# Patient Record
Sex: Male | Born: 1940
Health system: Southern US, Academic
[De-identification: ages and names within clinical notes are randomized; demographics above are authoritative.]

## PROBLEM LIST (undated history)

## (undated) ENCOUNTER — Ambulatory Visit

## (undated) ENCOUNTER — Encounter

## (undated) ENCOUNTER — Ambulatory Visit: Payer: MEDICARE

## (undated) ENCOUNTER — Encounter: Attending: Hematology & Oncology | Primary: Hematology & Oncology

## (undated) ENCOUNTER — Telehealth

## (undated) ENCOUNTER — Ambulatory Visit: Payer: MEDICARE | Attending: Hematology & Oncology | Primary: Hematology & Oncology

## (undated) ENCOUNTER — Telehealth: Attending: Hematology & Oncology | Primary: Hematology & Oncology

## (undated) ENCOUNTER — Telehealth: Attending: Internal Medicine | Primary: Internal Medicine

## (undated) ENCOUNTER — Encounter: Attending: Primary Care | Primary: Primary Care

## (undated) ENCOUNTER — Encounter: Attending: Nurse Practitioner | Primary: Nurse Practitioner

## (undated) ENCOUNTER — Ambulatory Visit: Payer: MEDICARE | Attending: Internal Medicine | Primary: Internal Medicine

## (undated) ENCOUNTER — Encounter: Attending: Acute Care | Primary: Acute Care

## (undated) ENCOUNTER — Encounter: Attending: Pulmonary Disease | Primary: Pulmonary Disease

## (undated) DIAGNOSIS — T4145XA Adverse effect of unspecified anesthetic, initial encounter: Secondary | ICD-10-CM

## (undated) DIAGNOSIS — D469 Myelodysplastic syndrome, unspecified: Secondary | ICD-10-CM

## (undated) DIAGNOSIS — C801 Malignant (primary) neoplasm, unspecified: Secondary | ICD-10-CM

## (undated) DIAGNOSIS — R911 Solitary pulmonary nodule: Secondary | ICD-10-CM

## (undated) DIAGNOSIS — K5790 Diverticulosis of intestine, part unspecified, without perforation or abscess without bleeding: Secondary | ICD-10-CM

## (undated) DIAGNOSIS — K4090 Unilateral inguinal hernia, without obstruction or gangrene, not specified as recurrent: Secondary | ICD-10-CM

## (undated) DIAGNOSIS — T07XXXA Unspecified multiple injuries, initial encounter: Secondary | ICD-10-CM

## (undated) DIAGNOSIS — K224 Dyskinesia of esophagus: Secondary | ICD-10-CM

## (undated) DIAGNOSIS — M4800 Spinal stenosis, site unspecified: Secondary | ICD-10-CM

## (undated) DIAGNOSIS — B002 Herpesviral gingivostomatitis and pharyngotonsillitis: Secondary | ICD-10-CM

## (undated) DIAGNOSIS — I5032 Chronic diastolic (congestive) heart failure: Secondary | ICD-10-CM

## (undated) DIAGNOSIS — G4733 Obstructive sleep apnea (adult) (pediatric): Secondary | ICD-10-CM

## (undated) DIAGNOSIS — T8859XA Other complications of anesthesia, initial encounter: Secondary | ICD-10-CM

## (undated) DIAGNOSIS — M1990 Unspecified osteoarthritis, unspecified site: Secondary | ICD-10-CM

## (undated) DIAGNOSIS — G629 Polyneuropathy, unspecified: Secondary | ICD-10-CM

## (undated) DIAGNOSIS — C9200 Acute myeloblastic leukemia, not having achieved remission: Secondary | ICD-10-CM

## (undated) DIAGNOSIS — I10 Essential (primary) hypertension: Secondary | ICD-10-CM

## (undated) DIAGNOSIS — J449 Chronic obstructive pulmonary disease, unspecified: Secondary | ICD-10-CM

## (undated) HISTORY — PX: OTHER SURGICAL HISTORY: SHX169

## (undated) HISTORY — PX: EYE SURGERY: SHX253

## (undated) HISTORY — DX: Solitary pulmonary nodule: R91.1

## (undated) HISTORY — PX: PILONIDAL CYST / SINUS EXCISION: SUR543

## (undated) HISTORY — DX: Obstructive sleep apnea (adult) (pediatric): G47.33

## (undated) HISTORY — DX: Unspecified multiple injuries, initial encounter: T07.XXXA

## (undated) HISTORY — DX: Chronic diastolic (congestive) heart failure: I50.32

---
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
## 1997-11-28 HISTORY — PX: OTHER SURGICAL HISTORY: SHX169

## 1998-05-14 ENCOUNTER — Ambulatory Visit (HOSPITAL_COMMUNITY): Admission: RE | Admit: 1998-05-14 | Discharge: 1998-05-14 | Payer: Self-pay | Admitting: Gastroenterology

## 1998-09-15 ENCOUNTER — Ambulatory Visit (HOSPITAL_COMMUNITY): Admission: RE | Admit: 1998-09-15 | Discharge: 1998-09-15 | Payer: Self-pay | Admitting: Gastroenterology

## 1999-05-21 ENCOUNTER — Ambulatory Visit: Admission: RE | Admit: 1999-05-21 | Discharge: 1999-05-21 | Payer: Self-pay | Admitting: Gastroenterology

## 1999-07-14 ENCOUNTER — Ambulatory Visit (HOSPITAL_COMMUNITY): Admission: RE | Admit: 1999-07-14 | Discharge: 1999-07-14 | Payer: Self-pay | Admitting: Gastroenterology

## 1999-07-14 ENCOUNTER — Encounter: Payer: Self-pay | Admitting: Gastroenterology

## 2000-09-26 ENCOUNTER — Other Ambulatory Visit: Admission: RE | Admit: 2000-09-26 | Discharge: 2000-09-26 | Payer: Self-pay | Admitting: Urology

## 2000-09-26 ENCOUNTER — Encounter (INDEPENDENT_AMBULATORY_CARE_PROVIDER_SITE_OTHER): Payer: Self-pay

## 2001-02-02 ENCOUNTER — Encounter: Admission: RE | Admit: 2001-02-02 | Discharge: 2001-02-02 | Payer: Self-pay | Admitting: Gastroenterology

## 2001-02-02 ENCOUNTER — Encounter: Payer: Self-pay | Admitting: Gastroenterology

## 2001-05-04 ENCOUNTER — Encounter: Payer: Self-pay | Admitting: Internal Medicine

## 2001-05-04 ENCOUNTER — Encounter: Admission: RE | Admit: 2001-05-04 | Discharge: 2001-05-04 | Payer: Self-pay | Admitting: Internal Medicine

## 2001-09-11 ENCOUNTER — Ambulatory Visit (HOSPITAL_COMMUNITY): Admission: RE | Admit: 2001-09-11 | Discharge: 2001-09-11 | Payer: Self-pay | Admitting: *Deleted

## 2001-10-29 ENCOUNTER — Encounter: Payer: Self-pay | Admitting: Internal Medicine

## 2001-10-29 ENCOUNTER — Encounter: Admission: RE | Admit: 2001-10-29 | Discharge: 2001-10-29 | Payer: Self-pay | Admitting: Internal Medicine

## 2002-01-01 ENCOUNTER — Encounter: Admission: RE | Admit: 2002-01-01 | Discharge: 2002-01-01 | Payer: Self-pay | Admitting: Gastroenterology

## 2002-01-01 ENCOUNTER — Encounter: Payer: Self-pay | Admitting: Gastroenterology

## 2003-08-06 ENCOUNTER — Ambulatory Visit (HOSPITAL_COMMUNITY): Admission: RE | Admit: 2003-08-06 | Discharge: 2003-08-06 | Payer: Self-pay | Admitting: Gastroenterology

## 2003-08-06 ENCOUNTER — Encounter: Payer: Self-pay | Admitting: Gastroenterology

## 2005-05-03 ENCOUNTER — Ambulatory Visit (HOSPITAL_BASED_OUTPATIENT_CLINIC_OR_DEPARTMENT_OTHER): Admission: RE | Admit: 2005-05-03 | Discharge: 2005-05-03 | Payer: Self-pay | Admitting: Gastroenterology

## 2005-05-08 ENCOUNTER — Ambulatory Visit: Payer: Self-pay | Admitting: Internal Medicine

## 2005-05-10 ENCOUNTER — Ambulatory Visit: Payer: Self-pay | Admitting: Internal Medicine

## 2005-06-14 ENCOUNTER — Ambulatory Visit: Payer: Self-pay | Admitting: Internal Medicine

## 2005-10-12 ENCOUNTER — Ambulatory Visit: Payer: Self-pay | Admitting: Internal Medicine

## 2005-10-17 ENCOUNTER — Ambulatory Visit: Payer: Self-pay | Admitting: Cardiology

## 2006-03-29 ENCOUNTER — Encounter: Payer: Self-pay | Admitting: Gastroenterology

## 2006-04-19 ENCOUNTER — Encounter: Admission: RE | Admit: 2006-04-19 | Discharge: 2006-04-19 | Payer: Self-pay | Admitting: Gastroenterology

## 2006-10-10 ENCOUNTER — Ambulatory Visit: Payer: Self-pay | Admitting: Internal Medicine

## 2007-10-10 ENCOUNTER — Ambulatory Visit (HOSPITAL_COMMUNITY): Admission: RE | Admit: 2007-10-10 | Discharge: 2007-10-10 | Payer: Self-pay | Admitting: Internal Medicine

## 2007-10-10 ENCOUNTER — Ambulatory Visit: Payer: Self-pay | Admitting: Internal Medicine

## 2008-10-07 DIAGNOSIS — G4733 Obstructive sleep apnea (adult) (pediatric): Secondary | ICD-10-CM | POA: Insufficient documentation

## 2008-10-07 DIAGNOSIS — J984 Other disorders of lung: Secondary | ICD-10-CM

## 2008-10-08 ENCOUNTER — Ambulatory Visit: Payer: Self-pay | Admitting: Internal Medicine

## 2009-10-08 ENCOUNTER — Ambulatory Visit: Payer: Self-pay | Admitting: Internal Medicine

## 2009-10-13 ENCOUNTER — Ambulatory Visit (HOSPITAL_COMMUNITY): Admission: RE | Admit: 2009-10-13 | Discharge: 2009-10-13 | Payer: Self-pay | Admitting: Internal Medicine

## 2009-10-15 ENCOUNTER — Telehealth: Payer: Self-pay | Admitting: Internal Medicine

## 2009-10-15 ENCOUNTER — Encounter: Payer: Self-pay | Admitting: Internal Medicine

## 2009-10-16 ENCOUNTER — Telehealth: Payer: Self-pay | Admitting: Internal Medicine

## 2009-10-20 ENCOUNTER — Telehealth: Payer: Self-pay | Admitting: Internal Medicine

## 2010-10-06 ENCOUNTER — Ambulatory Visit: Payer: Self-pay | Admitting: Internal Medicine

## 2010-10-06 DIAGNOSIS — J209 Acute bronchitis, unspecified: Secondary | ICD-10-CM | POA: Insufficient documentation

## 2010-10-28 ENCOUNTER — Ambulatory Visit: Payer: Self-pay | Admitting: Internal Medicine

## 2010-12-19 ENCOUNTER — Encounter: Payer: Self-pay | Admitting: Gastroenterology

## 2010-12-28 NOTE — Miscellaneous (Signed)
Summary: Orders Update pft charges  Clinical Lists Changes  Orders: Added new Service order of Carbon Monoxide diffusing w/capacity (94720) - Signed Added new Service order of Lung Volumes (94240) - Signed Added new Service order of Spirometry (Pre & Post) (94060) - Signed 

## 2010-12-28 NOTE — Assessment & Plan Note (Signed)
Summary: 12 MONTHS/APC   Primary Provider/Referring Provider:  K. Husain  CC:  1 yr f/u - Wears cpap only with travel.  History of Present Illness: 10/08/08-OSA, thoracic kyphosis, lung nodule hates cpap- not using. Gets vasomotor rhinitis from mask air flow.[name] Keeps weight lower and wife says less snoring.. Original AHI 19. Tried oral appliance. Having C-spine nerve root problem R hand. Had lung nodule on CT 2006. Followed since w/out change on cxr.  October 08, 2009- OSA, hx lung nodule followed since 2006. Thoracic kyphosis Walks 3 miles 5 days/ week in woods and credits this for weight loss.  Wife still complains of snore and he will go downstairs to sleep, using cpap only if they are travelling and sharing a motel room. Wakes sometimes with dry mouth and we discussed mouth breathing during sleep. CXR- no evident nodule but he had a heavy cigarette pack-year hx and father died of lung cancer. had flu vax  October 06, 2010- OSA, hx lung nodule followed since 2006. Thoracic kyphosis Nurse-CC: 1 yr f/u - Wears cpap only with travel Back not different, but he is more aware of sense that his chest feels encased or restricted some.  We discussed his degenerative disk disease and hx of restriction from kyphosis. Denies cough or wheeze. Does sometimes have morning cough with thick mucus plugs.  Only uses CPAP with trravel when he needs to share a bedroom.  CT- 10/13/09- stable small nodules, stable distal esophageal thickening.   Preventive Screening-Counseling & Management  Alcohol-Tobacco     Smoking Status: quit     Packs/Day: 1.0     Year Quit: 1997     Pack years: 40  Current Medications (verified): 1)  Cpap 8 Advanced 2)  Aspir-Low 81 Mg Tbec (Aspirin) .... Take One By Mouth Once Daily  Allergies (verified): 1)  ! Versed  Social History: Packs/Day:  1.0 Pack years:  40  Review of Systems      See HPI       The patient complains of shortness of breath with activity and  productive cough.  The patient denies shortness of breath at rest, non-productive cough, coughing up blood, chest pain, irregular heartbeats, acid heartburn, indigestion, loss of appetite, weight change, abdominal pain, difficulty swallowing, sore throat, tooth/dental problems, headaches, nasal congestion/difficulty breathing through nose, sneezing, itching, rash, change in color of mucus, and fever.    Vital Signs:  Patient profile:   70 year old male Height:      68 inches Weight:      166.25 pounds BMI:     25.37 O2 Sat:      98 % on Room air Pulse rate:   60 / minute BP sitting:   122 / 62  (left arm) Cuff size:   regular  Vitals Entered By: Abigail Miyamoto RN (October 06, 2010 9:56 AM)  O2 Flow:  Room air  Physical Exam  Additional Exam:  General: A/Ox3; pleasant and cooperative, NAD, trim SKIN: no rash, lesions NODES: no lymphadenopathy HEENT: [place]/AT, EOM- WNL, Conjuctivae- clear, PERRLA, TM-WNL, Nose- clear, Throat- clear and wnl, Mallampati II NECK: Supple w/ fair ROM, JVD- none, normal carotid impulses w/o bruits Thyroid-  CHEST: Clear to P&A, HEART: RRR, no m/g/r heard ABDOMEN: Soft and nl; GMW:NUUV, nl pulses, no edema  NEURO: Grossly intact to observation      Impression & Recommendations:  Problem # 1:  OBSTRUCTIVE SLEEP APNEA (ICD-327.23)  Likely minimal. We discussed potential role of PAP devices for  assist to work of breathing  if his restrictive problems ever required.   Problem # 2:  PULMONARY NODULE (ICD-518.89)  Stable by CT with no concern.  This didn't address new problem dypnea and I will get PFT but suspect he is getting some stiffening of rib excursion.l \\Had  flu vax. He does have an old incentive spirometer.   Medications Added to Medication List This Visit: 1)  Aspir-low 81 Mg Tbec (Aspirin) .... Take one by mouth once daily  Other Orders: Est. Patient Level III (16109) Misc. Referral (Misc. Ref)  Patient Instructions: 1)  Please schedule  a follow-up appointment in 1 year. 2)  cc Dr Donette Larry 3)  Schedule PFT   Influenza Immunization History:    Influenza # 1:  Historical (08/11/2010)  Pneumovax Immunization History:    Pneumovax # 1:  Historical (10/10/2007)

## 2011-04-12 NOTE — Assessment & Plan Note (Signed)
Warren [name]                             PULMONARY OFFICE NOTE   Warren [name], Warren [name]                        MRN:          [id number]  DATE:10/10/2007                            DOB:          [birth year]    PULMONARY FOLLOWUP   PROBLEM LIST:  1. Obstructive sleep apnea/hypopnea.  2. History of lung nodules.  3. History of multiple trauma.   HISTORY:  A one-year followup.  He uses CPAP in frequently because the  mask irritates and seems to increase rhinorrhea.  He had tried an oral  appliance at first, but could not make it work.  He exercises regularly  and feels well, noticing stable mild early morning cough, which seems  trivial by description.  Has had flu vaccine.  No fevers, sweats, or  adenopathy.   MEDICATIONS:  1. Aspirin.  2. CPAP at 8 CWP.  3. Occasional Flonase.   DRUG INTOLERANCES:  VERSED.   OBJECTIVE:  Weight 179 pounds, BP 112/64, pulse 68, room air saturation  99%.  He looks very well.  Nasal airway is unobstructed.  There is no neck vein distension, thyromegaly, or stridor.  HEART:  Sounds are regular without murmur.  CHEST:  Clear.   IMPRESSION:  Very minimal chronic bronchitis causing some morning cough,  not enough to treat.  Lung nodule has been stable.  Obstructive sleep  apnea is minor.  He is keeping his weight down and does not seem to be  experiencing significant snoring or daytime sleepiness.  We discussed  this.  He is going to ask his wife more pointedly about her observation  of his sleep apnea.   PLAN:  1. We will try reducing CPAP to 7 CWP for comfort.  2. Chest x-ray.  3. Schedule return in 1 year.   ADDENDUM:  Chest x-ray report of October 10, 2007 returns for  comparison to his CT scan of October 17, 2005 describing mild thoracic  kyphosis, which is stable, with clear lungs, normal heart size,  postoperative change to the left shoulder.  No acute disease.     Clinton D. Maple Hudson, MD, [name], FACP  Electronically Signed    CDY/MedQ  DD: 10/14/2007  DT: 10/15/2007  Job #: [id number]   cc:   Tasia Catchings, M.D.

## 2011-04-15 NOTE — Procedures (Signed)
[place]  Patient:    [name], Warren Visit Number: [id number] MRN: [id number]          Service Type: END Location: ENDO Attending Physician:  Mingo Amber Dictated by:   Roosvelt Harps, M.D. Proc. Date: 09/11/01 Admit Date:  09/11/2001 Discharge Date: 09/11/2001   CC:         Genene Churn. Sherin Quarry, M.D.                           Procedure Report  PROCEDURE:  Anorectal manometry.  INDICATION:  Fecal incontinence.  FINDINGS:  Preprocedure examination revealed a normal perineum and digital rectal exam.  Patient appropriately contracted the sphincter when instructed. The anorectal manometry probe was placed in the rectum and pressure measurements taken on station withdrawal.  The maximal average resting tone reflective of internal sphincter pressure occurred 3 cm from the anal verge and was 95.5 mmHg, which is normal.  The maximal voluntary contraction pressure, which is reflective of the external sphincter, occurred 3 cm from the anal verge and was 399 mmHg, which is normal.  The patient was able to maintain a contraction voluntarily for 28.1 seconds, which was normal.  The sphincter length was 4 cm, which is normal.  The sphincter vectorgrams demonstrated essentially symmetric resting and squeeze pressures.  The rectoanoinhibitory reflex was present.  The patients threshold volume of sensation was 15 cc in the rectal balloon, which is normal, the cut-off being 20 cc.  He experienced his first urge to defecate at 150 cc, which is borderline, but he tolerated 250 cc in the rectal balloon.  IMPRESSION:  Essentially normal anorectal manometry.  The patients symptoms could be due to incomplete defecation or spastic, irritable bowel. Dictated by:   Roosvelt Harps, M.D. Attending Physician:  Mingo Amber DD:  09/24/01 TD:  09/25/01 Job: [id number]

## 2011-04-15 NOTE — Assessment & Plan Note (Signed)
 [facility]                               PULMONARY OFFICE NOTE   Warren [name], Warren [name]                        MRN:          [id number]  DATE:10/10/2006                            DOB:          11/13/1941    PULMONARY/SLEEP FOLLOWUP   PROBLEMS:  1. Obstructive sleep apnea/hypopnea.  2. History of lung nodules.  3. History of multiple trauma.   HISTORY:  He returns for 1-year followup, having done quite well.  He goes  on wilderness camping trips and mentions that [place] at [place]  altitude of 8500 feet, he developed a cough with exhalation that was enough  to wake him at night.  That resolved as he returned home.  He thought the  altitude was the problem but it sounds as if cigars and campfire smoke had  more to do with it.  Since then, he has been fine.  He continues without  problems, using CPAP at 8 CWP.  Has had flu shot.   MEDICATION:  1. Flonase used occasionally when needed.  2. CPAP at 8 CWP.   Drug intolerant of VERSED.   OBJECTIVE:  Weight 172 pounds, BP 112/74, pulse regular at 55, room air  saturation 98%.  He looks quite comfortable.  Nasal airway and chest are  clear.  Voice quality is normal with no stridor or neck vein distention.  Heart sounds are regular without murmur.  I find no adenopathy at the neck  or supraclavicular areas, no cyanosis or clubbing.  Chest x-ray done today  was reviewed before sent for over-read.  I see no nodules, an issue in the  past.  No active disease.   IMPRESSION:  1. Mild bronchitis on trip, resolved.  2. Stable obstructive sleep apnea.  3. Past history of lung nodule seen on chest CT most recently November      2006 and described as a stable right apical nodule with no change over      the past 4 years compatible with a benign entity such as an uncalcified      granuloma.  Two low attenuation lesions were in the liver, likely to be      cysts.  Spirometry from November 2006 showed  mild obstructive disease      with an FEV1/FVC ratio of 0.67 and slowing primarily in small airways.      There was some response to bronchodilator suggesting an asthma or      asthmatic bronchitis pattern.   PLAN:  1. We can provide a rescue albuterol inhaler before he next goes camping.  2. Avoid obvious respiratory irritants.  3. Continue CPAP.  4. Schedule return 1 year, earlier p.r.n.     Clinton D. Maple Hudson, MD, [name], FACP  Electronically Signed    CDY/MedQ  DD: 10/10/2006  DT: 10/11/2006  Job #: [id number]   cc:   Tasia Catchings, M.D.

## 2011-04-15 NOTE — Procedures (Signed)
NAME:  Warren [name], Warren [name]                 ACCOUNT NO.:  [id number]   MEDICAL RECORD NO.:  [id number]          PATIENT TYPE:  OUT   LOCATION:  SLEEP CENTER                 FACILITY:  [facility]   PHYSICIAN:  Clinton D. Maple Hudson, M.D. DATE OF BIRTH:  March 03, 1941   DATE OF STUDY:  05/03/2005                              NOCTURNAL POLYSOMNOGRAM   REFERRING PHYSICIAN:  Dr. Tasia Catchings   DATE OF STUDY:  May 03, 2005   INDICATION FOR STUDY:  Hypersomnia with sleep apnea.  Epworth Sleepiness  Score 8/24, BMI 25, weight 167 pounds.  A diagnostic NPSG on May 21, 1999  gave an RDI of 19 per hour.  He had been treated with an oral appliance.   SLEEP ARCHITECTURE:  Total sleep time 373 minutes with sleep efficiency 83%.  Stage I was 16%, stage II 58%, stages III and IV absent, REM 25% of total  sleep time.  Sleep latency 28 minutes, REM latency 81 minutes, awake after  sleep onset 49 minutes, arousal index 21.  No bedtime medications taken.   RESPIRATORY DATA:  Split-study protocol.  Respiratory disturbance index  (RDI, AHI) 34.4 obstructive events per hour indicating moderately severe  obstructive sleep apnea/hypopnea syndrome.  There were 59 obstructive apneas  and 36 hypopneas.  Events were not positional.  REM RDI 18.  The  technician's notes do not indicate that he was wearing his oral appliance.  CPAP was titrated to 9 CWP, RDI 1.8 per hour, using a medium Respironics  ComfortGel Nasal Mask with heated humidifier.  Control of events was  obtained at lower pressures but titration continued to 9 CWP because of  snoring and associated arousals.   OXYGEN DATA:  Moderate snoring with oxygen desaturation to a nadir of 85%  before CPAP.  After CPAP control saturation held 94-98% on room air.   CARDIAC DATA:  Normal sinus rhythm with occasional PVC.   MOVEMENT/PARASOMNIA:  A total of 254 limb jerks were recorded of which 17  were associated with arousal or awakening for a periodic limb movement with  arousal index of 2.7 per hour which is mildly increased.  This can be  reconsidered clinically after therapeutic control of sleep apnea.   IMPRESSION/RECOMMENDATION:  1.  Moderately severe obstructive sleep apnea/hypopnea syndrome, respiratory      disturbance index 34.4 per hour with moderate snoring and oxygen      desaturation to 85%.  2.  Successful continuous positive airway pressure titration to 9 CWP,      respiratory disturbance index 1.8 per hour,      using a medium Respironics ComfortGel Nasal Mask with heated humidifier.  3.  Frequent leg jerks with mild periodic limb movement with arousal      syndrome, 2.7 per hour.      Clinton D. Maple Hudson, M.D.  Diplomat    CDY/MEDQ  D:  05/08/2005 11:16:06  T:  05/08/2005 13:56:56  Job:  [id number]

## 2011-07-13 ENCOUNTER — Other Ambulatory Visit: Payer: Self-pay | Admitting: Internal Medicine

## 2011-07-13 DIAGNOSIS — M5412 Radiculopathy, cervical region: Secondary | ICD-10-CM

## 2011-07-21 ENCOUNTER — Other Ambulatory Visit: Payer: Self-pay

## 2011-08-09 ENCOUNTER — Ambulatory Visit
Admission: RE | Admit: 2011-08-09 | Discharge: 2011-08-09 | Disposition: A | Discharge status: Home / Self Care | Payer: Medicare Other | Source: Ambulatory Visit | Attending: Internal Medicine | Admitting: Internal Medicine

## 2011-08-09 DIAGNOSIS — M5412 Radiculopathy, cervical region: Secondary | ICD-10-CM

## 2011-08-10 ENCOUNTER — Other Ambulatory Visit: Payer: Self-pay

## 2011-08-24 ENCOUNTER — Ambulatory Visit: Payer: Medicare Other | Attending: Internal Medicine

## 2011-08-24 DIAGNOSIS — M25619 Stiffness of unspecified shoulder, not elsewhere classified: Secondary | ICD-10-CM | POA: Insufficient documentation

## 2011-08-24 DIAGNOSIS — R5381 Other malaise: Secondary | ICD-10-CM | POA: Insufficient documentation

## 2011-08-24 DIAGNOSIS — IMO0001 Reserved for inherently not codable concepts without codable children: Secondary | ICD-10-CM | POA: Insufficient documentation

## 2011-08-24 DIAGNOSIS — M6281 Muscle weakness (generalized): Secondary | ICD-10-CM | POA: Insufficient documentation

## 2011-08-24 DIAGNOSIS — R293 Abnormal posture: Secondary | ICD-10-CM | POA: Insufficient documentation

## 2011-09-01 ENCOUNTER — Ambulatory Visit: Payer: Medicare Other | Attending: Internal Medicine

## 2011-09-01 DIAGNOSIS — IMO0001 Reserved for inherently not codable concepts without codable children: Secondary | ICD-10-CM | POA: Insufficient documentation

## 2011-09-01 DIAGNOSIS — R293 Abnormal posture: Secondary | ICD-10-CM | POA: Insufficient documentation

## 2011-09-01 DIAGNOSIS — M25619 Stiffness of unspecified shoulder, not elsewhere classified: Secondary | ICD-10-CM | POA: Insufficient documentation

## 2011-09-01 DIAGNOSIS — M6281 Muscle weakness (generalized): Secondary | ICD-10-CM | POA: Insufficient documentation

## 2011-09-01 DIAGNOSIS — R5381 Other malaise: Secondary | ICD-10-CM | POA: Insufficient documentation

## 2011-09-06 ENCOUNTER — Ambulatory Visit: Payer: Medicare Other

## 2011-09-15 ENCOUNTER — Ambulatory Visit: Payer: Medicare Other

## 2011-09-27 ENCOUNTER — Encounter: Payer: Medicare Other | Admitting: Physical Therapy

## 2011-10-04 ENCOUNTER — Encounter: Payer: Self-pay | Admitting: Internal Medicine

## 2011-10-05 ENCOUNTER — Ambulatory Visit: Payer: Medicare Other | Admitting: Internal Medicine

## 2011-12-21 DIAGNOSIS — M159 Polyosteoarthritis, unspecified: Secondary | ICD-10-CM | POA: Diagnosis not present

## 2011-12-21 DIAGNOSIS — M67919 Unspecified disorder of synovium and tendon, unspecified shoulder: Secondary | ICD-10-CM | POA: Diagnosis not present

## 2011-12-21 DIAGNOSIS — M25519 Pain in unspecified shoulder: Secondary | ICD-10-CM | POA: Diagnosis not present

## 2012-01-03 DIAGNOSIS — M67919 Unspecified disorder of synovium and tendon, unspecified shoulder: Secondary | ICD-10-CM | POA: Diagnosis not present

## 2012-01-03 DIAGNOSIS — M719 Bursopathy, unspecified: Secondary | ICD-10-CM | POA: Diagnosis not present

## 2012-01-05 DIAGNOSIS — M719 Bursopathy, unspecified: Secondary | ICD-10-CM | POA: Diagnosis not present

## 2012-01-06 DIAGNOSIS — M67919 Unspecified disorder of synovium and tendon, unspecified shoulder: Secondary | ICD-10-CM | POA: Diagnosis not present

## 2012-01-11 DIAGNOSIS — M67919 Unspecified disorder of synovium and tendon, unspecified shoulder: Secondary | ICD-10-CM | POA: Diagnosis not present

## 2012-01-13 DIAGNOSIS — M719 Bursopathy, unspecified: Secondary | ICD-10-CM | POA: Diagnosis not present

## 2012-01-13 DIAGNOSIS — M67919 Unspecified disorder of synovium and tendon, unspecified shoulder: Secondary | ICD-10-CM | POA: Diagnosis not present

## 2012-01-16 DIAGNOSIS — M719 Bursopathy, unspecified: Secondary | ICD-10-CM | POA: Diagnosis not present

## 2012-01-17 DIAGNOSIS — S93499A Sprain of other ligament of unspecified ankle, initial encounter: Secondary | ICD-10-CM | POA: Diagnosis not present

## 2012-01-20 DIAGNOSIS — M67919 Unspecified disorder of synovium and tendon, unspecified shoulder: Secondary | ICD-10-CM | POA: Diagnosis not present

## 2012-01-20 DIAGNOSIS — M719 Bursopathy, unspecified: Secondary | ICD-10-CM | POA: Diagnosis not present

## 2012-01-21 DIAGNOSIS — J069 Acute upper respiratory infection, unspecified: Secondary | ICD-10-CM | POA: Diagnosis not present

## 2012-01-21 DIAGNOSIS — K5289 Other specified noninfective gastroenteritis and colitis: Secondary | ICD-10-CM | POA: Diagnosis not present

## 2012-01-25 DIAGNOSIS — M719 Bursopathy, unspecified: Secondary | ICD-10-CM | POA: Diagnosis not present

## 2012-02-08 DIAGNOSIS — L821 Other seborrheic keratosis: Secondary | ICD-10-CM | POA: Diagnosis not present

## 2012-02-08 DIAGNOSIS — D239 Other benign neoplasm of skin, unspecified: Secondary | ICD-10-CM | POA: Diagnosis not present

## 2012-02-08 DIAGNOSIS — L819 Disorder of pigmentation, unspecified: Secondary | ICD-10-CM | POA: Diagnosis not present

## 2012-02-08 DIAGNOSIS — Z85820 Personal history of malignant melanoma of skin: Secondary | ICD-10-CM | POA: Diagnosis not present

## 2012-04-04 DIAGNOSIS — L821 Other seborrheic keratosis: Secondary | ICD-10-CM | POA: Diagnosis not present

## 2012-04-27 DIAGNOSIS — H6590 Unspecified nonsuppurative otitis media, unspecified ear: Secondary | ICD-10-CM | POA: Diagnosis not present

## 2012-04-27 DIAGNOSIS — J029 Acute pharyngitis, unspecified: Secondary | ICD-10-CM | POA: Diagnosis not present

## 2012-05-16 DIAGNOSIS — Z85820 Personal history of malignant melanoma of skin: Secondary | ICD-10-CM | POA: Diagnosis not present

## 2012-05-16 DIAGNOSIS — L578 Other skin changes due to chronic exposure to nonionizing radiation: Secondary | ICD-10-CM | POA: Diagnosis not present

## 2012-05-16 DIAGNOSIS — L821 Other seborrheic keratosis: Secondary | ICD-10-CM | POA: Diagnosis not present

## 2012-05-16 DIAGNOSIS — D239 Other benign neoplasm of skin, unspecified: Secondary | ICD-10-CM | POA: Diagnosis not present

## 2012-05-24 DIAGNOSIS — M19049 Primary osteoarthritis, unspecified hand: Secondary | ICD-10-CM | POA: Diagnosis not present

## 2012-05-25 DIAGNOSIS — H02409 Unspecified ptosis of unspecified eyelid: Secondary | ICD-10-CM | POA: Diagnosis not present

## 2012-05-25 DIAGNOSIS — H524 Presbyopia: Secondary | ICD-10-CM | POA: Diagnosis not present

## 2012-05-25 DIAGNOSIS — H52209 Unspecified astigmatism, unspecified eye: Secondary | ICD-10-CM | POA: Diagnosis not present

## 2012-05-25 DIAGNOSIS — H2510 Age-related nuclear cataract, unspecified eye: Secondary | ICD-10-CM | POA: Diagnosis not present

## 2012-07-12 DIAGNOSIS — M19049 Primary osteoarthritis, unspecified hand: Secondary | ICD-10-CM | POA: Diagnosis not present

## 2012-07-18 DIAGNOSIS — Z0000 Encounter for general adult medical examination without abnormal findings: Secondary | ICD-10-CM | POA: Diagnosis not present

## 2012-07-18 DIAGNOSIS — C61 Malignant neoplasm of prostate: Secondary | ICD-10-CM | POA: Diagnosis not present

## 2012-07-18 DIAGNOSIS — Z1331 Encounter for screening for depression: Secondary | ICD-10-CM | POA: Diagnosis not present

## 2012-07-18 DIAGNOSIS — K5730 Diverticulosis of large intestine without perforation or abscess without bleeding: Secondary | ICD-10-CM | POA: Diagnosis not present

## 2012-07-25 DIAGNOSIS — Z8546 Personal history of malignant neoplasm of prostate: Secondary | ICD-10-CM | POA: Diagnosis not present

## 2012-07-25 DIAGNOSIS — N529 Male erectile dysfunction, unspecified: Secondary | ICD-10-CM | POA: Diagnosis not present

## 2012-07-27 DIAGNOSIS — H01009 Unspecified blepharitis unspecified eye, unspecified eyelid: Secondary | ICD-10-CM | POA: Diagnosis not present

## 2012-08-06 DIAGNOSIS — Z1211 Encounter for screening for malignant neoplasm of colon: Secondary | ICD-10-CM | POA: Diagnosis not present

## 2012-09-05 DIAGNOSIS — Z23 Encounter for immunization: Secondary | ICD-10-CM | POA: Diagnosis not present

## 2012-09-06 DIAGNOSIS — M19049 Primary osteoarthritis, unspecified hand: Secondary | ICD-10-CM | POA: Diagnosis not present

## 2012-09-11 ENCOUNTER — Encounter: Payer: Self-pay | Admitting: Internal Medicine

## 2012-09-11 ENCOUNTER — Ambulatory Visit (INDEPENDENT_AMBULATORY_CARE_PROVIDER_SITE_OTHER): Payer: Medicare Other | Admitting: Internal Medicine

## 2012-09-11 VITALS — BP 122/76 | HR 70 | Ht 68.0 in | Wt 167.6 lb

## 2012-09-11 DIAGNOSIS — G4733 Obstructive sleep apnea (adult) (pediatric): Secondary | ICD-10-CM

## 2012-09-11 DIAGNOSIS — J984 Other disorders of lung: Secondary | ICD-10-CM | POA: Diagnosis not present

## 2012-09-11 DIAGNOSIS — R918 Other nonspecific abnormal finding of lung field: Secondary | ICD-10-CM | POA: Diagnosis not present

## 2012-09-11 DIAGNOSIS — R0602 Shortness of breath: Secondary | ICD-10-CM

## 2012-09-11 NOTE — Patient Instructions (Addendum)
Order noncontrast CT chest  Dx lung nodules

## 2012-09-11 NOTE — Progress Notes (Signed)
09/11/12- 71 yoM former smoker, followed for OSA, Hx lung nodule, hx dyspnea  Son is pulmonologist  still has slight SOB at times; wears CPAP8when traveling to prevent snoring. LOV-10/06/10 Has had flu vaccine. Stable dyspnea on exertion. Did  very well on a wilderness trip altitude and horse riding. He wanted to talk about screening CT scan for lung cancer. Father died of lung cancer. PFT 10-31-2010: Mild obstructive airways disease in small airways with insignificant response to bronchodilator. Increased diffusion capacity raised question of increased blood flow. FEV1 3.00/110%, FEV1/FVC 0.61, FEF 25-75% 1.23/49%. TLC 117%, DLCO 167%. CT chest 10/13/09 compared w/ 2006: IMPRESSION:  1. Stable small lung nodules.  2. No mediastinal or hilar adenopathy.  3. Stable hepatic cysts.  4. Mild distal esophageal wall thickening is a stable finding.  Provider: Niel Hummer, Amy Rogal   ROS-see HPI Constitutional:   No-   weight loss, night sweats, fevers, chills, fatigue, lassitude. HEENT:   No-  headaches, difficulty swallowing, tooth/dental problems, sore throat,       No-  sneezing, itching, ear ache, nasal congestion, post nasal drip,  CV:  No-   chest pain, orthopnea, PND, swelling in lower extremities, anasarca,  dizziness, palpitations Resp: + Little shortness of breath with exertion or at rest.              No-   productive cough,  No non-productive cough,  No- coughing up of blood.              No-   change in color of mucus.  No- wheezing.   Skin: No-   rash or lesions. GI:  No-   heartburn, indigestion, abdominal pain, nausea, vomiting,  GU:  MS:  No-   joint pain or swelling.   Neuro-     nothing unusual Psych:  No- change in mood or affect. No depression or anxiety.  No memory loss.  OBJ- Physical Exam General- Alert, Oriented, Affect-appropriate, Distress- none acute, trim Skin- rash-none, lesions- none, excoriation- none Lymphadenopathy- none Head- atraumatic            Eyes-  Gross vision intact, PERRLA, conjunctivae and secretions clear            Ears- Hearing, canals-normal            Nose- Clear, no-Septal dev, mucus, polyps, erosion, perforation             Throat- Mallampati II , mucosa clear , drainage- none, tonsils- atrophic Neck- flexible , trachea midline, no stridor , thyroid nl, carotid no bruit Chest - symmetrical excursion , unlabored           Heart/CV- RRR , no murmur , no gallop  , no rub, nl s1 s2                           - JVD- none , edema- none, stasis changes- none, varices- none           Lung- clear to P&A, wheeze- none, cough- none , dullness-none, rub- none           Chest wall- + mild scoliosis Abd- tender-no, distended-no, bowel sounds-present, HSM- no Br/ Gen/ Rectal- Not done, not indicated Extrem- cyanosis- none, clubbing, none, atrophy- none, strength- nl Neuro- grossly intact to observation

## 2012-09-13 DIAGNOSIS — M25559 Pain in unspecified hip: Secondary | ICD-10-CM | POA: Diagnosis not present

## 2012-09-13 DIAGNOSIS — M545 Low back pain: Secondary | ICD-10-CM | POA: Diagnosis not present

## 2012-09-18 ENCOUNTER — Ambulatory Visit (INDEPENDENT_AMBULATORY_CARE_PROVIDER_SITE_OTHER)
Admission: RE | Admit: 2012-09-18 | Discharge: 2012-09-18 | Disposition: A | Discharge status: Home / Self Care | Payer: Medicare Other | Source: Ambulatory Visit | Attending: Internal Medicine | Admitting: Internal Medicine

## 2012-09-18 DIAGNOSIS — R918 Other nonspecific abnormal finding of lung field: Secondary | ICD-10-CM | POA: Diagnosis not present

## 2012-09-20 ENCOUNTER — Telehealth: Payer: Self-pay | Admitting: Internal Medicine

## 2012-09-20 NOTE — Assessment & Plan Note (Signed)
He has used CPAP appropriately and has had some counseling from his son, a pulmonologist

## 2012-09-20 NOTE — Telephone Encounter (Signed)
I spoke with patient about results and he verbalized understanding and had no questions 

## 2012-09-20 NOTE — Assessment & Plan Note (Signed)
He had no discomfort riding horses on a high altitude wilderness trip and oxygenation is good now. I don't think this is much of a concern.

## 2012-09-20 NOTE — Assessment & Plan Note (Signed)
Former smoker with history of lung nodule. Plan-CT chest

## 2012-10-10 DIAGNOSIS — D692 Other nonthrombocytopenic purpura: Secondary | ICD-10-CM | POA: Diagnosis not present

## 2012-10-10 DIAGNOSIS — Z85820 Personal history of malignant melanoma of skin: Secondary | ICD-10-CM | POA: Diagnosis not present

## 2012-10-10 DIAGNOSIS — D2330 Other benign neoplasm of skin of unspecified part of face: Secondary | ICD-10-CM | POA: Diagnosis not present

## 2012-10-10 DIAGNOSIS — L821 Other seborrheic keratosis: Secondary | ICD-10-CM | POA: Diagnosis not present

## 2012-10-10 DIAGNOSIS — D239 Other benign neoplasm of skin, unspecified: Secondary | ICD-10-CM | POA: Diagnosis not present

## 2012-10-10 DIAGNOSIS — L723 Sebaceous cyst: Secondary | ICD-10-CM | POA: Diagnosis not present

## 2012-10-10 DIAGNOSIS — B351 Tinea unguium: Secondary | ICD-10-CM | POA: Diagnosis not present

## 2012-10-10 DIAGNOSIS — D1801 Hemangioma of skin and subcutaneous tissue: Secondary | ICD-10-CM | POA: Diagnosis not present

## 2012-10-18 DIAGNOSIS — M19049 Primary osteoarthritis, unspecified hand: Secondary | ICD-10-CM | POA: Diagnosis not present

## 2012-12-04 DIAGNOSIS — M159 Polyosteoarthritis, unspecified: Secondary | ICD-10-CM | POA: Diagnosis not present

## 2013-01-05 DIAGNOSIS — L0390 Cellulitis, unspecified: Secondary | ICD-10-CM | POA: Diagnosis not present

## 2013-01-05 DIAGNOSIS — L0291 Cutaneous abscess, unspecified: Secondary | ICD-10-CM | POA: Diagnosis not present

## 2013-01-05 DIAGNOSIS — B009 Herpesviral infection, unspecified: Secondary | ICD-10-CM | POA: Diagnosis not present

## 2013-01-14 DIAGNOSIS — L0291 Cutaneous abscess, unspecified: Secondary | ICD-10-CM | POA: Diagnosis not present

## 2013-01-30 DIAGNOSIS — R1011 Right upper quadrant pain: Secondary | ICD-10-CM | POA: Diagnosis not present

## 2013-02-27 ENCOUNTER — Other Ambulatory Visit: Payer: Self-pay | Admitting: Internal Medicine

## 2013-02-27 DIAGNOSIS — R229 Localized swelling, mass and lump, unspecified: Secondary | ICD-10-CM

## 2013-02-28 ENCOUNTER — Ambulatory Visit
Admission: RE | Admit: 2013-02-28 | Discharge: 2013-02-28 | Disposition: A | Discharge status: Home / Self Care | Payer: Medicare Other | Source: Ambulatory Visit | Attending: Internal Medicine | Admitting: Internal Medicine

## 2013-02-28 DIAGNOSIS — J438 Other emphysema: Secondary | ICD-10-CM | POA: Diagnosis not present

## 2013-02-28 DIAGNOSIS — R229 Localized swelling, mass and lump, unspecified: Secondary | ICD-10-CM

## 2013-02-28 MED ORDER — IOHEXOL 300 MG/ML  SOLN
75.0000 mL | Freq: Once | INTRAMUSCULAR | Status: AC | PRN
  Administered 2013-02-28: 75 mL via INTRAVENOUS

## 2013-03-22 DIAGNOSIS — B351 Tinea unguium: Secondary | ICD-10-CM | POA: Diagnosis not present

## 2013-03-22 DIAGNOSIS — D239 Other benign neoplasm of skin, unspecified: Secondary | ICD-10-CM | POA: Diagnosis not present

## 2013-03-22 DIAGNOSIS — L820 Inflamed seborrheic keratosis: Secondary | ICD-10-CM | POA: Diagnosis not present

## 2013-03-22 DIAGNOSIS — D485 Neoplasm of uncertain behavior of skin: Secondary | ICD-10-CM | POA: Diagnosis not present

## 2013-03-22 DIAGNOSIS — L821 Other seborrheic keratosis: Secondary | ICD-10-CM | POA: Diagnosis not present

## 2013-03-22 DIAGNOSIS — L608 Other nail disorders: Secondary | ICD-10-CM | POA: Diagnosis not present

## 2013-04-12 ENCOUNTER — Ambulatory Visit (INDEPENDENT_AMBULATORY_CARE_PROVIDER_SITE_OTHER): Payer: Self-pay | Admitting: Surgery

## 2013-04-26 DIAGNOSIS — G4733 Obstructive sleep apnea (adult) (pediatric): Secondary | ICD-10-CM | POA: Diagnosis not present

## 2013-04-26 DIAGNOSIS — K219 Gastro-esophageal reflux disease without esophagitis: Secondary | ICD-10-CM | POA: Diagnosis not present

## 2013-04-26 DIAGNOSIS — R030 Elevated blood-pressure reading, without diagnosis of hypertension: Secondary | ICD-10-CM | POA: Diagnosis not present

## 2013-07-23 DIAGNOSIS — Z0000 Encounter for general adult medical examination without abnormal findings: Secondary | ICD-10-CM | POA: Diagnosis not present

## 2013-07-23 DIAGNOSIS — Z136 Encounter for screening for cardiovascular disorders: Secondary | ICD-10-CM | POA: Diagnosis not present

## 2013-07-23 DIAGNOSIS — Z1331 Encounter for screening for depression: Secondary | ICD-10-CM | POA: Diagnosis not present

## 2013-07-23 DIAGNOSIS — K219 Gastro-esophageal reflux disease without esophagitis: Secondary | ICD-10-CM | POA: Diagnosis not present

## 2013-07-23 DIAGNOSIS — C61 Malignant neoplasm of prostate: Secondary | ICD-10-CM | POA: Diagnosis not present

## 2013-07-31 DIAGNOSIS — H2510 Age-related nuclear cataract, unspecified eye: Secondary | ICD-10-CM | POA: Diagnosis not present

## 2013-07-31 DIAGNOSIS — H02409 Unspecified ptosis of unspecified eyelid: Secondary | ICD-10-CM | POA: Diagnosis not present

## 2013-07-31 DIAGNOSIS — H52209 Unspecified astigmatism, unspecified eye: Secondary | ICD-10-CM | POA: Diagnosis not present

## 2013-09-02 ENCOUNTER — Other Ambulatory Visit: Payer: Self-pay | Admitting: Gastroenterology

## 2013-09-06 DIAGNOSIS — Z23 Encounter for immunization: Secondary | ICD-10-CM | POA: Diagnosis not present

## 2013-09-12 ENCOUNTER — Encounter: Payer: Self-pay | Admitting: Internal Medicine

## 2013-09-12 ENCOUNTER — Ambulatory Visit (INDEPENDENT_AMBULATORY_CARE_PROVIDER_SITE_OTHER): Payer: Medicare Other | Admitting: Internal Medicine

## 2013-09-12 VITALS — BP 128/64 | HR 74 | Ht 68.0 in | Wt 174.8 lb

## 2013-09-12 DIAGNOSIS — J984 Other disorders of lung: Secondary | ICD-10-CM | POA: Diagnosis not present

## 2013-09-12 DIAGNOSIS — J441 Chronic obstructive pulmonary disease with (acute) exacerbation: Secondary | ICD-10-CM

## 2013-09-12 DIAGNOSIS — G4733 Obstructive sleep apnea (adult) (pediatric): Secondary | ICD-10-CM | POA: Diagnosis not present

## 2013-09-12 DIAGNOSIS — I700 Atherosclerosis of aorta: Secondary | ICD-10-CM | POA: Diagnosis not present

## 2013-09-12 MED ORDER — VALACYCLOVIR HCL 500 MG PO TABS: ORAL_TABLET | ORAL | Status: DC

## 2013-09-12 NOTE — Patient Instructions (Signed)
Office spirometry- dx COPD 

## 2013-09-12 NOTE — Assessment & Plan Note (Signed)
Observation  

## 2013-09-12 NOTE — Progress Notes (Signed)
09/11/12- 71 yoM former smoker, followed for OSA, Hx lung nodule, hx dyspnea  Son is pulmonologist  still has slight SOB at times; wears CPAP8when traveling to prevent snoring. LOV-10/06/10 Has had flu vaccine. Stable dyspnea on exertion. Did  very well on a wilderness trip altitude and horse riding. He wanted to talk about screening CT scan for lung cancer. Father died of lung cancer.  PFT 11-08-2010: Mild obstructive airways disease in small airways with insignificant response to bronchodilator. Increased diffusion capacity raised question of increased blood flow. FEV1 3.00/110%, FEV1/FVC 0.61, FEF 25-75% 1.23/49%. TLC 117%, DLCO 167%. CT chest 10/13/09 compared w/ 2006: IMPRESSION:  1. Stable small lung nodules.  2. No mediastinal or hilar adenopathy.  3. Stable hepatic cysts.  4. Mild distal esophageal wall thickening is a stable finding.  Provider: Niel Hummer, Amy Rogal    09/12/13- 71 yoM former smoker, followed for OSA, Hx lung nodule, hx dyspnea, complicated by scoliosis,   Son is pulmonologist FOLLOWS FOR:sob same,no cough,had CT chest 03/18/13. Sister died w/ lymphoma CPAP 8/ Advanced- used more when travelling to control snoring- discussed.  On valacyclovir to suppress cold sores.  CT chest 02/27/13 IMPRESSION:  No subcutaneous lesion / mass at the site of the patient's palpable  abnormality (anterior right shoulder).  No evidence of metastatic disease in the chest.  Original Report Authenticated By: Charline Bills, M.D.   ROS-see HPI Constitutional:   No-   weight loss, night sweats, fevers, chills, fatigue, lassitude. HEENT:   No-  headaches, difficulty swallowing, tooth/dental problems, sore throat,       No-  sneezing, itching, ear ache, nasal congestion, post nasal drip,  CV:  No-   chest pain, orthopnea, PND, swelling in lower extremities, anasarca,  dizziness, palpitations Resp: + Little shortness of breath with exertion or at rest.              No-   productive  cough,  No non-productive cough,  No- coughing up of blood.              No-   change in color of mucus.  No- wheezing.   Skin: No-   rash or lesions. GI:  No-   heartburn, indigestion, abdominal pain, nausea, vomiting,  GU:  MS:  +  joint pain or swelling.   Neuro-     nothing unusual Psych:  No- change in mood or affect. No depression or anxiety.  No memory loss.  OBJ- Physical Exam General- Alert, Oriented, Affect-appropriate, Distress- none acute, trim Skin- rash-none, lesions- none, excoriation- none Lymphadenopathy- none Head- atraumatic            Eyes- Gross vision intact, PERRLA, conjunctivae and secretions clear            Ears- Hearing, canals-normal            Nose- Clear, no-Septal dev, mucus, polyps, erosion, perforation             Throat- Mallampati II , mucosa clear , drainage- none, tonsils- atrophic Neck- flexible , trachea midline, no stridor , thyroid nl, carotid no bruit Chest - symmetrical excursion , unlabored           Heart/CV- RRR , no murmur , no gallop  , no rub, nl s1 s2                           - JVD- none , edema- none, stasis changes- none, varices-  none           Lung- clear to P&A, wheeze- none, cough- none , dullness-none, rub- none           Chest wall- + mild scoliosis. +Lipoma R axilla Abd-  Br/ Gen/ Rectal- Not done, not indicated Extrem- cyanosis- none, clubbing, none, atrophy- none, strength- nl Neuro- grossly intact to observation

## 2013-09-13 ENCOUNTER — Encounter (HOSPITAL_COMMUNITY): Payer: Self-pay | Admitting: Pharmacy Technician

## 2013-09-17 ENCOUNTER — Encounter (HOSPITAL_COMMUNITY): Payer: Self-pay | Admitting: *Deleted

## 2013-09-25 DIAGNOSIS — L821 Other seborrheic keratosis: Secondary | ICD-10-CM | POA: Diagnosis not present

## 2013-09-25 DIAGNOSIS — D239 Other benign neoplasm of skin, unspecified: Secondary | ICD-10-CM | POA: Diagnosis not present

## 2013-09-25 DIAGNOSIS — Z85820 Personal history of malignant melanoma of skin: Secondary | ICD-10-CM | POA: Diagnosis not present

## 2013-09-25 DIAGNOSIS — D1801 Hemangioma of skin and subcutaneous tissue: Secondary | ICD-10-CM | POA: Diagnosis not present

## 2013-09-28 NOTE — Assessment & Plan Note (Addendum)
Controlled small airway obstruction Plan- no intervention

## 2013-09-28 NOTE — Assessment & Plan Note (Signed)
Ask Advanced for latest download

## 2013-09-28 NOTE — Assessment & Plan Note (Signed)
No intervention needed. 

## 2013-09-30 NOTE — Anesthesia Preprocedure Evaluation (Addendum)
Anesthesia Evaluation  Patient identified by MRN, date of birth, ID band Patient awake    Reviewed: Allergy & Precautions, H&P , NPO status , Patient's Chart, lab work & pertinent test results  Airway Mallampati: III TM Distance: >3 FB Neck ROM: full    Dental no notable dental hx. (+) Teeth Intact and Dental Advisory Given   Pulmonary sleep apnea , COPDformer smoker,  Mild OSA breath sounds clear to auscultation  Pulmonary exam normal       Cardiovascular Exercise Tolerance: Good negative cardio ROS  Rhythm:regular Rate:Normal     Neuro/Psych Acoustic neuroma left ear negative neurological ROS  negative psych ROS   GI/Hepatic negative GI ROS, Neg liver ROS,   Endo/Other  negative endocrine ROS  Renal/GU negative Renal ROS  negative genitourinary   Musculoskeletal   Abdominal   Peds  Hematology negative hematology ROS (+)   Anesthesia Other Findings   Reproductive/Obstetrics negative OB ROS                          Anesthesia Physical Anesthesia Plan  ASA: III  Anesthesia Plan: MAC   Post-op Pain Management:    Induction:   Airway Management Planned: Simple Face Mask  Additional Equipment:   Intra-op Plan:   Post-operative Plan:   Informed Consent: I have reviewed the patients History and Physical, chart, labs and discussed the procedure including the risks, benefits and alternatives for the proposed anesthesia with the patient or authorized representative who has indicated his/her understanding and acceptance.   Dental Advisory Given  Plan Discussed with: CRNA and Surgeon  Anesthesia Plan Comments:         Anesthesia Quick Evaluation

## 2013-10-01 ENCOUNTER — Encounter (HOSPITAL_COMMUNITY): Payer: Self-pay | Admitting: *Deleted

## 2013-10-01 ENCOUNTER — Ambulatory Visit (HOSPITAL_COMMUNITY)
Admission: RE | Admit: 2013-10-01 | Discharge: 2013-10-01 | Disposition: A | Discharge status: Home / Self Care | Payer: Medicare Other | Source: Ambulatory Visit | Attending: Gastroenterology | Admitting: Gastroenterology

## 2013-10-01 ENCOUNTER — Encounter (HOSPITAL_COMMUNITY)
Admission: RE | Disposition: A | Discharge status: Home / Self Care | Payer: Self-pay | Source: Ambulatory Visit | Attending: Gastroenterology

## 2013-10-01 ENCOUNTER — Ambulatory Visit (HOSPITAL_COMMUNITY): Payer: Medicare Other | Admitting: Anesthesiology

## 2013-10-01 ENCOUNTER — Encounter (HOSPITAL_COMMUNITY): Payer: Medicare Other | Admitting: Anesthesiology

## 2013-10-01 DIAGNOSIS — M1990 Unspecified osteoarthritis, unspecified site: Secondary | ICD-10-CM | POA: Diagnosis not present

## 2013-10-01 DIAGNOSIS — J449 Chronic obstructive pulmonary disease, unspecified: Secondary | ICD-10-CM | POA: Insufficient documentation

## 2013-10-01 DIAGNOSIS — N400 Enlarged prostate without lower urinary tract symptoms: Secondary | ICD-10-CM | POA: Insufficient documentation

## 2013-10-01 DIAGNOSIS — K5730 Diverticulosis of large intestine without perforation or abscess without bleeding: Secondary | ICD-10-CM | POA: Diagnosis not present

## 2013-10-01 DIAGNOSIS — Z1211 Encounter for screening for malignant neoplasm of colon: Secondary | ICD-10-CM | POA: Diagnosis not present

## 2013-10-01 DIAGNOSIS — Z8546 Personal history of malignant neoplasm of prostate: Secondary | ICD-10-CM | POA: Insufficient documentation

## 2013-10-01 DIAGNOSIS — Z9079 Acquired absence of other genital organ(s): Secondary | ICD-10-CM | POA: Insufficient documentation

## 2013-10-01 DIAGNOSIS — G4733 Obstructive sleep apnea (adult) (pediatric): Secondary | ICD-10-CM | POA: Insufficient documentation

## 2013-10-01 DIAGNOSIS — Z85820 Personal history of malignant melanoma of skin: Secondary | ICD-10-CM | POA: Diagnosis not present

## 2013-10-01 DIAGNOSIS — Z87891 Personal history of nicotine dependence: Secondary | ICD-10-CM | POA: Diagnosis not present

## 2013-10-01 DIAGNOSIS — I10 Essential (primary) hypertension: Secondary | ICD-10-CM | POA: Diagnosis not present

## 2013-10-01 DIAGNOSIS — J4489 Other specified chronic obstructive pulmonary disease: Secondary | ICD-10-CM | POA: Insufficient documentation

## 2013-10-01 HISTORY — DX: Chronic obstructive pulmonary disease, unspecified: J44.9

## 2013-10-01 HISTORY — DX: Spinal stenosis, site unspecified: M48.00

## 2013-10-01 HISTORY — DX: Herpesviral gingivostomatitis and pharyngotonsillitis: B00.2

## 2013-10-01 HISTORY — DX: Diverticulosis of intestine, part unspecified, without perforation or abscess without bleeding: K57.90

## 2013-10-01 HISTORY — DX: Essential (primary) hypertension: I10

## 2013-10-01 HISTORY — DX: Unspecified osteoarthritis, unspecified site: M19.90

## 2013-10-01 HISTORY — DX: Malignant (primary) neoplasm, unspecified: C80.1

## 2013-10-01 HISTORY — PX: COLONOSCOPY WITH PROPOFOL: SHX5780

## 2013-10-01 HISTORY — DX: Polyneuropathy, unspecified: G62.9

## 2013-10-01 HISTORY — DX: Dyskinesia of esophagus: K22.4

## 2013-10-01 SURGERY — COLONOSCOPY WITH PROPOFOL
Anesthesia: Monitor Anesthesia Care

## 2013-10-01 MED ORDER — SODIUM CHLORIDE 0.9 % IV SOLN: INTRAVENOUS | Status: DC

## 2013-10-01 MED ORDER — FENTANYL CITRATE 0.05 MG/ML IJ SOLN: 25.0000 ug | INTRAMUSCULAR | Status: DC | PRN

## 2013-10-01 MED ORDER — LACTATED RINGERS IV SOLN
INTRAVENOUS | Status: DC
  Administered 2013-10-01: 1000 mL via INTRAVENOUS
  Administered 2013-10-01: 08:00:00 via INTRAVENOUS

## 2013-10-01 MED ORDER — GLYCOPYRROLATE 0.2 MG/ML IJ SOLN
INTRAMUSCULAR | Status: DC | PRN
  Administered 2013-10-01: 0.2 mg via INTRAVENOUS

## 2013-10-01 MED ORDER — PROPOFOL INFUSION 10 MG/ML OPTIME
INTRAVENOUS | Status: DC | PRN
  Administered 2013-10-01: 140 ug/kg/min via INTRAVENOUS

## 2013-10-01 MED ORDER — PROPOFOL 10 MG/ML IV BOLUS
INTRAVENOUS | Status: DC | PRN
  Administered 2013-10-01: 50 mg via INTRAVENOUS
  Administered 2013-10-01: 30 mg via INTRAVENOUS

## 2013-10-01 MED ORDER — FENTANYL CITRATE 0.05 MG/ML IJ SOLN
INTRAMUSCULAR | Status: DC | PRN
  Administered 2013-10-01 (×2): 25 ug via INTRAVENOUS

## 2013-10-01 SURGICAL SUPPLY — 22 items

## 2013-10-01 NOTE — Preoperative (Signed)
Beta Blockers   Reason not to administer Beta Blockers:Not Applicable, not on home BB 

## 2013-10-01 NOTE — H&P (Signed)
  Procedure: Screening colonoscopy  History: The patient is a 72 year old male born 03-14-1941. On 08/19/2003, the patient underwent a normal screening colonoscopy.  The patient is scheduled to undergo a repeat screening colonoscopy today.  Past medical history: Osteoarthritis. Prostatic hypertrophy. Acoustic neuroma. Colonic diverticulosis. Obstructive sleep apnea syndrome. Prostate cancer. Systolic hypertension. Cervical spinal stenosis. Pilonidal cystectomy. Left cataract surgery. Nasal septum repair. Prostatectomy. Left elbow and shoulder surgery. Acoustic neuroma surgery. Superficial melanoma left shoulder surgery.  Allergies: Demerol causes confusion. Versed causes irritability.  Exam: The patient is alert and lying comfortably on the endoscopy stretcher. Abdomen is soft and nontender to palpation. Cardiac exam reveals a regular rhythm. Lungs are clear to auscultation.  Plan: Proceed with screening colonoscopy using propofol sedation.

## 2013-10-01 NOTE — Anesthesia Postprocedure Evaluation (Signed)
  Anesthesia Post-op Note  Patient: Warren [name]  Procedure(s) Performed: Procedure(s) (LRB): COLONOSCOPY WITH PROPOFOL (N/A)  Patient Location: PACU  Anesthesia Type: MAC  Level of Consciousness: awake and alert   Airway and Oxygen Therapy: Patient Spontanous Breathing  Post-op Pain: mild  Post-op Assessment: Post-op Vital signs reviewed, Patient's Cardiovascular Status Stable, Respiratory Function Stable, Patent Airway and No signs of Nausea or vomiting  Last Vitals:  Filed Vitals:   10/01/13 0842  BP: 134/76  Pulse: 60  Temp: 36.7 C  Resp: 18    Post-op Vital Signs: stable   Complications: No apparent anesthesia complications

## 2013-10-01 NOTE — Progress Notes (Signed)
Pt toolerating drinking coca cola

## 2013-10-01 NOTE — Transfer of Care (Signed)
Immediate Anesthesia Transfer of Care Note  Patient: Warren [name]  Procedure(s) Performed: Procedure(s): COLONOSCOPY WITH PROPOFOL (N/A)  Patient Location: PACU, ENDO  Anesthesia Type:MAC  Level of Consciousness: Patient easily awoken, sedated, comfortable, cooperative, following commands, responds to stimulation.   Airway & Oxygen Therapy: Patient spontaneously breathing, ventilating well, oxygen via simple oxygen mask.  Post-op Assessment: Report given to PACU RN, vital signs reviewed and stable, moving all extremities.   Post vital signs: Reviewed and stable.  Complications: No apparent anesthesia complications

## 2013-10-01 NOTE — Op Note (Signed)
Procedure: Screening colonoscopy. Normal screening colonoscopy in September 2004.  Endoscopist: Danise Edge  Premedication: Propofol administered by anesthesia  Procedure: The patient was placed in the left lateral decubitus position. Anal inspection and digital rectal exam were normal. Prostate was absent. The Pentax pediatric colonoscope was introduced into the rectum and advanced to the cecum. A normal-appearing ileocecal valve and appendiceal orifice were identified. Colonic preparation for the exam today was good.  Rectum. Normal. Retroflex view of the distal rectum normal  Sigmoid colon and descending colon. Left colonic diverticulosis  Splenic flexure. Normal  Transverse colon. Normal  Hepatic flexure. Normal  Ascending colon. Normal  Cecum and ileocecal valve. Normal  Assessment: Normal screening proctocolonoscopy to the cecum.

## 2013-10-02 ENCOUNTER — Encounter (HOSPITAL_COMMUNITY): Payer: Self-pay | Admitting: Gastroenterology

## 2013-10-07 ENCOUNTER — Ambulatory Visit: Payer: Medicare Other | Admitting: Interventional Cardiology

## 2013-10-09 DIAGNOSIS — L908 Other atrophic disorders of skin: Secondary | ICD-10-CM | POA: Diagnosis not present

## 2013-10-09 DIAGNOSIS — H02839 Dermatochalasis of unspecified eye, unspecified eyelid: Secondary | ICD-10-CM | POA: Diagnosis not present

## 2013-10-09 DIAGNOSIS — H02409 Unspecified ptosis of unspecified eyelid: Secondary | ICD-10-CM | POA: Diagnosis not present

## 2013-10-09 DIAGNOSIS — H11439 Conjunctival hyperemia, unspecified eye: Secondary | ICD-10-CM | POA: Diagnosis not present

## 2013-10-10 ENCOUNTER — Encounter: Payer: Self-pay | Admitting: Cardiology

## 2013-10-10 ENCOUNTER — Encounter: Payer: Self-pay | Admitting: *Deleted

## 2013-10-10 ENCOUNTER — Ambulatory Visit (INDEPENDENT_AMBULATORY_CARE_PROVIDER_SITE_OTHER): Payer: Medicare Other | Admitting: Cardiology

## 2013-10-10 VITALS — BP 138/80 | HR 61 | Ht 68.0 in | Wt 175.0 lb

## 2013-10-10 DIAGNOSIS — I2510 Atherosclerotic heart disease of native coronary artery without angina pectoris: Secondary | ICD-10-CM

## 2013-10-10 DIAGNOSIS — I10 Essential (primary) hypertension: Secondary | ICD-10-CM | POA: Insufficient documentation

## 2013-10-10 DIAGNOSIS — I700 Atherosclerosis of aorta: Secondary | ICD-10-CM | POA: Diagnosis not present

## 2013-10-10 DIAGNOSIS — I2584 Coronary atherosclerosis due to calcified coronary lesion: Secondary | ICD-10-CM | POA: Diagnosis not present

## 2013-10-10 NOTE — Patient Instructions (Signed)
Your physician recommends that you return for lab work in: Tomorrow morning for Fasting BMET, Lipid Panel, and Hepatic panel  Your physician has requested that you have cardiac CT. Cardiac computed tomography (CT) is a painless test that uses an x-ray machine to take clear, detailed pictures of your heart. For further information please visit [email or web address] Please follow instruction sheet as given.   Your physician wants you to follow-up in: 1 year with Dr. Sherlyn Lick will receive a reminder letter in the mail two months in advance. If you don't receive a letter, please call our office to schedule the follow-up appointment.
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
## 2013-10-11 ENCOUNTER — Other Ambulatory Visit (INDEPENDENT_AMBULATORY_CARE_PROVIDER_SITE_OTHER): Payer: Medicare Other

## 2013-10-11 DIAGNOSIS — I2584 Coronary atherosclerosis due to calcified coronary lesion: Secondary | ICD-10-CM

## 2013-10-11 DIAGNOSIS — I2510 Atherosclerotic heart disease of native coronary artery without angina pectoris: Secondary | ICD-10-CM

## 2013-10-11 LAB — HEPATIC FUNCTION PANEL
ALT: 13 U/L (ref 0–53)
Alkaline Phosphatase: 48 U/L (ref 39–117)
Bilirubin, Direct: 0.1 mg/dL (ref 0.0–0.3)
Total Protein: 6.1 g/dL (ref 6.0–8.3)

## 2013-10-11 LAB — LIPID PANEL
Cholesterol: 199 mg/dL (ref 0–200)
LDL Cholesterol: 114 mg/dL — ABNORMAL HIGH (ref 0–99)
Triglycerides: 102 mg/dL (ref 0.0–149.0)

## 2013-10-11 LAB — BASIC METABOLIC PANEL
BUN: 17 mg/dL (ref 6–23)
Calcium: 8.6 mg/dL (ref 8.4–10.5)
Creatinine, Ser: 1 mg/dL (ref 0.4–1.5)
GFR: 79.41 mL/min (ref >60.00)

## 2013-10-16 NOTE — Progress Notes (Signed)
lmtrc

## 2013-10-17 ENCOUNTER — Encounter: Payer: Self-pay | Admitting: General Surgery

## 2013-10-17 ENCOUNTER — Telehealth: Payer: Self-pay | Admitting: General Surgery

## 2013-10-17 ENCOUNTER — Telehealth: Payer: Self-pay | Admitting: Cardiology

## 2013-10-17 DIAGNOSIS — Z79899 Other long term (current) drug therapy: Secondary | ICD-10-CM

## 2013-10-17 DIAGNOSIS — E785 Hyperlipidemia, unspecified: Secondary | ICD-10-CM

## 2013-10-17 MED ORDER — SIMVASTATIN 10 MG PO TABS: 10.0000 mg | ORAL_TABLET | Freq: Every day | ORAL | Status: DC

## 2013-10-17 NOTE — Telephone Encounter (Signed)
Pt is aware. He will not be in town in 6 week until march. We scheduled him for 11/12/13.

## 2013-10-17 NOTE — Telephone Encounter (Signed)
New Problem:  Pt states he is calling for test results.

## 2013-10-17 NOTE — Telephone Encounter (Signed)
Spoke to pt and made aware.

## 2013-10-17 NOTE — Telephone Encounter (Signed)
Message copied by Nita Sells on Thu Oct 17, 2013 11:07 AM ------      Message from: Armanda Magic R      Created: Fri Oct 11, 2013  2:31 PM       Mildly elevated LDL with coronary artery calcifications on chest CT - please have patient start simvastatin 10mg  daily and recheck lipid panel and ALT in 6 weeks ------

## 2013-10-22 ENCOUNTER — Encounter (HOSPITAL_COMMUNITY): Payer: Self-pay

## 2013-10-22 ENCOUNTER — Ambulatory Visit (HOSPITAL_COMMUNITY)
Admission: RE | Admit: 2013-10-22 | Discharge: 2013-10-22 | Disposition: A | Discharge status: Home / Self Care | Payer: Medicare Other | Source: Ambulatory Visit | Attending: Cardiology | Admitting: Cardiology

## 2013-10-22 DIAGNOSIS — I2510 Atherosclerotic heart disease of native coronary artery without angina pectoris: Secondary | ICD-10-CM | POA: Insufficient documentation

## 2013-10-22 DIAGNOSIS — R0789 Other chest pain: Secondary | ICD-10-CM | POA: Diagnosis not present

## 2013-10-22 DIAGNOSIS — I701 Atherosclerosis of renal artery: Secondary | ICD-10-CM | POA: Insufficient documentation

## 2013-10-22 MED ORDER — NITROGLYCERIN 0.4 MG SL SUBL
SUBLINGUAL_TABLET | SUBLINGUAL | Status: AC
  Filled 2013-10-22: qty 25

## 2013-10-22 MED ORDER — METOPROLOL TARTRATE 1 MG/ML IV SOLN
5.0000 mg | Freq: Once | INTRAVENOUS | Status: AC
  Administered 2013-10-22: 5 mg via INTRAVENOUS
  Filled 2013-10-22: qty 5

## 2013-10-22 MED ORDER — NITROGLYCERIN 0.4 MG SL SUBL
0.4000 mg | SUBLINGUAL_TABLET | SUBLINGUAL | Status: DC | PRN
  Administered 2013-10-22: 0.4 mg via SUBLINGUAL
  Filled 2013-10-22: qty 25

## 2013-10-22 MED ORDER — IOHEXOL 350 MG/ML SOLN
80.0000 mL | Freq: Once | INTRAVENOUS | Status: AC | PRN
  Administered 2013-10-22: 80 mL via INTRAVENOUS

## 2013-10-22 MED ORDER — METOPROLOL TARTRATE 1 MG/ML IV SOLN
INTRAVENOUS | Status: AC
  Filled 2013-10-22: qty 5

## 2013-10-23 DIAGNOSIS — R079 Chest pain, unspecified: Secondary | ICD-10-CM

## 2013-10-31 ENCOUNTER — Telehealth: Payer: Self-pay | Admitting: Cardiology

## 2013-10-31 NOTE — Telephone Encounter (Signed)
Received request from Nurse fax box, documents faxed for surgical clearance. To: Oculofacial Surgery Fax number: [phone] Attention: 10/31/13/km

## 2013-11-12 ENCOUNTER — Other Ambulatory Visit: Payer: Medicare Other

## 2013-11-13 ENCOUNTER — Other Ambulatory Visit (INDEPENDENT_AMBULATORY_CARE_PROVIDER_SITE_OTHER): Payer: Medicare Other

## 2013-11-13 DIAGNOSIS — E785 Hyperlipidemia, unspecified: Secondary | ICD-10-CM

## 2013-11-13 DIAGNOSIS — Z79899 Other long term (current) drug therapy: Secondary | ICD-10-CM

## 2013-11-13 LAB — LIPID PANEL
Cholesterol: 138 mg/dL (ref 0–200)
HDL: 62.8 mg/dL (ref >39.00)
Triglycerides: 109 mg/dL (ref 0.0–149.0)
VLDL: 21.8 mg/dL (ref 0.0–40.0)

## 2013-11-13 LAB — ALT: ALT: 18 U/L (ref 0–53)

## 2014-02-26 DIAGNOSIS — H029 Unspecified disorder of eyelid: Secondary | ICD-10-CM | POA: Diagnosis not present

## 2014-02-26 DIAGNOSIS — E782 Mixed hyperlipidemia: Secondary | ICD-10-CM | POA: Diagnosis not present

## 2014-02-26 DIAGNOSIS — Z01818 Encounter for other preprocedural examination: Secondary | ICD-10-CM | POA: Diagnosis not present

## 2014-03-10 DIAGNOSIS — H02109 Unspecified ectropion of unspecified eye, unspecified eyelid: Secondary | ICD-10-CM | POA: Diagnosis not present

## 2014-03-10 DIAGNOSIS — H02139 Senile ectropion of unspecified eye, unspecified eyelid: Secondary | ICD-10-CM | POA: Diagnosis not present

## 2014-03-19 DIAGNOSIS — L980 Pyogenic granuloma: Secondary | ICD-10-CM | POA: Diagnosis not present

## 2014-03-19 DIAGNOSIS — S058X9A Other injuries of unspecified eye and orbit, initial encounter: Secondary | ICD-10-CM | POA: Diagnosis not present

## 2014-03-19 DIAGNOSIS — H02139 Senile ectropion of unspecified eye, unspecified eyelid: Secondary | ICD-10-CM | POA: Diagnosis not present

## 2014-03-19 DIAGNOSIS — H02819 Retained foreign body in unspecified eye, unspecified eyelid: Secondary | ICD-10-CM | POA: Diagnosis not present

## 2014-03-19 DIAGNOSIS — Z189 Retained foreign body fragments, unspecified material: Secondary | ICD-10-CM | POA: Diagnosis not present

## 2014-03-21 DIAGNOSIS — L919 Hypertrophic disorder of the skin, unspecified: Secondary | ICD-10-CM | POA: Diagnosis not present

## 2014-03-21 DIAGNOSIS — D239 Other benign neoplasm of skin, unspecified: Secondary | ICD-10-CM | POA: Diagnosis not present

## 2014-03-21 DIAGNOSIS — L820 Inflamed seborrheic keratosis: Secondary | ICD-10-CM | POA: Diagnosis not present

## 2014-03-21 DIAGNOSIS — Z85820 Personal history of malignant melanoma of skin: Secondary | ICD-10-CM | POA: Diagnosis not present

## 2014-03-21 DIAGNOSIS — L909 Atrophic disorder of skin, unspecified: Secondary | ICD-10-CM | POA: Diagnosis not present

## 2014-03-21 DIAGNOSIS — D485 Neoplasm of uncertain behavior of skin: Secondary | ICD-10-CM | POA: Diagnosis not present

## 2014-03-21 DIAGNOSIS — L819 Disorder of pigmentation, unspecified: Secondary | ICD-10-CM | POA: Diagnosis not present

## 2014-03-21 DIAGNOSIS — L570 Actinic keratosis: Secondary | ICD-10-CM | POA: Diagnosis not present

## 2014-03-21 DIAGNOSIS — L821 Other seborrheic keratosis: Secondary | ICD-10-CM | POA: Diagnosis not present

## 2014-03-24 DIAGNOSIS — L918 Other hypertrophic disorders of the skin: Secondary | ICD-10-CM | POA: Diagnosis not present

## 2014-03-24 DIAGNOSIS — H02419 Mechanical ptosis of unspecified eyelid: Secondary | ICD-10-CM | POA: Diagnosis not present

## 2014-03-24 DIAGNOSIS — L908 Other atrophic disorders of skin: Secondary | ICD-10-CM | POA: Diagnosis not present

## 2014-03-24 DIAGNOSIS — H02409 Unspecified ptosis of unspecified eyelid: Secondary | ICD-10-CM | POA: Diagnosis not present

## 2014-03-27 DIAGNOSIS — J029 Acute pharyngitis, unspecified: Secondary | ICD-10-CM | POA: Diagnosis not present

## 2014-03-27 DIAGNOSIS — J3489 Other specified disorders of nose and nasal sinuses: Secondary | ICD-10-CM | POA: Diagnosis not present

## 2014-03-28 NOTE — Telephone Encounter (Signed)
Please Close Encounter

## 2014-04-09 ENCOUNTER — Telehealth: Payer: Self-pay | Admitting: Cardiology

## 2014-04-09 MED ORDER — SIMVASTATIN 10 MG PO TABS: 10.0000 mg | ORAL_TABLET | Freq: Every day | ORAL | Status: DC

## 2014-04-09 NOTE — Telephone Encounter (Signed)
Called requesting refill on his Simvastatin 10 mg but would like a 90 day supply and is changing to new [facility] on [address] at [facility].  Called his current WM and they gave phone # as [phone].  Called his Rx in for 90 days of simvastatin 10 mg.  Pharmacist states not in systems yet but will be listed as [facility] [phone] [address].

## 2014-04-09 NOTE — Telephone Encounter (Signed)
New message  Pt called to have his statin medication changed from 30 to 90 day supply. Please assist

## 2014-07-30 DIAGNOSIS — Z1331 Encounter for screening for depression: Secondary | ICD-10-CM | POA: Diagnosis not present

## 2014-07-30 DIAGNOSIS — Z23 Encounter for immunization: Secondary | ICD-10-CM | POA: Diagnosis not present

## 2014-07-30 DIAGNOSIS — J309 Allergic rhinitis, unspecified: Secondary | ICD-10-CM | POA: Diagnosis not present

## 2014-07-30 DIAGNOSIS — G4730 Sleep apnea, unspecified: Secondary | ICD-10-CM | POA: Diagnosis not present

## 2014-07-30 DIAGNOSIS — E782 Mixed hyperlipidemia: Secondary | ICD-10-CM | POA: Diagnosis not present

## 2014-07-30 DIAGNOSIS — J449 Chronic obstructive pulmonary disease, unspecified: Secondary | ICD-10-CM | POA: Diagnosis not present

## 2014-07-30 DIAGNOSIS — Z0000 Encounter for general adult medical examination without abnormal findings: Secondary | ICD-10-CM | POA: Diagnosis not present

## 2014-07-30 DIAGNOSIS — Z8546 Personal history of malignant neoplasm of prostate: Secondary | ICD-10-CM | POA: Diagnosis not present

## 2014-07-30 DIAGNOSIS — K219 Gastro-esophageal reflux disease without esophagitis: Secondary | ICD-10-CM | POA: Diagnosis not present

## 2014-08-01 DIAGNOSIS — H52209 Unspecified astigmatism, unspecified eye: Secondary | ICD-10-CM | POA: Diagnosis not present

## 2014-08-01 DIAGNOSIS — H2510 Age-related nuclear cataract, unspecified eye: Secondary | ICD-10-CM | POA: Diagnosis not present

## 2014-08-19 ENCOUNTER — Telehealth: Payer: Self-pay | Admitting: Internal Medicine

## 2014-08-19 NOTE — Telephone Encounter (Signed)
Please advise if pt can be worked in sooner? thanks

## 2014-08-19 NOTE — Telephone Encounter (Signed)
Please see if patient can come in on Wednesday 09-17-14 at 11:15am. If not please see what days and times work best for patient. Thanks.

## 2014-08-19 NOTE — Telephone Encounter (Signed)
Called spoke with pt. appt scheduled 10/21. Nothing further needed

## 2014-09-12 ENCOUNTER — Ambulatory Visit: Payer: Medicare Other | Admitting: Internal Medicine

## 2014-09-17 ENCOUNTER — Telehealth: Payer: Self-pay | Admitting: Internal Medicine

## 2014-09-17 ENCOUNTER — Encounter (INDEPENDENT_AMBULATORY_CARE_PROVIDER_SITE_OTHER): Payer: Self-pay

## 2014-09-17 ENCOUNTER — Encounter: Payer: Self-pay | Admitting: Internal Medicine

## 2014-09-17 ENCOUNTER — Ambulatory Visit: Payer: Medicare Other | Admitting: Internal Medicine

## 2014-09-17 VITALS — BP 158/90 | HR 64 | Ht 68.0 in | Wt 175.0 lb

## 2014-09-17 DIAGNOSIS — R0600 Dyspnea, unspecified: Secondary | ICD-10-CM

## 2014-09-17 NOTE — Telephone Encounter (Signed)
lmtcb to schedule pft for now

## 2014-09-17 NOTE — Progress Notes (Signed)
10/15/13Apr 14, [age] yoM former smoker, followed for OSA, Hx lung nodule, hx dyspnea  Son is pulmonologist  still has slight SOB at times; wears CPAP8when traveling to prevent snoring. LOV-10/06/10 Has had flu vaccine. Stable dyspnea on exertion. Did  very well on a wilderness trip altitude and horse riding. He wanted to talk about screening CT scan for lung cancer. Father died of lung cancer.  PFT 2010/11/10: Mild obstructive airways disease in small airways with insignificant response to bronchodilator. Increased diffusion capacity raised question of increased blood flow. FEV1 3.00/110%, FEV1/FVC 0.61, FEF 25-75% 1.23/49%. TLC 117%, DLCO 167%. CT chest 10/13/09 compared w/ 2005-03-11: IMPRESSION:  1. Stable small lung nodules.  2. No mediastinal or hilar adenopathy.  3. Stable hepatic cysts.  4. Mild distal esophageal wall thickening is a stable finding.  Provider: Lennice Sites, Amy Rogal    [birth year] yoM former smoker, followed for OSA, Hx lung nodule, hx dyspnea, complicated by scoliosis,   Son is pulmonologist FOLLOWS FOR:sob same,no cough,had CT chest 03-11-2013. Sister died w/ lymphoma CPAP 8/ [employer]- used more when travelling to control snoring- discussed.  On valacyclovir to suppress cold sores.  CT chest 02/27/13 IMPRESSION:  No subcutaneous lesion / mass at the site of the patient's palpable  abnormality (anterior right shoulder).  No evidence of metastatic disease in the chest.  Original Report Authenticated By: Julian Hy, M.D.  09/17/14- 5 yoM former smoker, followed for OSA, Hx lung nodule, hx dyspnea, complicated by scoliosis,   Son is pulmonologist FOLLOWS FOR: sob has not improved, no cough. CPAP 8/ [employer]- used more when travelling to control snoring- discussed.     ROS-see HPI Constitutional:   No-   weight loss, night sweats, fevers, chills, fatigue, lassitude. HEENT:   No-  headaches, difficulty swallowing, tooth/dental problems, sore throat,       No-  sneezing,  itching, ear ache, nasal congestion, post nasal drip,  CV:  No-   chest pain, orthopnea, PND, swelling in lower extremities, anasarca,  dizziness, palpitations Resp: + Little shortness of breath with exertion or at rest.              No-   productive cough,  No non-productive cough,  No- coughing up of blood.              No-   change in color of mucus.  No- wheezing.   Skin: No-   rash or lesions. GI:  No-   heartburn, indigestion, abdominal pain, nausea, vomiting,  GU:  MS:  +  joint pain or swelling.   Neuro-     nothing unusual Psych:  No- change in mood or affect. No depression or anxiety.  No memory loss.  OBJ- Physical Exam General- Alert, Oriented, Affect-appropriate, Distress- none acute, trim Skin- rash-none, lesions- none, excoriation- none Lymphadenopathy- none Head- atraumatic            Eyes- Gross vision intact, PERRLA, conjunctivae and secretions clear            Ears- Hearing, canals-normal            Nose- Clear, no-Septal dev, mucus, polyps, erosion, perforation             Throat- Mallampati II , mucosa clear , drainage- none, tonsils- atrophic Neck- flexible , trachea midline, no stridor , thyroid nl, carotid no bruit Chest - symmetrical excursion , unlabored           Heart/CV- RRR , no murmur , no gallop  ,  no rub, nl s1 s2                           - JVD- none , edema- none, stasis changes- none, varices- none           Lung- clear to P&A, wheeze- none, cough- none , dullness-none, rub- none           Chest wall- + mild scoliosis. +Lipoma R axilla Abd-  Br/ Gen/ Rectal- Not done, not indicated Extrem- cyanosis- none, clubbing, none, atrophy- none, strength- nl Neuro- grossly intact to observation

## 2014-09-17 NOTE — Patient Instructions (Addendum)
Sample Anoro inhaler    1 puff once daily   Let us know if you think this makes a useful difference in your breathing  Order- Schedule PFT   Dx Dyspnea on exertion  Please call if we can help

## 2014-09-19 NOTE — Telephone Encounter (Signed)
ATC x 2 [phone]  Voicemail is stating a different number-- [phone] This alternate number is not listed on patient chart as a contact.  No message left at this time.

## 2014-09-22 NOTE — Telephone Encounter (Signed)
PFT already scheduled for 09/24/14. Pt aware. Nothing further needed.

## 2014-09-23 ENCOUNTER — Other Ambulatory Visit: Payer: Self-pay | Admitting: Internal Medicine

## 2014-09-23 DIAGNOSIS — K219 Gastro-esophageal reflux disease without esophagitis: Secondary | ICD-10-CM | POA: Diagnosis not present

## 2014-09-23 DIAGNOSIS — I10 Essential (primary) hypertension: Secondary | ICD-10-CM | POA: Diagnosis not present

## 2014-09-24 ENCOUNTER — Ambulatory Visit (INDEPENDENT_AMBULATORY_CARE_PROVIDER_SITE_OTHER): Payer: Medicare Other | Admitting: Internal Medicine

## 2014-09-24 DIAGNOSIS — D225 Melanocytic nevi of trunk: Secondary | ICD-10-CM | POA: Diagnosis not present

## 2014-09-24 DIAGNOSIS — R0600 Dyspnea, unspecified: Secondary | ICD-10-CM | POA: Diagnosis not present

## 2014-09-24 DIAGNOSIS — D1801 Hemangioma of skin and subcutaneous tissue: Secondary | ICD-10-CM | POA: Diagnosis not present

## 2014-09-24 DIAGNOSIS — L820 Inflamed seborrheic keratosis: Secondary | ICD-10-CM | POA: Diagnosis not present

## 2014-09-24 DIAGNOSIS — L821 Other seborrheic keratosis: Secondary | ICD-10-CM | POA: Diagnosis not present

## 2014-09-24 DIAGNOSIS — B351 Tinea unguium: Secondary | ICD-10-CM | POA: Diagnosis not present

## 2014-09-24 LAB — PULMONARY FUNCTION TEST
DL/VA % pred: 98 %
DL/VA: 4.33 ml/min/mmHg/L
DLCO unc % pred: 102 %
DLCO unc: 28.99 ml/min/mmHg
FEF 25-75 Post: 2.06 L/sec
FEF 25-75 Pre: 1.48 L/sec
FEF2575-%CHANGE-POST: 39 %
FEF2575-%Pred-Post: 105 %
FEF2575-%Pred-Pre: 76 %
FEV1-%CHANGE-POST: 9 %
FEV1-%Pred-Post: 112 %
FEV1-%Pred-Pre: 102 %
FEV1-POST: 3.01 L
FEV1-Pre: 2.74 L
FEV1FVC-%Change-Post: 3 %
FEV1FVC-%Pred-Pre: 89 %
FEV6-%Change-Post: 6 %
FEV6-%Pred-Post: 124 %
FEV6-%Pred-Pre: 117 %
FEV6-PRE: 4.08 L
FEV6-Post: 4.33 L
FEV6FVC-%Change-Post: 0 %
FEV6FVC-%PRED-PRE: 104 %
FEV6FVC-%Pred-Post: 104 %
FVC-%Change-Post: 5 %
FVC-%Pred-Post: 119 %
FVC-%Pred-Pre: 112 %
FVC-POST: 4.44 L
FVC-Pre: 4.2 L
POST FEV1/FVC RATIO: 68 %
POST FEV6/FVC RATIO: 98 %
Pre FEV1/FVC ratio: 65 %
Pre FEV6/FVC Ratio: 97 %
RV % PRED: 110 %
RV: 2.64 L
TLC % pred: 107 %
TLC: 6.95 L

## 2014-09-24 NOTE — Progress Notes (Signed)
PFT done today. 

## 2014-09-25 ENCOUNTER — Ambulatory Visit
Admission: RE | Admit: 2014-09-25 | Discharge: 2014-09-25 | Disposition: A | Discharge status: Home / Self Care | Payer: Medicare Other | Source: Ambulatory Visit | Attending: Internal Medicine | Admitting: Internal Medicine

## 2014-09-25 DIAGNOSIS — K449 Diaphragmatic hernia without obstruction or gangrene: Secondary | ICD-10-CM | POA: Diagnosis not present

## 2014-09-25 DIAGNOSIS — K219 Gastro-esophageal reflux disease without esophagitis: Secondary | ICD-10-CM

## 2014-11-04 DIAGNOSIS — I10 Essential (primary) hypertension: Secondary | ICD-10-CM | POA: Diagnosis not present

## 2014-11-04 DIAGNOSIS — K219 Gastro-esophageal reflux disease without esophagitis: Secondary | ICD-10-CM | POA: Diagnosis not present

## 2014-11-05 ENCOUNTER — Other Ambulatory Visit: Payer: Self-pay | Admitting: Gastroenterology

## 2014-11-05 DIAGNOSIS — K222 Esophageal obstruction: Secondary | ICD-10-CM | POA: Diagnosis not present

## 2014-11-06 ENCOUNTER — Encounter (HOSPITAL_COMMUNITY): Payer: Self-pay | Admitting: *Deleted

## 2014-11-10 ENCOUNTER — Other Ambulatory Visit: Payer: Self-pay | Admitting: Gastroenterology

## 2014-11-11 ENCOUNTER — Ambulatory Visit (INDEPENDENT_AMBULATORY_CARE_PROVIDER_SITE_OTHER): Payer: Medicare Other | Admitting: Cardiology

## 2014-11-11 ENCOUNTER — Encounter: Payer: Self-pay | Admitting: Cardiology

## 2014-11-11 VITALS — BP 124/70 | HR 63 | Ht 68.0 in | Wt 172.6 lb

## 2014-11-11 DIAGNOSIS — E785 Hyperlipidemia, unspecified: Secondary | ICD-10-CM

## 2014-11-11 DIAGNOSIS — I700 Atherosclerosis of aorta: Secondary | ICD-10-CM | POA: Diagnosis not present

## 2014-11-11 DIAGNOSIS — I2510 Atherosclerotic heart disease of native coronary artery without angina pectoris: Secondary | ICD-10-CM

## 2014-11-11 DIAGNOSIS — I10 Essential (primary) hypertension: Secondary | ICD-10-CM | POA: Diagnosis not present

## 2014-11-11 DIAGNOSIS — I2584 Coronary atherosclerosis due to calcified coronary lesion: Secondary | ICD-10-CM

## 2014-11-11 MED ORDER — NITROGLYCERIN 0.4 MG SL SUBL: 0.4000 mg | SUBLINGUAL_TABLET | SUBLINGUAL | Status: DC | PRN
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
## 2014-11-11 NOTE — Patient Instructions (Addendum)
Your physician recommends you have a STRESS MYOVIEW.  Your physician recommends that you continue on your current medications as directed. Please refer to the Current Medication list given to you today.  Your physician wants you to follow-up in: 1 year with Dr. Radford Pax. You will receive a reminder letter in the mail two months in advance. If you don't receive a letter, please call our office to schedule the follow-up appointment.

## 2014-11-13 ENCOUNTER — Ambulatory Visit (HOSPITAL_COMMUNITY): Payer: Medicare Other | Attending: Cardiology | Admitting: Radiology

## 2014-11-13 DIAGNOSIS — R079 Chest pain, unspecified: Secondary | ICD-10-CM | POA: Diagnosis not present

## 2014-11-13 DIAGNOSIS — I2510 Atherosclerotic heart disease of native coronary artery without angina pectoris: Secondary | ICD-10-CM | POA: Diagnosis not present

## 2014-11-13 DIAGNOSIS — Z6826 Body mass index (BMI) 26.0-26.9, adult: Secondary | ICD-10-CM | POA: Diagnosis not present

## 2014-11-13 DIAGNOSIS — Z136 Encounter for screening for cardiovascular disorders: Secondary | ICD-10-CM | POA: Insufficient documentation

## 2014-11-13 DIAGNOSIS — I10 Essential (primary) hypertension: Secondary | ICD-10-CM | POA: Diagnosis not present

## 2014-11-13 DIAGNOSIS — R0600 Dyspnea, unspecified: Secondary | ICD-10-CM | POA: Diagnosis not present

## 2014-11-13 MED ORDER — TECHNETIUM TC 99M SESTAMIBI GENERIC - CARDIOLITE
11.0000 | Freq: Once | INTRAVENOUS | Status: AC | PRN
  Administered 2014-11-13: 11 via INTRAVENOUS

## 2014-11-13 MED ORDER — TECHNETIUM TC 99M SESTAMIBI GENERIC - CARDIOLITE
33.0000 | Freq: Once | INTRAVENOUS | Status: AC | PRN
  Administered 2014-11-13: 33 via INTRAVENOUS

## 2014-11-13 NOTE — Progress Notes (Signed)
[facility] [address] [place], [place] [address] [phone]    Cardiology Nuclear Med Study  Warren [name] is a 73 y.o. male     MRN : [id number]     DOB: 15-Jan-1941  Procedure Date: 11/13/2014  Nuclear Med Background Indication for Stress Test:  Evaluation for Ischemia, and 10-22-2013 Cardiac CT: Plaque of Coronary Arteries History:  Previous Nuclear Study 2012([facility]):Cardiac CT NO CAD 10/22/13 Cardiac Risk Factors: Hypertension  Symptoms:  DOE;Vague chest pain   Nuclear Pre-Procedure Caffeine/Decaff Intake:  None> 12 hrs NPO After: 7:30pm   Lungs:  Clear O2 Sat: 98% on room air. IV 0.9% NS with Angio Cath:  22g  IV Site: R Antecubital x 1, tolerated well IV Started by:  Irven Baltimore, RN  Chest Size (in):  40 Cup Size: n/a  Height: 5\' 8"  (1.727 m)  Weight:  172 lb (78.019 kg)  BMI:  Body mass index is 26.16 kg/(m^2). Tech Comments:  No medications today. Patient took Diovan last night. Irven Baltimore, RN.    Nuclear Med Study 1 or 2 day study: 1 day  Stress Test Type:  Stress  Reading MD: N/A  Order Authorizing Provider:  Fransico Him, MD  Resting Radionuclide: Technetium 44m Sestamibi  Resting Radionuclide Dose: 11.0 mCi   Stress Radionuclide:  Technetium 18m Sestamibi  Stress Radionuclide Dose: 33.0 mCi           Stress Protocol Rest HR: 54 Stress HR: 129  Rest BP: 132/77 Stress BP: 191/66  Exercise Time (min): 10:30 METS: 12.50   Predicted Max HR: 145 bpm % Max HR: 88.97 bpm Rate Pressure Product: 00762   Dose of Adenosine (mg):  n/a Dose of Lexiscan: n/a mg  Dose of Atropine (mg): n/a Dose of Dobutamine: n/a mcg/kg/min (at max HR)  Stress Test Technologist: Ileene Hutchinson, EMT-P  Nuclear Technologist:  Earl Many, CNMT     Rest Procedure:  Myocardial perfusion imaging was performed at rest 45 minutes following the intravenous administration of Technetium 63m Sestamibi. Rest ECG: NSR - Normal EKG  Stress Procedure:   The patient exercised on the treadmill utilizing the Bruce Protocol for 10:30 minutes. The patient stopped due to SOB and denied any chest pain.  Technetium 37m Sestamibi was injected at peak exercise and myocardial perfusion imaging was performed after a brief delay. Stress ECG: No significant ST segment change suggestive of ischemia.  QPS Raw Data Images:  Normal; no motion artifact; normal heart/lung ratio. Stress Images:  There is decreased uptake in the septum. Rest Images:  There is decreased uptake in the septum. Subtraction (SDS):  Fixed septal artifact Transient Ischemic Dilatation (Normal <1.22):  0.95 Lung/Heart Ratio (Normal <0.45):  0.29  Quantitative Gated Spect Images QGS EDV:  107 ml QGS ESV:  36 ml  Impression Exercise Capacity:  Excellent exercise capacity. BP Response:  Normal blood pressure response. Clinical Symptoms:  No significant symptoms noted. ECG Impression:  There are scattered PVCs. Comparison with Prior Nuclear Study: No previous nuclear study performed  Overall Impression:  Low risk stress nuclear study with a small fixed distal septal defect which may be a small area of scar. No ischemia.  LV Ejection Fraction: 66%.  LV Wall Motion:  NL LV Function; NL Wall Motion   Pixie Casino, MD, [facility] Board Certified in Nuclear Cardiology Attending Cardiologist [place]

## 2014-11-17 ENCOUNTER — Telehealth: Payer: Self-pay | Admitting: Cardiology

## 2014-11-17 DIAGNOSIS — H02831 Dermatochalasis of right upper eyelid: Secondary | ICD-10-CM | POA: Diagnosis not present

## 2014-11-17 DIAGNOSIS — H11433 Conjunctival hyperemia, bilateral: Secondary | ICD-10-CM | POA: Diagnosis not present

## 2014-11-17 NOTE — Telephone Encounter (Signed)
New Msg   Pt returning call, please contact at [phone] after 12 pm.

## 2014-11-17 NOTE — Telephone Encounter (Signed)
Patient informed of nuclear stress test results and verbal understanding expressed.

## 2014-11-25 ENCOUNTER — Encounter (HOSPITAL_COMMUNITY): Payer: Self-pay | Admitting: *Deleted

## 2014-11-25 ENCOUNTER — Ambulatory Visit (HOSPITAL_COMMUNITY): Payer: Medicare Other | Admitting: Certified Registered Nurse Anesthetist

## 2014-11-25 ENCOUNTER — Encounter (HOSPITAL_COMMUNITY)
Admission: RE | Disposition: A | Discharge status: Home / Self Care | Payer: Self-pay | Source: Ambulatory Visit | Attending: Gastroenterology

## 2014-11-25 ENCOUNTER — Ambulatory Visit (HOSPITAL_COMMUNITY)
Admission: RE | Admit: 2014-11-25 | Discharge: 2014-11-25 | Disposition: A | Discharge status: Home / Self Care | Payer: Medicare Other | Source: Ambulatory Visit | Attending: Gastroenterology | Admitting: Gastroenterology

## 2014-11-25 DIAGNOSIS — Z87891 Personal history of nicotine dependence: Secondary | ICD-10-CM | POA: Insufficient documentation

## 2014-11-25 DIAGNOSIS — E785 Hyperlipidemia, unspecified: Secondary | ICD-10-CM | POA: Insufficient documentation

## 2014-11-25 DIAGNOSIS — Z825 Family history of asthma and other chronic lower respiratory diseases: Secondary | ICD-10-CM | POA: Insufficient documentation

## 2014-11-25 DIAGNOSIS — K449 Diaphragmatic hernia without obstruction or gangrene: Secondary | ICD-10-CM | POA: Insufficient documentation

## 2014-11-25 DIAGNOSIS — K222 Esophageal obstruction: Secondary | ICD-10-CM | POA: Diagnosis not present

## 2014-11-25 DIAGNOSIS — K219 Gastro-esophageal reflux disease without esophagitis: Secondary | ICD-10-CM | POA: Diagnosis not present

## 2014-11-25 DIAGNOSIS — Z8042 Family history of malignant neoplasm of prostate: Secondary | ICD-10-CM | POA: Diagnosis not present

## 2014-11-25 DIAGNOSIS — E780 Pure hypercholesterolemia: Secondary | ICD-10-CM | POA: Insufficient documentation

## 2014-11-25 DIAGNOSIS — J449 Chronic obstructive pulmonary disease, unspecified: Secondary | ICD-10-CM | POA: Diagnosis not present

## 2014-11-25 DIAGNOSIS — J984 Other disorders of lung: Secondary | ICD-10-CM | POA: Diagnosis not present

## 2014-11-25 DIAGNOSIS — G629 Polyneuropathy, unspecified: Secondary | ICD-10-CM | POA: Diagnosis not present

## 2014-11-25 DIAGNOSIS — Z803 Family history of malignant neoplasm of breast: Secondary | ICD-10-CM | POA: Diagnosis not present

## 2014-11-25 DIAGNOSIS — I700 Atherosclerosis of aorta: Secondary | ICD-10-CM | POA: Insufficient documentation

## 2014-11-25 DIAGNOSIS — G4733 Obstructive sleep apnea (adult) (pediatric): Secondary | ICD-10-CM | POA: Diagnosis not present

## 2014-11-25 DIAGNOSIS — Z801 Family history of malignant neoplasm of trachea, bronchus and lung: Secondary | ICD-10-CM | POA: Insufficient documentation

## 2014-11-25 DIAGNOSIS — Z888 Allergy status to other drugs, medicaments and biological substances status: Secondary | ICD-10-CM | POA: Insufficient documentation

## 2014-11-25 DIAGNOSIS — R1319 Other dysphagia: Secondary | ICD-10-CM | POA: Insufficient documentation

## 2014-11-25 DIAGNOSIS — Z85820 Personal history of malignant melanoma of skin: Secondary | ICD-10-CM | POA: Insufficient documentation

## 2014-11-25 DIAGNOSIS — Z808 Family history of malignant neoplasm of other organs or systems: Secondary | ICD-10-CM | POA: Diagnosis not present

## 2014-11-25 DIAGNOSIS — I10 Essential (primary) hypertension: Secondary | ICD-10-CM | POA: Insufficient documentation

## 2014-11-25 HISTORY — PX: BALLOON DILATION: SHX5330

## 2014-11-25 HISTORY — DX: Adverse effect of unspecified anesthetic, initial encounter: T41.45XA

## 2014-11-25 HISTORY — PX: ESOPHAGOGASTRODUODENOSCOPY (EGD) WITH PROPOFOL: SHX5813

## 2014-11-25 HISTORY — DX: Other complications of anesthesia, initial encounter: T88.59XA

## 2014-11-25 SURGERY — ESOPHAGOGASTRODUODENOSCOPY (EGD) WITH PROPOFOL
Anesthesia: Monitor Anesthesia Care

## 2014-11-25 MED ORDER — KETAMINE HCL 10 MG/ML IJ SOLN
INTRAMUSCULAR | Status: DC | PRN
  Administered 2014-11-25: 20 mg via INTRAVENOUS

## 2014-11-25 MED ORDER — ONDANSETRON HCL 4 MG/2ML IJ SOLN
INTRAMUSCULAR | Status: AC
  Filled 2014-11-25: qty 2

## 2014-11-25 MED ORDER — ONDANSETRON HCL 4 MG/2ML IJ SOLN
INTRAMUSCULAR | Status: DC | PRN
  Administered 2014-11-25: 4 mg via INTRAVENOUS

## 2014-11-25 MED ORDER — LIDOCAINE HCL (CARDIAC) 20 MG/ML IV SOLN
INTRAVENOUS | Status: AC
  Filled 2014-11-25: qty 5

## 2014-11-25 MED ORDER — PROPOFOL INFUSION 10 MG/ML OPTIME
INTRAVENOUS | Status: DC | PRN
  Administered 2014-11-25: 300 ug/kg/min via INTRAVENOUS

## 2014-11-25 MED ORDER — LACTATED RINGERS IV SOLN
INTRAVENOUS | Status: DC
  Administered 2014-11-25: 08:00:00 via INTRAVENOUS

## 2014-11-25 MED ORDER — LIDOCAINE HCL (CARDIAC) 20 MG/ML IV SOLN
INTRAVENOUS | Status: DC | PRN
  Administered 2014-11-25: 100 mg via INTRAVENOUS

## 2014-11-25 MED ORDER — PROPOFOL 10 MG/ML IV BOLUS
INTRAVENOUS | Status: AC
  Filled 2014-11-25: qty 20

## 2014-11-25 MED ORDER — KETAMINE HCL 10 MG/ML IJ SOLN
INTRAMUSCULAR | Status: AC
  Filled 2014-11-25: qty 1

## 2014-11-25 SURGICAL SUPPLY — 15 items
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
## 2014-11-25 NOTE — Transfer of Care (Signed)
Immediate Anesthesia Transfer of Care Note  Patient: Warren [name]  Procedure(s) Performed: Procedure(s) (LRB): ESOPHAGOGASTRODUODENOSCOPY (EGD) WITH PROPOFOL (N/A) BALLOON DILATION (N/A)  Patient Location: PACU  Anesthesia Type: MAC  Level of Consciousness: sedated, patient cooperative and responds to stimulation  Airway & Oxygen Therapy: Patient Spontanous Breathing and Patient connected to face mask oxgen  Post-op Assessment: Report given to PACU RN and Post -op Vital signs reviewed and stable  Post vital signs: Reviewed and stable  Complications: No apparent anesthesia complications

## 2014-11-25 NOTE — Op Note (Signed)
Problem: Chronic gastroesophageal reflux and chronic, infrequent esophageal dysphagia. Barium esophagram showed esophageal narrowing at the esophagogastric junction associated with a small hiatal hernia  Endoscopist: Earle Gell  Premedication: Propofol administered by anesthesia  Procedure: The patient was placed in the left lateral decubitus position. The Pentax gastroscope was passed through the posterior hypopharynx into the proximal esophagus without difficulty. The hypopharynx, larynx, and vocal cords appeared normal.  Esophagoscopy: The proximal, mid, and lower segments of the esophageal mucosa appeared normal. The squamocolumnar junction was regular in appearance and noted at 40 cm from the incisor teeth. There was no endoscopic evidence for the presence of esophageal stricture formation or esophageal obstruction. The lower esophageal sphincter felt somewhat tight as I traversed the distal esophagus and entered the stomach. Using the esophageal balloon dilator, the balloon was inflated from 15 mm to 18 mm at the esophagogastric junction without mucosal disruption indicating the absence of esophageal mucosal stricture formation.  Gastroscopy: There was a small hiatal hernia. Retroflexed view of the gastric cardia and fundus was normal. The gastric body, antrum, and pylorus appeared normal.  Duodenoscopy: The duodenal bulb and descending duodenum appeared normal.  Assessment: Normal esophagogastroduodenoscopy.  Recommendation: Continue proton pump inhibitor therapy to prevent heartburn. If dysphagia persists, schedule resolution esophageal manometry to look for achalasia.

## 2014-11-25 NOTE — Anesthesia Preprocedure Evaluation (Addendum)
Anesthesia Evaluation  Patient identified by MRN, date of birth, ID band Patient awake    Reviewed: Allergy & Precautions, H&P , NPO status , Patient's Chart, lab work & pertinent test results  Airway Mallampati: III  TM Distance: >3 FB Neck ROM: full    Dental no notable dental hx. (+) Teeth Intact, Dental Advisory Given   Pulmonary sleep apnea , COPDformer smoker,  Mild OSA breath sounds clear to auscultation  Pulmonary exam normal       Cardiovascular Exercise Tolerance: Good hypertension, + CAD negative cardio ROS  Rhythm:regular Rate:Normal     Neuro/Psych Acoustic neuroma left ear negative neurological ROS  negative psych ROS   GI/Hepatic negative GI ROS, Neg liver ROS,   Endo/Other  negative endocrine ROS  Renal/GU negative Renal ROS  negative genitourinary   Musculoskeletal   Abdominal   Peds  Hematology negative hematology ROS (+)   Anesthesia Other Findings   Reproductive/Obstetrics negative OB ROS                            Anesthesia Physical Anesthesia Plan  ASA: III  Anesthesia Plan: MAC   Post-op Pain Management:    Induction:   Airway Management Planned:   Additional Equipment:   Intra-op Plan:   Post-operative Plan:   Informed Consent:   Plan Discussed with: Surgeon  Anesthesia Plan Comments:         Anesthesia Quick Evaluation

## 2014-11-25 NOTE — Anesthesia Postprocedure Evaluation (Signed)
  Anesthesia Post-op Note  Patient: Warren [name]  Procedure(s) Performed: Procedure(s) (LRB): ESOPHAGOGASTRODUODENOSCOPY (EGD) WITH PROPOFOL (N/A) BALLOON DILATION (N/A)  Patient Location: PACU  Anesthesia Type: MAC  Level of Consciousness: awake and alert   Airway and Oxygen Therapy: Patient Spontanous Breathing  Post-op Pain: mild  Post-op Assessment: Post-op Vital signs reviewed, Patient's Cardiovascular Status Stable, Respiratory Function Stable, Patent Airway and No signs of Nausea or vomiting  Last Vitals:  Filed Vitals:   11/25/14 0850  BP: 164/89  Pulse: 55  Temp:   Resp: 17    Post-op Vital Signs: stable   Complications: No apparent anesthesia complications

## 2014-11-26 ENCOUNTER — Encounter (HOSPITAL_COMMUNITY): Payer: Self-pay | Admitting: Gastroenterology

## 2015-02-24 DIAGNOSIS — R05 Cough: Secondary | ICD-10-CM | POA: Diagnosis not present

## 2015-02-24 DIAGNOSIS — R509 Fever, unspecified: Secondary | ICD-10-CM | POA: Diagnosis not present

## 2015-02-24 DIAGNOSIS — J020 Streptococcal pharyngitis: Secondary | ICD-10-CM | POA: Diagnosis not present

## 2015-02-24 DIAGNOSIS — M791 Myalgia: Secondary | ICD-10-CM | POA: Diagnosis not present

## 2015-02-24 DIAGNOSIS — J101 Influenza due to other identified influenza virus with other respiratory manifestations: Secondary | ICD-10-CM | POA: Diagnosis not present

## 2015-04-01 DIAGNOSIS — L309 Dermatitis, unspecified: Secondary | ICD-10-CM | POA: Diagnosis not present

## 2015-04-01 DIAGNOSIS — L821 Other seborrheic keratosis: Secondary | ICD-10-CM | POA: Diagnosis not present

## 2015-04-01 DIAGNOSIS — Z85820 Personal history of malignant melanoma of skin: Secondary | ICD-10-CM | POA: Diagnosis not present

## 2015-04-01 DIAGNOSIS — D225 Melanocytic nevi of trunk: Secondary | ICD-10-CM | POA: Diagnosis not present

## 2015-04-01 DIAGNOSIS — D1801 Hemangioma of skin and subcutaneous tissue: Secondary | ICD-10-CM | POA: Diagnosis not present

## 2015-04-06 ENCOUNTER — Other Ambulatory Visit: Payer: Self-pay

## 2015-04-06 MED ORDER — SIMVASTATIN 10 MG PO TABS: 10.0000 mg | ORAL_TABLET | Freq: Every day | ORAL | Status: DC

## 2015-04-28 ENCOUNTER — Telehealth: Payer: Self-pay | Admitting: Internal Medicine

## 2015-04-28 MED ORDER — AZITHROMYCIN 250 MG PO TABS: ORAL_TABLET | ORAL | Status: DC

## 2015-04-28 NOTE — Telephone Encounter (Signed)
Patient notified. Rx sent to pharmacy. Nothing further needed.  

## 2015-04-28 NOTE — Telephone Encounter (Signed)
Offer Zpak and suggest Mucinex-DM 

## 2015-04-28 NOTE — Telephone Encounter (Signed)
Patient says he caught a cold about 15 days ago, it has settled into chest, causing chest/head congestion.  Has not had any fever, but feels like the chest congestion is worse, now has a deep, hacking cough.  Having wheezing, asthma-like symptoms.  Patient is going out of country in 12 days and wants to get this cleared up before he leaves.  Patient is coughing up dark yellow mucus.  When he first gets up in the morning, he coughs for a good 20 minutes to clear out his chest.   Allergies  Allergen Reactions  . Midazolam Other (See Comments)    Agitated caused by versed   Current Outpatient Prescriptions on File Prior to Visit  Medication Sig Dispense Refill  . antiseptic oral rinse (BIOTENE) LIQD 15 mLs by Mouth Rinse route at bedtime.    [name] aspirin 81 MG tablet Take 81 mg by mouth at bedtime.     . calcium carbonate (TUMS - DOSED IN MG ELEMENTAL CALCIUM) 500 MG chewable tablet Chew 1-2 tablets by mouth daily as needed for indigestion or heartburn.    . nitroGLYCERIN (NITROSTAT) 0.4 MG SL tablet Place 1 tablet (0.4 mg total) under the tongue every 5 (five) minutes as needed for chest pain. 25 tablet 1  . omeprazole (PRILOSEC) 40 MG capsule Take 40 mg by mouth daily before lunch. 1/2 hour before.    [name] OVER THE COUNTER MEDICATION Take 4 oz by mouth at bedtime. Promegranate Juice.    . Probiotic Product (PROBIOTIC PO) Take 1 tablet by mouth at bedtime.    . simvastatin (ZOCOR) 10 MG tablet Take 1 tablet (10 mg total) by mouth at bedtime. 90 tablet 3  . valACYclovir (VALTREX) 500 MG tablet Take 250 mg by mouth every other day. At night to suppress herpes gingivitis    . valsartan (DIOVAN) 80 MG tablet Take 80 mg by mouth at bedtime.     No current facility-administered medications on file prior to visit.

## 2015-05-14 ENCOUNTER — Telehealth: Payer: Self-pay | Admitting: Internal Medicine

## 2015-05-14 MED ORDER — AMOXICILLIN-POT CLAVULANATE 875-125 MG PO TABS: 1.0000 | ORAL_TABLET | Freq: Two times a day (BID) | ORAL | Status: DC

## 2015-05-14 MED ORDER — ALBUTEROL SULFATE HFA 108 (90 BASE) MCG/ACT IN AERS: 2.0000 | INHALATION_SPRAY | RESPIRATORY_TRACT | Status: DC | PRN

## 2015-05-14 NOTE — Telephone Encounter (Signed)
The discolored sputum does sound like an acute bronchitis.  Offer augmentin 875, # 14, 1 twice daily

## 2015-05-14 NOTE — Telephone Encounter (Signed)
Pt aware of Rx from CY; [facility] [address] is pharmacy Rx sent to.    Pt also wanted to see CY feels he should have rescue inhaler on hand-he had one in the past. CY Please advise. Thanks.

## 2015-05-14 NOTE — Telephone Encounter (Signed)
Ok to offer albuterol HFA inhaler    # 1, 2 puffs, every 4 hours as needed, refill 11

## 2015-05-14 NOTE — Telephone Encounter (Signed)
PT c/o cold symptoms off and on for a month.  Finished Zpak 2 weeks ago.  Started wheezing 2 days ago with prod cough (brown- yellow), chest tightness, tickle in throat.  Denies fever.  Please advise.  Allergies  Allergen Reactions  . Midazolam Other (See Comments)    Agitated caused by versed    Current Outpatient Prescriptions on File Prior to Visit  Medication Sig Dispense Refill  . antiseptic oral rinse (BIOTENE) LIQD 15 mLs by Mouth Rinse route at bedtime.    [name] aspirin 81 MG tablet Take 81 mg by mouth at bedtime.     . calcium carbonate (TUMS - DOSED IN MG ELEMENTAL CALCIUM) 500 MG chewable tablet Chew 1-2 tablets by mouth daily as needed for indigestion or heartburn.    . nitroGLYCERIN (NITROSTAT) 0.4 MG SL tablet Place 1 tablet (0.4 mg total) under the tongue every 5 (five) minutes as needed for chest pain. 25 tablet 1  . omeprazole (PRILOSEC) 40 MG capsule Take 40 mg by mouth daily before lunch. 1/2 hour before.    [name] OVER THE COUNTER MEDICATION Take 4 oz by mouth at bedtime. Promegranate Juice.    . Probiotic Product (PROBIOTIC PO) Take 1 tablet by mouth at bedtime.    . simvastatin (ZOCOR) 10 MG tablet Take 1 tablet (10 mg total) by mouth at bedtime. 90 tablet 3  . valACYclovir (VALTREX) 500 MG tablet Take 250 mg by mouth every other day. At night to suppress herpes gingivitis    . valsartan (DIOVAN) 80 MG tablet Take 80 mg by mouth at bedtime.     No current facility-administered medications on file prior to visit.

## 2015-05-14 NOTE — Telephone Encounter (Signed)
Spoke with pt, he is aware of albuterol inhaler being sent in to preferred pharmacy.  Nothing further needed.

## 2015-05-14 NOTE — Telephone Encounter (Signed)
Pt returning call.Warren [name] ° °

## 2015-05-14 NOTE — Telephone Encounter (Signed)
lmtcb X1 for pt  

## 2015-05-25 ENCOUNTER — Other Ambulatory Visit: Payer: Self-pay

## 2015-08-10 DIAGNOSIS — G4733 Obstructive sleep apnea (adult) (pediatric): Secondary | ICD-10-CM | POA: Diagnosis not present

## 2015-08-10 DIAGNOSIS — Z0000 Encounter for general adult medical examination without abnormal findings: Secondary | ICD-10-CM | POA: Diagnosis not present

## 2015-08-10 DIAGNOSIS — K219 Gastro-esophageal reflux disease without esophagitis: Secondary | ICD-10-CM | POA: Diagnosis not present

## 2015-08-10 DIAGNOSIS — N400 Enlarged prostate without lower urinary tract symptoms: Secondary | ICD-10-CM | POA: Diagnosis not present

## 2015-08-10 DIAGNOSIS — I10 Essential (primary) hypertension: Secondary | ICD-10-CM | POA: Diagnosis not present

## 2015-08-10 DIAGNOSIS — Z23 Encounter for immunization: Secondary | ICD-10-CM | POA: Diagnosis not present

## 2015-08-10 DIAGNOSIS — Z1389 Encounter for screening for other disorder: Secondary | ICD-10-CM | POA: Diagnosis not present

## 2015-08-10 DIAGNOSIS — C61 Malignant neoplasm of prostate: Secondary | ICD-10-CM | POA: Diagnosis not present

## 2015-08-10 DIAGNOSIS — E780 Pure hypercholesterolemia: Secondary | ICD-10-CM | POA: Diagnosis not present

## 2015-08-10 DIAGNOSIS — D333 Benign neoplasm of cranial nerves: Secondary | ICD-10-CM | POA: Diagnosis not present

## 2015-08-11 DIAGNOSIS — H2513 Age-related nuclear cataract, bilateral: Secondary | ICD-10-CM | POA: Diagnosis not present

## 2015-08-11 DIAGNOSIS — H52203 Unspecified astigmatism, bilateral: Secondary | ICD-10-CM | POA: Diagnosis not present

## 2015-08-12 ENCOUNTER — Encounter: Payer: Self-pay | Admitting: Cardiology

## 2015-08-21 DIAGNOSIS — H903 Sensorineural hearing loss, bilateral: Secondary | ICD-10-CM | POA: Diagnosis not present

## 2015-08-21 DIAGNOSIS — Z86018 Personal history of other benign neoplasm: Secondary | ICD-10-CM | POA: Diagnosis not present

## 2015-08-21 DIAGNOSIS — H6121 Impacted cerumen, right ear: Secondary | ICD-10-CM | POA: Diagnosis not present

## 2015-08-21 DIAGNOSIS — H9042 Sensorineural hearing loss, unilateral, left ear, with unrestricted hearing on the contralateral side: Secondary | ICD-10-CM | POA: Diagnosis not present

## 2015-08-21 DIAGNOSIS — H9041 Sensorineural hearing loss, unilateral, right ear, with unrestricted hearing on the contralateral side: Secondary | ICD-10-CM | POA: Diagnosis not present

## 2015-09-18 ENCOUNTER — Ambulatory Visit (INDEPENDENT_AMBULATORY_CARE_PROVIDER_SITE_OTHER): Payer: Medicare Other | Admitting: Internal Medicine

## 2015-09-18 ENCOUNTER — Encounter: Payer: Self-pay | Admitting: Internal Medicine

## 2015-09-18 VITALS — BP 114/62 | HR 64 | Ht 68.0 in | Wt 172.2 lb

## 2015-09-18 DIAGNOSIS — J209 Acute bronchitis, unspecified: Secondary | ICD-10-CM

## 2015-09-18 DIAGNOSIS — G4733 Obstructive sleep apnea (adult) (pediatric): Secondary | ICD-10-CM

## 2015-09-18 DIAGNOSIS — Z23 Encounter for immunization: Secondary | ICD-10-CM | POA: Diagnosis not present

## 2015-09-18 NOTE — Assessment & Plan Note (Signed)
PFTs are very good despite his remote smoking history. I think it is more appropriate to consider his pattern one of intermittent acute bronchitis Brother than chronic fixed obstructive airways disease.

## 2015-09-18 NOTE — Patient Instructions (Addendum)
Order- schedule unattended HOME Sleep Test     Dx OSA  Prevnar 13 pneumonia vaccine

## 2015-09-18 NOTE — Assessment & Plan Note (Signed)
He is trim and all he notices if he doesn't wear CPAP is occasional complaints about snoring from his wife. I don't know if he is really reacting to something about his CPAP or airflow. Plan-schedule unattended home sleep test. If that shows significant apnea then he agreed with proposal that we refer him for reconsideration of a new oral appliance to try.

## 2015-09-18 NOTE — Progress Notes (Signed)
[birth year]71 yoM former smoker, followed for OSA, Hx lung nodule, hx dyspnea  Son is pulmonologist  still has slight SOB at times; wears CPAP8when traveling to prevent snoring. LOV-10/06/10 Has had flu vaccine. Stable dyspnea on exertion. Did  very well on a wilderness trip altitude and horse riding. He wanted to talk about screening CT scan for lung cancer. Father died of lung cancer.  PFT Nov 11, 2010: Mild obstructive airways disease in small airways with insignificant response to bronchodilator. Increased diffusion capacity raised question of increased blood flow. FEV1 3.00/110%, FEV1/FVC 0.61, FEF 25-75% 1.23/49%. TLC 117%, DLCO 167%. CT chest 10/13/09 compared w/ 12-Mar-2005: IMPRESSION:  1. Stable small lung nodules.  2. No mediastinal or hilar adenopathy.  3. Stable hepatic cysts.  4. Mild distal esophageal wall thickening is a stable finding.  Provider: Lennice Sites, Amy Rogal    [birth year] yoM former smoker, followed for OSA, Hx lung nodule, hx dyspnea, complicated by scoliosis,   Son is pulmonologist FOLLOWS FOR:sob same,no cough,had CT chest 03-12-2013. Sister died w/ lymphoma CPAP 8/ [facility]- used more when travelling to control snoring- discussed.  On valacyclovir to suppress cold sores.  CT chest 02/27/13 IMPRESSION:  No subcutaneous lesion / mass at the site of the patient's palpable  abnormality (anterior right shoulder).  No evidence of metastatic disease in the chest.  Original Report Authenticated By: Julian Hy, M.D.  09/17/14- 49 yoM former smoker, followed for OSA, Hx lung nodule, hx dyspnea, complicated by scoliosis,   Son is pulmonologist FOLLOWS FOR: sob has not improved, no cough. CPAP 8/ [facility]- used more when travelling to control snoring- discussed.   09/18/15- 17 yoM former smoker, followed for OSA, Hx lung nodule, hx dyspnea, complicated by scoliosis,   Son is pulmonologist CPAP 8/ [facility] FOLLOWS FOR:Pt not wearing CPAP as often as he should-? reaction  to something the supplies are made with. DME is [facility]. He reports that whenever he tries wearing CPAP, after a couple of days he feels tight, irritated in his chest as if he were catching a cold. He wonders if he might be allergic to something about the plastic. When he does wear it he can't tell that it helps. We discussed retesting him and then considering doing without treatment or trying an oral appliance again. His dentist had made one years ago. Daytime breathing is usually good. He recognizes effect of aging. No routine cough or wheeze. Rides bike and hikes significant distances.  PFT 09/24/2014-within normal limits. Maybe minimal obstructive airways disease and minimal small airway response to bronchodilator. Normal lung volumes and diffusion. NPSG 05/21/99- moderate OSA, AHI 19 per hour, weight 165 pounds  ROS-see HPI Constitutional:   No-   weight loss, night sweats, fevers, chills, fatigue, lassitude. HEENT:   No-  headaches, difficulty swallowing, tooth/dental problems, sore throat,       No-  sneezing, itching, ear ache, nasal congestion, post nasal drip,  CV:  No-   chest pain, orthopnea, PND, swelling in lower extremities, anasarca,  dizziness, palpitations Resp: + Little shortness of breath with exertion or at rest.              No-   productive cough,  No non-productive cough,  No- coughing up of blood.              No-   change in color of mucus.  No- wheezing.   Skin: No-   rash or lesions. GI:  No-   heartburn, indigestion, abdominal pain, nausea, vomiting,  GU:  MS:  +  joint pain or swelling.   Neuro-     nothing unusual Psych:  No- change in mood or affect. No depression or anxiety.  No memory loss.  OBJ- Physical Exam General- Alert, Oriented, Affect-appropriate, Distress- none acute, trim Skin- rash-none, lesions- none, excoriation- none Lymphadenopathy- none Head- atraumatic            Eyes- Gross vision intact, PERRLA, conjunctivae and secretions clear             Ears- Hearing, canals-normal            Nose- Clear, no-Septal dev, mucus, polyps, erosion, perforation             Throat- Mallampati II , mucosa clear , drainage- none, tonsils- atrophic Neck- flexible , trachea midline, no stridor , thyroid nl, carotid no bruit Chest - symmetrical excursion , unlabored           Heart/CV- RRR , no murmur , no gallop  , no rub, nl s1 s2                           - JVD- none , edema- none, stasis changes- none, varices- none           Lung- clear to P&A, wheeze- none, cough- none , dullness-none, rub- none           Chest wall- + mild scoliosis. +Lipoma R axilla Abd-  Br/ Gen/ Rectal- Not done, not indicated Extrem- cyanosis- none, clubbing, none, atrophy- none, strength- nl Neuro- grossly intact to observation

## 2015-10-07 DIAGNOSIS — Z85820 Personal history of malignant melanoma of skin: Secondary | ICD-10-CM | POA: Diagnosis not present

## 2015-10-07 DIAGNOSIS — L570 Actinic keratosis: Secondary | ICD-10-CM | POA: Diagnosis not present

## 2015-10-07 DIAGNOSIS — D225 Melanocytic nevi of trunk: Secondary | ICD-10-CM | POA: Diagnosis not present

## 2015-10-07 DIAGNOSIS — L821 Other seborrheic keratosis: Secondary | ICD-10-CM | POA: Diagnosis not present

## 2015-10-23 DIAGNOSIS — S299XXA Unspecified injury of thorax, initial encounter: Secondary | ICD-10-CM | POA: Diagnosis not present

## 2015-10-23 DIAGNOSIS — M546 Pain in thoracic spine: Secondary | ICD-10-CM | POA: Diagnosis not present

## 2015-10-23 DIAGNOSIS — S22050A Wedge compression fracture of T5-T6 vertebra, initial encounter for closed fracture: Secondary | ICD-10-CM | POA: Diagnosis not present

## 2015-10-23 DIAGNOSIS — M47816 Spondylosis without myelopathy or radiculopathy, lumbar region: Secondary | ICD-10-CM | POA: Diagnosis not present

## 2015-10-23 DIAGNOSIS — S22060A Wedge compression fracture of T7-T8 vertebra, initial encounter for closed fracture: Secondary | ICD-10-CM | POA: Diagnosis not present

## 2015-10-23 DIAGNOSIS — S3992XA Unspecified injury of lower back, initial encounter: Secondary | ICD-10-CM | POA: Diagnosis not present

## 2015-10-23 DIAGNOSIS — M549 Dorsalgia, unspecified: Secondary | ICD-10-CM | POA: Diagnosis not present

## 2015-10-23 DIAGNOSIS — S32010A Wedge compression fracture of first lumbar vertebra, initial encounter for closed fracture: Secondary | ICD-10-CM | POA: Diagnosis not present

## 2015-11-02 ENCOUNTER — Telehealth: Payer: Self-pay | Admitting: Internal Medicine
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
## 2015-11-02 NOTE — Telephone Encounter (Signed)
Called spoke with pt and made aware of below. He will call to get these r/s once he comes back in town. Nothing further needed

## 2015-11-11 DIAGNOSIS — B009 Herpesviral infection, unspecified: Secondary | ICD-10-CM | POA: Diagnosis not present

## 2015-11-12 ENCOUNTER — Ambulatory Visit (INDEPENDENT_AMBULATORY_CARE_PROVIDER_SITE_OTHER): Payer: Medicare Other | Admitting: Cardiology

## 2015-11-12 ENCOUNTER — Encounter: Payer: Self-pay | Admitting: Cardiology

## 2015-11-12 ENCOUNTER — Ambulatory Visit: Payer: Medicare Other | Admitting: Internal Medicine

## 2015-11-12 VITALS — BP 110/80 | HR 82 | Ht 68.0 in | Wt 165.4 lb

## 2015-11-12 DIAGNOSIS — E785 Hyperlipidemia, unspecified: Secondary | ICD-10-CM | POA: Diagnosis not present

## 2015-11-12 DIAGNOSIS — I2510 Atherosclerotic heart disease of native coronary artery without angina pectoris: Secondary | ICD-10-CM

## 2015-11-12 DIAGNOSIS — I10 Essential (primary) hypertension: Secondary | ICD-10-CM

## 2015-11-12 NOTE — Patient Instructions (Signed)
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
## 2015-11-17 ENCOUNTER — Encounter: Payer: Self-pay | Admitting: Cardiology

## 2015-12-30 DIAGNOSIS — K120 Recurrent oral aphthae: Secondary | ICD-10-CM | POA: Diagnosis not present

## 2016-01-11 DIAGNOSIS — Z1211 Encounter for screening for malignant neoplasm of colon: Secondary | ICD-10-CM | POA: Diagnosis not present

## 2016-01-22 DIAGNOSIS — J209 Acute bronchitis, unspecified: Secondary | ICD-10-CM | POA: Diagnosis not present

## 2016-01-22 DIAGNOSIS — R05 Cough: Secondary | ICD-10-CM | POA: Diagnosis not present

## 2016-01-22 DIAGNOSIS — R0981 Nasal congestion: Secondary | ICD-10-CM | POA: Diagnosis not present

## 2016-01-22 DIAGNOSIS — J0100 Acute maxillary sinusitis, unspecified: Secondary | ICD-10-CM | POA: Diagnosis not present

## 2016-01-22 DIAGNOSIS — J069 Acute upper respiratory infection, unspecified: Secondary | ICD-10-CM | POA: Diagnosis not present

## 2016-02-01 DIAGNOSIS — J441 Chronic obstructive pulmonary disease with (acute) exacerbation: Secondary | ICD-10-CM | POA: Diagnosis not present

## 2016-02-01 DIAGNOSIS — J449 Chronic obstructive pulmonary disease, unspecified: Secondary | ICD-10-CM | POA: Diagnosis not present

## 2016-02-24 DIAGNOSIS — J441 Chronic obstructive pulmonary disease with (acute) exacerbation: Secondary | ICD-10-CM | POA: Diagnosis not present

## 2016-03-08 ENCOUNTER — Other Ambulatory Visit: Payer: Self-pay | Admitting: Internal Medicine

## 2016-03-08 DIAGNOSIS — M5030 Other cervical disc degeneration, unspecified cervical region: Secondary | ICD-10-CM

## 2016-03-08 DIAGNOSIS — J449 Chronic obstructive pulmonary disease, unspecified: Secondary | ICD-10-CM | POA: Diagnosis not present

## 2016-03-08 DIAGNOSIS — E7800 Pure hypercholesterolemia, unspecified: Secondary | ICD-10-CM | POA: Diagnosis not present

## 2016-03-08 DIAGNOSIS — M5090 Cervical disc disorder, unspecified, unspecified cervical region: Secondary | ICD-10-CM | POA: Diagnosis not present

## 2016-03-08 DIAGNOSIS — K219 Gastro-esophageal reflux disease without esophagitis: Secondary | ICD-10-CM | POA: Diagnosis not present

## 2016-03-08 DIAGNOSIS — I10 Essential (primary) hypertension: Secondary | ICD-10-CM | POA: Diagnosis not present

## 2016-03-09 ENCOUNTER — Encounter: Payer: Self-pay | Admitting: Internal Medicine

## 2016-03-09 ENCOUNTER — Telehealth: Payer: Self-pay | Admitting: Internal Medicine

## 2016-03-09 ENCOUNTER — Ambulatory Visit (INDEPENDENT_AMBULATORY_CARE_PROVIDER_SITE_OTHER): Payer: Medicare Other | Admitting: Internal Medicine

## 2016-03-09 ENCOUNTER — Other Ambulatory Visit (INDEPENDENT_AMBULATORY_CARE_PROVIDER_SITE_OTHER): Payer: Medicare Other

## 2016-03-09 ENCOUNTER — Ambulatory Visit (INDEPENDENT_AMBULATORY_CARE_PROVIDER_SITE_OTHER)
Admission: RE | Admit: 2016-03-09 | Discharge: 2016-03-09 | Disposition: A | Discharge status: Home / Self Care | Payer: Medicare Other | Source: Ambulatory Visit | Attending: Internal Medicine | Admitting: Internal Medicine

## 2016-03-09 VITALS — BP 116/70 | HR 87 | Ht 68.0 in | Wt 171.0 lb

## 2016-03-09 DIAGNOSIS — R0609 Other forms of dyspnea: Secondary | ICD-10-CM | POA: Diagnosis not present

## 2016-03-09 DIAGNOSIS — R0602 Shortness of breath: Secondary | ICD-10-CM | POA: Diagnosis not present

## 2016-03-09 LAB — CBC WITH DIFFERENTIAL/PLATELET
Basophils Absolute: 0 10*3/uL (ref 0.0–0.1)
Basophils Relative: 0 % (ref 0.0–3.0)
EOS ABS: 0 10*3/uL (ref 0.0–0.7)
Eosinophils Relative: 0.9 % (ref 0.0–5.0)
HCT: 31.1 % — ABNORMAL LOW (ref 39.0–52.0)
HEMOGLOBIN: 9.7 g/dL — AB (ref 13.0–17.0)
LYMPHS ABS: 1.3 10*3/uL (ref 0.7–4.0)
Lymphocytes Relative: 61.7 % — ABNORMAL HIGH (ref 12.0–46.0)
MCHC: 31.2 g/dL (ref 30.0–36.0)
MCV: 96.7 fl (ref 78.0–100.0)
MONO ABS: 0.1 10*3/uL (ref 0.1–1.0)
Monocytes Relative: 4.6 % (ref 3.0–12.0)
NEUTROS PCT: 32.8 % — AB (ref 43.0–77.0)
Neutro Abs: 0.7 10*3/uL — ABNORMAL LOW (ref 1.4–7.7)
Platelets: 271 10*3/uL (ref 150.0–400.0)
RBC: 3.21 Mil/uL — AB (ref 4.22–5.81)
RDW: 17.9 % — ABNORMAL HIGH (ref 11.5–15.5)

## 2016-03-09 LAB — BRAIN NATRIURETIC PEPTIDE: PRO B NATRI PEPTIDE: 46 pg/mL (ref 0.0–100.0)

## 2016-03-09 NOTE — Telephone Encounter (Signed)
Spoke with pt. States that for the past few months he has been having increased DOE and rapid heart rate. He contacted his PCP about the rapid heart rate. PCP wanted him to call CY about the DOE. DOE comes with any activity whatsoever. He would like to be seen and I offered an appointment with a NP but he declined. Pt only wants to see CY.  CY - can we work the pt in anywhere? Thanks.

## 2016-03-09 NOTE — Progress Notes (Signed)
09/12/[birth year]03/20/75 yoM former smoker, followed for OSA, Hx lung nodule, hx dyspnea  Son is pulmonologist  still has slight SOB at times; wears CPAP8when traveling to prevent snoring. LOV-10/06/10 Has had flu vaccine. Stable dyspnea on exertion. Did  very well on a wilderness trip altitude and horse riding. He wanted to talk about screening CT scan for lung cancer. Father died of lung cancer.  PFT 11-15-10: Mild obstructive airways disease in small airways with insignificant response to bronchodilator. Increased diffusion capacity raised question of increased blood flow. FEV1 3.00/110%, FEV1/FVC 0.61, FEF 25-75% 1.23/49%. TLC 117%, DLCO 167%. CT chest 10/13/09 compared w/ 03/17/2005: IMPRESSION:  1. Stable small lung nodules.  2. No mediastinal or hilar adenopathy.  3. Stable hepatic cysts.  4. Mild distal esophageal wall thickening is a stable finding.  Provider: Lennice Sites, Amy Rogal    10/16/14Apr 20, [age] yoM former smoker, followed for OSA, Hx lung nodule, hx dyspnea, complicated by scoliosis,   Son is pulmonologist FOLLOWS FOR:sob same,no cough,had CT chest Mar 17, 2013. Sister died w/ lymphoma CPAP 8/ [employer]- used more when travelling to control snoring- discussed.  On valacyclovir to suppress cold sores.  CT chest Mar 17, 2013 IMPRESSION:  No subcutaneous lesion / mass at the site of the patient's palpable  abnormality (anterior right shoulder).  No evidence of metastatic disease in the chest.  Original Report Authenticated By: Julian Hy, M.D.  09/17/14- 75 yoM former smoker, followed for OSA, Hx lung nodule, hx dyspnea, complicated by scoliosis,   Son is pulmonologist FOLLOWS FOR: sob has not improved, no cough. CPAP 8/ [employer]- used more when travelling to control snoring- discussed.   09/18/15- 75 yoM former smoker, followed for OSA, Hx lung nodule, hx dyspnea, complicated by scoliosis,   Son is pulmonologist CPAP 8/ [employer] FOLLOWS FOR:Pt not wearing CPAP as often as he should-? reaction  to something the supplies are made with. DME is [facility]. He reports that whenever he tries wearing CPAP, after a couple of days he feels tight, irritated in his chest as if he were catching a cold. He wonders if he might be allergic to something about the plastic. When he does wear it he can't tell that it helps. We discussed retesting him and then considering doing without treatment or trying an oral appliance again. His dentist had made one years ago. Daytime breathing is usually good. He recognizes effect of aging. No routine cough or wheeze. Rides bike and hikes significant distances.  PFT 09/24/2014-within normal limits. Maybe minimal obstructive airways disease and minimal small airway response to bronchodilator. Normal lung volumes and diffusion. NPSG 05/21/99- moderate OSA, AHI 19 per hour, weight 165 pounds  03/09/2016-75 year old male former smoker followed for OSA, history lung nodule, history dyspnea, complicated by scoliosis son is pulmonologist He had injured his back and chosen to cancel planned HST after last visit Cardiac status with noncritical CAD was evaluated by cardiology 11/12/2015 noting stable DOE at that time. He called today asking appointment for shortness of breath and rapid heartbeat and is worked in this afternoon. ACUTE VISIT: Pt states he has noticed increased SOB with exertion and rapid heart rate. Pt states this started in Feb 2017; takes longer than he feels comfortable with to return to "normal" state.  Weight up to 165 pounds December 15 to 171 pounds today. He denies any recognizable weight gain. He gives history that he was in [place] with family in February when he is in his wife both caught a "bad bug with much cough, sounding like  a viral bronchitis at that time. There were staying under condo which was not obviously dirtier moldy with no exposure to obvious environmental challenges. A primary physician in [place] gave an antibiotic without help. There was  substantial wheeze. He was seen by a pulmonologist in [place] in March who gave Depo-Medrol and a 5 day prednisone burst with another antibiotic and started Breo 100. Little residual cough now. Chest x-ray was negative in [place]. A hypersensitivity panel was done with elevations to several molds and pigeon droppings. He remains more dyspneic with exertion than usual for him, without wheeze or cough. His baseline was to hike 4 miles in less than an hour and bike 10 miles in an hour. He feels he can't do half that now. He is trying to begin exercising again, anticipating some physically exertional outdoor trips. They came back in early April. Continuing Breo. He notices his heart rate gets more rapid without irregular palpitation or syncope, and takes a while after exertion to slow down. There has been no leg pain or cramping. Office Spirometry 03/09/2016-mild obstruction small airways. FVC 4.17/102%, FEV1 2.69/87%, FEV1/FVC 0.64, FEF 25-75 percent 1.59. FEF 25-75 percent was normal on formal PFT in 2015.   ROS-see HPI Constitutional:   No-   weight loss, night sweats, fevers, chills, fatigue, lassitude. HEENT:   No-  headaches, difficulty swallowing, tooth/dental problems, sore throat,       No-  sneezing, itching, ear ache, nasal congestion, post nasal drip,  CV:  No-   chest pain, orthopnea, PND, swelling in lower extremities, anasarca,  dizziness, palpitations Resp: + Little shortness of breath with exertion or at rest.              No-   productive cough,  No non-productive cough,  No- coughing up of blood.              No-   change in color of mucus.  No- wheezing.   Skin: No-   rash or lesions. GI:  No-   heartburn, indigestion, abdominal pain, nausea, vomiting,  GU:  MS:  +  joint pain or swelling.   Neuro-     nothing unusual Psych:  No- change in mood or affect. No depression or anxiety.  No memory loss.  OBJ- Physical Exam   P 87 regular at rest General- Alert, Oriented,  Affect-appropriate, Distress- none acute, trim Skin- rash-none, lesions- none, excoriation- none Lymphadenopathy- none Head- atraumatic            Eyes- Gross vision intact, PERRLA, conjunctivae and secretions clear            Ears- Hearing, canals-normal            Nose- Clear, no-Septal dev, mucus, polyps, erosion, perforation             Throat- Mallampati II , mucosa clear , drainage- none, tonsils- atrophic Neck- flexible , trachea midline, no stridor , thyroid nl, carotid no bruit Chest - symmetrical excursion , unlabored           Heart/CV- RRR , no murmur , no gallop  , no rub, nl s1 s2                           - JVD- none , edema- none, stasis changes- none, varices- none           Lung- clear to P&A, wheeze- none, cough- none , dullness-none, rub- none  Chest wall- + mild scoliosis. +Lipoma R axilla Abd-  Br/ Gen/ Rectal- Not done, not indicated Extrem- cyanosis- none, clubbing, none, atrophy- none, strength- nl Neuro- grossly intact to observation

## 2016-03-09 NOTE — Telephone Encounter (Signed)
Attempted to contact patient again twice on Cell, 1 time on home phone, left messages on both phones. Awaiting call back from patient.

## 2016-03-09 NOTE — Assessment & Plan Note (Signed)
He probably had a viral bronchitis in February with residual bronchiolitis slow to clear and some deconditioning associated with lapse in his habitual exercise pattern. The sustained mild tachycardia probably reflects his Breo. I doubt, but we'll look for, PE, CHF. Don't know what to make of his abnormal hypersensitivity pneumonitis panel in [place]. We will repeat it. Plan-office spirometry, chest x-ray, labs for d-dimer, BNP, CBC with differential, total IgE, hypersensitivity pneumonia panel. Sample Arnuity Ellipta steroid inhaler to hold while he sees how he feels off Breo.  Okay to progress physical activity as tolerated

## 2016-03-09 NOTE — Telephone Encounter (Signed)
[phone], pt cb

## 2016-03-09 NOTE — Telephone Encounter (Signed)
Warren [name] spoke with the pt earlier this morning. He could not come in at the time CY wanted. CY has agreed to see him at 2:00 this afternoon. Nothing further was needed.

## 2016-03-09 NOTE — Telephone Encounter (Signed)
Per CY >> have pt come in now for appointment.  Sharyn Lull attempted to call pt. She had to leave a message.

## 2016-03-09 NOTE — Patient Instructions (Addendum)
Order- CXR- dyspnea on exertion              Lab- total IgE, Hypersensitivity pneumonia panel, D-dimer, BNP, CBC w diff   Dx dyspnea on exertion  Order- office spirometry  Stop Breo for observation.  Sample Arnuity steroid inhaler to hold. If you begin to notice increasing chest tightness, dry cough or wheeze off Breo, then start Arnuity:  Inhale 1 puff then rinse mouth, once daily maintenance. See what that does.

## 2016-03-09 NOTE — Telephone Encounter (Signed)
lmtcb x1 for pt. 

## 2016-03-10 LAB — IGE: IGE (IMMUNOGLOBULIN E), SERUM: 57 kU/L (ref <115)

## 2016-03-10 LAB — D-DIMER, QUANTITATIVE (NOT AT ARMC): D DIMER QUANT: 0.79 ug{FEU}/mL — AB (ref 0.00–0.48)

## 2016-03-14 LAB — HYPERSENSITIVITY PNUEMONITIS PROFILE

## 2016-03-16 ENCOUNTER — Ambulatory Visit
Admission: RE | Admit: 2016-03-16 | Discharge: 2016-03-16 | Disposition: A | Discharge status: Home / Self Care | Payer: Medicare Other | Source: Ambulatory Visit | Attending: Internal Medicine | Admitting: Internal Medicine

## 2016-03-16 DIAGNOSIS — M5030 Other cervical disc degeneration, unspecified cervical region: Secondary | ICD-10-CM

## 2016-03-16 DIAGNOSIS — M47812 Spondylosis without myelopathy or radiculopathy, cervical region: Secondary | ICD-10-CM | POA: Diagnosis not present

## 2016-03-17 DIAGNOSIS — D729 Disorder of white blood cells, unspecified: Secondary | ICD-10-CM | POA: Diagnosis not present

## 2016-03-18 ENCOUNTER — Encounter: Payer: Self-pay | Admitting: Hematology

## 2016-03-18 ENCOUNTER — Other Ambulatory Visit: Payer: Self-pay | Admitting: *Deleted

## 2016-03-18 ENCOUNTER — Ambulatory Visit (HOSPITAL_BASED_OUTPATIENT_CLINIC_OR_DEPARTMENT_OTHER): Payer: Medicare Other | Admitting: Hematology

## 2016-03-18 ENCOUNTER — Ambulatory Visit (HOSPITAL_BASED_OUTPATIENT_CLINIC_OR_DEPARTMENT_OTHER): Payer: Medicare Other

## 2016-03-18 ENCOUNTER — Other Ambulatory Visit: Payer: Self-pay | Admitting: Radiology

## 2016-03-18 ENCOUNTER — Telehealth: Payer: Self-pay | Admitting: Hematology

## 2016-03-18 ENCOUNTER — Other Ambulatory Visit: Payer: Medicare Other

## 2016-03-18 DIAGNOSIS — D696 Thrombocytopenia, unspecified: Secondary | ICD-10-CM | POA: Diagnosis not present

## 2016-03-18 DIAGNOSIS — D649 Anemia, unspecified: Secondary | ICD-10-CM

## 2016-03-18 DIAGNOSIS — D709 Neutropenia, unspecified: Secondary | ICD-10-CM | POA: Insufficient documentation

## 2016-03-18 DIAGNOSIS — D61818 Other pancytopenia: Secondary | ICD-10-CM

## 2016-03-18 DIAGNOSIS — Z806 Family history of leukemia: Secondary | ICD-10-CM

## 2016-03-18 DIAGNOSIS — Z803 Family history of malignant neoplasm of breast: Secondary | ICD-10-CM

## 2016-03-18 DIAGNOSIS — Z87891 Personal history of nicotine dependence: Secondary | ICD-10-CM

## 2016-03-18 DIAGNOSIS — Z832 Family history of diseases of the blood and blood-forming organs and certain disorders involving the immune mechanism: Secondary | ICD-10-CM | POA: Diagnosis not present

## 2016-03-18 DIAGNOSIS — D72819 Decreased white blood cell count, unspecified: Secondary | ICD-10-CM | POA: Diagnosis not present

## 2016-03-18 DIAGNOSIS — Z808 Family history of malignant neoplasm of other organs or systems: Secondary | ICD-10-CM

## 2016-03-18 DIAGNOSIS — Z831 Family history of other infectious and parasitic diseases: Secondary | ICD-10-CM

## 2016-03-18 DIAGNOSIS — Z801 Family history of malignant neoplasm of trachea, bronchus and lung: Secondary | ICD-10-CM

## 2016-03-18 DIAGNOSIS — Z8042 Family history of malignant neoplasm of prostate: Secondary | ICD-10-CM | POA: Diagnosis not present

## 2016-03-18 DIAGNOSIS — Z9889 Other specified postprocedural states: Secondary | ICD-10-CM | POA: Insufficient documentation

## 2016-03-18 DIAGNOSIS — Z9289 Personal history of other medical treatment: Secondary | ICD-10-CM

## 2016-03-18 LAB — CBC & DIFF AND RETIC
BASO%: 0.6 % (ref 0.0–2.0)
BASOS ABS: 0 10*3/uL (ref 0.0–0.1)
EOS ABS: 0 10*3/uL (ref 0.0–0.5)
EOS%: 0 % (ref 0.0–7.0)
HEMATOCRIT: 29 % — AB (ref 38.4–49.9)
HEMOGLOBIN: 8.6 g/dL — AB (ref 13.0–17.1)
Immature Retic Fract: 21.5 % — ABNORMAL HIGH (ref 3.00–10.60)
LYMPH%: 66.7 % — ABNORMAL HIGH (ref 14.0–49.0)
MCH: 29.4 pg (ref 27.2–33.4)
MCHC: 29.7 g/dL — ABNORMAL LOW (ref 32.0–36.0)
MCV: 99 fL — ABNORMAL HIGH (ref 79.3–98.0)
MONO#: 0 10*3/uL — ABNORMAL LOW (ref 0.1–0.9)
MONO%: 1.2 % (ref 0.0–14.0)
NEUT#: 0.5 10*3/uL — CL (ref 1.5–6.5)
NEUT%: 31.5 % — ABNORMAL LOW (ref 39.0–75.0)
Platelets: 215 10*3/uL (ref 140–400)
RBC: 2.93 10*6/uL — ABNORMAL LOW (ref 4.20–5.82)
RDW: 17.3 % — AB (ref 11.0–14.6)
RETIC %: 4.05 % — AB (ref 0.80–1.80)
RETIC CT ABS: 118.67 10*3/uL — AB (ref 34.80–93.90)
WBC: 1.6 10*3/uL — ABNORMAL LOW (ref 4.0–10.3)
lymph#: 1.1 10*3/uL (ref 0.9–3.3)

## 2016-03-18 LAB — TECHNOLOGIST REVIEW

## 2016-03-18 LAB — CHCC SMEAR

## 2016-03-18 LAB — COMPREHENSIVE METABOLIC PANEL
ALBUMIN: 3.7 g/dL (ref 3.5–5.0)
ALK PHOS: 46 U/L (ref 40–150)
ALT: 13 U/L (ref 0–55)
AST: 22 U/L (ref 5–34)
Anion Gap: 7 mEq/L (ref 3–11)
BILIRUBIN TOTAL: 1.6 mg/dL — AB (ref 0.20–1.20)
BUN: 18.1 mg/dL (ref 7.0–26.0)
CALCIUM: 8.9 mg/dL (ref 8.4–10.4)
CO2: 27 mEq/L (ref 22–29)
CREATININE: 1 mg/dL (ref 0.7–1.3)
Chloride: 108 mEq/L (ref 98–109)
EGFR: 69 mL/min/{1.73_m2} — ABNORMAL LOW (ref >90)
Glucose: 94 mg/dl (ref 70–140)
Potassium: 3.9 mEq/L (ref 3.5–5.1)
Sodium: 142 mEq/L (ref 136–145)
TOTAL PROTEIN: 6.3 g/dL — AB (ref 6.4–8.3)

## 2016-03-18 LAB — LACTATE DEHYDROGENASE: LDH: 208 U/L (ref 125–245)

## 2016-03-18 NOTE — Progress Notes (Signed)
[name]    HEMATOLOGY/ONCOLOGY CONSULTATION NOTE  Date of Service: 03/18/2016  Patient Care Team: Wenda Low, MD as PCP - General (Internal Medicine)  CHIEF COMPLAINTS/PURPOSE OF CONSULTATION:   Anemia and leukopenia  HISTORY OF PRESENTING ILLNESS:   Warren [name] is a wonderful 75 y.o. male who has been referred to [place] by Dr .Wenda Low, MD for evaluation and management of anemia and leukopenia/neutropenia.  Patient is a history of hypertension, COPD not on home oxygen, obstructive sleep apnea [mild does not use CPAP], BPH, spinal stenosis, peripheral neuropathy, prostate cancer status post radical prostatectomy, left acoustic neuroma status post surgery in 1999, left shoulder melanoma status post surgery in 2010.  Patient reports that he had a bad upper respiratory infection in March this year which was treated with intramuscular prednisone followed by a burst of oral prednisone and Azithromycin in early March this year. He notes that it took a while to clear. He notes that he had allergy testing which showed increased IgE when done in [place] but was normal when repeated here with pulmonology.  He was seen in follow-up by Dr. Baird Lyons for pulmonology on 03/09/2016 and had routine labs that showed a hemoglobin of 9.7 with an MCV of 96.7, WBC count of 2.1k with an Humphrey of 700. He subsequently followed up with Dr. Deforest Hoyles on 03/17/2016 and was noted to have a hemoglobin of 8.8 with an MCV of 98.3, WBC count of 1.8k with an Hermleigh of 400. Platelets were normal at 243k  Of note his previous labs done on 08/10/2015 with his primary care physician showed a normal hemoglobin of 13.9 with an MCV of 93.6, WBC count of 5.3k. And platelets of 176k.  In addition to his recent significant upper respiratory tract infection, he notes travel to [place] on 03/07/2016 for week. He notes that he gets recurrent cold sores on his lip and is on Valacyclovir for HSV prophylaxis. He notes that he recently had a  painful "canker sore" and was told by his primary care physician to changes toothpaste and increase his Valtrex dose. This was in February 2017.  He notes that he had received typhoid and hepatitis A vaccination in 2017. Notes history of blood transfusion in 2003 when he had shoulder surgery a horse related accident and received 3 units of PRBCs.  No fevers or chills. No night sweats. Unexplained weight loss. No enlarged lymph nodes. No abdominal pain. No chest pain. No previous history of anemia. Note some increased fatigue and decreased exercise tolerance over the last few months. No focal bone pains. No new skin rashes currently. No headaches.. No history of bleeding. Other than his recent upper respiratory infection notes no history of recurrent infections.  Has been having chronic neck pains and recently had an MRI of his cervical spine 03/16/2016 by his primary care physician which showed multilevel cervical spondylosis causing spinal and foraminal stenosis no significant change from 08/09/2011.  Denies any tick bites or other insect bites. Was started on Breo for COPD in [place] but was subsequently discontinued. No other significant medication changes recently. Takes Tums and Centrum Silver over-the-counter no other significant supplements.   MEDICAL HISTORY:  Past Medical History  Diagnosis Date  . Pulmonary nodule   . Multiple trauma      horse accident 2003 L SHOULDER  . DJD (degenerative joint disease)   . Diffuse esophageal spasm   . Diverticulosis   . Peripheral neuropathy ([place])   . Spinal stenosis   . Oral  herpes   . Systolic hypertension   . Cancer [facility])     Prostate, Melanoma- lt. Shoulder (no further problems since 2010  . COPD (chronic obstructive pulmonary disease) (HCC)     Dr. Annamaria Boots follows  . OSA (obstructive sleep apnea)     mild, rare cpap use  . Complication of anesthesia     versed gave the adverse reaction pt. ,became aggitated  Recurrent HSV related  cold sores. Acoustic neuroma left 1999 - s/p surgery. Pruritus ani Injury in 2003 related to a horse accident resulting in hydrothorax, pneumothorax, fractured ribs, dislocated left shoulder, rotator cuff tear on the left, left elbow damage and left ulnar nerve damage.   SURGICAL HISTORY: Past Surgical History  Procedure Laterality Date  . Pilonidal cyst / sinus excision    . Acoustic neuroma left ear  1999  . Radical prostatectomy    . Multiple procedures for left arm      ELBOW,RIB FX PNEUMOTHORAX AFTER FALLING OFF MOUNTAIN  . Colonoscopy with propofol N/A 10/01/2013    Procedure: COLONOSCOPY WITH PROPOFOL;  Surgeon: Garlan Fair, MD;  Location: [facility];  Service: Endoscopy;  Laterality: N/A;  . Eye surgery      cosmetic surgery"brow lift" 4'15  . ,laceration to head in accident with hourse    . Esophagogastroduodenoscopy (egd) with propofol N/A 11/25/2014    Procedure: ESOPHAGOGASTRODUODENOSCOPY (EGD) WITH PROPOFOL;  Surgeon: Garlan Fair, MD;  Location: [facility];  Service: Endoscopy;  Laterality: N/A;  . Balloon dilation N/A 11/25/2014    Procedure: BALLOON DILATION;  Surgeon: Garlan Fair, MD;  Location: [facility];  Service: Endoscopy;  Laterality: N/A;    SOCIAL HISTORY: Social History   Social History  . Marital Status: Married    Spouse Name: N/A  . Number of Children: N/A  . Years of Education: N/A   Occupational History  . Not on file.   Social History Main Topics  . Smoking status: Former Smoker -- 1.00 packs/day for 40 years    Quit date: 11/29/1995  . Smokeless tobacco: Never Used  . Alcohol Use: Yes     Comment: 1 drink per day  . Drug Use: No  . Sexual Activity: Not on file   Other Topics Concern  . Not on file   Social History Narrative   CPAP 8 advanced   Patient drinks 5 cups of caffeine daily   Exercises 3-4 times weekly   Son is a pulmonologist    FAMILY HISTORY: Family History  Problem Relation Age of Onset  .  Emphysema Father   . Asthma Father   . Lung cancer Father   . Breast cancer Sister   . Breast cancer Sister   . Skin cancer Brother   . Prostate cancer Brother    Brother with a recent diagnosis of hemachromatosis Brother with CLL/SLL Mother had anemia, thalassemia. Hepatitis C with liver cancer  ALLERGIES:  is allergic to midazolam.  MEDICATIONS:  Current Outpatient Prescriptions  Medication Sig Dispense Refill  . aspirin 81 MG tablet Take 81 mg by mouth at bedtime.     . calcium carbonate (TUMS - DOSED IN MG ELEMENTAL CALCIUM) 500 MG chewable tablet Chew 1-2 tablets by mouth daily as needed for indigestion or heartburn.    . nitroGLYCERIN (NITROSTAT) 0.4 MG SL tablet Place 1 tablet (0.4 mg total) under the tongue every 5 (five) minutes as needed for chest pain. 25 tablet 1  . omeprazole (PRILOSEC) 40 MG capsule Take 40  mg by mouth daily before lunch. 1/2 hour before.    . Probiotic Product (PROBIOTIC PO) Take 1 tablet by mouth at bedtime. Reported on 03/09/2016    . simvastatin (ZOCOR) 10 MG tablet Take 1 tablet (10 mg total) by mouth at bedtime. 90 tablet 3  . valACYclovir (VALTREX) 500 MG tablet Take 250 mg by mouth every other day. At night to suppress herpes gingivitis    . valsartan (DIOVAN) 80 MG tablet Take 80 mg by mouth at bedtime.     No current facility-administered medications for this visit.    REVIEW OF SYSTEMS:    10 Point review of Systems was done is negative except as noted above.  PHYSICAL EXAMINATION: ECOG PERFORMANCE STATUS: 1 - Symptomatic but completely ambulatory  . Filed Vitals:   03/18/16 1213  BP: 130/68  Pulse: 72  Temp: 98.2 F (36.8 C)  Resp: 18   Filed Weights   03/18/16 1213  Weight: 169 lb 6.4 oz (76.839 kg)   .Body mass index is 25.76 kg/(m^2).  GENERAL:alert, in no acute distress and comfortable SKIN: skin color, texture, turgor are normal, no rashes or significant lesions EYES: normal, conjunctiva are pink and non-injected,  sclera clear OROPHARYNX:no exudate, no erythema and lips, buccal mucosa, and tongue normal  NECK: supple, no JVD, thyroid normal size, non-tender, without nodularity LYMPH:  no palpable lymphadenopathy in the cervical, axillary or inguinal LUNGS: clear to auscultation With somewhat decreased entry bilaterally, no wheezing HEART: regular rate & rhythm,  no murmurs and no lower extremity edema ABDOMEN: abdomen soft, non-tender, normoactive bowel sounds , no palpable hepatosplenomegaly Musculoskeletal: no cyanosis of digits. PSYCH: alert & oriented x 3 with fluent speech NEURO: no focal motor deficits.  LABORATORY DATA:  I have reviewed the data as listed  . CBC Latest Ref Rng 03/18/2016 03/09/2016  WBC 4.0 - 10.3 10e3/uL 1.6(L) 2.1 Repeated and verified X2.(L)  Hemoglobin 13.0 - 17.1 g/dL 8.6(L) 9.7(L)  Hematocrit 38.4 - 49.9 % 29.0(L) 31.1(L)  Platelets 140 - 400 10e3/uL 215 271.0   .. Lab Results  Component Value Date   RETICCTPCT 4.05* 03/18/2016   RBC 2.93* 03/18/2016   RETICCTABS 118.67* 03/18/2016    CBC    Component Value Date/Time   WBC 1.6* 03/18/2016 1503   WBC 2.1 Repeated and verified X2.* 03/09/2016 1510   RBC 2.93* 03/18/2016 1503   RBC 3.21* 03/09/2016 1510   HGB 8.6* 03/18/2016 1503   HGB 9.7* 03/09/2016 1510   HCT 29.0* 03/18/2016 1503   HCT 31.1* 03/09/2016 1510   PLT 215 03/18/2016 1503   PLT 271.0 03/09/2016 1510   MCV 99.0* 03/18/2016 1503   MCV 96.7 03/09/2016 1510   MCH 29.4 03/18/2016 1503   MCHC 29.7* 03/18/2016 1503   MCHC 31.2 03/09/2016 1510   RDW 17.3* 03/18/2016 1503   RDW 17.9* 03/09/2016 1510   LYMPHSABS 1.1 03/18/2016 1503   LYMPHSABS 1.3 03/09/2016 1510   MONOABS 0.0* 03/18/2016 1503   MONOABS 0.1 03/09/2016 1510   EOSABS 0.0 03/18/2016 1503   EOSABS 0.0 03/09/2016 1510   BASOSABS 0.0 03/18/2016 1503   BASOSABS 0.0 03/09/2016 1510   . CMP Latest Ref Rng 03/18/2016 11/13/2013 10/11/2013  Glucose 70 - 140 mg/dl 94 - 93  BUN 7.0 -  26.0 mg/dL 18.1 - 17  Creatinine 0.7 - 1.3 mg/dL 1.0 - 1.0  Sodium 136 - 145 mEq/L 142 - 139  Potassium 3.5 - 5.1 mEq/L 3.9 - 4.1  Chloride 96 - 112 mEq/L - -  105  CO2 22 - 29 mEq/L 27 - 30  Calcium 8.4 - 10.4 mg/dL 8.9 - 8.6  Total Protein 6.4 - 8.3 g/dL 6.3(L) - 6.1  Total Bilirubin 0.20 - 1.20 mg/dL 1.60(H) - 1.4(H)  Alkaline Phos 40 - 150 U/L 46 - 48  AST 5 - 34 U/L 22 - 20  ALT 0 - 55 U/L '13 18 13   '$ . Lab Results  Component Value Date   LDH 208 03/18/2016   Component     Latest Ref Rng 03/18/2016  HCV Ab     0.0 - 0.9 s/co ratio <0.1  Comment      Comment  Vitamin B12     211 - 946 pg/mL 391  EBV VCA IgM     0.0 - 35.9 U/mL <36.0  EBV VCA IgG     0.0 - 17.9 U/mL >600.0 (H)  RA Latex Turbid.     0.0 - 13.9 IU/mL <10.0  HIV     Non Reactive Non Reactive     RADIOGRAPHIC STUDIES: I have personally reviewed the radiological images as listed and agreed with the findings in the report. Dg Chest 2 View  03/09/2016  CLINICAL DATA:  Two months of shortness of breath with exertion, history of COPD, pulmonary nodules, former smoker. EXAM: CHEST  2 VIEW COMPARISON:  PA and lateral chest x-ray dated October 08, 2008 and chest CT scan of October 22, 2013. FINDINGS: The lungs are adequately inflated. There is no focal infiltrate. There is no pleural effusion. No pulmonary parenchymal masses are observed. The heart and pulmonary vascularity are normal. The mediastinum is normal in width. There is a fracture of the lateral aspect of the left eighth and ninth ribs without definite healing. These were not visible on the most recent chest exam which was a CT scan of February 28, 2013. IMPRESSION: There is no active cardiopulmonary disease. There are fractures of the lateral aspects of the left eighth and ninth ribs which appear to be acute or subacute. Electronically Signed   By: David  Martinique M.D.   On: 03/09/2016 16:26   Mr Cervical Spine Wo Contrast  03/16/2016  CLINICAL DATA:  Cervical  disc degeneration. Neck pain with right-sided pain. EXAM: MRI CERVICAL SPINE WITHOUT CONTRAST TECHNIQUE: Multiplanar, multisequence MR imaging of the cervical spine was performed. No intravenous contrast was administered. COMPARISON:  Cervical MRI 08/09/2011 FINDINGS: Accentuated cervical lordosis. Normal alignment. No fracture or mass. Several hemangiomata are noted in the vertebral bodies which appear benign. Spinal cord signal normal. No cord compression or cord lesion. Cervical medullary junction normal. C2-3: Mild foraminal narrowing due to facet hypertrophy bilaterally. C3-4: Mild disc and facet degeneration. Moderate foraminal narrowing bilaterally. C4-5: Disc degeneration and spurring. Bilateral facet hypertrophy. Mild spinal stenosis and moderate foraminal narrowing bilaterally. No change from the prior study C5-6: Mild degenerative change without significant spinal stenosis. Moderate foraminal narrowing bilaterally C6-7: Moderate disc degeneration and disc space narrowing. Diffuse uncinate spurring causing moderate to severe foraminal encroachment bilaterally. C7-T1: Bilateral facet hypertrophy causing moderate to severe foraminal narrowing bilaterally. Mild spinal stenosis. IMPRESSION: Multilevel cervical spondylosis causing spinal and foraminal stenosis as described above. No significant change from 08/09/2011. Electronically Signed   By: Franchot Gallo M.D.   On: 03/16/2016 16:02    ASSESSMENT & PLAN:   75 year old Caucasian male with  1) Acute/Subacute Anemia (with high normal MCV) + leukopenia/Neutropenia - new since September 2016. Patient has normal platelet counts. Slightly increased reticulocyte count with normal LDH.  This makes aplastic anemia less likely. B12 within normal limits.   - Patient had a bad upper respiratory tract infection in early March 2017. Could be viral or ?mycoplasm and this can certainly be a cause of temporary bone marrow suppression. Especially with mildly  increased bilirubin and some atypical lymphocytes on smear. -HIV nonreactive, hepatitis C negative -No overt stigmata of chronic liver disease. -No overt evidence of autoimmune disease which would be read to present at this age. Rheumatoid factor is negative. -Cannot rule out MDS. -Rule out chronic lymphoproliferative process.  Plan -Peripheral blood smear -RBC folate -flow cytometry  -CT-guided bone marrow biopsy on 03/21/2016. -Other workup labs as ordered. -Counseled on neutropenic precautions and avoiding individuals of obvious infections. Consuming food only. -Patient recommended immediate attention if he has any fevers. -No indication for PRBC transfusion at this time. -no indication for G-CSF at this time pending completion of workup. -Recommended immediate emergency room evaluation and shortness of breath or chest pain or fevers with his neutropenia. -Patient's PCP note suggests he was on Bactrim starting on 03/08/2016 however patient did not report this. We'll have my nurse call the patient on Monday and confirmed this since Bactrim could also cause cytopenias. And can cause a hemolytic anemia in the setting of G6PD deficiency. -Return to care in one week treated 4 days after bone marrow biopsy with repeat labs.  . Orders Placed This Encounter  Procedures  . CT Biopsy    Standing Status: Future     Number of Occurrences:      Standing Expiration Date: 03/18/2017    Order Specific Question:  Lab orders requested (DO NOT place separate lab orders, these will be automatically ordered during procedure specimen collection):    Answer:  Surgical Pathology     Comments:  flow cytometry, molecular studies    Order Specific Question:  Reason for Exam (SYMPTOM  OR DIAGNOSIS REQUIRED)    Answer:  unilateral bone marrow biopsy (Aplastic anemia)    Order Specific Question:  Preferred imaging location?    Answer:  [facility]  . CBC & Diff and Retic    Standing Status: Future      Number of Occurrences: 1     Standing Expiration Date: 04/22/2017  . Comprehensive metabolic panel    Standing Status: Future     Number of Occurrences: 1     Standing Expiration Date: 03/18/2017  . Smear    Standing Status: Future     Number of Occurrences: 1     Standing Expiration Date: 03/18/2017  . Lactate dehydrogenase (LDH)    Standing Status: Future     Number of Occurrences: 1     Standing Expiration Date: 03/18/2017  . Vitamin B12    Standing Status: Future     Number of Occurrences: 1     Standing Expiration Date: 03/18/2017  . Folate RBC    Standing Status: Future     Number of Occurrences: 1     Standing Expiration Date: 03/18/2017  . TSH    Standing Status: Future     Number of Occurrences: 1     Standing Expiration Date: 03/18/2017  . Ferritin    Standing Status: Future     Number of Occurrences: 1     Standing Expiration Date: 03/18/2017  . Iron and TIBC    Standing Status: Future     Number of Occurrences: 1     Standing Expiration Date: 03/18/2017  . Human parvovirus DNA detection  by PCR    Standing Status: Future     Number of Occurrences: 1     Standing Expiration Date: 03/18/2017  . Flow Cytometry    Pancytopenia    Standing Status: Future     Number of Occurrences: 1     Standing Expiration Date: 03/18/2017  . Hepatitis C antibody (reflex if positive)    Standing Status: Future     Number of Occurrences: 1     Standing Expiration Date: 03/18/2017  . Epstein-Barr virus VCA, IgM    Standing Status: Future     Number of Occurrences: 1     Standing Expiration Date: 03/18/2017  . Epstein-Barr virus VCA, IgG    Standing Status: Future     Number of Occurrences: 1     Standing Expiration Date: 03/18/2017  . Mycoplasma Pneumoniae,IgG,IgM    Standing Status: Future     Number of Occurrences: 1     Standing Expiration Date: 03/18/2017  . ANA, IFA (with reflex)    Standing Status: Future     Number of Occurrences: 1     Standing Expiration Date: 03/18/2017    . Rheumatoid factor    Standing Status: Future     Number of Occurrences: 1     Standing Expiration Date: 03/18/2017  . Multiple Myeloma Panel (SPEP&IFE w/QIG)    Standing Status: Future     Number of Occurrences:      Standing Expiration Date: 03/20/2017  . Kappa/lambda light chains    Standing Status: Future     Number of Occurrences:      Standing Expiration Date: 04/24/2017  . Sedimentation rate    Standing Status: Future     Number of Occurrences:      Standing Expiration Date: 03/20/2017  . C-reactive protein    Standing Status: Future     Number of Occurrences:      Standing Expiration Date: 03/20/2017  . Type and screen    Standing Status: Future     Number of Occurrences:      Standing Expiration Date: 03/18/2017     All of the patients questions were answered with apparent satisfaction. The patient knows to call the clinic with any problems, questions or concerns.  I spent 60 minutes counseling the patient face to face. The total time spent in the appointment was 60 minutes and more than 50% was on counseling and direct patient cares.    Sullivan Lone MD [place] AAHIVMS [facility] Hematology/Oncology Physician [facility]  (Office):       [phone] (Work cell):  [phone] (Fax):           [phone]  03/18/2016 1:03 PM

## 2016-03-18 NOTE — Telephone Encounter (Signed)
Gave and printed appt sched and avs for pt for April °

## 2016-03-19 LAB — HEPATITIS C ANTIBODY (REFLEX): HCV Ab: <0.1 s/co ratio (ref 0.0–0.9)

## 2016-03-19 LAB — EPSTEIN-BARR VIRUS VCA, IGG

## 2016-03-19 LAB — VITAMIN B12: VITAMIN B 12: 391 pg/mL (ref 211–946)

## 2016-03-19 LAB — RHEUMATOID FACTOR

## 2016-03-19 LAB — HIV ANTIBODY (ROUTINE TESTING W REFLEX): HIV SCREEN 4TH GENERATION: NONREACTIVE

## 2016-03-19 LAB — EPSTEIN-BARR VIRUS VCA, IGM

## 2016-03-21 ENCOUNTER — Ambulatory Visit (HOSPITAL_COMMUNITY)
Admission: RE | Admit: 2016-03-21 | Discharge: 2016-03-21 | Disposition: A | Discharge status: Home / Self Care | Payer: Medicare Other | Source: Ambulatory Visit | Attending: Hematology | Admitting: Hematology

## 2016-03-21 ENCOUNTER — Other Ambulatory Visit: Payer: Self-pay | Admitting: Hematology

## 2016-03-21 ENCOUNTER — Other Ambulatory Visit: Payer: Self-pay | Admitting: *Deleted

## 2016-03-21 ENCOUNTER — Encounter (HOSPITAL_COMMUNITY): Payer: Self-pay

## 2016-03-21 DIAGNOSIS — G4733 Obstructive sleep apnea (adult) (pediatric): Secondary | ICD-10-CM | POA: Insufficient documentation

## 2016-03-21 DIAGNOSIS — D709 Neutropenia, unspecified: Secondary | ICD-10-CM

## 2016-03-21 DIAGNOSIS — Z87891 Personal history of nicotine dependence: Secondary | ICD-10-CM | POA: Diagnosis not present

## 2016-03-21 DIAGNOSIS — D61818 Other pancytopenia: Secondary | ICD-10-CM | POA: Diagnosis present

## 2016-03-21 DIAGNOSIS — J449 Chronic obstructive pulmonary disease, unspecified: Secondary | ICD-10-CM | POA: Insufficient documentation

## 2016-03-21 DIAGNOSIS — N400 Enlarged prostate without lower urinary tract symptoms: Secondary | ICD-10-CM | POA: Diagnosis not present

## 2016-03-21 DIAGNOSIS — D649 Anemia, unspecified: Secondary | ICD-10-CM | POA: Insufficient documentation

## 2016-03-21 DIAGNOSIS — D4622 Refractory anemia with excess of blasts 2: Secondary | ICD-10-CM | POA: Insufficient documentation

## 2016-03-21 DIAGNOSIS — Z9079 Acquired absence of other genital organ(s): Secondary | ICD-10-CM | POA: Insufficient documentation

## 2016-03-21 DIAGNOSIS — D72818 Other decreased white blood cell count: Secondary | ICD-10-CM | POA: Insufficient documentation

## 2016-03-21 DIAGNOSIS — Z8546 Personal history of malignant neoplasm of prostate: Secondary | ICD-10-CM | POA: Insufficient documentation

## 2016-03-21 DIAGNOSIS — Z7982 Long term (current) use of aspirin: Secondary | ICD-10-CM | POA: Diagnosis not present

## 2016-03-21 DIAGNOSIS — R911 Solitary pulmonary nodule: Secondary | ICD-10-CM | POA: Diagnosis not present

## 2016-03-21 DIAGNOSIS — G629 Polyneuropathy, unspecified: Secondary | ICD-10-CM | POA: Insufficient documentation

## 2016-03-21 DIAGNOSIS — Z85820 Personal history of malignant melanoma of skin: Secondary | ICD-10-CM | POA: Insufficient documentation

## 2016-03-21 DIAGNOSIS — F1010 Alcohol abuse, uncomplicated: Secondary | ICD-10-CM | POA: Insufficient documentation

## 2016-03-21 DIAGNOSIS — I110 Hypertensive heart disease with heart failure: Secondary | ICD-10-CM | POA: Insufficient documentation

## 2016-03-21 DIAGNOSIS — M4800 Spinal stenosis, site unspecified: Secondary | ICD-10-CM | POA: Diagnosis not present

## 2016-03-21 DIAGNOSIS — I10 Essential (primary) hypertension: Secondary | ICD-10-CM | POA: Diagnosis not present

## 2016-03-21 DIAGNOSIS — D72819 Decreased white blood cell count, unspecified: Secondary | ICD-10-CM | POA: Diagnosis not present

## 2016-03-21 LAB — BONE MARROW EXAM

## 2016-03-21 LAB — CBC WITH DIFFERENTIAL/PLATELET
BASOS PCT: 0 %
Basophils Absolute: 0 10*3/uL (ref 0.0–0.1)
Eosinophils Absolute: 0 10*3/uL (ref 0.0–0.7)
Eosinophils Relative: 1 %
HEMATOCRIT: 27.6 % — AB (ref 39.0–52.0)
HEMOGLOBIN: 8.4 g/dL — AB (ref 13.0–17.0)
Lymphocytes Relative: 75 %
Lymphs Abs: 1.2 10*3/uL (ref 0.7–4.0)
MCH: 30.1 pg (ref 26.0–34.0)
MCHC: 30.4 g/dL (ref 30.0–36.0)
MCV: 98.9 fL (ref 78.0–100.0)
MONO ABS: 0 10*3/uL — AB (ref 0.1–1.0)
Monocytes Relative: 1 %
NEUTROS PCT: 23 %
Neutro Abs: 0.4 10*3/uL — ABNORMAL LOW (ref 1.7–7.7)
Platelets: 231 10*3/uL (ref 150–400)
RBC: 2.79 MIL/uL — AB (ref 4.22–5.81)
RDW: 17 % — ABNORMAL HIGH (ref 11.5–15.5)
WBC: 1.6 10*3/uL — ABNORMAL LOW (ref 4.0–10.5)

## 2016-03-21 LAB — FERRITIN: FERRITIN: 16 ng/mL — AB (ref 22–316)

## 2016-03-21 LAB — PROTIME-INR
INR: 1.18 (ref 0.00–1.49)
Prothrombin Time: 14.8 seconds (ref 11.6–15.2)

## 2016-03-21 LAB — TSH: TSH: 0.643 m[IU]/L (ref 0.320–4.118)

## 2016-03-21 LAB — ANTINUCLEAR ANTIBODIES, IFA: ANTINUCLEAR ANTIBODIES, IFA: NEGATIVE

## 2016-03-21 LAB — IRON AND TIBC
%SAT: 24 % (ref 20–55)
IRON: 79 ug/dL (ref 42–163)
TIBC: 328 ug/dL (ref 202–409)
UIBC: 248 ug/dL (ref 117–376)

## 2016-03-21 LAB — FOLATE RBC
Folate, Hemolysate: 439.3 ng/mL
Folate, RBC: 1615 ng/mL (ref >498)
Hematocrit: 27.2 % — ABNORMAL LOW (ref 37.5–51.0)

## 2016-03-21 LAB — HUMAN PARVOVIRUS DNA DETECTION BY PCR: Parvovirus B19, PCR: NEGATIVE

## 2016-03-21 MED ORDER — FENTANYL CITRATE (PF) 100 MCG/2ML IJ SOLN
INTRAMUSCULAR | Status: AC
  Filled 2016-03-21: qty 4

## 2016-03-21 MED ORDER — SODIUM CHLORIDE 0.9 % IV SOLN
INTRAVENOUS | Status: DC
  Administered 2016-03-21: 07:00:00 via INTRAVENOUS

## 2016-03-21 MED ORDER — FENTANYL CITRATE (PF) 100 MCG/2ML IJ SOLN
INTRAMUSCULAR | Status: AC | PRN
  Administered 2016-03-21: 25 ug via INTRAVENOUS
  Administered 2016-03-21: 50 ug via INTRAVENOUS

## 2016-03-21 NOTE — H&P (Signed)
   Chief Complaint: Patient was seen in consultation today for CT-guided bone marrow biopsy (outpatient)  Referring Physician(s): Kale,Gautam Kishore  Supervising Physician: Shick, Trevor  History of Present Illness: Warren [name] is a 75 y.o. male with past medical history significant for hypertension, COPD, obstructive sleep apnea, BPH, spinal stenosis, peripheral neuropathy, prostate cancer status post radical prostatectomy, left acoustic neuroma status post surgery 1999, left shoulder melanoma status post surgery 2010. He presents now with persistent anemia, leukopenia/neutropenia since September 2016 and is scheduled for CT-guided bone marrow biopsy today for further evaluation.  Past Medical History  Diagnosis Date  . Pulmonary nodule   . Multiple trauma      horse accident 2003 L SHOULDER  . DJD (degenerative joint disease)   . Diffuse esophageal spasm   . Diverticulosis   . Peripheral neuropathy (HCC)   . Spinal stenosis   . Oral herpes   . Systolic hypertension   . Cancer (HCC)     Prostate, Melanoma- lt. Shoulder (no further problems since 2010  . COPD (chronic obstructive pulmonary disease) (HCC)     Dr. Young follows  . OSA (obstructive sleep apnea)     mild, rare cpap use  . Complication of anesthesia     versed gave the adverse reaction pt. ,became aggitated    Past Surgical History  Procedure Laterality Date  . Pilonidal cyst / sinus excision    . Acoustic neuroma left ear  1999  . Radical prostatectomy    . Multiple procedures for left arm      ELBOW,RIB FX PNEUMOTHORAX AFTER FALLING OFF MOUNTAIN  . Colonoscopy with propofol N/A 10/01/2013    Procedure: COLONOSCOPY WITH PROPOFOL;  Surgeon: Martin K Johnson, MD;  Location: [facility];  Service: Endoscopy;  Laterality: N/A;  . Eye surgery      cosmetic surgery"brow lift" 4'15  . ,laceration to head in accident with hourse    . Esophagogastroduodenoscopy (egd) with propofol N/A 11/25/2014    Procedure:  ESOPHAGOGASTRODUODENOSCOPY (EGD) WITH PROPOFOL;  Surgeon: Martin K Johnson, MD;  Location: [facility];  Service: Endoscopy;  Laterality: N/A;  . Balloon dilation N/A 11/25/2014    Procedure: BALLOON DILATION;  Surgeon: Martin K Johnson, MD;  Location: [facility];  Service: Endoscopy;  Laterality: N/A;    Allergies: Midazolam  Medications: Prior to Admission medications   Medication Sig Start Date End Date Taking? Authorizing Provider  aspirin 81 MG tablet Take 81 mg by mouth at bedtime.    Yes Historical Provider, MD  calcium carbonate (TUMS - DOSED IN MG ELEMENTAL CALCIUM) 500 MG chewable tablet Chew 1-2 tablets by mouth daily as needed for indigestion or heartburn.   Yes Historical Provider, MD  omeprazole (PRILOSEC) 40 MG capsule Take 40 mg by mouth daily before lunch. 1/2 hour before.   Yes Historical Provider, MD  simvastatin (ZOCOR) 10 MG tablet Take 1 tablet (10 mg total) by mouth at bedtime. 04/06/15  Yes Traci R Turner, MD  valACYclovir (VALTREX) 500 MG tablet Take 250 mg by mouth every other day. At night to suppress herpes gingivitis 09/12/13  Yes Clinton D Young, MD  valsartan (DIOVAN) 80 MG tablet Take 80 mg by mouth at bedtime.   Yes Historical Provider, MD  nitroGLYCERIN (NITROSTAT) 0.4 MG SL tablet Place 1 tablet (0.4 mg total) under the tongue every 5 (five) minutes as needed for chest pain. 11/11/14   Traci R Turner, MD  Probiotic Product (PROBIOTIC PO) Take 1 tablet by mouth at   bedtime. Reported on 03/09/2016    Historical Provider, MD     Family History  Problem Relation Age of Onset  . Emphysema Father   . Asthma Father   . Lung cancer Father   . Breast cancer Sister   . Breast cancer Sister   . Skin cancer Brother   . Prostate cancer Brother     Social History   Social History  . Marital Status: Married    Spouse Name: N/A  . Number of Children: N/A  . Years of Education: N/A   Social History Main Topics  . Smoking status: Former Smoker -- 1.00  packs/day for 40 years    Quit date: 11/29/1995  . Smokeless tobacco: Never Used  . Alcohol Use: Yes     Comment: 1 drink per day  . Drug Use: No  . Sexual Activity: Not Asked   Other Topics Concern  . None   Social History Narrative   CPAP 8 advanced   Patient drinks 5 cups of caffeine daily   Exercises 3-4 times weekly   Son is a pulmonologist      Review of Systems  Constitutional: Negative for fever and chills.  Respiratory: Positive for shortness of breath. Negative for cough.   Cardiovascular: Negative for chest pain.  Gastrointestinal: Negative for nausea, vomiting, abdominal pain and blood in stool.  Genitourinary: Negative for dysuria and hematuria.  Musculoskeletal: Positive for back pain.  Neurological: Negative for headaches.  Psychiatric/Behavioral: The patient is nervous/anxious.     Vital Signs: Blood pressure 112/69, temperature 98.2, heart rate 70, respirations 16, O2 sat 99% room air   Physical Exam  Constitutional: He is oriented to person, place, and time. He appears well-developed and well-nourished.  Cardiovascular: Normal rate and regular rhythm.   Pulmonary/Chest: Effort normal and breath sounds normal.  Abdominal: Soft. Bowel sounds are normal. There is no tenderness.  Musculoskeletal: Normal range of motion. He exhibits no edema.  Neurological: He is alert and oriented to person, place, and time.    Mallampati Score:     Imaging: Dg Chest 2 View  03/09/2016  CLINICAL DATA:  Two months of shortness of breath with exertion, history of COPD, pulmonary nodules, former smoker. EXAM: CHEST  2 VIEW COMPARISON:  PA and lateral chest x-ray dated October 08, 2008 and chest CT scan of October 22, 2013. FINDINGS: The lungs are adequately inflated. There is no focal infiltrate. There is no pleural effusion. No pulmonary parenchymal masses are observed. The heart and pulmonary vascularity are normal. The mediastinum is normal in width. There is a  fracture of the lateral aspect of the left eighth and ninth ribs without definite healing. These were not visible on the most recent chest exam which was a CT scan of February 28, 2013. IMPRESSION: There is no active cardiopulmonary disease. There are fractures of the lateral aspects of the left eighth and ninth ribs which appear to be acute or subacute. Electronically Signed   By: David  Jordan M.D.   On: 03/09/2016 16:26   Mr Cervical Spine Wo Contrast  03/16/2016  CLINICAL DATA:  Cervical disc degeneration. Neck pain with right-sided pain. EXAM: MRI CERVICAL SPINE WITHOUT CONTRAST TECHNIQUE: Multiplanar, multisequence MR imaging of the cervical spine was performed. No intravenous contrast was administered. COMPARISON:  Cervical MRI 08/09/2011 FINDINGS: Accentuated cervical lordosis. Normal alignment. No fracture or mass. Several hemangiomata are noted in the vertebral bodies which appear benign. Spinal cord signal normal. No cord compression or cord lesion. Cervical   medullary junction normal. C2-3: Mild foraminal narrowing due to facet hypertrophy bilaterally. C3-4: Mild disc and facet degeneration. Moderate foraminal narrowing bilaterally. C4-5: Disc degeneration and spurring. Bilateral facet hypertrophy. Mild spinal stenosis and moderate foraminal narrowing bilaterally. No change from the prior study C5-6: Mild degenerative change without significant spinal stenosis. Moderate foraminal narrowing bilaterally C6-7: Moderate disc degeneration and disc space narrowing. Diffuse uncinate spurring causing moderate to severe foraminal encroachment bilaterally. C7-T1: Bilateral facet hypertrophy causing moderate to severe foraminal narrowing bilaterally. Mild spinal stenosis. IMPRESSION: Multilevel cervical spondylosis causing spinal and foraminal stenosis as described above. No significant change from 08/09/2011. Electronically Signed   By: Franchot Gallo M.D.   On: 03/16/2016 16:02    Labs:  CBC:  Recent Labs   03/09/16 1510 03/18/16 1503 03/21/16 0720  WBC 2.1 Repeated and verified X2.* 1.6* 1.6*  HGB 9.7* 8.6* 8.4*  HCT 31.1* 29.0* 27.6*  PLT 271.0 215 231    COAGS:  Recent Labs  03/21/16 0720  INR 1.18    BMP:  Recent Labs  03/18/16 1503  NA 142  K 3.9  CO2 27  GLUCOSE 94  BUN 18.1  CALCIUM 8.9  CREATININE 1.0    LIVER FUNCTION TESTS:  Recent Labs  03/18/16 1503  BILITOT 1.60*  AST 22  ALT 13  ALKPHOS 46  PROT 6.3*  ALBUMIN 3.7    TUMOR MARKERS: No results for input(s): AFPTM, CEA, CA199, CHROMGRNA in the last 8760 hours.  Assessment and Plan: 75 y.o. male with past medical history significant for hypertension, COPD, obstructive sleep apnea, BPH, spinal stenosis, peripheral neuropathy, prostate cancer status post radical prostatectomy, left acoustic neuroma status post surgery 1999, left shoulder melanoma status post surgery 2010. He presents now with persistent anemia, leukopenia/neutropenia since September 2016 and is scheduled for CT-guided bone marrow biopsy today for further evaluation.Risks and benefits discussed with the patient/wife including, but not limited to bleeding, infection, damage to adjacent structures or low yield requiring additional tests.All of the patient's questions were answered, patient is agreeable to proceed.Consent signed and in chart.      Thank you for this interesting consult.  I greatly enjoyed meeting REUBIN BUSHNELL and look forward to participating in their care.  A copy of this report was sent to the requesting provider on this date.  Electronically Signed: D. Rowe Robert 03/21/2016, 9:07 AM   I spent a total of 20 minutes in face to face in clinical consultation, greater than 50% of which was counseling/coordinating care for CT guided bone marrow biopsy

## 2016-03-21 NOTE — Sedation Documentation (Signed)
Patient denies pain and is resting comfortably.  

## 2016-03-21 NOTE — Discharge Instructions (Signed)
Bone Marrow Aspiration and Bone Marrow Biopsy, Care After Refer to this sheet in the next few weeks. These instructions provide you with information about caring for yourself after your procedure. Your health care provider may also give you more specific instructions. Your treatment has been planned according to current medical practices, but problems sometimes occur. Call your health care provider if you have any problems or questions after your procedure. WHAT TO EXPECT AFTER THE PROCEDURE After your procedure, it is common to have:  Soreness or tenderness around the puncture site.  Bruising. HOME CARE INSTRUCTIONS  Take medicines only as directed by your health care provider.  Follow your health care provider's instructions about:  Puncture site care.  Bandage (dressing) changes and removal. Remove and shower in 24 hours.  Keep site clean and dry.  Bathe and shower as directed by your health care provider.  Check your puncture site every day for signs of infection. Watch for:  Redness, swelling, or pain.  Fluid, blood, or pus.  Return to your normal activities as directed by your health care provider.  Keep all follow-up visits as directed by your health care provider. This is important. SEEK MEDICAL CARE IF:  You have a fever.  You have uncontrollable bleeding.  You have redness, swelling, or pain at the site of your puncture.  You have fluid, blood, or pus coming from your puncture site.   This information is not intended to replace advice given to you by your health care provider. Make sure you discuss any questions you have with your health care provider.   Document Released: 06/03/2005 Document Revised: 03/31/2015 Document Reviewed: 11/05/2014 Elsevier Interactive Patient Education 2016 [employer]  Moderate Conscious Sedation, Adult, Care After Refer to this sheet in the next few weeks. These instructions provide you with information on caring for yourself  after your procedure. Your health care provider may also give you more specific instructions. Your treatment has been planned according to current medical practices, but problems sometimes occur. Call your health care provider if you have any problems or questions after your procedure. WHAT TO EXPECT AFTER THE PROCEDURE  After your procedure:  You may feel sleepy, clumsy, and have poor balance for several hours.  Vomiting may occur if you eat too soon after the procedure. HOME CARE INSTRUCTIONS  Do not participate in any activities where you could become injured for at least 24 hours. Do not:  Drive.  Swim.  Ride a bicycle.  Operate heavy machinery.  Cook.  Use power tools.  Climb ladders.  Work from a high place.  Do not make important decisions or sign legal documents until you are improved.  If you vomit, drink water, juice, or soup when you can drink without vomiting. Make sure you have little or no nausea before eating solid foods.  Only take over-the-counter or prescription medicines for pain, discomfort, or fever as directed by your health care provider.  Make sure you and your family fully understand everything about the medicines given to you, including what side effects may occur.  You should not drink alcohol, take sleeping pills, or take medicines that cause drowsiness for at least 24 hours.  If you smoke, do not smoke without supervision.  If you are feeling better, you may resume normal activities 24 hours after you were sedated.  Keep all appointments with your health care provider. SEEK MEDICAL CARE IF:  Your skin is pale or bluish in color.  You continue to feel nauseous or  vomit.  Your pain is getting worse and is not helped by medicine.  You have bleeding or swelling.  You are still sleepy or feeling clumsy after 24 hours. SEEK IMMEDIATE MEDICAL CARE IF:  You develop a rash.  You have difficulty breathing.  You develop any type of allergic  problem.  You have a fever. MAKE SURE YOU:  Understand these instructions.  Will watch your condition.  Will get help right away if you are not doing well or get worse.   This information is not intended to replace advice given to you by your health care provider. Make sure you discuss any questions you have with your health care provider.   Document Released: 09/04/2013 Document Revised: 12/05/2014 Document Reviewed: 09/04/2013 Elsevier Interactive Patient Education [employer].

## 2016-03-21 NOTE — Procedures (Signed)
Successful RT ILIAC BM ASP AND CORE BX NO COMP STABLE PATH PENDING 

## 2016-03-22 LAB — FLOW CYTOMETRY

## 2016-03-24 ENCOUNTER — Ambulatory Visit (HOSPITAL_BASED_OUTPATIENT_CLINIC_OR_DEPARTMENT_OTHER): Payer: Medicare Other | Admitting: Hematology

## 2016-03-24 ENCOUNTER — Other Ambulatory Visit (HOSPITAL_COMMUNITY)
Admission: RE | Admit: 2016-03-24 | Discharge: 2016-03-24 | Disposition: A | Discharge status: Home / Self Care | Payer: Medicare Other | Source: Ambulatory Visit | Attending: Hematology | Admitting: Hematology

## 2016-03-24 ENCOUNTER — Telehealth: Payer: Self-pay | Admitting: Hematology

## 2016-03-24 ENCOUNTER — Encounter: Payer: Self-pay | Admitting: Hematology

## 2016-03-24 ENCOUNTER — Encounter: Payer: Self-pay | Admitting: *Deleted

## 2016-03-24 ENCOUNTER — Other Ambulatory Visit (HOSPITAL_BASED_OUTPATIENT_CLINIC_OR_DEPARTMENT_OTHER): Payer: Medicare Other

## 2016-03-24 ENCOUNTER — Telehealth: Payer: Self-pay | Admitting: *Deleted

## 2016-03-24 VITALS — BP 139/60 | HR 75 | Temp 98.1°F | Resp 18 | Ht 67.0 in | Wt 170.4 lb

## 2016-03-24 DIAGNOSIS — D4622 Refractory anemia with excess of blasts 2: Secondary | ICD-10-CM | POA: Diagnosis not present

## 2016-03-24 DIAGNOSIS — D709 Neutropenia, unspecified: Secondary | ICD-10-CM

## 2016-03-24 DIAGNOSIS — D72819 Decreased white blood cell count, unspecified: Secondary | ICD-10-CM

## 2016-03-24 DIAGNOSIS — D649 Anemia, unspecified: Secondary | ICD-10-CM

## 2016-03-24 LAB — CBC & DIFF AND RETIC
BASO%: 0 % (ref 0.0–2.0)
Basophils Absolute: 0 10e3/uL (ref 0.0–0.1)
EOS%: 0.6 % (ref 0.0–7.0)
Eosinophils Absolute: 0 10e3/uL (ref 0.0–0.5)
HCT: 28.2 % — ABNORMAL LOW (ref 38.4–49.9)
HGB: 8.4 g/dL — ABNORMAL LOW (ref 13.0–17.1)
Immature Retic Fract: 23.6 % — ABNORMAL HIGH (ref 3.00–10.60)
LYMPH%: 61.7 % — ABNORMAL HIGH (ref 14.0–49.0)
MCH: 29.5 pg (ref 27.2–33.4)
MCHC: 29.8 g/dL — ABNORMAL LOW (ref 32.0–36.0)
MCV: 98.9 fL — ABNORMAL HIGH (ref 79.3–98.0)
MONO#: 0.1 10e3/uL (ref 0.1–0.9)
MONO%: 3.9 % (ref 0.0–14.0)
NEUT#: 0.5 10e3/uL — CL (ref 1.5–6.5)
NEUT%: 33.8 % — ABNORMAL LOW (ref 39.0–75.0)
Platelets: 220 10e3/uL (ref 140–400)
RBC: 2.85 10e6/uL — ABNORMAL LOW (ref 4.20–5.82)
RDW: 16.9 % — ABNORMAL HIGH (ref 11.0–14.6)
Retic %: 3.61 % — ABNORMAL HIGH (ref 0.80–1.80)
Retic Ct Abs: 102.89 10e3/uL — ABNORMAL HIGH (ref 34.80–93.90)
WBC: 1.5 10e3/uL — ABNORMAL LOW (ref 4.0–10.3)
lymph#: 1 10e3/uL (ref 0.9–3.3)

## 2016-03-24 NOTE — Telephone Encounter (Signed)
spoke w/ pt confirmed rx dates & times

## 2016-03-24 NOTE — Telephone Encounter (Signed)
Per staff message and POF I have scheduled appts. Advised scheduler of appts. JMW  

## 2016-03-24 NOTE — Progress Notes (Signed)
Faxed information to [facility]: [phone] (request 1st available appointment)

## 2016-03-24 NOTE — Telephone Encounter (Signed)
Gave pt appt & avs °

## 2016-03-24 NOTE — Telephone Encounter (Signed)
Gave pt appt for may & avs °

## 2016-03-25 ENCOUNTER — Ambulatory Visit: Payer: Medicare Other | Admitting: Hematology

## 2016-03-25 LAB — KAPPA/LAMBDA LIGHT CHAINS
Ig Kappa Free Light Chain: 20.71 mg/L — ABNORMAL HIGH (ref 3.30–19.40)
Ig Lambda Free Light Chain: 46.83 mg/L — ABNORMAL HIGH (ref 5.71–26.30)
Kappa/Lambda FluidC Ratio: 0.44 (ref 0.26–1.65)

## 2016-03-25 LAB — SEDIMENTATION RATE: Sedimentation Rate-Westergren: 2 mm/hr (ref 0–30)

## 2016-03-25 LAB — C-REACTIVE PROTEIN: CRP: 0.3 mg/L (ref 0.0–4.9)

## 2016-03-28 LAB — MULTIPLE MYELOMA PANEL, SERUM
ALPHA 1: 0.2 g/dL (ref 0.0–0.4)
ALPHA2 GLOB SERPL ELPH-MCNC: 0.5 g/dL (ref 0.4–1.0)
Albumin SerPl Elph-Mcnc: 3.4 g/dL (ref 2.9–4.4)
Albumin/Glob SerPl: 1.4 (ref 0.7–1.7)
B-GLOBULIN SERPL ELPH-MCNC: 0.9 g/dL (ref 0.7–1.3)
Gamma Glob SerPl Elph-Mcnc: 0.9 g/dL (ref 0.4–1.8)
Globulin, Total: 2.5 g/dL (ref 2.2–3.9)
IGG (IMMUNOGLOBIN G), SERUM: 833 mg/dL (ref 700–1600)
IGM (IMMUNOGLOBIN M), SRM: 76 mg/dL (ref 15–143)
IgA, Qn, Serum: 129 mg/dL (ref 61–437)
TOTAL PROTEIN: 5.9 g/dL — AB (ref 6.0–8.5)

## 2016-03-29 ENCOUNTER — Other Ambulatory Visit: Payer: Medicare Other

## 2016-03-29 LAB — FLOW CYTOMETRY

## 2016-03-29 NOTE — Progress Notes (Signed)
[name]    HEMATOLOGY/ONCOLOGY CONSULTATION NOTE  Date of Service: .03/24/2016   Patient Care Team: Wenda Low, MD as PCP - General (Internal Medicine)  CHIEF COMPLAINTS/PURPOSE OF CONSULTATION:   Anemia and leukopenia  HISTORY OF PRESENTING ILLNESS:   Warren [name] is a wonderful 75 y.o. male who has been referred to [place] by Dr .Wenda Low, MD for evaluation and management of anemia and leukopenia/neutropenia.  Patient is a history of hypertension, COPD not on home oxygen, obstructive sleep apnea [mild does not use CPAP], BPH, spinal stenosis, peripheral neuropathy, prostate cancer status post radical prostatectomy, left acoustic neuroma status post surgery in 1999, left shoulder melanoma status post surgery in 2010.  Patient reports that he had a bad upper respiratory infection in March this year which was treated with intramuscular prednisone followed by a burst of oral prednisone and Azithromycin in early March this year. He notes that it took a while to clear. He notes that he had allergy testing which showed increased IgE when done in [place] but was normal when repeated here with pulmonology.  He was seen in follow-up by Dr. Baird Lyons for pulmonology on 03/09/2016 and had routine labs that showed a hemoglobin of 9.7 with an MCV of 96.7, WBC count of 2.1k with an [place] of 700. He subsequently followed up with Dr. Deforest Hoyles on 03/17/2016 and was noted to have a hemoglobin of 8.8 with an MCV of 98.3, WBC count of 1.8k with an [place] of 400. Platelets were normal at 243k  Of note his previous labs done on 08/10/2015 with his primary care physician showed a normal hemoglobin of 13.9 with an MCV of 93.6, WBC count of 5.3k. And platelets of 176k.  In addition to his recent significant upper respiratory tract infection, he notes travel to [place] on 03/07/2016 for week. He notes that he gets recurrent cold sores on his lip and is on Valacyclovir for HSV prophylaxis. He notes that he recently  had a painful "canker sore" and was told by his primary care physician to changes toothpaste and increase his Valtrex dose. This was in February 2017.  He notes that he had received typhoid and hepatitis A vaccination in 2017. Notes history of blood transfusion in 2003 when he had shoulder surgery a horse related accident and received 3 units of PRBCs.  No fevers or chills. No night sweats. Unexplained weight loss. No enlarged lymph nodes. No abdominal pain. No chest pain. No previous history of anemia. Note some increased fatigue and decreased exercise tolerance over the last few months. No focal bone pains. No new skin rashes currently. No headaches.. No history of bleeding. Other than his recent upper respiratory infection notes no history of recurrent infections.  Has been having chronic neck pains and recently had an MRI of his cervical spine 03/16/2016 by his primary care physician which showed multilevel cervical spondylosis causing spinal and foraminal stenosis no significant change from 08/09/2011.  Denies any tick bites or other insect bites. Was started on Breo for COPD in [place] but was subsequently discontinued. No other significant medication changes recently. Takes Tums and Centrum Silver over-the-counter no other significant supplements.  INTERVAL HISTORY  Mr Morocco is here for his scheduled f/u to discuss the results of his lab testing and Bone Marrow examination. We discussed that this showed findings consistent with myelodysplastic syndrome specifically RAEB-2 which would be considered a high risk MDS ( ISS-R score of 4.5). He notes dyspnea on exertion and mild fatigue. Has been taking  neutropenic precautions. No fevers or chills. No new bone pains. No bleeding.   MEDICAL HISTORY:  Past Medical History  Diagnosis Date  . Pulmonary nodule   . Multiple trauma      horse accident 2003 L SHOULDER  . DJD (degenerative joint disease)   . Diffuse esophageal spasm   .  Diverticulosis   . Peripheral neuropathy ([place])   . Spinal stenosis   . Oral herpes   . Systolic hypertension   . Cancer [facility])     Prostate, Melanoma- lt. Shoulder (no further problems since 2010  . COPD (chronic obstructive pulmonary disease) (HCC)     Dr. Annamaria Boots follows  . OSA (obstructive sleep apnea)     mild, rare cpap use  . Complication of anesthesia     versed gave the adverse reaction pt. ,became aggitated  Recurrent HSV related cold sores. Acoustic neuroma left 1999 - s/p surgery. Pruritus ani Injury in 2003 related to a horse accident resulting in hydrothorax, pneumothorax, fractured ribs, dislocated left shoulder, rotator cuff tear on the left, left elbow damage and left ulnar nerve damage.   SURGICAL HISTORY: Past Surgical History  Procedure Laterality Date  . Pilonidal cyst / sinus excision    . Acoustic neuroma left ear  1999  . Radical prostatectomy    . Multiple procedures for left arm      ELBOW,RIB FX PNEUMOTHORAX AFTER FALLING OFF MOUNTAIN  . Colonoscopy with propofol N/A 10/01/2013    Procedure: COLONOSCOPY WITH PROPOFOL;  Surgeon: Garlan Fair, MD;  Location: [facility];  Service: Endoscopy;  Laterality: N/A;  . Eye surgery      cosmetic surgery"brow lift" 4'15  . ,laceration to head in accident with hourse    . Esophagogastroduodenoscopy (egd) with propofol N/A 11/25/2014    Procedure: ESOPHAGOGASTRODUODENOSCOPY (EGD) WITH PROPOFOL;  Surgeon: Garlan Fair, MD;  Location: [facility];  Service: Endoscopy;  Laterality: N/A;  . Balloon dilation N/A 11/25/2014    Procedure: BALLOON DILATION;  Surgeon: Garlan Fair, MD;  Location: [facility];  Service: Endoscopy;  Laterality: N/A;    SOCIAL HISTORY: Social History   Social History  . Marital Status: Married    Spouse Name: N/A  . Number of Children: N/A  . Years of Education: N/A   Occupational History  . Not on file.   Social History Main Topics  . Smoking status: Former Smoker --  1.00 packs/day for 40 years    Quit date: 11/29/1995  . Smokeless tobacco: Never Used  . Alcohol Use: Yes     Comment: 1 drink per day  . Drug Use: No  . Sexual Activity: Not on file   Other Topics Concern  . Not on file   Social History Narrative   CPAP 8 advanced   Patient drinks 5 cups of caffeine daily   Exercises 3-4 times weekly   Son is a pulmonologist    FAMILY HISTORY: Family History  Problem Relation Age of Onset  . Emphysema Father   . Asthma Father   . Lung cancer Father   . Breast cancer Sister   . Breast cancer Sister   . Skin cancer Brother   . Prostate cancer Brother    Brother with a recent diagnosis of hemachromatosis Brother with CLL/SLL Mother had anemia, thalassemia. Hepatitis C with liver cancer  ALLERGIES:  is allergic to midazolam.  MEDICATIONS:  Current Outpatient Prescriptions  Medication Sig Dispense Refill  . aspirin 81 MG tablet Take 81 mg by  mouth at bedtime.     . calcium carbonate (TUMS - DOSED IN MG ELEMENTAL CALCIUM) 500 MG chewable tablet Chew 1-2 tablets by mouth daily as needed for indigestion or heartburn.    . nitroGLYCERIN (NITROSTAT) 0.4 MG SL tablet Place 1 tablet (0.4 mg total) under the tongue every 5 (five) minutes as needed for chest pain. 25 tablet 1  . omeprazole (PRILOSEC) 40 MG capsule Take 40 mg by mouth daily before lunch. 1/2 hour before.    . Probiotic Product (PROBIOTIC PO) Take 1 tablet by mouth at bedtime. Reported on 03/09/2016    . simvastatin (ZOCOR) 10 MG tablet Take 1 tablet (10 mg total) by mouth at bedtime. 90 tablet 3  . valACYclovir (VALTREX) 500 MG tablet Take 250 mg by mouth every other day. At night to suppress herpes gingivitis    . valsartan (DIOVAN) 80 MG tablet Take 80 mg by mouth at bedtime.     No current facility-administered medications for this visit.    REVIEW OF SYSTEMS:    10 Point review of Systems was done is negative except as noted above.  PHYSICAL EXAMINATION: ECOG PERFORMANCE  STATUS: 1 - Symptomatic but completely ambulatory  . Filed Vitals:   03/24/16 1200  BP: 139/60  Pulse: 75  Temp: 98.1 F (36.7 C)  Resp: 18   Filed Weights   03/24/16 1200  Weight: 170 lb 6.4 oz (77.293 kg)   .Body mass index is 26.68 kg/(m^2).  GENERAL:alert, in no acute distress and comfortable SKIN: skin color, texture, turgor are normal, no rashes or significant lesions EYES: normal, conjunctiva are pink and non-injected, sclera clear OROPHARYNX:no exudate, no erythema and lips, buccal mucosa, and tongue normal  NECK: supple, no JVD, thyroid normal size, non-tender, without nodularity LYMPH:  no palpable lymphadenopathy in the cervical, axillary or inguinal LUNGS: clear to auscultation With somewhat decreased entry bilaterally, no wheezing HEART: regular rate & rhythm,  no murmurs and no lower extremity edema ABDOMEN: abdomen soft, non-tender, normoactive bowel sounds , no palpable hepatosplenomegaly Musculoskeletal: no cyanosis of digits. PSYCH: alert & oriented x 3 with fluent speech NEURO: no focal motor deficits.  LABORATORY DATA:  I have reviewed the data as listed  . CBC Latest Ref Rng 03/24/2016 03/21/2016 03/18/2016  WBC 4.0 - 10.3 10e3/uL 1.5(L) 1.6(L) 1.6(L)  Hemoglobin 13.0 - 17.1 g/dL 8.4(L) 8.4(L) 8.6(L)  Hematocrit 38.4 - 49.9 % 28.2(L) 27.6(L) 29.0(L)  Platelets 140 - 400 10e3/uL 220 231 215   .[name] Lab Results  Component Value Date   RETICCTPCT 3.61* 03/24/2016   RBC 2.85* 03/24/2016   RETICCTABS 102.89* 03/24/2016    CBC    Component Value Date/Time   WBC 1.5* 03/24/2016 1414   WBC 1.6* 03/21/2016 0720   RBC 2.85* 03/24/2016 1414   RBC 2.79* 03/21/2016 0720   HGB 8.4* 03/24/2016 1414   HGB 8.4* 03/21/2016 0720   HCT 28.2* 03/24/2016 1414   HCT 27.6* 03/21/2016 0720   HCT 27.2* 03/18/2016 1503   PLT 220 03/24/2016 1414   PLT 231 03/21/2016 0720   MCV 98.9* 03/24/2016 1414   MCV 98.9 03/21/2016 0720   MCH 29.5 03/24/2016 1414   MCH 30.1  03/21/2016 0720   MCHC 29.8* 03/24/2016 1414   MCHC 30.4 03/21/2016 0720   RDW 16.9* 03/24/2016 1414   RDW 17.0* 03/21/2016 0720   LYMPHSABS 1.0 03/24/2016 1414   LYMPHSABS 1.2 03/21/2016 0720   MONOABS 0.1 03/24/2016 1414   MONOABS 0.0* 03/21/2016 0720   EOSABS  0.0 03/24/2016 1414   EOSABS 0.0 03/21/2016 0720   BASOSABS 0.0 03/24/2016 1414   BASOSABS 0.0 03/21/2016 0720   . CMP Latest Ref Rng 03/24/2016 03/18/2016 11/13/2013  Glucose 70 - 140 mg/dl - 94 -  BUN 7.0 - 26.0 mg/dL - 18.1 -  Creatinine 0.7 - 1.3 mg/dL - 1.0 -  Sodium 136 - 145 mEq/L - 142 -  Potassium 3.5 - 5.1 mEq/L - 3.9 -  Chloride 96 - 112 mEq/L - - -  CO2 22 - 29 mEq/L - 27 -  Calcium 8.4 - 10.4 mg/dL - 8.9 -  Total Protein 6.0 - 8.5 g/dL 5.9(L) 6.3(L) -  Total Bilirubin 0.20 - 1.20 mg/dL - 1.60(H) -  Alkaline Phos 40 - 150 U/L - 46 -  AST 5 - 34 U/L - 22 -  ALT 0 - 55 U/L - 13 18   . Lab Results  Component Value Date   LDH 208 03/18/2016   Component     Latest Ref Rng 03/18/2016  HCV Ab     0.0 - 0.9 s/co ratio <0.1  Comment      Comment  Vitamin B12     211 - 946 pg/mL 391  EBV VCA IgM     0.0 - 35.9 U/mL <36.0  EBV VCA IgG     0.0 - 17.9 U/mL >600.0 (H)  RA Latex Turbid.     0.0 - 13.9 IU/mL <10.0  HIV     Non Reactive Non Reactive   Component     Latest Ref Rng 03/24/2016  IgG (Immunoglobin G), Serum     700 - 1600 mg/dL 833  IgA/Immunoglobulin A, Serum     61 - 437 mg/dL 129  IgM, Qn, Serum     15 - 143 mg/dL 76  Total Protein     6.0 - 8.5 g/dL 5.9 (L)  Albumin SerPl Elph-Mcnc     2.9 - 4.4 g/dL 3.4  Alpha 1     0.0 - 0.4 g/dL 0.2  Alpha2 Glob SerPl Elph-Mcnc     0.4 - 1.0 g/dL 0.5  B-Globulin SerPl Elph-Mcnc     0.7 - 1.3 g/dL 0.9  Gamma Glob SerPl Elph-Mcnc     0.4 - 1.8 g/dL 0.9  M Protein SerPl Elph-Mcnc     Not Observed g/dL Not Observed  Globulin, Total     2.2 - 3.9 g/dL 2.5  Albumin/Glob SerPl     0.7 - 1.7 1.4  IFE 1      Comment  Please Note (HCV):       Comment  Ig Kappa Free Light Chain     3.30 - 19.40 mg/L 20.71 (H)  Ig Lambda Free Light Chain     5.71 - 26.30 mg/L 46.83 (H)  Kappa/Lambda FluidC Ratio     0.26 - 1.65 0.44  Sed Rate     0 - 30 mm/hr 2  CRP     0.0 - 4.9 mg/L 0.3          Peripheral blood smear (personally reviewed by me)  Dimorphic red cell population with significant anisopoikilocytosis, several large and dysmorphic platelets, pelgeroid neutrophils. Rare blasts. Some atypical lymphocytes.     RADIOGRAPHIC STUDIES: I have personally reviewed the radiological images as listed and agreed with the findings in the report. Dg Chest 2 View  03/09/2016  CLINICAL DATA:  Two months of shortness of breath with exertion, history of COPD, pulmonary nodules, former smoker. EXAM: CHEST  2 VIEW COMPARISON:  PA and lateral chest x-ray dated October 08, 2008 and chest CT scan of October 22, 2013. FINDINGS: The lungs are adequately inflated. There is no focal infiltrate. There is no pleural effusion. No pulmonary parenchymal masses are observed. The heart and pulmonary vascularity are normal. The mediastinum is normal in width. There is a fracture of the lateral aspect of the left eighth and ninth ribs without definite healing. These were not visible on the most recent chest exam which was a CT scan of February 28, 2013. IMPRESSION: There is no active cardiopulmonary disease. There are fractures of the lateral aspects of the left eighth and ninth ribs which appear to be acute or subacute. Electronically Signed   By: David  Martinique M.D.   On: 03/09/2016 16:26   Mr Cervical Spine Wo Contrast  03/16/2016  CLINICAL DATA:  Cervical disc degeneration. Neck pain with right-sided pain. EXAM: MRI CERVICAL SPINE WITHOUT CONTRAST TECHNIQUE: Multiplanar, multisequence MR imaging of the cervical spine was performed. No intravenous contrast was administered. COMPARISON:  Cervical MRI 08/09/2011 FINDINGS: Accentuated cervical lordosis. Normal  alignment. No fracture or mass. Several hemangiomata are noted in the vertebral bodies which appear benign. Spinal cord signal normal. No cord compression or cord lesion. Cervical medullary junction normal. C2-3: Mild foraminal narrowing due to facet hypertrophy bilaterally. C3-4: Mild disc and facet degeneration. Moderate foraminal narrowing bilaterally. C4-5: Disc degeneration and spurring. Bilateral facet hypertrophy. Mild spinal stenosis and moderate foraminal narrowing bilaterally. No change from the prior study C5-6: Mild degenerative change without significant spinal stenosis. Moderate foraminal narrowing bilaterally C6-7: Moderate disc degeneration and disc space narrowing. Diffuse uncinate spurring causing moderate to severe foraminal encroachment bilaterally. C7-T1: Bilateral facet hypertrophy causing moderate to severe foraminal narrowing bilaterally. Mild spinal stenosis. IMPRESSION: Multilevel cervical spondylosis causing spinal and foraminal stenosis as described above. No significant change from 08/09/2011. Electronically Signed   By: Franchot Gallo M.D.   On: 03/16/2016 16:02   Ct Biopsy  03/21/2016  INDICATION: Pancytopenia, concern for myelodysplastic syndrome EXAM: CT GUIDED RIGHT ILIAC BONE MARROW ASPIRATION AND CORE BIOPSY Date:  4/24/20174/24/2017 9:56 am Radiologist:  M. Daryll Brod, MD Guidance:  CT FLUOROSCOPY TIME:  Fluoroscopy Time: None. MEDICATIONS: 1% lidocaine locally ANESTHESIA/SEDATION: 75 mcg IV Fentanyl Moderate Sedation Time:  10 minutes The patient was continuously monitored during the procedure by the interventional radiology nurse under my direct supervision. CONTRAST:  None. COMPLICATIONS: None PROCEDURE: Informed consent was obtained from the patient following explanation of the procedure, risks, benefits and alternatives. The patient understands, agrees and consents for the procedure. All questions were addressed. A time out was performed. The patient was positioned  prone and non-contrast localization CT was performed of the pelvis to demonstrate the iliac marrow spaces. Maximal barrier sterile technique utilized including caps, mask, sterile gowns, sterile gloves, large sterile drape, hand hygiene, and Betadine prep. Under sterile conditions and local anesthesia, an 11 gauge coaxial bone biopsy needle was advanced into the right iliac marrow space. Needle position was confirmed with CT imaging. Initially, bone marrow aspiration was performed. Next, the 11 gauge outer cannula was utilized to obtain a right iliac bone marrow core biopsy. Needle was removed. Hemostasis was obtained with compression. The patient tolerated the procedure well. Samples were prepared with the cytotechnologist. No immediate complications. IMPRESSION: CT guided right iliac bone marrow aspiration and core biopsy. Electronically Signed   By: Jerilynn Mages.  Shick M.D.   On: 03/21/2016 10:18    ASSESSMENT & PLAN:   75 year old  Caucasian male with  1) myelodysplastic syndrome (RAEB-2 with about 14-15% blasts) ISS-R score of 4.5. Presented with Subacute Anemia (with high normal MCV) + leukopenia/Neutropenia - new since September 2016. Patient has normal platelet counts at this time. Other workup as noted above Plan -Cytogenetics are still pending at this time. -We discussed all the test results, bone marrow findings in details. We discussed the diagnosis, natural history and prognosis of his MDS/RAEB-2 including concerns for progression to frank AML. -He is agreeable to getting a referral to [facility] for determining if he would be a candidate for Allogeneic HSCT and whether they would consider a induction followed by [facility] AlloHSCT approach. A referral has already been sent to [facility]. -Alternatively we will offer him treatment with Vidaza. -he will also need supportive cares with PRBC transfusions as needed for symptomatic anemia or hemoglobin less than 8. -Cannot use G-CSF for neutropenia due to concern for  increased myeloid stimulation and risk for frank progression to AML. -We discussed that without transplantation the median overall survival is off the order of about a year and a half with median time to 25% AML evaluation about 1.4 years. -We'll continue to monitor labs every 1-2 weeks . -We will have him undergo chemotherapy counseling for Vidaza in place orders with the plan to confirm them.  The patient and his wife confirmed understanding of this discussion and plan. They are agreeable for me share patient's medical information with their son in [place] who is a physician. . Orders Placed This Encounter  Procedures  . CBC & Diff and Retic    Standing Status: Future     Number of Occurrences: 1     Standing Expiration Date: 04/28/2017  . CBC & Diff and Retic    Standing Status: Standing     Number of Occurrences: 50     Standing Expiration Date: 03/24/2017  . Type and screen    Standing Status: Future     Number of Occurrences:      Standing Expiration Date: 03/24/2017     All of the patients questions were answered with apparent satisfaction. The patient knows to call the clinic with any problems, questions or concerns.  I spent 40 minutes counseling the patient face to face. The total time spent in the appointment was 40 minutes and more than 50% was on counseling and direct patient cares.    Sullivan Lone MD [place] AAHIVMS [facility] Hematology/Oncology Physician [facility]  (Office):       [phone] (Work cell):  [phone] (Fax):           [phone]

## 2016-03-30 ENCOUNTER — Other Ambulatory Visit: Payer: Self-pay

## 2016-03-30 MED ORDER — SIMVASTATIN 10 MG PO TABS: 10.0000 mg | ORAL_TABLET | Freq: Every day | ORAL | Status: DC

## 2016-03-31 ENCOUNTER — Other Ambulatory Visit: Payer: Medicare Other

## 2016-03-31 ENCOUNTER — Other Ambulatory Visit: Payer: Self-pay | Admitting: Hematology

## 2016-03-31 ENCOUNTER — Other Ambulatory Visit: Payer: Self-pay | Admitting: *Deleted

## 2016-03-31 DIAGNOSIS — D469 Myelodysplastic syndrome, unspecified: Secondary | ICD-10-CM | POA: Diagnosis not present

## 2016-03-31 DIAGNOSIS — D4622 Refractory anemia with excess of blasts 2: Secondary | ICD-10-CM

## 2016-04-01 ENCOUNTER — Telehealth: Payer: Self-pay | Admitting: *Deleted

## 2016-04-01 ENCOUNTER — Other Ambulatory Visit (HOSPITAL_BASED_OUTPATIENT_CLINIC_OR_DEPARTMENT_OTHER): Payer: Medicare Other

## 2016-04-01 ENCOUNTER — Ambulatory Visit (HOSPITAL_BASED_OUTPATIENT_CLINIC_OR_DEPARTMENT_OTHER): Payer: Medicare Other

## 2016-04-01 ENCOUNTER — Other Ambulatory Visit: Payer: Self-pay | Admitting: *Deleted

## 2016-04-01 ENCOUNTER — Telehealth: Payer: Self-pay | Admitting: Hematology

## 2016-04-01 ENCOUNTER — Ambulatory Visit (HOSPITAL_COMMUNITY)
Admission: RE | Admit: 2016-04-01 | Discharge: 2016-04-01 | Disposition: A | Discharge status: Home / Self Care | Payer: Medicare Other | Source: Ambulatory Visit | Attending: Hematology | Admitting: Hematology

## 2016-04-01 ENCOUNTER — Other Ambulatory Visit: Payer: Medicare Other

## 2016-04-01 ENCOUNTER — Encounter: Payer: Self-pay | Admitting: General Practice

## 2016-04-01 VITALS — BP 107/67 | HR 60 | Temp 98.2°F | Resp 16

## 2016-04-01 DIAGNOSIS — D469 Myelodysplastic syndrome, unspecified: Secondary | ICD-10-CM

## 2016-04-01 DIAGNOSIS — D4622 Refractory anemia with excess of blasts 2: Secondary | ICD-10-CM

## 2016-04-01 LAB — CBC & DIFF AND RETIC
BASO%: 0 % (ref 0.0–2.0)
Basophils Absolute: 0 10*3/uL (ref 0.0–0.1)
EOS ABS: 0 10*3/uL (ref 0.0–0.5)
EOS%: 0.7 % (ref 0.0–7.0)
HCT: 27.5 % — ABNORMAL LOW (ref 38.4–49.9)
HEMOGLOBIN: 7.9 g/dL — AB (ref 13.0–17.1)
Immature Retic Fract: 18.8 % — ABNORMAL HIGH (ref 3.00–10.60)
LYMPH%: 73.8 % — ABNORMAL HIGH (ref 14.0–49.0)
MCH: 28.5 pg (ref 27.2–33.4)
MCHC: 28.7 g/dL — ABNORMAL LOW (ref 32.0–36.0)
MCV: 99.3 fL — ABNORMAL HIGH (ref 79.3–98.0)
MONO#: 0 10*3/uL — ABNORMAL LOW (ref 0.1–0.9)
MONO%: 2.1 % (ref 0.0–14.0)
NEUT%: 23.4 % — ABNORMAL LOW (ref 39.0–75.0)
NEUTROS ABS: 0.3 10*3/uL — AB (ref 1.5–6.5)
Platelets: 208 10*3/uL (ref 140–400)
RBC: 2.77 10*6/uL — AB (ref 4.20–5.82)
RDW: 17.2 % — ABNORMAL HIGH (ref 11.0–14.6)
RETIC %: 4.33 % — AB (ref 0.80–1.80)
Retic Ct Abs: 119.94 10*3/uL — ABNORMAL HIGH (ref 34.80–93.90)
WBC: 1.5 10*3/uL — ABNORMAL LOW (ref 4.0–10.3)
lymph#: 1.1 10*3/uL (ref 0.9–3.3)

## 2016-04-01 LAB — COMPREHENSIVE METABOLIC PANEL
ALBUMIN: 3.4 g/dL — AB (ref 3.5–5.0)
ALK PHOS: 46 U/L (ref 40–150)
ALT: 17 U/L (ref 0–55)
AST: 20 U/L (ref 5–34)
Anion Gap: 5 mEq/L (ref 3–11)
BUN: 22.6 mg/dL (ref 7.0–26.0)
CHLORIDE: 111 meq/L — AB (ref 98–109)
CO2: 26 meq/L (ref 22–29)
Calcium: 8.3 mg/dL — ABNORMAL LOW (ref 8.4–10.4)
Creatinine: 1 mg/dL (ref 0.7–1.3)
EGFR: 70 mL/min/{1.73_m2} — ABNORMAL LOW (ref >90)
GLUCOSE: 100 mg/dL (ref 70–140)
POTASSIUM: 4.2 meq/L (ref 3.5–5.1)
SODIUM: 143 meq/L (ref 136–145)
Total Bilirubin: 1.26 mg/dL — ABNORMAL HIGH (ref 0.20–1.20)
Total Protein: 5.9 g/dL — ABNORMAL LOW (ref 6.4–8.3)

## 2016-04-01 LAB — ABO/RH: ABO/RH(D): O NEG

## 2016-04-01 LAB — PREPARE RBC (CROSSMATCH)

## 2016-04-01 MED ORDER — ACETAMINOPHEN 325 MG PO TABS
ORAL_TABLET | ORAL | Status: AC
  Filled 2016-04-01: qty 2

## 2016-04-01 MED ORDER — ACETAMINOPHEN 325 MG PO TABS
650.0000 mg | ORAL_TABLET | Freq: Once | ORAL | Status: AC
  Administered 2016-04-01: 650 mg via ORAL

## 2016-04-01 MED ORDER — DIPHENHYDRAMINE HCL 25 MG PO CAPS
25.0000 mg | ORAL_CAPSULE | Freq: Once | ORAL | Status: AC
  Administered 2016-04-01: 25 mg via ORAL

## 2016-04-01 MED ORDER — SODIUM CHLORIDE 0.9 % IV SOLN
250.0000 mL | Freq: Once | INTRAVENOUS | Status: AC
  Administered 2016-04-01: 250 mL via INTRAVENOUS

## 2016-04-01 MED ORDER — DIPHENHYDRAMINE HCL 25 MG PO CAPS
ORAL_CAPSULE | ORAL | Status: AC
  Filled 2016-04-01: qty 1

## 2016-04-01 NOTE — Telephone Encounter (Signed)
Per staff message and POF I have scheduled appts. Advised scheduler of appts. JMW  

## 2016-04-01 NOTE — Patient Instructions (Signed)

## 2016-04-01 NOTE — Telephone Encounter (Signed)
per of to sch pt appt-pt tog et updated copy b4 leaving** °

## 2016-04-01 NOTE — Progress Notes (Signed)
Spiritual Care Note  Met Warren [name] and wife Warren [name] in chemo class this morning. Members of [employer], they were very pleased to meet a Stage manager and to learn of spiritual support available; this helped them feel safe and welcome.  They shared and processed some of their dx story.  They report good support from congregation and friends, and plan to reach out as need/desire arises.  Please also page if circumstances change.  Thank you.  [place], [place], [facility] Pager [phone] Voicemail [phone]

## 2016-04-04 ENCOUNTER — Ambulatory Visit (HOSPITAL_BASED_OUTPATIENT_CLINIC_OR_DEPARTMENT_OTHER): Payer: Medicare Other

## 2016-04-04 ENCOUNTER — Other Ambulatory Visit (HOSPITAL_BASED_OUTPATIENT_CLINIC_OR_DEPARTMENT_OTHER): Payer: Medicare Other

## 2016-04-04 ENCOUNTER — Encounter: Payer: Self-pay | Admitting: Hematology

## 2016-04-04 ENCOUNTER — Telehealth: Payer: Self-pay | Admitting: Hematology

## 2016-04-04 ENCOUNTER — Ambulatory Visit (HOSPITAL_BASED_OUTPATIENT_CLINIC_OR_DEPARTMENT_OTHER): Payer: Medicare Other | Admitting: Hematology

## 2016-04-04 VITALS — BP 139/59 | HR 72 | Temp 98.2°F | Resp 18 | Ht 67.0 in | Wt 168.6 lb

## 2016-04-04 DIAGNOSIS — D649 Anemia, unspecified: Secondary | ICD-10-CM | POA: Diagnosis not present

## 2016-04-04 DIAGNOSIS — D4622 Refractory anemia with excess of blasts 2: Secondary | ICD-10-CM

## 2016-04-04 DIAGNOSIS — D709 Neutropenia, unspecified: Secondary | ICD-10-CM | POA: Diagnosis not present

## 2016-04-04 DIAGNOSIS — D469 Myelodysplastic syndrome, unspecified: Secondary | ICD-10-CM

## 2016-04-04 DIAGNOSIS — Z5111 Encounter for antineoplastic chemotherapy: Secondary | ICD-10-CM | POA: Diagnosis present

## 2016-04-04 LAB — COMPREHENSIVE METABOLIC PANEL WITH GFR
ALT: 16 U/L (ref 0–55)
AST: 19 U/L (ref 5–34)
Albumin: 3.5 g/dL (ref 3.5–5.0)
Alkaline Phosphatase: 44 U/L (ref 40–150)
Anion Gap: 6 meq/L (ref 3–11)
BUN: 18.3 mg/dL (ref 7.0–26.0)
CO2: 26 meq/L (ref 22–29)
Calcium: 8.6 mg/dL (ref 8.4–10.4)
Chloride: 109 meq/L (ref 98–109)
Creatinine: 1 mg/dL (ref 0.7–1.3)
EGFR: 71 mL/min/{1.73_m2} — ABNORMAL LOW
Glucose: 137 mg/dL (ref 70–140)
Potassium: 3.8 meq/L (ref 3.5–5.1)
Sodium: 141 meq/L (ref 136–145)
Total Bilirubin: 1.98 mg/dL — ABNORMAL HIGH (ref 0.20–1.20)
Total Protein: 5.9 g/dL — ABNORMAL LOW (ref 6.4–8.3)

## 2016-04-04 LAB — CBC & DIFF AND RETIC
BASO%: 0 % (ref 0.0–2.0)
BASOS ABS: 0 10*3/uL (ref 0.0–0.1)
EOS%: 0.7 % (ref 0.0–7.0)
Eosinophils Absolute: 0 10*3/uL (ref 0.0–0.5)
HEMATOCRIT: 29.9 % — AB (ref 38.4–49.9)
HEMOGLOBIN: 9 g/dL — AB (ref 13.0–17.1)
Immature Retic Fract: 21.3 % — ABNORMAL HIGH (ref 3.00–10.60)
LYMPH%: 74.7 % — AB (ref 14.0–49.0)
MCH: 29.3 pg (ref 27.2–33.4)
MCHC: 30.1 g/dL — AB (ref 32.0–36.0)
MCV: 97.4 fL (ref 79.3–98.0)
MONO#: 0 10*3/uL — ABNORMAL LOW (ref 0.1–0.9)
MONO%: 1.3 % (ref 0.0–14.0)
NEUT#: 0.4 10*3/uL — CL (ref 1.5–6.5)
NEUT%: 23.3 % — AB (ref 39.0–75.0)
PLATELETS: 214 10*3/uL (ref 140–400)
RBC: 3.07 10*6/uL — ABNORMAL LOW (ref 4.20–5.82)
RDW: 17.3 % — ABNORMAL HIGH (ref 11.0–14.6)
RETIC %: 3.67 % — AB (ref 0.80–1.80)
Retic Ct Abs: 112.67 10*3/uL — ABNORMAL HIGH (ref 34.80–93.90)
WBC: 1.5 10*3/uL — AB (ref 4.0–10.3)
lymph#: 1.1 10*3/uL (ref 0.9–3.3)
nRBC: 0 % (ref 0–0)

## 2016-04-04 LAB — TYPE AND SCREEN
ABO/RH(D): O NEG
ANTIBODY SCREEN: NEGATIVE
UNIT DIVISION: 0

## 2016-04-04 LAB — CHROMOSOME ANALYSIS, BONE MARROW

## 2016-04-04 LAB — TISSUE HYBRIDIZATION (BONE MARROW)-NCBH

## 2016-04-04 MED ORDER — ONDANSETRON HCL 8 MG PO TABS
8.0000 mg | ORAL_TABLET | Freq: Once | ORAL | Status: AC
  Administered 2016-04-04: 8 mg via ORAL

## 2016-04-04 MED ORDER — ONDANSETRON HCL 8 MG PO TABS
ORAL_TABLET | ORAL | Status: AC
  Filled 2016-04-04: qty 1

## 2016-04-04 MED ORDER — AZACITIDINE CHEMO SQ INJECTION
76.0000 mg/m2 | Freq: Once | INTRAMUSCULAR | Status: AC
  Administered 2016-04-04: 145 mg via SUBCUTANEOUS
  Filled 2016-04-04: qty 5.8

## 2016-04-04 NOTE — Progress Notes (Signed)
Ok to treat with decreased neutrophils per Dr. Irene Limbo.  Hassan Rowan, RN notified

## 2016-04-04 NOTE — Progress Notes (Signed)
[name]    HEMATOLOGY/ONCOLOGY CONSULTATION NOTE  Date of Service: 04/04/2016    Patient Care Team: Wenda Low, MD as PCP - General (Internal Medicine)  CHIEF COMPLAINTS: Follow-up for MDS  DIAGNOSIS RAEB-2 with about 15% blasts. Trisomy 8 (Intermediate risk) on FISH studies. R-ISS ( score 7- very high risk)  INTERVAL HISTORY  Warren [name] is here for his scheduled f/u prior to starting his Azacytidine for very high-risk MDS with RAEB 2. He has undergone treatment counseling by the chemotherapy counseling nurse and I.   He notes that he is feeling much better After his PRBC transfusion on Friday. No other acute new symptoms. No evidence of full would bleeding. No fevers or chills.[name]  MEDICAL HISTORY:  Past Medical History  Diagnosis Date  . Pulmonary nodule   . Multiple trauma      horse accident 2003 L SHOULDER  . DJD (degenerative joint disease)   . Diffuse esophageal spasm   . Diverticulosis   . Peripheral neuropathy ([place])   . Spinal stenosis   . Oral herpes   . Systolic hypertension   . Cancer [facility])     Prostate, Melanoma- lt. Shoulder (no further problems since 2010  . COPD (chronic obstructive pulmonary disease) (HCC)     Dr. Annamaria Boots follows  . OSA (obstructive sleep apnea)     mild, rare cpap use  . Complication of anesthesia     versed gave the adverse reaction pt. ,became aggitated  Recurrent HSV related cold sores. Acoustic neuroma left 1999 - s/p surgery. Pruritus ani Injury in 2003 related to a horse accident resulting in hydrothorax, pneumothorax, fractured ribs, dislocated left shoulder, rotator cuff tear on the left, left elbow damage and left ulnar nerve damage.   SURGICAL HISTORY: Past Surgical History  Procedure Laterality Date  . Pilonidal cyst / sinus excision    . Acoustic neuroma left ear  1999  . Radical prostatectomy    . Multiple procedures for left arm      ELBOW,RIB FX PNEUMOTHORAX AFTER FALLING OFF MOUNTAIN  . Colonoscopy with propofol N/A  10/01/2013    Procedure: COLONOSCOPY WITH PROPOFOL;  Surgeon: Garlan Fair, MD;  Location: [facility];  Service: Endoscopy;  Laterality: N/A;  . Eye surgery      cosmetic surgery"brow lift" 4'15  . ,laceration to head in accident with hourse    . Esophagogastroduodenoscopy (egd) with propofol N/A 11/25/2014    Procedure: ESOPHAGOGASTRODUODENOSCOPY (EGD) WITH PROPOFOL;  Surgeon: Garlan Fair, MD;  Location: [facility];  Service: Endoscopy;  Laterality: N/A;  . Balloon dilation N/A 11/25/2014    Procedure: BALLOON DILATION;  Surgeon: Garlan Fair, MD;  Location: [facility];  Service: Endoscopy;  Laterality: N/A;    SOCIAL HISTORY: Social History   Social History  . Marital Status: Married    Spouse Name: N/A  . Number of Children: N/A  . Years of Education: N/A   Occupational History  . Not on file.   Social History Main Topics  . Smoking status: Former Smoker -- 1.00 packs/day for 40 years    Quit date: 11/29/1995  . Smokeless tobacco: Never Used  . Alcohol Use: Yes     Comment: 1 drink per day  . Drug Use: No  . Sexual Activity: Not on file   Other Topics Concern  . Not on file   Social History Narrative   CPAP 8 advanced   Patient drinks 5 cups of caffeine daily   Exercises 3-4 times weekly  Son is a pulmonologist    FAMILY HISTORY: Family History  Problem Relation Age of Onset  . Emphysema Father   . Asthma Father   . Lung cancer Father   . Breast cancer Sister   . Breast cancer Sister   . Skin cancer Brother   . Prostate cancer Brother    Brother with a recent diagnosis of hemachromatosis Brother with CLL/SLL Mother had anemia, thalassemia. Hepatitis C with liver cancer  ALLERGIES:  is allergic to midazolam.  MEDICATIONS:  Current Outpatient Prescriptions  Medication Sig Dispense Refill  . aspirin 81 MG tablet Take 81 mg by mouth at bedtime.     . calcium carbonate (TUMS - DOSED IN MG ELEMENTAL CALCIUM) 500 MG chewable tablet Chew  1-2 tablets by mouth daily as needed for indigestion or heartburn.    . nitroGLYCERIN (NITROSTAT) 0.4 MG SL tablet Place 1 tablet (0.4 mg total) under the tongue every 5 (five) minutes as needed for chest pain. 25 tablet 1  . omeprazole (PRILOSEC) 40 MG capsule Take 40 mg by mouth daily before lunch. 1/2 hour before.    . Probiotic Product (PROBIOTIC PO) Take 1 tablet by mouth at bedtime. Reported on 03/09/2016    . simvastatin (ZOCOR) 10 MG tablet Take 1 tablet (10 mg total) by mouth at bedtime. 90 tablet 2  . valACYclovir (VALTREX) 500 MG tablet Take 250 mg by mouth every other day. At night to suppress herpes gingivitis    . valsartan (DIOVAN) 80 MG tablet Take 80 mg by mouth at bedtime.     No current facility-administered medications for this visit.    REVIEW OF SYSTEMS:    10 Point review of Systems was done is negative except as noted above.  PHYSICAL EXAMINATION: ECOG PERFORMANCE STATUS: 1 - Symptomatic but completely ambulatory  . Filed Vitals:   04/04/16 0900  BP: 139/59  Pulse: 72  Temp: 98.2 F (36.8 C)  Resp: 18   Filed Weights   04/04/16 0900  Weight: 168 lb 9.6 oz (76.476 kg)   .Body mass index is 26.4 kg/(m^2).  GENERAL:alert, in no acute distress and comfortable SKIN: skin color, texture, turgor are normal, no rashes or significant lesions EYES: normal, conjunctiva are pink and non-injected, sclera clear OROPHARYNX:no exudate, no erythema and lips, buccal mucosa, and tongue normal  NECK: supple, no JVD, thyroid normal size, non-tender, without nodularity LYMPH:  no palpable lymphadenopathy in the cervical, axillary or inguinal LUNGS: clear to auscultation With somewhat decreased entry bilaterally, no wheezing HEART: regular rate & rhythm,  no murmurs and no lower extremity edema ABDOMEN: abdomen soft, non-tender, normoactive bowel sounds , no palpable hepatosplenomegaly Musculoskeletal: no cyanosis of digits. PSYCH: alert & oriented x 3 with fluent  speech NEURO: no focal motor deficits.  LABORATORY DATA:  I have reviewed the data as listed  . CBC Latest Ref Rng 04/04/2016 04/01/2016 03/24/2016  WBC 4.0 - 10.3 10e3/uL 1.5(L) 1.5(L) 1.5(L)  Hemoglobin 13.0 - 17.1 g/dL 9.0(L) 7.9(L) 8.4(L)  Hematocrit 38.4 - 49.9 % 29.9(L) 27.5(L) 28.2(L)  Platelets 140 - 400 10e3/uL 214 208 220   .[name] Lab Results  Component Value Date   RETICCTPCT 3.67* 04/04/2016   RBC 3.07* 04/04/2016   RETICCTABS 112.67* 04/04/2016    CBC    Component Value Date/Time   WBC 1.5* 04/04/2016 0848   WBC 1.6* 03/21/2016 0720   RBC 3.07* 04/04/2016 0848   RBC 2.79* 03/21/2016 0720   HGB 9.0* 04/04/2016 0848   HGB 8.4* 03/21/2016  0720   HCT 29.9* 04/04/2016 0848   HCT 27.6* 03/21/2016 0720   HCT 27.2* 03/18/2016 1503   PLT 214 04/04/2016 0848   PLT 231 03/21/2016 0720   MCV 97.4 04/04/2016 0848   MCV 98.9 03/21/2016 0720   MCH 29.3 04/04/2016 0848   MCH 30.1 03/21/2016 0720   MCHC 30.1* 04/04/2016 0848   MCHC 30.4 03/21/2016 0720   RDW 17.3* 04/04/2016 0848   RDW 17.0* 03/21/2016 0720   LYMPHSABS 1.1 04/04/2016 0848   LYMPHSABS 1.2 03/21/2016 0720   MONOABS 0.0* 04/04/2016 0848   MONOABS 0.0* 03/21/2016 0720   EOSABS 0.0 04/04/2016 0848   EOSABS 0.0 03/21/2016 0720   BASOSABS 0.0 04/04/2016 0848   BASOSABS 0.0 03/21/2016 0720   . CMP Latest Ref Rng 04/04/2016 04/01/2016 03/24/2016  Glucose 70 - 140 mg/dl 137 100 -  BUN 7.0 - 26.0 mg/dL 18.3 22.6 -  Creatinine 0.7 - 1.3 mg/dL 1.0 1.0 -  Sodium 136 - 145 mEq/L 141 143 -  Potassium 3.5 - 5.1 mEq/L 3.8 4.2 -  Chloride 96 - 112 mEq/L - - -  CO2 22 - 29 mEq/L 26 26 -  Calcium 8.4 - 10.4 mg/dL 8.6 8.3(L) -  Total Protein 6.4 - 8.3 g/dL 5.9(L) 5.9(L) 5.9(L)  Total Bilirubin 0.20 - 1.20 mg/dL 1.98(H) 1.26(H) -  Alkaline Phos 40 - 150 U/L 44 46 -  AST 5 - 34 U/L 19 20 -  ALT 0 - 55 U/L 16 17 -   . Lab Results  Component Value Date   LDH 208 03/18/2016   Component     Latest Ref Rng 03/18/2016  HCV  Ab     0.0 - 0.9 s/co ratio <0.1  Comment      Comment  Vitamin B12     211 - 946 pg/mL 391  EBV VCA IgM     0.0 - 35.9 U/mL <36.0  EBV VCA IgG     0.0 - 17.9 U/mL >600.0 (H)  RA Latex Turbid.     0.0 - 13.9 IU/mL <10.0  HIV     Non Reactive Non Reactive   Component     Latest Ref Rng 03/24/2016  IgG (Immunoglobin G), Serum     700 - 1600 mg/dL 833  IgA/Immunoglobulin A, Serum     61 - 437 mg/dL 129  IgM, Qn, Serum     15 - 143 mg/dL 76  Total Protein     6.0 - 8.5 g/dL 5.9 (L)  Albumin SerPl Elph-Mcnc     2.9 - 4.4 g/dL 3.4  Alpha 1     0.0 - 0.4 g/dL 0.2  Alpha2 Glob SerPl Elph-Mcnc     0.4 - 1.0 g/dL 0.5  B-Globulin SerPl Elph-Mcnc     0.7 - 1.3 g/dL 0.9  Gamma Glob SerPl Elph-Mcnc     0.4 - 1.8 g/dL 0.9  M Protein SerPl Elph-Mcnc     Not Observed g/dL Not Observed  Globulin, Total     2.2 - 3.9 g/dL 2.5  Albumin/Glob SerPl     0.7 - 1.7 1.4  IFE 1      Comment  Please Note (HCV):      Comment  Ig Kappa Free Light Chain     3.30 - 19.40 mg/L 20.71 (H)  Ig Lambda Free Light Chain     5.71 - 26.30 mg/L 46.83 (H)  Kappa/Lambda FluidC Ratio     0.26 - 1.65 0.44  Sed Rate  0 - 30 mm/hr 2  CRP     0.0 - 4.9 mg/L 0.3          Peripheral blood smear (personally reviewed by me)  Dimorphic red cell population with significant anisopoikilocytosis, several large and dysmorphic platelets, pelgeroid neutrophils. Rare blasts. Some atypical lymphocytes.  RADIOGRAPHIC STUDIES: I have personally reviewed the radiological images as listed and agreed with the findings in the report. Dg Chest 2 View  03/09/2016  CLINICAL DATA:  Two months of shortness of breath with exertion, history of COPD, pulmonary nodules, former smoker. EXAM: CHEST  2 VIEW COMPARISON:  PA and lateral chest x-ray dated October 08, 2008 and chest CT scan of October 22, 2013. FINDINGS: The lungs are adequately inflated. There is no focal infiltrate. There is no pleural effusion. No  pulmonary parenchymal masses are observed. The heart and pulmonary vascularity are normal. The mediastinum is normal in width. There is a fracture of the lateral aspect of the left eighth and ninth ribs without definite healing. These were not visible on the most recent chest exam which was a CT scan of February 28, 2013. IMPRESSION: There is no active cardiopulmonary disease. There are fractures of the lateral aspects of the left eighth and ninth ribs which appear to be acute or subacute. Electronically Signed   By: David  [place] M.D.   On: 03/09/2016 16:26   Warren [name]  03/16/2016  CLINICAL DATA:  Cervical disc degeneration. Neck pain with right-sided pain. EXAM: MRI CERVICAL SPINE WITHOUT [name] TECHNIQUE: Multiplanar, multisequence Warren imaging of the cervical spine was performed. No intravenous [name] was administered. COMPARISON:  Cervical MRI 08/09/2011 FINDINGS: Accentuated cervical lordosis. Normal alignment. No fracture or mass. Several hemangiomata are noted in the vertebral bodies which appear benign. Spinal cord signal normal. No cord compression or cord lesion. Cervical medullary junction normal. C2-3: Mild foraminal narrowing due to facet hypertrophy bilaterally. C3-4: Mild disc and facet degeneration. Moderate foraminal narrowing bilaterally. C4-5: Disc degeneration and spurring. Bilateral facet hypertrophy. Mild spinal stenosis and moderate foraminal narrowing bilaterally. No change from the prior study C5-6: Mild degenerative change without significant spinal stenosis. Moderate foraminal narrowing bilaterally C6-7: Moderate disc degeneration and disc space narrowing. Diffuse uncinate spurring causing moderate to severe foraminal encroachment bilaterally. C7-T1: Bilateral facet hypertrophy causing moderate to severe foraminal narrowing bilaterally. Mild spinal stenosis. IMPRESSION: Multilevel cervical spondylosis causing spinal and foraminal stenosis as described above. No  significant change from 08/09/2011. Electronically Signed   By: Marlan Palau M.D.   On: 03/16/2016 16:02   Ct Biopsy  03/21/2016  INDICATION: Pancytopenia, concern for myelodysplastic syndrome EXAM: CT GUIDED RIGHT ILIAC BONE MARROW ASPIRATION AND CORE BIOPSY Date:  4/24/20174/24/2017 9:56 am Radiologist:  M. Ruel Favors, MD Guidance:  CT FLUOROSCOPY TIME:  Fluoroscopy Time: None. MEDICATIONS: 1% lidocaine locally ANESTHESIA/SEDATION: 75 mcg IV Fentanyl Moderate Sedation Time:  10 minutes The patient was continuously monitored during the procedure by the interventional radiology nurse under my direct supervision. [name]:  None. COMPLICATIONS: None PROCEDURE: Informed consent was obtained from the patient following explanation of the procedure, risks, benefits and alternatives. The patient understands, agrees and consents for the procedure. All questions were addressed. A time out was performed. The patient was positioned prone and non-[name] localization CT was performed of the pelvis to demonstrate the iliac marrow spaces. Maximal barrier sterile technique utilized including caps, mask, sterile gowns, sterile gloves, large sterile drape, hand hygiene, and Betadine prep. Under sterile conditions and local anesthesia, an 11  gauge coaxial bone biopsy needle was advanced into the right iliac marrow space. Needle position was confirmed with CT imaging. Initially, bone marrow aspiration was performed. Next, the 11 gauge outer cannula was utilized to obtain a right iliac bone marrow core biopsy. Needle was removed. Hemostasis was obtained with compression. The patient tolerated the procedure well. Samples were prepared with the cytotechnologist. No immediate complications. IMPRESSION: CT guided right iliac bone marrow aspiration and core biopsy. Electronically Signed   By: Jerilynn Mages.  Shick M.D.   On: 03/21/2016 10:18    ASSESSMENT & PLAN:   75 year old Caucasian male with  1) myelodysplastic syndrome (RAEB-2  with about 14-15% blasts) ISS-R score of 7 (Very High Risk) Cytogenetics show trisomy 8 (intermediate risk ) Presented with Subacute Anemia (with high normal MCV) + leukopenia/Neutropenia - new since September 2016. Patient has normal platelet counts at this time. Other workup as noted above 2) Anemia 3) Neutropenia no fevers. No evidence of infection. Plan - His treatment plan was discussed with Dr. Eugenio Hoes from [facility] - He is starting his as azacitidine today- We'll plan to treat him for 4 cycles and then repeat bone marrow biopsy. -Transfuse PRBCs when necessary for symptomatic anemia or if hemoglobin less than 7. We will use it irradiated and CMV negative products. -Notable for prophylactic antibiotics at this time. -Counseled the neutropenic precautions. -Will check erythropoietin level, CMV status and ferritin on next labs on 04/11/2016. -if patient has a good response to Azacytidine he could continue to be on the same medication for consider RIC AlloHSCT  At [facility]. -if he has frank progression to AML Dr Eugenio Hoes noted that they have a clinical trial with 7+3 induction alongwith possible DLI from matched donor. . Orders Placed This Encounter  Procedures  . CBC & Diff and Retic    Standing Status: Future     Number of Occurrences:      Standing Expiration Date: 05/09/2017  . Erythropoietin    Standing Status: Future     Number of Occurrences:      Standing Expiration Date: 04/04/2017  . CMV antibody, IgG (EIA)    Standing Status: Future     Number of Occurrences:      Standing Expiration Date: 04/04/2017  . CMV IgM    Standing Status: Future     Number of Occurrences:      Standing Expiration Date: 04/04/2017     All of the patients questions were answered with apparent satisfaction. The patient knows to call the clinic with any problems, questions or concerns.  I spent 25 minutes counseling the patient face to face. The total time spent in the appointment was 25 minutes and more than  50% was on counseling and direct patient cares.    Sullivan Lone MD [place] [facility] Hematology/Oncology Physician [facility]  (Office):       [phone] (Work cell):  [phone] (Fax):           [phone]

## 2016-04-04 NOTE — Telephone Encounter (Signed)
Pt will p/u sched in tx room °

## 2016-04-04 NOTE — Progress Notes (Signed)
Okay to treat with today's lab results per Dr. Irene Limbo.

## 2016-04-04 NOTE — Patient Instructions (Signed)
[facility] Discharge Instructions for Patients Receiving Chemotherapy  Today you received the following chemotherapy agents Vidaza. To help prevent nausea and vomiting after your treatment, we encourage you to take your nausea medication as directed.   If you develop nausea and vomiting that is not controlled by your nausea medication, call the clinic.   BELOW ARE SYMPTOMS THAT SHOULD BE REPORTED IMMEDIATELY:  *FEVER GREATER THAN 100.5 F  *CHILLS WITH OR WITHOUT FEVER  NAUSEA AND VOMITING THAT IS NOT CONTROLLED WITH YOUR NAUSEA MEDICATION  *UNUSUAL SHORTNESS OF BREATH  *UNUSUAL BRUISING OR BLEEDING  TENDERNESS IN MOUTH AND THROAT WITH OR WITHOUT PRESENCE OF ULCERS  *URINARY PROBLEMS  *BOWEL PROBLEMS  UNUSUAL RASH Items with * indicate a potential emergency and should be followed up as soon as possible.  Feel free to call the clinic you have any questions or concerns. The clinic phone number is [phone].  Please show the [facility] at check-in to the Emergency Department and triage nurse.  Azacitidine suspension for injection (subcutaneous use) What is this medicine? AZACITIDINE (ay Ronco) is a chemotherapy drug. This medicine reduces the growth of cancer cells and can suppress the immune system. It is used for treating myelodysplastic syndrome or some types of leukemia. This medicine may be used for other purposes; ask your health care provider or pharmacist if you have questions. What should I tell my health care provider before I take this medicine? They need to know if you have any of these conditions: -infection (especially a virus infection such as chickenpox, cold sores, or herpes) -kidney disease -liver disease -liver tumors -an unusual or allergic reaction to azacitidine, mannitol, other medicines, foods, dyes, or preservatives -pregnant or trying to get pregnant -breast-feeding How should I use this medicine? This medicine is  for injection under the skin. It is administered in a hospital or clinic by a specially trained health care professional. Talk to your pediatrician regarding the use of this medicine in children. While this drug may be prescribed for selected conditions, precautions do apply. Overdosage: If you think you have taken too much of this medicine contact a poison control center or emergency room at once. NOTE: This medicine is only for you. Do not share this medicine with others. What if I miss a dose? It is important not to miss your dose. Call your doctor or health care professional if you are unable to keep an appointment. What may interact with this medicine? Interactions have not been studied. Give your health care provider a list of all the medicines, herbs, non-prescription drugs, or dietary supplements you use. Also tell them if you smoke, drink alcohol, or use illegal drugs. Some items may interact with your medicine. This list may not describe all possible interactions. Give your health care provider a list of all the medicines, herbs, non-prescription drugs, or dietary supplements you use. Also tell them if you smoke, drink alcohol, or use illegal drugs. Some items may interact with your medicine. What should I watch for while using this medicine? Visit your doctor for checks on your progress. This drug may make you feel generally unwell. This is not uncommon, as chemotherapy can affect healthy cells as well as cancer cells. Report any side effects. Continue your course of treatment even though you feel ill unless your doctor tells you to stop. In some cases, you may be given additional medicines to help with side effects. Follow all directions for their use. Call your  doctor or health care professional for advice if you get a fever, chills or sore throat, or other symptoms of a cold or flu. Do not treat yourself. This drug decreases your body's ability to fight infections. Try to avoid being  around people who are sick. This medicine may increase your risk to bruise or bleed. Call your doctor or health care professional if you notice any unusual bleeding. Do not have any vaccinations without your doctor's approval and avoid anyone who has recently had oral polio vaccine. Do not become pregnant while taking this medicine. Women should inform their doctor if they wish to become pregnant or think they might be pregnant. There is a potential for serious side effects to an unborn child. Talk to your health care professional or pharmacist for more information. Do not breast-feed an infant while taking this medicine. If you are a man, you should not father a child while receiving treatment. What side effects may I notice from receiving this medicine? Side effects that you should report to your doctor or health care professional as soon as possible: -allergic reactions like skin rash, itching or hives, swelling of the face, lips, or tongue -low blood counts - this medicine may decrease the number of white blood cells, red blood cells and platelets. You may be at increased risk for infections and bleeding. -signs of infection - fever or chills, cough, sore throat, pain or difficulty passing urine -signs of decreased platelets or bleeding - bruising, pinpoint red spots on the skin, black, tarry stools, blood in the urine -signs of decreased red blood cells - unusually weak or tired, fainting spells, lightheadedness -reactions at the injection site including redness, pain, itching, or bruising -breathing problems -changes in vision -fever -mouth sores -stomach pain -vomiting Side effects that usually do not require medical attention (report to your doctor or health care professional if they continue or are bothersome): -constipation -diarrhea -loss of appetite -nausea -pain or redness at the injection site -weak or tired This list may not describe all possible side effects. Call your  doctor for medical advice about side effects. You may report side effects to FDA at 1-800-FDA-1088. Where should I keep my medicine? This drug is given in a hospital or clinic and will not be stored at home. NOTE: This sheet is a summary. It may not cover all possible information. If you have questions about this medicine, talk to your doctor, pharmacist, or health care provider.    2016, Elsevier/Gold Standard. (2014-08-08 18:13:53)

## 2016-04-05 ENCOUNTER — Ambulatory Visit: Payer: Medicare Other | Admitting: Hematology

## 2016-04-05 ENCOUNTER — Other Ambulatory Visit: Payer: Medicare Other

## 2016-04-05 ENCOUNTER — Other Ambulatory Visit: Payer: Self-pay | Admitting: *Deleted

## 2016-04-05 ENCOUNTER — Ambulatory Visit (HOSPITAL_BASED_OUTPATIENT_CLINIC_OR_DEPARTMENT_OTHER): Payer: Medicare Other

## 2016-04-05 VITALS — BP 132/68 | HR 65 | Temp 98.1°F | Resp 18

## 2016-04-05 DIAGNOSIS — Z5111 Encounter for antineoplastic chemotherapy: Secondary | ICD-10-CM

## 2016-04-05 DIAGNOSIS — D4622 Refractory anemia with excess of blasts 2: Secondary | ICD-10-CM | POA: Diagnosis not present

## 2016-04-05 MED ORDER — ONDANSETRON HCL 8 MG PO TABS
8.0000 mg | ORAL_TABLET | Freq: Once | ORAL | Status: AC
  Administered 2016-04-05: 8 mg via ORAL

## 2016-04-05 MED ORDER — AZACITIDINE CHEMO SQ INJECTION
76.0000 mg/m2 | Freq: Once | INTRAMUSCULAR | Status: AC
  Administered 2016-04-05: 145 mg via SUBCUTANEOUS
  Filled 2016-04-05: qty 5.8

## 2016-04-05 MED ORDER — ONDANSETRON HCL 8 MG PO TABS
ORAL_TABLET | ORAL | Status: AC
  Filled 2016-04-05: qty 1

## 2016-04-05 NOTE — Patient Instructions (Signed)
[facility] Discharge Instructions for Patients Receiving Chemotherapy  Today you received the following chemotherapy agents Vidaza. To help prevent nausea and vomiting after your treatment, we encourage you to take your nausea medication as directed.   If you develop nausea and vomiting that is not controlled by your nausea medication, call the clinic.   BELOW ARE SYMPTOMS THAT SHOULD BE REPORTED IMMEDIATELY:  *FEVER GREATER THAN 100.5 F  *CHILLS WITH OR WITHOUT FEVER  NAUSEA AND VOMITING THAT IS NOT CONTROLLED WITH YOUR NAUSEA MEDICATION  *UNUSUAL SHORTNESS OF BREATH  *UNUSUAL BRUISING OR BLEEDING  TENDERNESS IN MOUTH AND THROAT WITH OR WITHOUT PRESENCE OF ULCERS  *URINARY PROBLEMS  *BOWEL PROBLEMS  UNUSUAL RASH Items with * indicate a potential emergency and should be followed up as soon as possible.  Feel free to call the clinic you have any questions or concerns. The clinic phone number is [phone].  Please show the [facility] at check-in to the Emergency Department and triage nurse.  Azacitidine suspension for injection (subcutaneous use) What is this medicine? AZACITIDINE (ay Ronco) is a chemotherapy drug. This medicine reduces the growth of cancer cells and can suppress the immune system. It is used for treating myelodysplastic syndrome or some types of leukemia. This medicine may be used for other purposes; ask your health care provider or pharmacist if you have questions. What should I tell my health care provider before I take this medicine? They need to know if you have any of these conditions: -infection (especially a virus infection such as chickenpox, cold sores, or herpes) -kidney disease -liver disease -liver tumors -an unusual or allergic reaction to azacitidine, mannitol, other medicines, foods, dyes, or preservatives -pregnant or trying to get pregnant -breast-feeding How should I use this medicine? This medicine is  for injection under the skin. It is administered in a hospital or clinic by a specially trained health care professional. Talk to your pediatrician regarding the use of this medicine in children. While this drug may be prescribed for selected conditions, precautions do apply. Overdosage: If you think you have taken too much of this medicine contact a poison control center or emergency room at once. NOTE: This medicine is only for you. Do not share this medicine with others. What if I miss a dose? It is important not to miss your dose. Call your doctor or health care professional if you are unable to keep an appointment. What may interact with this medicine? Interactions have not been studied. Give your health care provider a list of all the medicines, herbs, non-prescription drugs, or dietary supplements you use. Also tell them if you smoke, drink alcohol, or use illegal drugs. Some items may interact with your medicine. This list may not describe all possible interactions. Give your health care provider a list of all the medicines, herbs, non-prescription drugs, or dietary supplements you use. Also tell them if you smoke, drink alcohol, or use illegal drugs. Some items may interact with your medicine. What should I watch for while using this medicine? Visit your doctor for checks on your progress. This drug may make you feel generally unwell. This is not uncommon, as chemotherapy can affect healthy cells as well as cancer cells. Report any side effects. Continue your course of treatment even though you feel ill unless your doctor tells you to stop. In some cases, you may be given additional medicines to help with side effects. Follow all directions for their use. Call your  doctor or health care professional for advice if you get a fever, chills or sore throat, or other symptoms of a cold or flu. Do not treat yourself. This drug decreases your body's ability to fight infections. Try to avoid being  around people who are sick. This medicine may increase your risk to bruise or bleed. Call your doctor or health care professional if you notice any unusual bleeding. Do not have any vaccinations without your doctor's approval and avoid anyone who has recently had oral polio vaccine. Do not become pregnant while taking this medicine. Women should inform their doctor if they wish to become pregnant or think they might be pregnant. There is a potential for serious side effects to an unborn child. Talk to your health care professional or pharmacist for more information. Do not breast-feed an infant while taking this medicine. If you are a man, you should not father a child while receiving treatment. What side effects may I notice from receiving this medicine? Side effects that you should report to your doctor or health care professional as soon as possible: -allergic reactions like skin rash, itching or hives, swelling of the face, lips, or tongue -low blood counts - this medicine may decrease the number of white blood cells, red blood cells and platelets. You may be at increased risk for infections and bleeding. -signs of infection - fever or chills, cough, sore throat, pain or difficulty passing urine -signs of decreased platelets or bleeding - bruising, pinpoint red spots on the skin, black, tarry stools, blood in the urine -signs of decreased red blood cells - unusually weak or tired, fainting spells, lightheadedness -reactions at the injection site including redness, pain, itching, or bruising -breathing problems -changes in vision -fever -mouth sores -stomach pain -vomiting Side effects that usually do not require medical attention (report to your doctor or health care professional if they continue or are bothersome): -constipation -diarrhea -loss of appetite -nausea -pain or redness at the injection site -weak or tired This list may not describe all possible side effects. Call your  doctor for medical advice about side effects. You may report side effects to FDA at 1-800-FDA-1088. Where should I keep my medicine? This drug is given in a hospital or clinic and will not be stored at home. NOTE: This sheet is a summary. It may not cover all possible information. If you have questions about this medicine, talk to your doctor, pharmacist, or health care provider.    2016, Elsevier/Gold Standard. (2014-08-08 18:13:53)

## 2016-04-06 ENCOUNTER — Ambulatory Visit (HOSPITAL_BASED_OUTPATIENT_CLINIC_OR_DEPARTMENT_OTHER): Payer: Medicare Other

## 2016-04-06 ENCOUNTER — Other Ambulatory Visit: Payer: Self-pay | Admitting: *Deleted

## 2016-04-06 DIAGNOSIS — Z5111 Encounter for antineoplastic chemotherapy: Secondary | ICD-10-CM | POA: Diagnosis present

## 2016-04-06 DIAGNOSIS — L570 Actinic keratosis: Secondary | ICD-10-CM | POA: Diagnosis not present

## 2016-04-06 DIAGNOSIS — L720 Epidermal cyst: Secondary | ICD-10-CM | POA: Diagnosis not present

## 2016-04-06 DIAGNOSIS — D4622 Refractory anemia with excess of blasts 2: Secondary | ICD-10-CM | POA: Diagnosis not present

## 2016-04-06 DIAGNOSIS — D225 Melanocytic nevi of trunk: Secondary | ICD-10-CM | POA: Diagnosis not present

## 2016-04-06 DIAGNOSIS — L821 Other seborrheic keratosis: Secondary | ICD-10-CM | POA: Diagnosis not present

## 2016-04-06 DIAGNOSIS — L814 Other melanin hyperpigmentation: Secondary | ICD-10-CM | POA: Diagnosis not present

## 2016-04-06 DIAGNOSIS — D1801 Hemangioma of skin and subcutaneous tissue: Secondary | ICD-10-CM | POA: Diagnosis not present

## 2016-04-06 DIAGNOSIS — D2272 Melanocytic nevi of left lower limb, including hip: Secondary | ICD-10-CM | POA: Diagnosis not present

## 2016-04-06 MED ORDER — AZACITIDINE CHEMO SQ INJECTION
76.0000 mg/m2 | Freq: Once | INTRAMUSCULAR | Status: AC
  Administered 2016-04-06: 145 mg via SUBCUTANEOUS
  Filled 2016-04-06: qty 5.8

## 2016-04-06 MED ORDER — SENNOSIDES-DOCUSATE SODIUM 8.6-50 MG PO TABS: 2.0000 | ORAL_TABLET | Freq: Two times a day (BID) | ORAL | Status: DC

## 2016-04-06 MED ORDER — ONDANSETRON HCL 8 MG PO TABS: 8.0000 mg | ORAL_TABLET | Freq: Once | ORAL | Status: DC

## 2016-04-06 NOTE — Patient Instructions (Signed)
[facility] Discharge Instructions for Patients Receiving Chemotherapy  Today you received the following chemotherapy agents: Vidaza   To help prevent nausea and vomiting after your treatment, we encourage you to take your nausea medication as directed.    If you develop nausea and vomiting that is not controlled by your nausea medication, call the clinic.   BELOW ARE SYMPTOMS THAT SHOULD BE REPORTED IMMEDIATELY:  *FEVER GREATER THAN 100.5 F  *CHILLS WITH OR WITHOUT FEVER  NAUSEA AND VOMITING THAT IS NOT CONTROLLED WITH YOUR NAUSEA MEDICATION  *UNUSUAL SHORTNESS OF BREATH  *UNUSUAL BRUISING OR BLEEDING  TENDERNESS IN MOUTH AND THROAT WITH OR WITHOUT PRESENCE OF ULCERS  *URINARY PROBLEMS  *BOWEL PROBLEMS  UNUSUAL RASH Items with * indicate a potential emergency and should be followed up as soon as possible.  Feel free to call the clinic you have any questions or concerns. The clinic phone number is [phone].  Please show the CHEMO ALERT CARD at check-in to the Emergency Department and triage nurse.   

## 2016-04-07 ENCOUNTER — Ambulatory Visit: Payer: Medicare Other | Admitting: Hematology

## 2016-04-07 ENCOUNTER — Other Ambulatory Visit: Payer: Medicare Other

## 2016-04-07 ENCOUNTER — Ambulatory Visit (HOSPITAL_BASED_OUTPATIENT_CLINIC_OR_DEPARTMENT_OTHER): Payer: Medicare Other

## 2016-04-07 VITALS — BP 112/71 | HR 68 | Temp 98.6°F | Resp 16

## 2016-04-07 DIAGNOSIS — D4622 Refractory anemia with excess of blasts 2: Secondary | ICD-10-CM

## 2016-04-07 DIAGNOSIS — Z5111 Encounter for antineoplastic chemotherapy: Secondary | ICD-10-CM

## 2016-04-07 MED ORDER — AZACITIDINE CHEMO SQ INJECTION
76.0000 mg/m2 | Freq: Once | INTRAMUSCULAR | Status: AC
  Administered 2016-04-07: 145 mg via SUBCUTANEOUS
  Filled 2016-04-07: qty 1.8

## 2016-04-07 MED ORDER — ONDANSETRON HCL 8 MG PO TABS
ORAL_TABLET | ORAL | Status: AC
  Filled 2016-04-07: qty 1

## 2016-04-07 MED ORDER — ONDANSETRON HCL 8 MG PO TABS
8.0000 mg | ORAL_TABLET | Freq: Once | ORAL | Status: AC
  Administered 2016-04-07: 8 mg via ORAL

## 2016-04-07 NOTE — Patient Instructions (Signed)
[facility] Discharge Instructions for Patients Receiving Chemotherapy  Today you received the following chemotherapy agents Vidaza  To help prevent nausea and vomiting after your treatment, we encourage you to take your nausea medication as prescribed.    If you develop nausea and vomiting that is not controlled by your nausea medication, call the clinic.   BELOW ARE SYMPTOMS THAT SHOULD BE REPORTED IMMEDIATELY:  *FEVER GREATER THAN 100.5 F  *CHILLS WITH OR WITHOUT FEVER  NAUSEA AND VOMITING THAT IS NOT CONTROLLED WITH YOUR NAUSEA MEDICATION  *UNUSUAL SHORTNESS OF BREATH  *UNUSUAL BRUISING OR BLEEDING  TENDERNESS IN MOUTH AND THROAT WITH OR WITHOUT PRESENCE OF ULCERS  *URINARY PROBLEMS  *BOWEL PROBLEMS  UNUSUAL RASH Items with * indicate a potential emergency and should be followed up as soon as possible.  Feel free to call the clinic you have any questions or concerns. The clinic phone number is [phone].  Please show the CHEMO ALERT CARD at check-in to the Emergency Department and triage nurse.   

## 2016-04-08 ENCOUNTER — Ambulatory Visit (HOSPITAL_BASED_OUTPATIENT_CLINIC_OR_DEPARTMENT_OTHER): Payer: Medicare Other

## 2016-04-08 VITALS — BP 121/71 | HR 71 | Temp 98.2°F | Resp 18

## 2016-04-08 DIAGNOSIS — Z5111 Encounter for antineoplastic chemotherapy: Secondary | ICD-10-CM | POA: Diagnosis present

## 2016-04-08 DIAGNOSIS — D4622 Refractory anemia with excess of blasts 2: Secondary | ICD-10-CM | POA: Diagnosis not present

## 2016-04-08 LAB — MYCOPLASMA PNEUMONIAE AB, IGM/IGG
M. PNEUMONIAE AB, IGG: 2.63 — AB (ref <=0.90)
MYCOPLASMA PNEUMO IGM: 479 U/mL (ref <770)

## 2016-04-08 MED ORDER — ONDANSETRON HCL 8 MG PO TABS
8.0000 mg | ORAL_TABLET | Freq: Once | ORAL | Status: AC
  Administered 2016-04-08: 8 mg via ORAL

## 2016-04-08 MED ORDER — AZACITIDINE CHEMO SQ INJECTION
76.0000 mg/m2 | Freq: Once | INTRAMUSCULAR | Status: AC
  Administered 2016-04-08: 145 mg via SUBCUTANEOUS
  Filled 2016-04-08: qty 5.8

## 2016-04-08 MED ORDER — ONDANSETRON HCL 8 MG PO TABS
ORAL_TABLET | ORAL | Status: AC
  Filled 2016-04-08: qty 1

## 2016-04-11 ENCOUNTER — Ambulatory Visit (HOSPITAL_BASED_OUTPATIENT_CLINIC_OR_DEPARTMENT_OTHER): Payer: Medicare Other

## 2016-04-11 ENCOUNTER — Other Ambulatory Visit (HOSPITAL_BASED_OUTPATIENT_CLINIC_OR_DEPARTMENT_OTHER): Payer: Medicare Other

## 2016-04-11 DIAGNOSIS — D4622 Refractory anemia with excess of blasts 2: Secondary | ICD-10-CM | POA: Diagnosis not present

## 2016-04-11 DIAGNOSIS — Z5111 Encounter for antineoplastic chemotherapy: Secondary | ICD-10-CM | POA: Diagnosis present

## 2016-04-11 LAB — CBC & DIFF AND RETIC
BASO%: 0 % (ref 0.0–2.0)
Basophils Absolute: 0 10*3/uL (ref 0.0–0.1)
EOS%: 1.1 % (ref 0.0–7.0)
Eosinophils Absolute: 0 10*3/uL (ref 0.0–0.5)
HCT: 26.7 % — ABNORMAL LOW (ref 38.4–49.9)
HGB: 8 g/dL — ABNORMAL LOW (ref 13.0–17.1)
IMMATURE RETIC FRACT: 37 % — AB (ref 3.00–10.60)
LYMPH#: 1.3 10*3/uL (ref 0.9–3.3)
LYMPH%: 69.7 % — ABNORMAL HIGH (ref 14.0–49.0)
MCH: 29.2 pg (ref 27.2–33.4)
MCHC: 30 g/dL — AB (ref 32.0–36.0)
MCV: 97.4 fL (ref 79.3–98.0)
MONO#: 0 10*3/uL — ABNORMAL LOW (ref 0.1–0.9)
MONO%: 2.1 % (ref 0.0–14.0)
NEUT%: 27.1 % — AB (ref 39.0–75.0)
NEUTROS ABS: 0.5 10*3/uL — AB (ref 1.5–6.5)
PLATELETS: 216 10*3/uL (ref 140–400)
RBC: 2.74 10*6/uL — AB (ref 4.20–5.82)
RDW: 17.1 % — AB (ref 11.0–14.6)
RETIC %: 2.58 % — AB (ref 0.80–1.80)
RETIC CT ABS: 70.69 10*3/uL (ref 34.80–93.90)
WBC: 1.9 10*3/uL — AB (ref 4.0–10.3)

## 2016-04-11 LAB — FERRITIN: FERRITIN: 22 ng/mL (ref 22–316)

## 2016-04-11 MED ORDER — ONDANSETRON HCL 8 MG PO TABS
8.0000 mg | ORAL_TABLET | Freq: Once | ORAL | Status: AC
  Administered 2016-04-11: 8 mg via ORAL

## 2016-04-11 MED ORDER — AZACITIDINE CHEMO SQ INJECTION
76.0000 mg/m2 | Freq: Once | INTRAMUSCULAR | Status: AC
  Administered 2016-04-11: 145 mg via SUBCUTANEOUS
  Filled 2016-04-11: qty 5.8

## 2016-04-11 MED ORDER — ONDANSETRON HCL 8 MG PO TABS
ORAL_TABLET | ORAL | Status: AC
  Filled 2016-04-11: qty 1

## 2016-04-11 NOTE — Patient Instructions (Signed)
[facility] Discharge Instructions for Patients Receiving Chemotherapy  Today you received the following chemotherapy agents Vidaza  To help prevent nausea and vomiting after your treatment, we encourage you to take your nausea medication as prescribed.    If you develop nausea and vomiting that is not controlled by your nausea medication, call the clinic.   BELOW ARE SYMPTOMS THAT SHOULD BE REPORTED IMMEDIATELY:  *FEVER GREATER THAN 100.5 F  *CHILLS WITH OR WITHOUT FEVER  NAUSEA AND VOMITING THAT IS NOT CONTROLLED WITH YOUR NAUSEA MEDICATION  *UNUSUAL SHORTNESS OF BREATH  *UNUSUAL BRUISING OR BLEEDING  TENDERNESS IN MOUTH AND THROAT WITH OR WITHOUT PRESENCE OF ULCERS  *URINARY PROBLEMS  *BOWEL PROBLEMS  UNUSUAL RASH Items with * indicate a potential emergency and should be followed up as soon as possible.  Feel free to call the clinic you have any questions or concerns. The clinic phone number is [phone].  Please show the CHEMO ALERT CARD at check-in to the Emergency Department and triage nurse.   

## 2016-04-11 NOTE — Progress Notes (Signed)
Reviewed labs with Dr. Irene Limbo. Dr. Irene Limbo gave ok to treat.

## 2016-04-12 ENCOUNTER — Ambulatory Visit (HOSPITAL_BASED_OUTPATIENT_CLINIC_OR_DEPARTMENT_OTHER): Payer: Medicare Other

## 2016-04-12 VITALS — BP 107/61 | HR 85 | Temp 98.9°F | Resp 17

## 2016-04-12 DIAGNOSIS — D4622 Refractory anemia with excess of blasts 2: Secondary | ICD-10-CM

## 2016-04-12 DIAGNOSIS — Z5111 Encounter for antineoplastic chemotherapy: Secondary | ICD-10-CM

## 2016-04-12 LAB — ERYTHROPOIETIN: Erythropoietin: 92.6 m[IU]/mL — ABNORMAL HIGH (ref 2.6–18.5)

## 2016-04-12 MED ORDER — ONDANSETRON HCL 8 MG PO TABS
8.0000 mg | ORAL_TABLET | Freq: Once | ORAL | Status: AC
  Administered 2016-04-12: 8 mg via ORAL

## 2016-04-12 MED ORDER — ONDANSETRON HCL 8 MG PO TABS
ORAL_TABLET | ORAL | Status: AC
  Filled 2016-04-12: qty 1

## 2016-04-12 MED ORDER — AZACITIDINE CHEMO SQ INJECTION
76.0000 mg/m2 | Freq: Once | INTRAMUSCULAR | Status: AC
  Administered 2016-04-12: 145 mg via SUBCUTANEOUS
  Filled 2016-04-12: qty 5.8

## 2016-04-12 NOTE — Patient Instructions (Signed)
Azacitidine suspension for injection (subcutaneous use) What is this medicine? AZACITIDINE (ay za SITE i deen) is a chemotherapy drug. This medicine reduces the growth of cancer cells and can suppress the immune system. It is used for treating myelodysplastic syndrome or some types of leukemia. This medicine may be used for other purposes; ask your health care provider or pharmacist if you have questions. What should I tell my health care provider before I take this medicine? They need to know if you have any of these conditions: -infection (especially a virus infection such as chickenpox, cold sores, or herpes) -kidney disease -liver disease -liver tumors -an unusual or allergic reaction to azacitidine, mannitol, other medicines, foods, dyes, or preservatives -pregnant or trying to get pregnant -breast-feeding How should I use this medicine? This medicine is for injection under the skin. It is administered in a hospital or clinic by a specially trained health care professional. Talk to your pediatrician regarding the use of this medicine in children. While this drug may be prescribed for selected conditions, precautions do apply. Overdosage: If you think you have taken too much of this medicine contact a poison control center or emergency room at once. NOTE: This medicine is only for you. Do not share this medicine with others. What if I miss a dose? It is important not to miss your dose. Call your doctor or health care professional if you are unable to keep an appointment. What may interact with this medicine? Interactions have not been studied. Give your health care provider a list of all the medicines, herbs, non-prescription drugs, or dietary supplements you use. Also tell them if you smoke, drink alcohol, or use illegal drugs. Some items may interact with your medicine. This list may not describe all possible interactions. Give your health care provider a list of all the medicines,  herbs, non-prescription drugs, or dietary supplements you use. Also tell them if you smoke, drink alcohol, or use illegal drugs. Some items may interact with your medicine. What should I watch for while using this medicine? Visit your doctor for checks on your progress. This drug may make you feel generally unwell. This is not uncommon, as chemotherapy can affect healthy cells as well as cancer cells. Report any side effects. Continue your course of treatment even though you feel ill unless your doctor tells you to stop. In some cases, you may be given additional medicines to help with side effects. Follow all directions for their use. Call your doctor or health care professional for advice if you get a fever, chills or sore throat, or other symptoms of a cold or flu. Do not treat yourself. This drug decreases your body's ability to fight infections. Try to avoid being around people who are sick. This medicine may increase your risk to bruise or bleed. Call your doctor or health care professional if you notice any unusual bleeding. Do not have any vaccinations without your doctor's approval and avoid anyone who has recently had oral polio vaccine. Do not become pregnant while taking this medicine. Women should inform their doctor if they wish to become pregnant or think they might be pregnant. There is a potential for serious side effects to an unborn child. Talk to your health care professional or pharmacist for more information. Do not breast-feed an infant while taking this medicine. If you are a man, you should not father a child while receiving treatment. What side effects may I notice from receiving this medicine? Side effects that you should report   to your doctor or health care professional as soon as possible: -allergic reactions like skin rash, itching or hives, swelling of the face, lips, or tongue -low blood counts - this medicine may decrease the number of white blood cells, red blood cells  and platelets. You may be at increased risk for infections and bleeding. -signs of infection - fever or chills, cough, sore throat, pain or difficulty passing urine -signs of decreased platelets or bleeding - bruising, pinpoint red spots on the skin, black, tarry stools, blood in the urine -signs of decreased red blood cells - unusually weak or tired, fainting spells, lightheadedness -reactions at the injection site including redness, pain, itching, or bruising -breathing problems -changes in vision -fever -mouth sores -stomach pain -vomiting Side effects that usually do not require medical attention (report to your doctor or health care professional if they continue or are bothersome): -constipation -diarrhea -loss of appetite -nausea -pain or redness at the injection site -weak or tired This list may not describe all possible side effects. Call your doctor for medical advice about side effects. You may report side effects to FDA at 1-800-FDA-1088. Where should I keep my medicine? This drug is given in a hospital or clinic and will not be stored at home. NOTE: This sheet is a summary. It may not cover all possible information. If you have questions about this medicine, talk to your doctor, pharmacist, or health care provider.    2016, Elsevier/Gold Standard. (2014-08-08 18:13:53)   Neutropenia Neutropenia is a condition that occurs when the level of a certain type of white blood cell (neutrophil) in your body becomes lower than normal. Neutrophils are made in the bone marrow and fight infections. These cells protect against bacteria and viruses. The fewer neutrophils you have, and the longer your body remains without them, the greater your risk of getting a severe infection becomes. CAUSES  The cause of neutropenia may be hard to determine. However, it is usually due to 3 main problems:   Decreased production of neutrophils. This may be due to:  Certain medicines such as  chemotherapy.  Genetic problems.  Cancer.  Radiation treatments.  Vitamin deficiency.  Some pesticides.  Increased destruction of neutrophils. This may be due to:  Overwhelming infections.  Hemolytic anemia. This is when the body destroys its own blood cells.  Chemotherapy.  Neutrophils moving to areas of the body where they cannot fight infections. This may be due to:  Dialysis procedures.  Conditions where the spleen becomes enlarged. Neutrophils are held in the spleen and are not available to the rest of the body.  Overwhelming infections. The neutrophils are held in the area of the infection and are not available to the rest of the body. SYMPTOMS  There are no specific symptoms of neutropenia. The lack of neutrophils can result in an infection, and an infection can cause various problems. DIAGNOSIS  Diagnosis is made by a blood test. A complete blood count is performed. The normal level of neutrophils in human blood differs with age and race. Infants have lower counts than older children and adults. African Americans have lower counts than Caucasians or Asians. The average adult level is 1500 cells/mm3 of blood. Neutrophil counts are interpreted as follows:  Greater than 1000 cells/mm3 gives normal protection against infection.  500 to 1000 cells/mm3 gives an increased risk for infection.  200 to 500 cells/mm3 is a greater risk for severe infection.  Lower than 200 cells/mm3 is a marked risk of infection. This may  require hospitalization and treatment with antibiotic medicines. TREATMENT  Treatment depends on the underlying cause, severity, and presence of infections or symptoms. It also depends on your health. Your caregiver will discuss the treatment plan with you. Mild cases are often easily treated and have a good outcome. Preventative measures may also be started to limit your risk of infections. Treatment can include:  Taking antibiotics.  Stopping medicines that  are known to cause neutropenia.  Correcting nutritional deficiencies by eating green vegetables to supply folic acid and taking vitamin B supplements.  Stopping exposure to pesticides if your neutropenia is related to pesticide exposure.  Taking a blood growth factor called sargramostim, pegfilgrastim, or filgrastim if you are undergoing chemotherapy for cancer. This stimulates white blood cell production.  Removal of the spleen if you have Felty's syndrome and have repeated infections. HOME CARE INSTRUCTIONS   Follow your caregiver's instructions about when you need to have blood work done.  Wash your hands often. Make sure others who come in contact with you also wash their hands.  Wash raw fruits and vegetables before eating them. They can carry bacteria and fungi.  Avoid people with colds or spreadable (contagious) diseases (chickenpox, herpes zoster, influenza).  Avoid large crowds.  Avoid construction areas. The dust can release fungus into the air.  Be cautious around children in daycare or school environments.  Take care of your respiratory system by coughing and deep breathing.  Bathe daily.  Protect your skin from cuts and burns.  Do not work in the garden or with flowers and plants.  Care for the mouth before and after meals by brushing with a soft toothbrush. If you have mucositis, do not use mouthwash. Mouthwash contains alcohol and can dry out the mouth even more.  Clean the area between the genitals and the anus (perineal area) after urination and bowel movements. Women need to wipe from front to back.  Use a water soluble lubricant during sexual intercourse and practice good hygiene after. Do not have intercourse if you are severely neutropenic. Check with your caregiver for guidelines.  Exercise daily as tolerated.  Avoid people who were vaccinated with a live vaccine in the past 30 days. You should not receive live vaccines (polio, typhoid).  Do not  provide direct care for pets. Avoid animal droppings. Do not clean litter boxes and bird cages.  Do not share food utensils.  Do not use tampons, enemas, or rectal suppositories unless directed by your caregiver.  Use an electric razor to remove hair.  Wash your hands after handling magazines, letters, and newspapers. SEEK IMMEDIATE MEDICAL CARE IF:   You have a fever.  You have chills or start to shake.  You feel nauseous or vomit.  You develop mouth sores.  You develop aches and pains.  You have redness and swelling around open wounds.  Your skin is warm to the touch.  You have pus coming from your wounds.  You develop swollen lymph nodes.  You feel weak or fatigued.  You develop red streaks on the skin. MAKE SURE YOU:  Understand these instructions.  Will watch your condition.  Will get help right away if you are not doing well or get worse.   This information is not intended to replace advice given to you by your health care provider. Make sure you discuss any questions you have with your health care provider.   Document Released: 05/06/2002 Document Revised: 02/06/2012 Document Reviewed: 05/27/2015 Elsevier Interactive Patient Education [employer].

## 2016-04-15 ENCOUNTER — Encounter (HOSPITAL_COMMUNITY): Payer: Self-pay

## 2016-04-18 ENCOUNTER — Ambulatory Visit (HOSPITAL_BASED_OUTPATIENT_CLINIC_OR_DEPARTMENT_OTHER): Payer: Medicare Other | Admitting: Hematology

## 2016-04-18 ENCOUNTER — Telehealth: Payer: Self-pay | Admitting: Hematology

## 2016-04-18 ENCOUNTER — Other Ambulatory Visit: Payer: Medicare Other

## 2016-04-18 ENCOUNTER — Encounter: Payer: Self-pay | Admitting: Hematology

## 2016-04-18 ENCOUNTER — Other Ambulatory Visit (HOSPITAL_BASED_OUTPATIENT_CLINIC_OR_DEPARTMENT_OTHER): Payer: Medicare Other

## 2016-04-18 ENCOUNTER — Telehealth: Payer: Self-pay | Admitting: *Deleted

## 2016-04-18 VITALS — BP 140/56 | HR 75 | Temp 98.1°F | Resp 20 | Ht 67.0 in | Wt 170.7 lb

## 2016-04-18 DIAGNOSIS — D709 Neutropenia, unspecified: Secondary | ICD-10-CM

## 2016-04-18 DIAGNOSIS — D4622 Refractory anemia with excess of blasts 2: Secondary | ICD-10-CM | POA: Diagnosis not present

## 2016-04-18 DIAGNOSIS — D649 Anemia, unspecified: Secondary | ICD-10-CM | POA: Diagnosis not present

## 2016-04-18 DIAGNOSIS — D509 Iron deficiency anemia, unspecified: Secondary | ICD-10-CM | POA: Insufficient documentation

## 2016-04-18 LAB — CBC & DIFF AND RETIC
BASO%: 0 % (ref 0.0–2.0)
Basophils Absolute: 0 10*3/uL (ref 0.0–0.1)
EOS ABS: 0 10*3/uL (ref 0.0–0.5)
EOS%: 1.4 % (ref 0.0–7.0)
HEMATOCRIT: 27.4 % — AB (ref 38.4–49.9)
HEMOGLOBIN: 8.2 g/dL — AB (ref 13.0–17.1)
IMMATURE RETIC FRACT: 20.3 % — AB (ref 3.00–10.60)
LYMPH#: 1 10*3/uL (ref 0.9–3.3)
LYMPH%: 73.6 % — AB (ref 14.0–49.0)
MCH: 29.4 pg (ref 27.2–33.4)
MCHC: 29.9 g/dL — ABNORMAL LOW (ref 32.0–36.0)
MCV: 98.2 fL — AB (ref 79.3–98.0)
MONO#: 0 10*3/uL — AB (ref 0.1–0.9)
MONO%: 2.1 % (ref 0.0–14.0)
NEUT#: 0.3 10*3/uL — CL (ref 1.5–6.5)
NEUT%: 22.9 % — ABNORMAL LOW (ref 39.0–75.0)
PLATELETS: 148 10*3/uL (ref 140–400)
RBC: 2.79 10*6/uL — ABNORMAL LOW (ref 4.20–5.82)
RDW: 18.3 % — ABNORMAL HIGH (ref 11.0–14.6)
Retic %: 5.32 % — ABNORMAL HIGH (ref 0.80–1.80)
Retic Ct Abs: 148.43 10*3/uL — ABNORMAL HIGH (ref 34.80–93.90)
WBC: 1.4 10*3/uL — AB (ref 4.0–10.3)

## 2016-04-18 NOTE — Telephone Encounter (Signed)
per pof to sch pt appt-*sent MW email to sch trmt-pt stated will get on MY CHART**

## 2016-04-18 NOTE — Telephone Encounter (Signed)
Per staff message and POF I have scheduled appts. Advised scheduler of appts. JMW  

## 2016-04-20 ENCOUNTER — Other Ambulatory Visit: Payer: Self-pay | Admitting: *Deleted

## 2016-04-20 NOTE — Progress Notes (Signed)
[name]    HEMATOLOGY/ONCOLOGY CONSULTATION NOTE  Date of Service: 04/18/2016  Patient Care Team: Wenda Low, MD as PCP - General (Internal Medicine)  Dr [name] ([facility])  CHIEF COMPLAINTS: Follow-up for MDS  DIAGNOSIS RAEB-2 with about 15% blasts. Trisomy 8 (Intermediate risk) on FISH studies. R-ISS ( score 7- very high risk)  INTERVAL HISTORY  Warren [name] is here for his scheduled f/u for toxicity check in 2 weeks after starting Azacytidine for very high-risk MDS with RAEB 2. He notes that he has tolerated treatment well with no acute new concerns.  No fevers or chills.  No skin rashes.  No significant new fatigue.  He has not required any other blood transfusions (since 04/04/2016) and his hemoglobin seems to have remained stable. he notes that he has been taking good precautions against infections.  No issues with bleeding or bruising. He reports that he is planning to get an additional opinion from [facility] which I informed him is quite reasonable.  MEDICAL HISTORY:  Past Medical History  Diagnosis Date  . Pulmonary nodule   . Multiple trauma      horse accident 2003 L SHOULDER  . DJD (degenerative joint disease)   . Diffuse esophageal spasm   . Diverticulosis   . Peripheral neuropathy ([place])   . Spinal stenosis   . Oral herpes   . Systolic hypertension   . Cancer [facility])     Prostate, Melanoma- lt. Shoulder (no further problems since 2010  . COPD (chronic obstructive pulmonary disease) (HCC)     Dr. Annamaria Boots follows  . OSA (obstructive sleep apnea)     mild, rare cpap use  . Complication of anesthesia     versed gave the adverse reaction pt. ,became aggitated  Recurrent HSV related cold sores. Acoustic neuroma left 1999 - s/p surgery. Pruritus ani Injury in 2003 related to a horse accident resulting in hydrothorax, pneumothorax, fractured ribs, dislocated left shoulder, rotator cuff tear on the left, left elbow damage and left ulnar nerve damage.   SURGICAL  HISTORY: Past Surgical History  Procedure Laterality Date  . Pilonidal cyst / sinus excision    . Acoustic neuroma left ear  1999  . Radical prostatectomy    . Multiple procedures for left arm      ELBOW,RIB FX PNEUMOTHORAX AFTER FALLING OFF MOUNTAIN  . Colonoscopy with propofol N/A 10/01/2013    Procedure: COLONOSCOPY WITH PROPOFOL;  Surgeon: Garlan Fair, MD;  Location: [facility];  Service: Endoscopy;  Laterality: N/A;  . Eye surgery      cosmetic surgery"brow lift" 4'15  . ,laceration to head in accident with hourse    . Esophagogastroduodenoscopy (egd) with propofol N/A 11/25/2014    Procedure: ESOPHAGOGASTRODUODENOSCOPY (EGD) WITH PROPOFOL;  Surgeon: Garlan Fair, MD;  Location: [facility];  Service: Endoscopy;  Laterality: N/A;  . Balloon dilation N/A 11/25/2014    Procedure: BALLOON DILATION;  Surgeon: Garlan Fair, MD;  Location: [facility];  Service: Endoscopy;  Laterality: N/A;    SOCIAL HISTORY: Social History   Social History  . Marital Status: Married    Spouse Name: N/A  . Number of Children: N/A  . Years of Education: N/A   Occupational History  . Not on file.   Social History Main Topics  . Smoking status: Former Smoker -- 1.00 packs/day for 40 years    Quit date: 11/29/1995  . Smokeless tobacco: Never Used  . Alcohol Use: Yes     Comment: 1 drink  per day  . Drug Use: No  . Sexual Activity: Not on file   Other Topics Concern  . Not on file   Social History Narrative   CPAP 8 advanced   Patient drinks 5 cups of caffeine daily   Exercises 3-4 times weekly   Son is a pulmonologist    FAMILY HISTORY: Family History  Problem Relation Age of Onset  . Emphysema Father   . Asthma Father   . Lung cancer Father   . Breast cancer Sister   . Breast cancer Sister   . Skin cancer Brother   . Prostate cancer Brother    Brother with a recent diagnosis of hemachromatosis Brother with CLL/SLL Mother had anemia, thalassemia. Hepatitis C  with liver cancer  ALLERGIES:  is allergic to midazolam.  MEDICATIONS:  Current Outpatient Prescriptions  Medication Sig Dispense Refill  . aspirin 81 MG tablet Take 81 mg by mouth at bedtime.     . calcium carbonate (TUMS - DOSED IN MG ELEMENTAL CALCIUM) 500 MG chewable tablet Chew 1-2 tablets by mouth daily as needed for indigestion or heartburn.    . nitroGLYCERIN (NITROSTAT) 0.4 MG SL tablet Place 1 tablet (0.4 mg total) under the tongue every 5 (five) minutes as needed for chest pain. 25 tablet 1  . omeprazole (PRILOSEC) 40 MG capsule Take 40 mg by mouth daily before lunch. 1/2 hour before.    . Probiotic Product (PROBIOTIC PO) Take 1 tablet by mouth at bedtime. Reported on 03/09/2016    . senna-docusate (SENNA S) 8.6-50 MG tablet Take 2 tablets by mouth 2 (two) times daily. 120 tablet 2  . simvastatin (ZOCOR) 10 MG tablet Take 1 tablet (10 mg total) by mouth at bedtime. 90 tablet 2  . valACYclovir (VALTREX) 500 MG tablet Take 250 mg by mouth every other day. At night to suppress herpes gingivitis    . valsartan (DIOVAN) 80 MG tablet Take 80 mg by mouth at bedtime.     No current facility-administered medications for this visit.    REVIEW OF SYSTEMS:    10 Point review of Systems was done is negative except as noted above.  PHYSICAL EXAMINATION: ECOG PERFORMANCE STATUS: 1 - Symptomatic but completely ambulatory  . Filed Vitals:   04/18/16 1446  BP: 140/56  Pulse: 75  Temp: 98.1 F (36.7 C)  Resp: 20   Filed Weights   04/18/16 1446  Weight: 170 lb 11.2 oz (77.429 kg)   .Body mass index is 26.73 kg/(m^2).  GENERAL:alert, in no acute distress and comfortable SKIN: skin color, texture, turgor are normal, no rashes or significant lesions EYES: normal, conjunctiva are pink and non-injected, sclera clear OROPHARYNX:no exudate, no erythema and lips, buccal mucosa, and tongue normal  NECK: supple, no JVD, thyroid normal size, non-tender, without nodularity LYMPH:  no  palpable lymphadenopathy in the cervical, axillary or inguinal LUNGS: clear to auscultation With somewhat decreased entry bilaterally, no wheezing HEART: regular rate & rhythm,  no murmurs and no lower extremity edema ABDOMEN: abdomen soft, non-tender, normoactive bowel sounds , no palpable hepatosplenomegaly Musculoskeletal: no cyanosis of digits. PSYCH: alert & oriented x 3 with fluent speech NEURO: no focal motor deficits.  LABORATORY DATA:  I have reviewed the data as listed  . CBC Latest Ref Rng 04/18/2016 04/11/2016 04/04/2016  WBC 4.0 - 10.3 10e3/uL 1.4(L) 1.9(L) 1.5(L)  Hemoglobin 13.0 - 17.1 g/dL 8.2(L) 8.0(L) 9.0(L)  Hematocrit 38.4 - 49.9 % 27.4(L) 26.7(L) 29.9(L)  Platelets 140 - 400 10e3/uL 148  216 214   . CBC    Component Value Date/Time   WBC 1.4* 04/18/2016 1427   WBC 1.6* 03/21/2016 0720   RBC 2.79* 04/18/2016 1427   RBC 2.79* 03/21/2016 0720   HGB 8.2* 04/18/2016 1427   HGB 8.4* 03/21/2016 0720   HCT 27.4* 04/18/2016 1427   HCT 27.6* 03/21/2016 0720   HCT 27.2* 03/18/2016 1503   PLT 148 04/18/2016 1427   PLT 231 03/21/2016 0720   MCV 98.2* 04/18/2016 1427   MCV 98.9 03/21/2016 0720   MCH 29.4 04/18/2016 1427   MCH 30.1 03/21/2016 0720   MCHC 29.9* 04/18/2016 1427   MCHC 30.4 03/21/2016 0720   RDW 18.3* 04/18/2016 1427   RDW 17.0* 03/21/2016 0720   LYMPHSABS 1.0 04/18/2016 1427   LYMPHSABS 1.2 03/21/2016 0720   MONOABS 0.0* 04/18/2016 1427   MONOABS 0.0* 03/21/2016 0720   EOSABS 0.0 04/18/2016 1427   EOSABS 0.0 03/21/2016 0720   BASOSABS 0.0 04/18/2016 1427   BASOSABS 0.0 03/21/2016 0720     Lab Results  Component Value Date   RETICCTPCT 5.32* 04/18/2016   RBC 2.79* 04/18/2016   RETICCTABS 148.43* 04/18/2016    CBC    Component Value Date/Time   WBC 1.4* 04/18/2016 1427   WBC 1.6* 03/21/2016 0720   RBC 2.79* 04/18/2016 1427   RBC 2.79* 03/21/2016 0720   HGB 8.2* 04/18/2016 1427   HGB 8.4* 03/21/2016 0720   HCT 27.4* 04/18/2016 1427    HCT 27.6* 03/21/2016 0720   HCT 27.2* 03/18/2016 1503   PLT 148 04/18/2016 1427   PLT 231 03/21/2016 0720   MCV 98.2* 04/18/2016 1427   MCV 98.9 03/21/2016 0720   MCH 29.4 04/18/2016 1427   MCH 30.1 03/21/2016 0720   MCHC 29.9* 04/18/2016 1427   MCHC 30.4 03/21/2016 0720   RDW 18.3* 04/18/2016 1427   RDW 17.0* 03/21/2016 0720   LYMPHSABS 1.0 04/18/2016 1427   LYMPHSABS 1.2 03/21/2016 0720   MONOABS 0.0* 04/18/2016 1427   MONOABS 0.0* 03/21/2016 0720   EOSABS 0.0 04/18/2016 1427   EOSABS 0.0 03/21/2016 0720   BASOSABS 0.0 04/18/2016 1427   BASOSABS 0.0 03/21/2016 0720   . CMP Latest Ref Rng 04/04/2016 04/01/2016 03/24/2016  Glucose 70 - 140 mg/dl 137 100 -  BUN 7.0 - 26.0 mg/dL 18.3 22.6 -  Creatinine 0.7 - 1.3 mg/dL 1.0 1.0 -  Sodium 136 - 145 mEq/L 141 143 -  Potassium 3.5 - 5.1 mEq/L 3.8 4.2 -  Chloride 96 - 112 mEq/L - - -  CO2 22 - 29 mEq/L 26 26 -  Calcium 8.4 - 10.4 mg/dL 8.6 8.3(L) -  Total Protein 6.4 - 8.3 g/dL 5.9(L) 5.9(L) 5.9(L)  Total Bilirubin 0.20 - 1.20 mg/dL 1.98(H) 1.26(H) -  Alkaline Phos 40 - 150 U/L 44 46 -  AST 5 - 34 U/L 19 20 -  ALT 0 - 55 U/L 16 17 -   . Lab Results  Component Value Date   LDH 208 03/18/2016   . Lab Results  Component Value Date   IRON 79 03/18/2016   TIBC 328 03/18/2016   IRONPCTSAT 24 03/18/2016   (Iron and TIBC)  Lab Results  Component Value Date   FERRITIN 22 04/11/2016   Erythropoietin: 92.6  Peripheral blood smear (personally reviewed by me)  Dimorphic red cell population with significant anisopoikilocytosis, several large and dysmorphic platelets, pelgeroid neutrophils. Rare blasts. Some atypical lymphocytes.  RADIOGRAPHIC STUDIES: I have personally reviewed the radiological images as  listed and agreed with the findings in the report. Ct Biopsy  03/21/2016  INDICATION: Pancytopenia, concern for myelodysplastic syndrome EXAM: CT GUIDED RIGHT ILIAC BONE MARROW ASPIRATION AND CORE BIOPSY Date:   4/24/20174/24/2017 9:56 am Radiologist:  M. Daryll Brod, MD Guidance:  CT FLUOROSCOPY TIME:  Fluoroscopy Time: None. MEDICATIONS: 1% lidocaine locally ANESTHESIA/SEDATION: 75 mcg IV Fentanyl Moderate Sedation Time:  10 minutes The patient was continuously monitored during the procedure by the interventional radiology nurse under my direct supervision. CONTRAST:  None. COMPLICATIONS: None PROCEDURE: Informed consent was obtained from the patient following explanation of the procedure, risks, benefits and alternatives. The patient understands, agrees and consents for the procedure. All questions were addressed. A time out was performed. The patient was positioned prone and non-contrast localization CT was performed of the pelvis to demonstrate the iliac marrow spaces. Maximal barrier sterile technique utilized including caps, mask, sterile gowns, sterile gloves, large sterile drape, hand hygiene, and Betadine prep. Under sterile conditions and local anesthesia, an 11 gauge coaxial bone biopsy needle was advanced into the right iliac marrow space. Needle position was confirmed with CT imaging. Initially, bone marrow aspiration was performed. Next, the 11 gauge outer cannula was utilized to obtain a right iliac bone marrow core biopsy. Needle was removed. Hemostasis was obtained with compression. The patient tolerated the procedure well. Samples were prepared with the cytotechnologist. No immediate complications. IMPRESSION: CT guided right iliac bone marrow aspiration and core biopsy. Electronically Signed   By: Jerilynn Mages.  Shick M.D.   On: 03/21/2016 10:18    ASSESSMENT & PLAN:   75 year old Caucasian male with  1) myelodysplastic syndrome (RAEB-2 with about 14-15% blasts) ISS-R score of 7 (Very High Risk) Cytogenetics show trisomy 8 (intermediate risk ) Presented with Subacute Anemia (with high normal MCV) + leukopenia/Neutropenia - new since September 2016. Patient has normal platelet counts at this time. Other  workup as noted above 2) Anemia 3) Neutropenia no fevers. No evidence of infection. 4) Slightly elevated bilirubin level even before treatment (?GIlbert's) 5) Mild iron deficiency ferritin 16-22 Plan -patient appears to have tolerated his first cycle of azacitidine without any overt toxicities of this time. -we'll monitor weekly CBC -A repeat CMP again prior to next cycle of treatment to monitor LFTs -We'll plan to treat him for 4-6 cycles and then repeat bone marrow biopsy. -Transfuse PRBCs (CMV neg and irradiated) when necessary for symptomatic anemia or if hemoglobin less than 7. We will use it irradiated and CMV negative products. -continue neutropenic precautions. -we shall replace his iron IV to keep his ferritin levels closer to 100 this is this helps his anemia and also because we might consider ESA's.  We discussed that it is unclear if his high-grade MDS would respond to ESA's --if patient has a good response to Azacytidine he could continue to be on the same medication or consider RIC All[facility]HSCT  At [facility]. He intends to get an additional opinion at [facility] which is quite reasonable. -if he has frank progression to AML Dr Eugenio Hoes noted that they have a clinical trial with 7+3 induction alongwith possible DLI from matched donor. -patient reports that he was informed that he has about 11 potential national marrow database matches.his siblings are Engineer, maintenance (IT) too old or too sick to consider being Bone marrow donors.  Return to care for IV Feraheme x1 dose Continue weekly CBC Return to care with Dr. Irene Limbo in 2 weeks prior to starting his next cycle of chemotherapy on 05/02/2016.  All of  the patients questions were answered with apparent satisfaction. The patient knows to call the clinic with any problems, questions or concerns.  I spent 25 minutes counseling the patient face to face. The total time spent in the appointment was 25 minutes and more than 50% was on counseling and direct patient  cares.    Sullivan Lone MD [place] AAHIVMS [facility] Hematology/Oncology Physician [facility]  (Office):       [phone] (Work cell):  [phone] (Fax):           [phone]

## 2016-04-21 ENCOUNTER — Telehealth: Payer: Self-pay | Admitting: *Deleted

## 2016-04-21 NOTE — Telephone Encounter (Signed)
Pt left a VM asking if he could skip a day of vidaza and add it on to the end of the cycle due to consult at [facility].  Per Dr. Irene Limbo, he would prefer if he could schedule the [place] appointment around the time of consult that day, however if this is not feasible, the patient could move the day to the end of the cycle.  This RN left VM with patient explaining this.  Schedulers phone number provided.

## 2016-04-22 DIAGNOSIS — D469 Myelodysplastic syndrome, unspecified: Secondary | ICD-10-CM | POA: Diagnosis not present

## 2016-04-22 DIAGNOSIS — M4802 Spinal stenosis, cervical region: Secondary | ICD-10-CM | POA: Diagnosis not present

## 2016-04-26 ENCOUNTER — Telehealth: Payer: Self-pay | Admitting: *Deleted

## 2016-04-26 ENCOUNTER — Other Ambulatory Visit (HOSPITAL_BASED_OUTPATIENT_CLINIC_OR_DEPARTMENT_OTHER): Payer: Medicare Other

## 2016-04-26 ENCOUNTER — Other Ambulatory Visit: Payer: Self-pay | Admitting: *Deleted

## 2016-04-26 DIAGNOSIS — D4622 Refractory anemia with excess of blasts 2: Secondary | ICD-10-CM | POA: Diagnosis present

## 2016-04-26 DIAGNOSIS — D696 Thrombocytopenia, unspecified: Secondary | ICD-10-CM

## 2016-04-26 DIAGNOSIS — D61818 Other pancytopenia: Secondary | ICD-10-CM

## 2016-04-26 DIAGNOSIS — D649 Anemia, unspecified: Secondary | ICD-10-CM

## 2016-04-26 DIAGNOSIS — D72819 Decreased white blood cell count, unspecified: Secondary | ICD-10-CM

## 2016-04-26 DIAGNOSIS — D708 Other neutropenia: Secondary | ICD-10-CM

## 2016-04-26 DIAGNOSIS — D709 Neutropenia, unspecified: Secondary | ICD-10-CM

## 2016-04-26 LAB — CBC & DIFF AND RETIC
BASO%: 0.6 % (ref 0.0–2.0)
Basophils Absolute: 0 10*3/uL (ref 0.0–0.1)
EOS%: 0.6 % (ref 0.0–7.0)
Eosinophils Absolute: 0 10*3/uL (ref 0.0–0.5)
HCT: 26.8 % — ABNORMAL LOW (ref 38.4–49.9)
HEMOGLOBIN: 7.9 g/dL — AB (ref 13.0–17.1)
IMMATURE RETIC FRACT: 17.9 % — AB (ref 3.00–10.60)
LYMPH%: 81.1 % — AB (ref 14.0–49.0)
MCH: 29 pg (ref 27.2–33.4)
MCHC: 29.5 g/dL — ABNORMAL LOW (ref 32.0–36.0)
MCV: 98.5 fL — AB (ref 79.3–98.0)
MONO#: 0 10*3/uL — AB (ref 0.1–0.9)
MONO%: 1.9 % (ref 0.0–14.0)
NEUT%: 15.8 % — ABNORMAL LOW (ref 39.0–75.0)
NEUTROS ABS: 0.3 10*3/uL — AB (ref 1.5–6.5)
NRBC: 0 % (ref 0–0)
Platelets: 177 10*3/uL (ref 140–400)
RBC: 2.72 10*6/uL — ABNORMAL LOW (ref 4.20–5.82)
RDW: 18.7 % — AB (ref 11.0–14.6)
Retic %: 4.82 % — ABNORMAL HIGH (ref 0.80–1.80)
Retic Ct Abs: 131.1 10*3/uL — ABNORMAL HIGH (ref 34.80–93.90)
WBC: 1.6 10*3/uL — AB (ref 4.0–10.3)
lymph#: 1.3 10*3/uL (ref 0.9–3.3)

## 2016-04-26 LAB — TECHNOLOGIST REVIEW

## 2016-04-26 NOTE — Telephone Encounter (Signed)
Per patient request I have move 6/7 appt to late in the afternoon. Patient aware of date/time

## 2016-04-27 DIAGNOSIS — D469 Myelodysplastic syndrome, unspecified: Secondary | ICD-10-CM | POA: Diagnosis not present

## 2016-05-02 ENCOUNTER — Encounter: Payer: Self-pay | Admitting: Hematology

## 2016-05-02 ENCOUNTER — Telehealth: Payer: Self-pay | Admitting: Hematology

## 2016-05-02 ENCOUNTER — Ambulatory Visit: Payer: Medicare Other

## 2016-05-02 ENCOUNTER — Other Ambulatory Visit: Payer: Self-pay | Admitting: *Deleted

## 2016-05-02 ENCOUNTER — Ambulatory Visit (HOSPITAL_BASED_OUTPATIENT_CLINIC_OR_DEPARTMENT_OTHER): Payer: Medicare Other | Admitting: Hematology

## 2016-05-02 ENCOUNTER — Other Ambulatory Visit (HOSPITAL_BASED_OUTPATIENT_CLINIC_OR_DEPARTMENT_OTHER): Payer: Medicare Other

## 2016-05-02 ENCOUNTER — Ambulatory Visit (HOSPITAL_BASED_OUTPATIENT_CLINIC_OR_DEPARTMENT_OTHER): Payer: Medicare Other

## 2016-05-02 VITALS — BP 129/59 | HR 73 | Temp 98.3°F | Resp 16 | Ht 67.0 in | Wt 169.1 lb

## 2016-05-02 VITALS — BP 122/66 | HR 59 | Resp 16

## 2016-05-02 DIAGNOSIS — Z5111 Encounter for antineoplastic chemotherapy: Secondary | ICD-10-CM | POA: Diagnosis present

## 2016-05-02 DIAGNOSIS — D4622 Refractory anemia with excess of blasts 2: Secondary | ICD-10-CM

## 2016-05-02 DIAGNOSIS — D649 Anemia, unspecified: Secondary | ICD-10-CM | POA: Diagnosis not present

## 2016-05-02 DIAGNOSIS — D509 Iron deficiency anemia, unspecified: Secondary | ICD-10-CM

## 2016-05-02 DIAGNOSIS — D709 Neutropenia, unspecified: Secondary | ICD-10-CM

## 2016-05-02 LAB — CBC & DIFF AND RETIC
BASO%: 0.6 % (ref 0.0–2.0)
Basophils Absolute: 0 10*3/uL (ref 0.0–0.1)
EOS ABS: 0 10*3/uL (ref 0.0–0.5)
EOS%: 0.6 % (ref 0.0–7.0)
HCT: 27 % — ABNORMAL LOW (ref 38.4–49.9)
HGB: 7.8 g/dL — ABNORMAL LOW (ref 13.0–17.1)
Immature Retic Fract: 18.9 % — ABNORMAL HIGH (ref 3.00–10.60)
LYMPH%: 82.3 % — AB (ref 14.0–49.0)
MCH: 28.3 pg (ref 27.2–33.4)
MCHC: 28.9 g/dL — AB (ref 32.0–36.0)
MCV: 97.8 fL (ref 79.3–98.0)
MONO#: 0 10*3/uL — AB (ref 0.1–0.9)
MONO%: 1.9 % (ref 0.0–14.0)
NEUT%: 14.6 % — ABNORMAL LOW (ref 39.0–75.0)
NEUTROS ABS: 0.2 10*3/uL — AB (ref 1.5–6.5)
NRBC: 0 % (ref 0–0)
PLATELETS: 208 10*3/uL (ref 140–400)
RBC: 2.76 10*6/uL — AB (ref 4.20–5.82)
RDW: 18.6 % — AB (ref 11.0–14.6)
Retic %: 4.7 % — ABNORMAL HIGH (ref 0.80–1.80)
Retic Ct Abs: 129.72 10*3/uL — ABNORMAL HIGH (ref 34.80–93.90)
WBC: 1.6 10*3/uL — AB (ref 4.0–10.3)
lymph#: 1.3 10*3/uL (ref 0.9–3.3)

## 2016-05-02 LAB — COMPREHENSIVE METABOLIC PANEL
ALBUMIN: 3.6 g/dL (ref 3.5–5.0)
ALK PHOS: 51 U/L (ref 40–150)
ALT: 11 U/L (ref 0–55)
ANION GAP: 6 meq/L (ref 3–11)
AST: 20 U/L (ref 5–34)
BILIRUBIN TOTAL: 1.6 mg/dL — AB (ref 0.20–1.20)
BUN: 15.7 mg/dL (ref 7.0–26.0)
CO2: 27 mEq/L (ref 22–29)
Calcium: 8.5 mg/dL (ref 8.4–10.4)
Chloride: 109 mEq/L (ref 98–109)
Creatinine: 1.1 mg/dL (ref 0.7–1.3)
EGFR: 62 mL/min/{1.73_m2} — AB (ref >90)
GLUCOSE: 97 mg/dL (ref 70–140)
POTASSIUM: 4.2 meq/L (ref 3.5–5.1)
SODIUM: 142 meq/L (ref 136–145)
TOTAL PROTEIN: 6.4 g/dL (ref 6.4–8.3)

## 2016-05-02 MED ORDER — ONDANSETRON HCL 8 MG PO TABS
ORAL_TABLET | ORAL | Status: AC
  Filled 2016-05-02: qty 1

## 2016-05-02 MED ORDER — SODIUM CHLORIDE 0.9 % IV SOLN
Freq: Once | INTRAVENOUS | Status: AC
  Administered 2016-05-02: 16:00:00 via INTRAVENOUS

## 2016-05-02 MED ORDER — AZACITIDINE CHEMO SQ INJECTION
76.0000 mg/m2 | Freq: Once | INTRAMUSCULAR | Status: AC
  Administered 2016-05-02: 145 mg via SUBCUTANEOUS
  Filled 2016-05-02: qty 5.8

## 2016-05-02 MED ORDER — ONDANSETRON HCL 8 MG PO TABS
8.0000 mg | ORAL_TABLET | Freq: Once | ORAL | Status: AC
  Administered 2016-05-02: 8 mg via ORAL

## 2016-05-02 MED ORDER — SODIUM CHLORIDE 0.9 % IV SOLN
510.0000 mg | Freq: Once | INTRAVENOUS | Status: AC
  Administered 2016-05-02: 510 mg via INTRAVENOUS
  Filled 2016-05-02: qty 17

## 2016-05-02 NOTE — Patient Instructions (Addendum)
Ferumoxytol injection What is this medicine? FERUMOXYTOL is an iron complex. Iron is used to make healthy red blood cells, which carry oxygen and nutrients throughout the body. This medicine is used to treat iron deficiency anemia in people with chronic kidney disease. This medicine may be used for other purposes; ask your health care provider or pharmacist if you have questions. What should I tell my health care provider before I take this medicine? They need to know if you have any of these conditions: -anemia not caused by low iron levels -high levels of iron in the blood -magnetic resonance imaging (MRI) test scheduled -an unusual or allergic reaction to iron, other medicines, foods, dyes, or preservatives -pregnant or trying to get pregnant -breast-feeding How should I use this medicine? This medicine is for injection into a vein. It is given by a health care professional in a hospital or clinic setting. Talk to your pediatrician regarding the use of this medicine in children. Special care may be needed. Overdosage: If you think you have taken too much of this medicine contact a poison control center or emergency room at once. NOTE: This medicine is only for you. Do not share this medicine with others. What if I miss a dose? It is important not to miss your dose. Call your doctor or health care professional if you are unable to keep an appointment. What may interact with this medicine? This medicine may interact with the following medications: -other iron products This list may not describe all possible interactions. Give your health care provider a list of all the medicines, herbs, non-prescription drugs, or dietary supplements you use. Also tell them if you smoke, drink alcohol, or use illegal drugs. Some items may interact with your medicine. What should I watch for while using this medicine? Visit your doctor or healthcare professional regularly. Tell your doctor or healthcare  professional if your symptoms do not start to get better or if they get worse. You may need blood work done while you are taking this medicine. You may need to follow a special diet. Talk to your doctor. Foods that contain iron include: whole grains/cereals, dried fruits, beans, or peas, leafy green vegetables, and organ meats (liver, kidney). What side effects may I notice from receiving this medicine? Side effects that you should report to your doctor or health care professional as soon as possible: -allergic reactions like skin rash, itching or hives, swelling of the face, lips, or tongue -breathing problems -changes in blood pressure -feeling faint or lightheaded, falls -fever or chills -flushing, sweating, or hot feelings -swelling of the ankles or feet Side effects that usually do not require medical attention (Report these to your doctor or health care professional if they continue or are bothersome.): -diarrhea -headache -nausea, vomiting -stomach pain This list may not describe all possible side effects. Call your doctor for medical advice about side effects. You may report side effects to FDA at 1-800-FDA-1088. Where should I keep my medicine? This drug is given in a hospital or clinic and will not be stored at home. NOTE: This sheet is a summary. It may not cover all possible information. If you have questions about this medicine, talk to your doctor, pharmacist, or health care provider.    2016, Elsevier/Gold Standard. (2012-06-29 15:23:36) [facility] Discharge Instructions for Patients Receiving Chemotherapy  Today you received the following chemotherapy agents:  Vidaza.  To help prevent nausea and vomiting after your treatment, we encourage you to take your nausea  medication as prescribed.   If you develop nausea and vomiting that is not controlled by your nausea medication, call the clinic.   BELOW ARE SYMPTOMS THAT SHOULD BE REPORTED  IMMEDIATELY:  *FEVER GREATER THAN 100.5 F  *CHILLS WITH OR WITHOUT FEVER  NAUSEA AND VOMITING THAT IS NOT CONTROLLED WITH YOUR NAUSEA MEDICATION  *UNUSUAL SHORTNESS OF BREATH  *UNUSUAL BRUISING OR BLEEDING  TENDERNESS IN MOUTH AND THROAT WITH OR WITHOUT PRESENCE OF ULCERS  *URINARY PROBLEMS  *BOWEL PROBLEMS  UNUSUAL RASH Items with * indicate a potential emergency and should be followed up as soon as possible.  Feel free to call the clinic you have any questions or concerns. The clinic phone number is [phone].  Please show the [place] at check-in to the Emergency Department and triage nurse.

## 2016-05-02 NOTE — Telephone Encounter (Signed)
Pt will p/u new sched in tx room

## 2016-05-02 NOTE — Progress Notes (Signed)
Okay to treat with neutrophils of .2 todau, per Dr. Irene Limbo.

## 2016-05-03 ENCOUNTER — Ambulatory Visit (HOSPITAL_BASED_OUTPATIENT_CLINIC_OR_DEPARTMENT_OTHER): Payer: Medicare Other

## 2016-05-03 ENCOUNTER — Ambulatory Visit: Payer: Medicare Other

## 2016-05-03 DIAGNOSIS — D4622 Refractory anemia with excess of blasts 2: Secondary | ICD-10-CM | POA: Diagnosis not present

## 2016-05-03 DIAGNOSIS — Z5111 Encounter for antineoplastic chemotherapy: Secondary | ICD-10-CM | POA: Diagnosis present

## 2016-05-03 MED ORDER — AZACITIDINE CHEMO SQ INJECTION
76.0000 mg/m2 | Freq: Once | INTRAMUSCULAR | Status: AC
  Administered 2016-05-03: 145 mg via SUBCUTANEOUS
  Filled 2016-05-03: qty 5.8

## 2016-05-03 MED ORDER — ONDANSETRON HCL 8 MG PO TABS
8.0000 mg | ORAL_TABLET | Freq: Once | ORAL | Status: AC
  Administered 2016-05-03: 8 mg via ORAL

## 2016-05-03 MED ORDER — ONDANSETRON HCL 8 MG PO TABS
ORAL_TABLET | ORAL | Status: AC
  Filled 2016-05-03: qty 1

## 2016-05-04 ENCOUNTER — Ambulatory Visit: Payer: Medicare Other

## 2016-05-04 ENCOUNTER — Ambulatory Visit (HOSPITAL_BASED_OUTPATIENT_CLINIC_OR_DEPARTMENT_OTHER): Payer: Medicare Other

## 2016-05-04 VITALS — BP 106/60 | HR 69 | Temp 97.3°F | Resp 22

## 2016-05-04 DIAGNOSIS — D4622 Refractory anemia with excess of blasts 2: Secondary | ICD-10-CM

## 2016-05-04 DIAGNOSIS — Z5111 Encounter for antineoplastic chemotherapy: Secondary | ICD-10-CM

## 2016-05-04 DIAGNOSIS — D4620 Refractory anemia with excess of blasts, unspecified: Secondary | ICD-10-CM | POA: Diagnosis not present

## 2016-05-04 DIAGNOSIS — D469 Myelodysplastic syndrome, unspecified: Secondary | ICD-10-CM | POA: Diagnosis not present

## 2016-05-04 DIAGNOSIS — D472 Monoclonal gammopathy: Secondary | ICD-10-CM | POA: Diagnosis not present

## 2016-05-04 DIAGNOSIS — Z6826 Body mass index (BMI) 26.0-26.9, adult: Secondary | ICD-10-CM | POA: Diagnosis not present

## 2016-05-04 MED ORDER — ONDANSETRON HCL 8 MG PO TABS
8.0000 mg | ORAL_TABLET | Freq: Once | ORAL | Status: AC
  Administered 2016-05-04: 8 mg via ORAL

## 2016-05-04 MED ORDER — AZACITIDINE CHEMO SQ INJECTION
76.0000 mg/m2 | Freq: Once | INTRAMUSCULAR | Status: AC
  Administered 2016-05-04: 145 mg via SUBCUTANEOUS
  Filled 2016-05-04: qty 5.8

## 2016-05-04 MED ORDER — ONDANSETRON HCL 8 MG PO TABS
ORAL_TABLET | ORAL | Status: AC
  Filled 2016-05-04: qty 1

## 2016-05-04 NOTE — Patient Instructions (Signed)
Azacitidine suspension for injection (subcutaneous use) What is this medicine? AZACITIDINE (ay za SITE i deen) is a chemotherapy drug. This medicine reduces the growth of cancer cells and can suppress the immune system. It is used for treating myelodysplastic syndrome or some types of leukemia. This medicine may be used for other purposes; ask your health care provider or pharmacist if you have questions. What should I tell my health care provider before I take this medicine? They need to know if you have any of these conditions: -infection (especially a virus infection such as chickenpox, cold sores, or herpes) -kidney disease -liver disease -liver tumors -an unusual or allergic reaction to azacitidine, mannitol, other medicines, foods, dyes, or preservatives -pregnant or trying to get pregnant -breast-feeding How should I use this medicine? This medicine is for injection under the skin. It is administered in a hospital or clinic by a specially trained health care professional. Talk to your pediatrician regarding the use of this medicine in children. While this drug may be prescribed for selected conditions, precautions do apply. Overdosage: If you think you have taken too much of this medicine contact a poison control center or emergency room at once. NOTE: This medicine is only for you. Do not share this medicine with others. What if I miss a dose? It is important not to miss your dose. Call your doctor or health care professional if you are unable to keep an appointment. What may interact with this medicine? Interactions have not been studied. Give your health care provider a list of all the medicines, herbs, non-prescription drugs, or dietary supplements you use. Also tell them if you smoke, drink alcohol, or use illegal drugs. Some items may interact with your medicine. This list may not describe all possible interactions. Give your health care provider a list of all the medicines,  herbs, non-prescription drugs, or dietary supplements you use. Also tell them if you smoke, drink alcohol, or use illegal drugs. Some items may interact with your medicine. What should I watch for while using this medicine? Visit your doctor for checks on your progress. This drug may make you feel generally unwell. This is not uncommon, as chemotherapy can affect healthy cells as well as cancer cells. Report any side effects. Continue your course of treatment even though you feel ill unless your doctor tells you to stop. In some cases, you may be given additional medicines to help with side effects. Follow all directions for their use. Call your doctor or health care professional for advice if you get a fever, chills or sore throat, or other symptoms of a cold or flu. Do not treat yourself. This drug decreases your body's ability to fight infections. Try to avoid being around people who are sick. This medicine may increase your risk to bruise or bleed. Call your doctor or health care professional if you notice any unusual bleeding. Do not have any vaccinations without your doctor's approval and avoid anyone who has recently had oral polio vaccine. Do not become pregnant while taking this medicine. Women should inform their doctor if they wish to become pregnant or think they might be pregnant. There is a potential for serious side effects to an unborn child. Talk to your health care professional or pharmacist for more information. Do not breast-feed an infant while taking this medicine. If you are a man, you should not father a child while receiving treatment. What side effects may I notice from receiving this medicine? Side effects that you should report   to your doctor or health care professional as soon as possible: -allergic reactions like skin rash, itching or hives, swelling of the face, lips, or tongue -low blood counts - this medicine may decrease the number of white blood cells, red blood cells  and platelets. You may be at increased risk for infections and bleeding. -signs of infection - fever or chills, cough, sore throat, pain or difficulty passing urine -signs of decreased platelets or bleeding - bruising, pinpoint red spots on the skin, black, tarry stools, blood in the urine -signs of decreased red blood cells - unusually weak or tired, fainting spells, lightheadedness -reactions at the injection site including redness, pain, itching, or bruising -breathing problems -changes in vision -fever -mouth sores -stomach pain -vomiting Side effects that usually do not require medical attention (report to your doctor or health care professional if they continue or are bothersome): -constipation -diarrhea -loss of appetite -nausea -pain or redness at the injection site -weak or tired This list may not describe all possible side effects. Call your doctor for medical advice about side effects. You may report side effects to FDA at 1-800-FDA-1088. Where should I keep my medicine? This drug is given in a hospital or clinic and will not be stored at home. NOTE: This sheet is a summary. It may not cover all possible information. If you have questions about this medicine, talk to your doctor, pharmacist, or health care provider.    2016, Elsevier/Gold Standard. (2014-08-08 18:13:53)   Neutropenia Neutropenia is a condition that occurs when the level of a certain type of white blood cell (neutrophil) in your body becomes lower than normal. Neutrophils are made in the bone marrow and fight infections. These cells protect against bacteria and viruses. The fewer neutrophils you have, and the longer your body remains without them, the greater your risk of getting a severe infection becomes. CAUSES  The cause of neutropenia may be hard to determine. However, it is usually due to 3 main problems:   Decreased production of neutrophils. This may be due to:  Certain medicines such as  chemotherapy.  Genetic problems.  Cancer.  Radiation treatments.  Vitamin deficiency.  Some pesticides.  Increased destruction of neutrophils. This may be due to:  Overwhelming infections.  Hemolytic anemia. This is when the body destroys its own blood cells.  Chemotherapy.  Neutrophils moving to areas of the body where they cannot fight infections. This may be due to:  Dialysis procedures.  Conditions where the spleen becomes enlarged. Neutrophils are held in the spleen and are not available to the rest of the body.  Overwhelming infections. The neutrophils are held in the area of the infection and are not available to the rest of the body. SYMPTOMS  There are no specific symptoms of neutropenia. The lack of neutrophils can result in an infection, and an infection can cause various problems. DIAGNOSIS  Diagnosis is made by a blood test. A complete blood count is performed. The normal level of neutrophils in human blood differs with age and race. Infants have lower counts than older children and adults. African Americans have lower counts than Caucasians or Asians. The average adult level is 1500 cells/mm3 of blood. Neutrophil counts are interpreted as follows:  Greater than 1000 cells/mm3 gives normal protection against infection.  500 to 1000 cells/mm3 gives an increased risk for infection.  200 to 500 cells/mm3 is a greater risk for severe infection.  Lower than 200 cells/mm3 is a marked risk of infection. This may  require hospitalization and treatment with antibiotic medicines. TREATMENT  Treatment depends on the underlying cause, severity, and presence of infections or symptoms. It also depends on your health. Your caregiver will discuss the treatment plan with you. Mild cases are often easily treated and have a good outcome. Preventative measures may also be started to limit your risk of infections. Treatment can include:  Taking antibiotics.  Stopping medicines that  are known to cause neutropenia.  Correcting nutritional deficiencies by eating green vegetables to supply folic acid and taking vitamin B supplements.  Stopping exposure to pesticides if your neutropenia is related to pesticide exposure.  Taking a blood growth factor called sargramostim, pegfilgrastim, or filgrastim if you are undergoing chemotherapy for cancer. This stimulates white blood cell production.  Removal of the spleen if you have Felty's syndrome and have repeated infections. HOME CARE INSTRUCTIONS   Follow your caregiver's instructions about when you need to have blood work done.  Wash your hands often. Make sure others who come in contact with you also wash their hands.  Wash raw fruits and vegetables before eating them. They can carry bacteria and fungi.  Avoid people with colds or spreadable (contagious) diseases (chickenpox, herpes zoster, influenza).  Avoid large crowds.  Avoid construction areas. The dust can release fungus into the air.  Be cautious around children in daycare or school environments.  Take care of your respiratory system by coughing and deep breathing.  Bathe daily.  Protect your skin from cuts and burns.  Do not work in the garden or with flowers and plants.  Care for the mouth before and after meals by brushing with a soft toothbrush. If you have mucositis, do not use mouthwash. Mouthwash contains alcohol and can dry out the mouth even more.  Clean the area between the genitals and the anus (perineal area) after urination and bowel movements. Women need to wipe from front to back.  Use a water soluble lubricant during sexual intercourse and practice good hygiene after. Do not have intercourse if you are severely neutropenic. Check with your caregiver for guidelines.  Exercise daily as tolerated.  Avoid people who were vaccinated with a live vaccine in the past 30 days. You should not receive live vaccines (polio, typhoid).  Do not  provide direct care for pets. Avoid animal droppings. Do not clean litter boxes and bird cages.  Do not share food utensils.  Do not use tampons, enemas, or rectal suppositories unless directed by your caregiver.  Use an electric razor to remove hair.  Wash your hands after handling magazines, letters, and newspapers. SEEK IMMEDIATE MEDICAL CARE IF:   You have a fever.  You have chills or start to shake.  You feel nauseous or vomit.  You develop mouth sores.  You develop aches and pains.  You have redness and swelling around open wounds.  Your skin is warm to the touch.  You have pus coming from your wounds.  You develop swollen lymph nodes.  You feel weak or fatigued.  You develop red streaks on the skin. MAKE SURE YOU:  Understand these instructions.  Will watch your condition.  Will get help right away if you are not doing well or get worse.   This information is not intended to replace advice given to you by your health care provider. Make sure you discuss any questions you have with your health care provider.   Document Released: 05/06/2002 Document Revised: 02/06/2012 Document Reviewed: 05/27/2015 Elsevier Interactive Patient Education [employer].

## 2016-05-05 ENCOUNTER — Other Ambulatory Visit: Payer: Medicare Other

## 2016-05-05 ENCOUNTER — Ambulatory Visit: Payer: Medicare Other

## 2016-05-05 ENCOUNTER — Ambulatory Visit (HOSPITAL_BASED_OUTPATIENT_CLINIC_OR_DEPARTMENT_OTHER): Payer: Medicare Other

## 2016-05-05 VITALS — BP 123/67 | HR 72 | Temp 98.3°F | Resp 18

## 2016-05-05 DIAGNOSIS — Z5111 Encounter for antineoplastic chemotherapy: Secondary | ICD-10-CM | POA: Diagnosis present

## 2016-05-05 DIAGNOSIS — D4622 Refractory anemia with excess of blasts 2: Secondary | ICD-10-CM

## 2016-05-05 MED ORDER — AZACITIDINE CHEMO SQ INJECTION
76.0000 mg/m2 | Freq: Once | INTRAMUSCULAR | Status: AC
  Administered 2016-05-05: 145 mg via SUBCUTANEOUS
  Filled 2016-05-05: qty 5.8

## 2016-05-05 MED ORDER — ONDANSETRON HCL 8 MG PO TABS
8.0000 mg | ORAL_TABLET | Freq: Once | ORAL | Status: AC
  Administered 2016-05-05: 8 mg via ORAL

## 2016-05-05 MED ORDER — ONDANSETRON HCL 8 MG PO TABS
ORAL_TABLET | ORAL | Status: AC
  Filled 2016-05-05: qty 1

## 2016-05-05 NOTE — Patient Instructions (Signed)
[facility] Discharge Instructions for Patients Receiving Chemotherapy  Today you received the following chemotherapy agents Vidaza  To help prevent nausea and vomiting after your treatment, we encourage you to take your nausea medication as prescribed.    If you develop nausea and vomiting that is not controlled by your nausea medication, call the clinic.   BELOW ARE SYMPTOMS THAT SHOULD BE REPORTED IMMEDIATELY:  *FEVER GREATER THAN 100.5 F  *CHILLS WITH OR WITHOUT FEVER  NAUSEA AND VOMITING THAT IS NOT CONTROLLED WITH YOUR NAUSEA MEDICATION  *UNUSUAL SHORTNESS OF BREATH  *UNUSUAL BRUISING OR BLEEDING  TENDERNESS IN MOUTH AND THROAT WITH OR WITHOUT PRESENCE OF ULCERS  *URINARY PROBLEMS  *BOWEL PROBLEMS  UNUSUAL RASH Items with * indicate a potential emergency and should be followed up as soon as possible.  Feel free to call the clinic you have any questions or concerns. The clinic phone number is [phone].  Please show the CHEMO ALERT CARD at check-in to the Emergency Department and triage nurse.   

## 2016-05-05 NOTE — Progress Notes (Signed)
Pt brought labs from visit at [facility] today.  Labs reviewed and sent to be scanned.  WBC 1.3, Plts 261, HGB 8.7, ANC 0.4, Scr 1.11, AST 20, ALT 19, Total Bili 1.1.

## 2016-05-06 ENCOUNTER — Ambulatory Visit (HOSPITAL_BASED_OUTPATIENT_CLINIC_OR_DEPARTMENT_OTHER): Payer: Medicare Other

## 2016-05-06 ENCOUNTER — Ambulatory Visit: Payer: Medicare Other

## 2016-05-06 VITALS — BP 114/63 | HR 76 | Temp 98.4°F | Resp 16

## 2016-05-06 DIAGNOSIS — D4622 Refractory anemia with excess of blasts 2: Secondary | ICD-10-CM

## 2016-05-06 DIAGNOSIS — Z5111 Encounter for antineoplastic chemotherapy: Secondary | ICD-10-CM | POA: Diagnosis present

## 2016-05-06 MED ORDER — ONDANSETRON HCL 8 MG PO TABS
ORAL_TABLET | ORAL | Status: AC
  Filled 2016-05-06: qty 1

## 2016-05-06 MED ORDER — ONDANSETRON HCL 8 MG PO TABS
8.0000 mg | ORAL_TABLET | Freq: Once | ORAL | Status: AC
  Administered 2016-05-06: 8 mg via ORAL

## 2016-05-06 MED ORDER — AZACITIDINE CHEMO SQ INJECTION
76.0000 mg/m2 | Freq: Once | INTRAMUSCULAR | Status: AC
  Administered 2016-05-06: 145 mg via SUBCUTANEOUS
  Filled 2016-05-06: qty 5.8

## 2016-05-06 NOTE — Patient Instructions (Signed)
[facility] Discharge Instructions for Patients Receiving Chemotherapy  Today you received the following chemotherapy agents Vidaza  To help prevent nausea and vomiting after your treatment, we encourage you to take your nausea medication as prescribed.    If you develop nausea and vomiting that is not controlled by your nausea medication, call the clinic.   BELOW ARE SYMPTOMS THAT SHOULD BE REPORTED IMMEDIATELY:  *FEVER GREATER THAN 100.5 F  *CHILLS WITH OR WITHOUT FEVER  NAUSEA AND VOMITING THAT IS NOT CONTROLLED WITH YOUR NAUSEA MEDICATION  *UNUSUAL SHORTNESS OF BREATH  *UNUSUAL BRUISING OR BLEEDING  TENDERNESS IN MOUTH AND THROAT WITH OR WITHOUT PRESENCE OF ULCERS  *URINARY PROBLEMS  *BOWEL PROBLEMS  UNUSUAL RASH Items with * indicate a potential emergency and should be followed up as soon as possible.  Feel free to call the clinic you have any questions or concerns. The clinic phone number is [phone].  Please show the CHEMO ALERT CARD at check-in to the Emergency Department and triage nurse.   

## 2016-05-09 ENCOUNTER — Ambulatory Visit: Payer: Medicare Other

## 2016-05-09 ENCOUNTER — Other Ambulatory Visit (HOSPITAL_BASED_OUTPATIENT_CLINIC_OR_DEPARTMENT_OTHER): Payer: Medicare Other

## 2016-05-09 ENCOUNTER — Ambulatory Visit (HOSPITAL_BASED_OUTPATIENT_CLINIC_OR_DEPARTMENT_OTHER): Payer: Medicare Other

## 2016-05-09 ENCOUNTER — Encounter: Payer: Self-pay | Admitting: *Deleted

## 2016-05-09 VITALS — BP 115/60 | HR 79 | Temp 98.9°F | Resp 16

## 2016-05-09 DIAGNOSIS — D4622 Refractory anemia with excess of blasts 2: Secondary | ICD-10-CM | POA: Diagnosis present

## 2016-05-09 DIAGNOSIS — Z5111 Encounter for antineoplastic chemotherapy: Secondary | ICD-10-CM

## 2016-05-09 LAB — CBC & DIFF AND RETIC
BASO%: 0.5 % (ref 0.0–2.0)
BASOS ABS: 0 10*3/uL (ref 0.0–0.1)
EOS%: 0.9 % (ref 0.0–7.0)
Eosinophils Absolute: 0 10*3/uL (ref 0.0–0.5)
HEMATOCRIT: 27.2 % — AB (ref 38.4–49.9)
HEMOGLOBIN: 7.9 g/dL — AB (ref 13.0–17.1)
Immature Retic Fract: 41.1 % — ABNORMAL HIGH (ref 3.00–10.60)
LYMPH%: 64.9 % — ABNORMAL HIGH (ref 14.0–49.0)
MCH: 28.7 pg (ref 27.2–33.4)
MCHC: 29 g/dL — ABNORMAL LOW (ref 32.0–36.0)
MCV: 98.9 fL — AB (ref 79.3–98.0)
MONO#: 0.1 10*3/uL (ref 0.1–0.9)
MONO%: 5 % (ref 0.0–14.0)
NEUT#: 0.6 10*3/uL — ABNORMAL LOW (ref 1.5–6.5)
NEUT%: 28.7 % — AB (ref 39.0–75.0)
Platelets: 333 10*3/uL (ref 140–400)
RBC: 2.75 10*6/uL — ABNORMAL LOW (ref 4.20–5.82)
RDW: 18.6 % — AB (ref 11.0–14.6)
Retic %: 3.44 % — ABNORMAL HIGH (ref 0.80–1.80)
Retic Ct Abs: 94.6 10*3/uL — ABNORMAL HIGH (ref 34.80–93.90)
WBC: 2.2 10*3/uL — ABNORMAL LOW (ref 4.0–10.3)
lymph#: 1.4 10*3/uL (ref 0.9–3.3)
nRBC: 3 % — ABNORMAL HIGH (ref 0–0)

## 2016-05-09 LAB — COMPREHENSIVE METABOLIC PANEL
ALBUMIN: 3.5 g/dL (ref 3.5–5.0)
ALK PHOS: 50 U/L (ref 40–150)
ALT: 21 U/L (ref 0–55)
AST: 25 U/L (ref 5–34)
Anion Gap: 5 mEq/L (ref 3–11)
BUN: 22 mg/dL (ref 7.0–26.0)
CALCIUM: 8.7 mg/dL (ref 8.4–10.4)
CO2: 27 mEq/L (ref 22–29)
Chloride: 107 mEq/L (ref 98–109)
Creatinine: 1.1 mg/dL (ref 0.7–1.3)
EGFR: 63 mL/min/{1.73_m2} — AB (ref >90)
Glucose: 102 mg/dl (ref 70–140)
POTASSIUM: 4.9 meq/L (ref 3.5–5.1)
Sodium: 139 mEq/L (ref 136–145)
Total Bilirubin: 1.17 mg/dL (ref 0.20–1.20)
Total Protein: 6.2 g/dL — ABNORMAL LOW (ref 6.4–8.3)

## 2016-05-09 LAB — CHCC SMEAR

## 2016-05-09 MED ORDER — AZACITIDINE CHEMO SQ INJECTION
73.0000 mg/m2 | Freq: Once | INTRAMUSCULAR | Status: AC
  Administered 2016-05-09: 140 mg via SUBCUTANEOUS
  Filled 2016-05-09: qty 5.6

## 2016-05-09 MED ORDER — ONDANSETRON HCL 8 MG PO TABS
8.0000 mg | ORAL_TABLET | Freq: Once | ORAL | Status: AC
  Administered 2016-05-09: 8 mg via ORAL

## 2016-05-09 MED ORDER — ONDANSETRON HCL 8 MG PO TABS
ORAL_TABLET | ORAL | Status: AC
  Filled 2016-05-09: qty 1

## 2016-05-09 NOTE — Patient Instructions (Signed)
[facility] Discharge Instructions for Patients Receiving Chemotherapy  Today you received the following chemotherapy agents vidaza   To help prevent nausea and vomiting after your treatment, we encourage you to take your nausea medication as directed. If you develop nausea and vomiting that is not controlled by your nausea medication, call the clinic.   BELOW ARE SYMPTOMS THAT SHOULD BE REPORTED IMMEDIATELY:  *FEVER GREATER THAN 100.5 F  *CHILLS WITH OR WITHOUT FEVER  NAUSEA AND VOMITING THAT IS NOT CONTROLLED WITH YOUR NAUSEA MEDICATION  *UNUSUAL SHORTNESS OF BREATH  *UNUSUAL BRUISING OR BLEEDING  TENDERNESS IN MOUTH AND THROAT WITH OR WITHOUT PRESENCE OF ULCERS  *URINARY PROBLEMS  *BOWEL PROBLEMS  UNUSUAL RASH Items with * indicate a potential emergency and should be followed up as soon as possible.  Feel free to call the clinic you have any questions or concerns. The clinic phone number is [phone].  

## 2016-05-09 NOTE — Progress Notes (Signed)
Ok to treat with ANC 0.6.  Pt had CMET 6/7 at [facility], see scanned document in media.

## 2016-05-09 NOTE — Progress Notes (Signed)
err

## 2016-05-10 ENCOUNTER — Ambulatory Visit (HOSPITAL_BASED_OUTPATIENT_CLINIC_OR_DEPARTMENT_OTHER): Payer: Medicare Other

## 2016-05-10 ENCOUNTER — Ambulatory Visit: Payer: Medicare Other

## 2016-05-10 DIAGNOSIS — D4622 Refractory anemia with excess of blasts 2: Secondary | ICD-10-CM

## 2016-05-10 DIAGNOSIS — Z5111 Encounter for antineoplastic chemotherapy: Secondary | ICD-10-CM | POA: Diagnosis present

## 2016-05-10 MED ORDER — ONDANSETRON HCL 8 MG PO TABS
ORAL_TABLET | ORAL | Status: AC
  Filled 2016-05-10: qty 1

## 2016-05-10 MED ORDER — ONDANSETRON HCL 8 MG PO TABS
8.0000 mg | ORAL_TABLET | Freq: Once | ORAL | Status: AC
  Administered 2016-05-10: 8 mg via ORAL

## 2016-05-10 MED ORDER — AZACITIDINE CHEMO SQ INJECTION
76.0000 mg/m2 | Freq: Once | INTRAMUSCULAR | Status: AC
  Administered 2016-05-10: 145 mg via SUBCUTANEOUS
  Filled 2016-05-10: qty 5.8

## 2016-05-10 NOTE — Progress Notes (Signed)
Ok to treat with lab results from previous day per Dr. Irene Limbo.

## 2016-05-10 NOTE — Patient Instructions (Signed)
[facility] Discharge Instructions for Patients Receiving Chemotherapy  Today you received the following chemotherapy agents: Vidaza   To help prevent nausea and vomiting after your treatment, we encourage you to take your nausea medication as directed.    If you develop nausea and vomiting that is not controlled by your nausea medication, call the clinic.   BELOW ARE SYMPTOMS THAT SHOULD BE REPORTED IMMEDIATELY:  *FEVER GREATER THAN 100.5 F  *CHILLS WITH OR WITHOUT FEVER  NAUSEA AND VOMITING THAT IS NOT CONTROLLED WITH YOUR NAUSEA MEDICATION  *UNUSUAL SHORTNESS OF BREATH  *UNUSUAL BRUISING OR BLEEDING  TENDERNESS IN MOUTH AND THROAT WITH OR WITHOUT PRESENCE OF ULCERS  *URINARY PROBLEMS  *BOWEL PROBLEMS  UNUSUAL RASH Items with * indicate a potential emergency and should be followed up as soon as possible.  Feel free to call the clinic you have any questions or concerns. The clinic phone number is [phone].  Please show the CHEMO ALERT CARD at check-in to the Emergency Department and triage nurse.   

## 2016-05-12 ENCOUNTER — Telehealth: Payer: Self-pay | Admitting: Hematology

## 2016-05-12 ENCOUNTER — Other Ambulatory Visit: Payer: Self-pay | Admitting: *Deleted

## 2016-05-12 NOTE — Telephone Encounter (Signed)
cld pt to r/s appt per dr Roland Earl req to keep lab appts on 6/19-appt 6/21@1 :25

## 2016-05-13 ENCOUNTER — Telehealth: Payer: Self-pay | Admitting: *Deleted

## 2016-05-13 ENCOUNTER — Other Ambulatory Visit: Payer: Self-pay | Admitting: *Deleted

## 2016-05-13 DIAGNOSIS — D4622 Refractory anemia with excess of blasts 2: Secondary | ICD-10-CM

## 2016-05-13 NOTE — Telephone Encounter (Signed)
Per staff message and POF I have scheduled appts. Advised scheduler of appts. JMW  

## 2016-05-16 ENCOUNTER — Ambulatory Visit: Payer: Medicare Other | Admitting: Hematology

## 2016-05-16 ENCOUNTER — Other Ambulatory Visit: Payer: Medicare Other

## 2016-05-16 ENCOUNTER — Other Ambulatory Visit (HOSPITAL_BASED_OUTPATIENT_CLINIC_OR_DEPARTMENT_OTHER): Payer: Medicare Other

## 2016-05-16 DIAGNOSIS — D4622 Refractory anemia with excess of blasts 2: Secondary | ICD-10-CM

## 2016-05-16 LAB — CBC & DIFF AND RETIC
BASO%: 0 % (ref 0.0–2.0)
Basophils Absolute: 0 10*3/uL (ref 0.0–0.1)
EOS ABS: 0 10*3/uL (ref 0.0–0.5)
EOS%: 1.8 % (ref 0.0–7.0)
HCT: 26.4 % — ABNORMAL LOW (ref 38.4–49.9)
HEMOGLOBIN: 7.5 g/dL — AB (ref 13.0–17.1)
IMMATURE RETIC FRACT: 40.6 % — AB (ref 3.00–10.60)
LYMPH%: 76.5 % — AB (ref 14.0–49.0)
MCH: 28.8 pg (ref 27.2–33.4)
MCHC: 28.4 g/dL — ABNORMAL LOW (ref 32.0–36.0)
MCV: 101.5 fL — AB (ref 79.3–98.0)
MONO#: 0 10*3/uL — AB (ref 0.1–0.9)
MONO%: 1.8 % (ref 0.0–14.0)
NEUT%: 19.9 % — AB (ref 39.0–75.0)
NEUTROS ABS: 0.3 10*3/uL — AB (ref 1.5–6.5)
PLATELETS: 199 10*3/uL (ref 140–400)
RBC: 2.6 10*6/uL — ABNORMAL LOW (ref 4.20–5.82)
RDW: 21.1 % — ABNORMAL HIGH (ref 11.0–14.6)
Retic %: 10.02 % — ABNORMAL HIGH (ref 0.80–1.80)
Retic Ct Abs: 260.52 10*3/uL — ABNORMAL HIGH (ref 34.80–93.90)
WBC: 1.7 10*3/uL — ABNORMAL LOW (ref 4.0–10.3)
lymph#: 1.3 10*3/uL (ref 0.9–3.3)

## 2016-05-16 LAB — COMPREHENSIVE METABOLIC PANEL
ALBUMIN: 3.5 g/dL (ref 3.5–5.0)
ALK PHOS: 47 U/L (ref 40–150)
ALT: 16 U/L (ref 0–55)
AST: 22 U/L (ref 5–34)
Anion Gap: 5 mEq/L (ref 3–11)
BILIRUBIN TOTAL: 1.65 mg/dL — AB (ref 0.20–1.20)
BUN: 23 mg/dL (ref 7.0–26.0)
CO2: 27 mEq/L (ref 22–29)
CREATININE: 1.1 mg/dL (ref 0.7–1.3)
Calcium: 8.4 mg/dL (ref 8.4–10.4)
Chloride: 107 mEq/L (ref 98–109)
EGFR: 67 mL/min/{1.73_m2} — AB (ref >90)
GLUCOSE: 111 mg/dL (ref 70–140)
Potassium: 4.3 mEq/L (ref 3.5–5.1)
SODIUM: 140 meq/L (ref 136–145)
TOTAL PROTEIN: 6.2 g/dL — AB (ref 6.4–8.3)

## 2016-05-16 LAB — FERRITIN: Ferritin: 196 ng/ml (ref 22–316)

## 2016-05-17 ENCOUNTER — Other Ambulatory Visit: Payer: Self-pay | Admitting: *Deleted

## 2016-05-17 ENCOUNTER — Telehealth: Payer: Self-pay | Admitting: Hematology

## 2016-05-17 DIAGNOSIS — D4622 Refractory anemia with excess of blasts 2: Secondary | ICD-10-CM

## 2016-05-17 NOTE — Telephone Encounter (Signed)
Added lab per pof pt aware per pof

## 2016-05-17 NOTE — Progress Notes (Signed)
[name]    HEMATOLOGY/ONCOLOGY CLINIC NOTE  Date of Service: 05/02/2016   Patient Care Team: Wenda Low, MD as PCP - General (Internal Medicine)  Dr [name] ([facility])  CHIEF COMPLAINTS: Follow-up for MDS  DIAGNOSIS RAEB-2 with about 15% blasts. Trisomy 8 (Intermediate risk) on FISH studies. R-ISS ( score 7- very high risk)  INTERVAL HISTORY  Warren [name] is here for his scheduled f/u prior to starting his second cycle of azacitidine for very high-risk MDS. He is accompanied by his son who is a pulmonologist in [place]. Notes no fevers no chills no night sweats. Minimal fatigue but no chest pain or overt dyspnea on exertion. Notes that he is noted to be seeing Dr. Royce Macadamia at [facility] for an additional opinion regarding treatment options. No other acute new concerns. No skin rashes. No significant nausea. Minimal discomfort at the site of previous injections but that is resolving. Receiving IV iron today.  MEDICAL HISTORY:  Past Medical History  Diagnosis Date  . Pulmonary nodule   . Multiple trauma      horse accident 2003 L SHOULDER  . DJD (degenerative joint disease)   . Diffuse esophageal spasm   . Diverticulosis   . Peripheral neuropathy ([place])   . Spinal stenosis   . Oral herpes   . Systolic hypertension   . Cancer [facility])     Prostate, Melanoma- lt. Shoulder (no further problems since 2010  . COPD (chronic obstructive pulmonary disease) (HCC)     Dr. Annamaria Boots follows  . OSA (obstructive sleep apnea)     mild, rare cpap use  . Complication of anesthesia     versed gave the adverse reaction pt. ,became aggitated  Recurrent HSV related cold sores. Acoustic neuroma left 1999 - s/p surgery. Pruritus ani Injury in 2003 related to a horse accident resulting in hydrothorax, pneumothorax, fractured ribs, dislocated left shoulder, rotator cuff tear on the left, left elbow damage and left ulnar nerve damage.   SURGICAL HISTORY: Past Surgical History  Procedure  Laterality Date  . Pilonidal cyst / sinus excision    . Acoustic neuroma left ear  1999  . Radical prostatectomy    . Multiple procedures for left arm      ELBOW,RIB FX PNEUMOTHORAX AFTER FALLING OFF MOUNTAIN  . Colonoscopy with propofol N/A 10/01/2013    Procedure: COLONOSCOPY WITH PROPOFOL;  Surgeon: Garlan Fair, MD;  Location: [facility];  Service: Endoscopy;  Laterality: N/A;  . Eye surgery      cosmetic surgery"brow lift" 4'15  . ,laceration to head in accident with hourse    . Esophagogastroduodenoscopy (egd) with propofol N/A 11/25/2014    Procedure: ESOPHAGOGASTRODUODENOSCOPY (EGD) WITH PROPOFOL;  Surgeon: Garlan Fair, MD;  [facility];  Service: Endoscopy;  Laterality: N/A;  . Balloon dilation N/A 11/25/2014    Procedure: BALLOON DILATION;  Surgeon: Garlan Fair, MD;  Location: [facility];  Service: Endoscopy;  Laterality: N/A;    SOCIAL HISTORY: Social History   Social History  . Marital Status: Married    Spouse Name: N/A  . Number of Children: N/A  . Years of Education: N/A   Occupational History  . Not on file.   Social History Main Topics  . Smoking status: Former Smoker -- 1.00 packs/day for 40 years    Quit date: 11/29/1995  . Smokeless tobacco: Never Used  . Alcohol Use: Yes     Comment: 1 drink per day  . Drug Use: No  . Sexual  Activity: Not on file   Other Topics Concern  . Not on file   Social History Narrative   CPAP 8 advanced   Patient drinks 5 cups of caffeine daily   Exercises 3-4 times weekly   Son is a pulmonologist    FAMILY HISTORY: Family History  Problem Relation Age of Onset  . Emphysema Father   . Asthma Father   . Lung cancer Father   . Breast cancer Sister   . Breast cancer Sister   . Skin cancer Brother   . Prostate cancer Brother    Brother with a recent diagnosis of hemachromatosis Brother with CLL/SLL Mother had anemia, thalassemia. Hepatitis C with liver cancer  ALLERGIES:  is allergic  to midazolam.  MEDICATIONS:  Current Outpatient Prescriptions  Medication Sig Dispense Refill  . aspirin 81 MG tablet Take 81 mg by mouth at bedtime.     . calcium carbonate (TUMS - DOSED IN MG ELEMENTAL CALCIUM) 500 MG chewable tablet Chew 1-2 tablets by mouth daily as needed for indigestion or heartburn.    . nitroGLYCERIN (NITROSTAT) 0.4 MG SL tablet Place 1 tablet (0.4 mg total) under the tongue every 5 (five) minutes as needed for chest pain. 25 tablet 1  . omeprazole (PRILOSEC) 40 MG capsule Take 40 mg by mouth daily before lunch. 1/2 hour before.    . Probiotic Product (PROBIOTIC PO) Take 1 tablet by mouth at bedtime. Reported on 03/09/2016    . senna-docusate (SENNA S) 8.6-50 MG tablet Take 2 tablets by mouth 2 (two) times daily. 120 tablet 2  . simvastatin (ZOCOR) 10 MG tablet Take 1 tablet (10 mg total) by mouth at bedtime. 90 tablet 2  . valACYclovir (VALTREX) 500 MG tablet Take 250 mg by mouth every other day. At night to suppress herpes gingivitis    . valsartan (DIOVAN) 80 MG tablet Take 80 mg by mouth at bedtime.     No current facility-administered medications for this visit.    REVIEW OF SYSTEMS:    10 Point review of Systems was done is negative except as noted above.  PHYSICAL EXAMINATION: ECOG PERFORMANCE STATUS: 1 - Symptomatic but completely ambulatory  . Filed Vitals:   05/02/16 1321  BP: 129/59  Pulse: 73  Temp: 98.3 F (36.8 C)  Resp: 16   Filed Weights   05/02/16 1321  Weight: 169 lb 1.6 oz (76.703 kg)   .Body mass index is 26.48 kg/(m^2).  GENERAL:alert, in no acute distress and comfortable SKIN: skin color, texture, turgor are normal, no rashes or significant lesions EYES: normal, conjunctiva are pink and non-injected, sclera clear OROPHARYNX:no exudate, no erythema and lips, buccal mucosa, and tongue normal  NECK: supple, no JVD, thyroid normal size, non-tender, without nodularity LYMPH:  no palpable lymphadenopathy in the cervical, axillary or  inguinal LUNGS: clear to auscultation With somewhat decreased entry bilaterally, no wheezing HEART: regular rate & rhythm,  no murmurs and no lower extremity edema ABDOMEN: abdomen soft, non-tender, normoactive bowel sounds , no palpable hepatosplenomegaly Musculoskeletal: no cyanosis of digits. PSYCH: alert & oriented x 3 with fluent speech NEURO: no focal motor deficits.  LABORATORY DATA:  I have reviewed the data as listed  . CBC Latest Ref Rng 05/02/2016  WBC 4.0 - 10.3 10e3/uL 1.6(L)  Hemoglobin 13.0 - 17.1 g/dL 7.8(L)  Hematocrit 38.4 - 49.9 % 27.0(L)  Platelets 140 - 400 10e3/uL 208   . CBC    Component Value Date/Time   WBC 1.7* 05/16/2016 1305  WBC 1.6* 03/21/2016 0720   RBC 2.60* 05/16/2016 1305   RBC 2.79* 03/21/2016 0720   HGB 7.5* 05/16/2016 1305   HGB 8.4* 03/21/2016 0720   HCT 26.4* 05/16/2016 1305   HCT 27.6* 03/21/2016 0720   HCT 27.2* 03/18/2016 1503   PLT 199 05/16/2016 1305   PLT 231 03/21/2016 0720   MCV 101.5* 05/16/2016 1305   MCV 98.9 03/21/2016 0720   MCH 28.8 05/16/2016 1305   MCH 30.1 03/21/2016 0720   MCHC 28.4* 05/16/2016 1305   MCHC 30.4 03/21/2016 0720   RDW 21.1* 05/16/2016 1305   RDW 17.0* 03/21/2016 0720   LYMPHSABS 1.3 05/16/2016 1305   LYMPHSABS 1.2 03/21/2016 0720   MONOABS 0.0* 05/16/2016 1305   MONOABS 0.0* 03/21/2016 0720   EOSABS 0.0 05/16/2016 1305   EOSABS 0.0 03/21/2016 0720   BASOSABS 0.0 05/16/2016 1305   BASOSABS 0.0 03/21/2016 0720     Lab Results  Component Value Date   RETICCTPCT 10.02* 05/16/2016   RBC 2.60* 05/16/2016   RETICCTABS 260.52* 05/16/2016   . CMP Latest Ref Rng 05/02/2016  Glucose 70 - 140 mg/dl 97  BUN 7.0 - 26.0 mg/dL 15.7  Creatinine 0.7 - 1.3 mg/dL 1.1  Sodium 136 - 145 mEq/L 142  Potassium 3.5 - 5.1 mEq/L 4.2  CO2 22 - 29 mEq/L 27  Calcium 8.4 - 10.4 mg/dL 8.5  Total Protein 6.4 - 8.3 g/dL 6.4  Total Bilirubin 0.20 - 1.20 mg/dL 1.60(H)  Alkaline Phos 40 - 150 U/L 51  AST 5 - 34 U/L  20  ALT 0 - 55 U/L 11   . Lab Results  Component Value Date   LDH 208 03/18/2016   . Lab Results  Component Value Date   IRON 79 03/18/2016   TIBC 328 03/18/2016   IRONPCTSAT 24 03/18/2016   (Iron and TIBC)  Lab Results  Component Value Date   FERRITIN 196 05/16/2016   Erythropoietin: 92.6  Peripheral blood smear (personally reviewed by me)  Dimorphic red cell population with significant anisopoikilocytosis, several large and dysmorphic platelets, pelgeroid neutrophils. Rare blasts. Some atypical lymphocytes.  RADIOGRAPHIC STUDIES: I have personally reviewed the radiological images as listed and agreed with the findings in the report. No results found.  ASSESSMENT & PLAN:   75 year old Caucasian male with  1) myelodysplastic syndrome (RAEB-2 with about 14-15% blasts) ISS-R score of 7 (Very High Risk) Cytogenetics show trisomy 8 (intermediate risk ) Presented with Subacute Anemia (with high normal MCV) + leukopenia/Neutropenia - new since September 2016. Patient has normal platelet counts at this time. Other workup as noted above 2) Anemia 3) Neutropenia no fevers. No evidence of infection. 4) Slightly elevated bilirubin level even before treatment (?GIlbert's) 5) Mild iron deficiency ferritin 16-22 Received IV Feraheme on 05/02/2016. Plan -patient appropriate for initiation of second cycle of azacitidine. -He saw Dr. Royce Macadamia at [facility] for an additional opinion regarding his treatment. -he'll be getting IV Feraheme 510 mg 1 dose -We will consider the use of Aranesp -we'll monitor weekly CBC -We'll plan to treat him for 4-6 cycles and then repeat bone marrow biopsy. -Transfuse PRBCs (CMV neg and irradiated) when necessary for symptomatic anemia or if hemoglobin less than 7. We will use it irradiated and CMV negative products. -continue neutropenic precautions. -if patient has a good response to Azacytidine he could continue to be on the same medication or  consider RIC AlloHSCT  At [facility].  -He got an additional opinion from Dr. Royce Macadamia at [facility]  [place] . I connected with Dr. Royce Macadamia regarding this. -if he has frank progression to AML Dr Eugenio Hoes noted that they have a clinical trial with 7+3 induction alongwith possible DLI from matched donor or alternative approached including nanoparticle anthracycline rx at [facility] in a clinical trial. -patient reports that he was informed that he has about 11 potential national marrow database matches.his siblings are either too old or too sick to consider being Bone marrow donors.  Continue weekly CBC Return to care with Dr. Irene Limbo in 2 weeks with rpt counts  All of the patients, wife's and son's questions were answered to their apparent satisfaction. The patient knows to call the clinic with any problems, questions or concerns.  I spent 25 minutes counseling the patient face to face. The total time spent in the appointment was 25 minutes and more than 50% was on counseling and direct patient cares.    Sullivan Lone MD [place] AAHIVMS [facility] Hematology/Oncology Physician [facility]  (Office):       [phone] (Work cell):  [phone] (Fax):           [phone]

## 2016-05-18 ENCOUNTER — Ambulatory Visit (HOSPITAL_BASED_OUTPATIENT_CLINIC_OR_DEPARTMENT_OTHER): Payer: Medicare Other | Admitting: Hematology

## 2016-05-18 ENCOUNTER — Telehealth: Payer: Self-pay | Admitting: Hematology

## 2016-05-18 ENCOUNTER — Other Ambulatory Visit: Payer: Medicare Other

## 2016-05-18 ENCOUNTER — Encounter: Payer: Self-pay | Admitting: Hematology

## 2016-05-18 VITALS — BP 108/52 | HR 82 | Temp 98.2°F | Resp 18 | Ht 67.0 in | Wt 170.6 lb

## 2016-05-18 DIAGNOSIS — D4622 Refractory anemia with excess of blasts 2: Secondary | ICD-10-CM | POA: Diagnosis not present

## 2016-05-18 DIAGNOSIS — D709 Neutropenia, unspecified: Secondary | ICD-10-CM | POA: Diagnosis not present

## 2016-05-18 DIAGNOSIS — D649 Anemia, unspecified: Secondary | ICD-10-CM

## 2016-05-18 DIAGNOSIS — K0510 Chronic gingivitis, plaque induced: Secondary | ICD-10-CM | POA: Diagnosis not present

## 2016-05-18 MED ORDER — LEVOFLOXACIN 500 MG PO TABS: 500.0000 mg | ORAL_TABLET | Freq: Every day | ORAL | Status: DC

## 2016-05-18 MED ORDER — POSACONAZOLE 100 MG PO TBEC: 300.0000 mg | DELAYED_RELEASE_TABLET | Freq: Every day | ORAL | Status: DC

## 2016-05-18 MED ORDER — CHLORHEXIDINE GLUCONATE 0.12 % MT SOLN: 10.0000 mL | Freq: Two times a day (BID) | OROMUCOSAL | Status: DC

## 2016-05-18 NOTE — Telephone Encounter (Signed)
per pfo to sch pt appt-gave pt copy of avs °

## 2016-05-19 ENCOUNTER — Other Ambulatory Visit (HOSPITAL_COMMUNITY)
Admission: RE | Admit: 2016-05-19 | Discharge: 2016-05-19 | Disposition: A | Discharge status: Home / Self Care | Payer: Medicare Other | Source: Ambulatory Visit | Attending: Hematology | Admitting: Hematology

## 2016-05-19 DIAGNOSIS — D4622 Refractory anemia with excess of blasts 2: Secondary | ICD-10-CM | POA: Insufficient documentation

## 2016-05-19 NOTE — Progress Notes (Signed)
Warren [name] Kitchen    HEMATOLOGY/ONCOLOGY CLINIC NOTE  Date of Service: 05/19/2016   Patient Care Team: Wenda Low, MD as PCP - General (Internal Medicine)  Dr [name] ([facility])  CHIEF COMPLAINTS: Follow-up for MDS  DIAGNOSIS RAEB-2 with about 15% blasts. Trisomy 8 (Intermediate risk) on FISH studies. R-ISS ( score 7- very high risk)  INTERVAL HISTORY  Warren [name] is here for his scheduled f/u in 2 week follow-up. He reports no fevers or chills. No bleeding. No mouth sores. Mild fatigue and dyspnea on exertion which has not significantly changed. Has not required significant transfusion. Hemoglobin 7.5. After discussion in detail the patient wants to hold off on transfusion and Aranesp at this time. Ferritin levels are adequate. Was recommended to go on prophylactic levofloxacin and Posaconazole by Dr. Royce Macadamia. Warren [name]  notes that he is more comfortable with continuing to follow at [facility] as opposed to [facility] for his treatments after azacitidine he had a discussion with Dr. Royce Macadamia who recommended sending out a Foundation One panel which we discussed with our pathologist and sent out today . No other acute new symptoms.    MEDICAL HISTORY:  Past Medical History  Diagnosis Date  . Pulmonary nodule   . Multiple trauma      horse accident 2003 L SHOULDER  . DJD (degenerative joint disease)   . Diffuse esophageal spasm   . Diverticulosis   . Peripheral neuropathy ([place])   . Spinal stenosis   . Oral herpes   . Systolic hypertension   . Cancer [facility])     Prostate, Melanoma- lt. Shoulder (no further problems since 2010  . COPD (chronic obstructive pulmonary disease) (HCC)     Dr. Annamaria Boots follows  . OSA (obstructive sleep apnea)     mild, rare cpap use  . Complication of anesthesia     versed gave the adverse reaction pt. ,became aggitated  Recurrent HSV related cold sores. Acoustic neuroma left 1999 - s/p surgery. Pruritus ani Injury in 2003 related to a horse accident resulting in  hydrothorax, pneumothorax, fractured ribs, dislocated left shoulder, rotator cuff tear on the left, left elbow damage and left ulnar nerve damage.   SURGICAL HISTORY: Past Surgical History  Procedure Laterality Date  . Pilonidal cyst / sinus excision    . Acoustic neuroma left ear  1999  . Radical prostatectomy    . Multiple procedures for left arm      ELBOW,RIB FX PNEUMOTHORAX AFTER FALLING OFF MOUNTAIN  . Colonoscopy with propofol N/A 10/01/2013    Procedure: COLONOSCOPY WITH PROPOFOL;  Surgeon: Garlan Fair, MD;  [facility]: [facility];  Service: Endoscopy;  Laterality: N/A;  . Eye surgery      cosmetic surgery"brow lift" 4'15  . ,laceration to head in accident with hourse    . Esophagogastroduodenoscopy (egd) with propofol N/A 11/25/2014    Procedure: ESOPHAGOGASTRODUODENOSCOPY (EGD) WITH PROPOFOL;  Surgeon: Garlan Fair, MD;  [facility];  Service: Endoscopy;  Laterality: N/A;  . Balloon dilation N/A 11/25/2014    Procedure: BALLOON DILATION;  Surgeon: Garlan Fair, MD;  [facility];  Service: Endoscopy;  Laterality: N/A;    SOCIAL HISTORY: Social History   Social History  . Marital Status: Married    Spouse Name: N/A  . Number of Children: N/A  . Years of Education: N/A   Occupational History  . Not on file.   Social History Main Topics  . Smoking status: Former Smoker -- 1.00 packs/day for 40 years  Quit date: 11/29/1995  . Smokeless tobacco: Never Used  . Alcohol Use: Yes     Comment: 1 drink per day  . Drug Use: No  . Sexual Activity: Not on file   Other Topics Concern  . Not on file   Social History Narrative   CPAP 8 advanced   Patient drinks 5 cups of caffeine daily   Exercises 3-4 times weekly   Son is a pulmonologist    FAMILY HISTORY: Family History  Problem Relation Age of Onset  . Emphysema Father   . Asthma Father   . Lung cancer Father   . Breast cancer Sister   . Breast cancer Sister   . Skin cancer  Brother   . Prostate cancer Brother    Brother with a recent diagnosis of hemachromatosis Brother with CLL/SLL Mother had anemia, thalassemia. Hepatitis C with liver cancer  ALLERGIES:  is allergic to midazolam.  MEDICATIONS:  Current Outpatient Prescriptions  Medication Sig Dispense Refill  . aspirin 81 MG tablet Take 81 mg by mouth at bedtime.     . calcium carbonate (TUMS - DOSED IN MG ELEMENTAL CALCIUM) 500 MG chewable tablet Chew 1-2 tablets by mouth daily as needed for indigestion or heartburn.    . chlorhexidine (PERIDEX) 0.12 % solution Use as directed 10 mLs in the mouth or throat 2 (two) times daily. Swish and spit. Do not swallow. 473 mL 1  . levofloxacin (LEVAQUIN) 500 MG tablet Take 1 tablet (500 mg total) by mouth daily. 30 tablet 1  . nitroGLYCERIN (NITROSTAT) 0.4 MG SL tablet Place 1 tablet (0.4 mg total) under the tongue every 5 (five) minutes as needed for chest pain. 25 tablet 1  . omeprazole (PRILOSEC) 40 MG capsule Take 40 mg by mouth daily before lunch. 1/2 hour before.    . posaconazole (NOXAFIL) 100 MG TBEC delayed-release tablet Take 3 tablets (300 mg total) by mouth daily. 90 tablet 1  . Probiotic Product (PROBIOTIC PO) Take 1 tablet by mouth at bedtime. Reported on 03/09/2016    . senna-docusate (SENNA S) 8.6-50 MG tablet Take 2 tablets by mouth 2 (two) times daily. 120 tablet 2  . valACYclovir (VALTREX) 500 MG tablet Take 250 mg by mouth every other day. At night to suppress herpes gingivitis    . valsartan (DIOVAN) 80 MG tablet Take 80 mg by mouth at bedtime.     No current facility-administered medications for this visit.    REVIEW OF SYSTEMS:    10 Point review of Systems was done is negative except as noted above.  PHYSICAL EXAMINATION: ECOG PERFORMANCE STATUS: 1 - Symptomatic but completely ambulatory  . Filed Vitals:   05/18/16 1355  BP: 108/52  Pulse: 82  Temp: 98.2 F (36.8 C)  Resp: 18   Filed Weights   05/18/16 1355  Weight: 170 lb 9.6  oz (77.384 kg)   .Body mass index is 26.71 kg/(m^2).  GENERAL:alert, in no acute distress and comfortable SKIN: skin color, texture, turgor are normal, no rashes or significant lesions EYES: normal, conjunctiva are pink and non-injected, sclera clear OROPHARYNX:no exudate, no erythema and lips, buccal mucosa, and tongue normal  NECK: supple, no JVD, thyroid normal size, non-tender, without nodularity LYMPH:  no palpable lymphadenopathy in the cervical, axillary or inguinal LUNGS: clear to auscultation With somewhat decreased entry bilaterally, no wheezing HEART: regular rate & rhythm,  no murmurs and no lower extremity edema ABDOMEN: abdomen soft, non-tender, normoactive bowel sounds , no palpable hepatosplenomegaly Musculoskeletal: no  cyanosis of digits. PSYCH: alert & oriented x 3 with fluent speech NEURO: no focal motor deficits.  LABORATORY DATA:  I have reviewed the data as listed  . CBC Latest Ref Rng 05/16/2016 05/09/2016 05/02/2016  WBC 4.0 - 10.3 10e3/uL 1.7(L) 2.2(L) 1.6(L)  Hemoglobin 13.0 - 17.1 g/dL 7.5(L) 7.9(L) 7.8(L)  Hematocrit 38.4 - 49.9 % 26.4(L) 27.2(L) 27.0(L)  Platelets 140 - 400 10e3/uL 199 333 208   . CMP Latest Ref Rng 05/16/2016 05/09/2016 05/02/2016  Glucose 70 - 140 mg/dl 111 102 97  BUN 7.0 - 26.0 mg/dL 23.0 22.0 15.7  Creatinine 0.7 - 1.3 mg/dL 1.1 1.1 1.1  Sodium 136 - 145 mEq/L 140 139 142  Potassium 3.5 - 5.1 mEq/L 4.3 4.9 4.2  CO2 22 - 29 mEq/L _0 Calcium 8.4 - 10.4 mg/dL 8.4 8.7 8.5  Total Protein 6.4 - 8.3 g/dL 6.2(L) 6.2(L) 6.4  Total Bilirubin 0.20 - 1.20 mg/dL 1.65(H) 1.17 1.60(H)  Alkaline Phos 40 - 150 U/L 47 50 51  AST 5 - 34 U/L _1 ALT 0 - 55 U/L _2 Lab Results  Component Value Date   LDH 208 03/18/2016   . Lab Results  Component Value Date   IRON 79 03/18/2016   TIBC 328 03/18/2016   IRONPCTSAT 24 03/18/2016   (Iron and TIBC)  Lab Results  Component Value Date   FERRITIN 196 05/16/2016    Erythropoietin: 92.6  Peripheral blood smear (personally reviewed by me)  Dimorphic red cell population with significant anisopoikilocytosis, several large and dysmorphic platelets, pelgeroid neutrophils. Rare blasts. Some atypical lymphocytes.  RADIOGRAPHIC STUDIES: I have personally reviewed the radiological images as listed and agreed with the findings in the report. No results found.  ASSESSMENT & PLAN:   75 year old Caucasian male with  1) myelodysplastic syndrome (RAEB-2 with about 14-15% blasts) ISS-R score of 7 (Very High Risk) Cytogenetics show trisomy 8 (intermediate risk ) Presented with Subacute Anemia (with high normal MCV) + leukopenia/Neutropenia - new since September 2016. Patient has normal platelet counts at this time. Other workup as noted above 2) Anemia - hemoglobin relatively stable in the mid-7 range . Some symptoms but not significant at this time . 3) Neutropenia no fevers. No evidence of infection. 4) Slightly elevated bilirubin level even before treatment (?GIlbert's) 5) Mild iron deficiency ferritin 16-22 Received IV Feraheme on 05/02/2016. Iron deficiency corrected  Plan -He saw Dr. Royce Macadamia at [facility] for an additional opinion regarding his treatment and notes that he would like to follow-up there [place] after he completes treatment with [place]. -Foundation One heme  panel sent out on his initial bone marrow biopsy. -Patient wants to hold off on PRBC transfusion and Aranesp at this time after detailed discussion . -Peridex for mild gingivitis. -He was started on prophylactic levofloxacin and Posaconazole as per Dr. Luis Abed recommendations for infection prophylaxis due to chronic neutropenia . I discussed with the patient that the use of prophylactic antimicrobials in MDS is a controversial area but since he will be following up for his transplant evaluations and continued cares at [facility] we will go with their recommendation. -We will hold his  Statin due to interaction with posaconazole that increases the risk of rhabdomyolysis . -We'll plan to treat him for 4-6 cycles and then repeat bone marrow biopsy. -Transfuse PRBCs (CMV neg and irradiated) when necessary for symptomatic anemia or if hemoglobin less than 7. We will use it  irradiated and CMV negative products. -continue neutropenic precautions.  Continue weekly CBC Return to care with Dr. Irene Limbo and for his third cycle of Vidaza on 06/01/2016 with repeat labs .  All of the patients, wife's and son's questions were answered to their apparent satisfaction. The patient knows to call the clinic with any problems, questions or concerns.  I spent 25 minutes counseling the patient face to face. The total time spent in the appointment was 30 minutes and more than 50% was on counseling and direct patient cares.    Sullivan Lone MD [place] AAHIVMS [facility] Hematology/Oncology Physician [facility]  (Office):       [phone] (Work cell):  [phone] (Fax):           [phone]

## 2016-05-23 ENCOUNTER — Other Ambulatory Visit (HOSPITAL_BASED_OUTPATIENT_CLINIC_OR_DEPARTMENT_OTHER): Payer: Medicare Other

## 2016-05-23 DIAGNOSIS — D4622 Refractory anemia with excess of blasts 2: Secondary | ICD-10-CM

## 2016-05-23 LAB — CBC & DIFF AND RETIC
BASO%: 0 % (ref 0.0–2.0)
BASOS ABS: 0 10*3/uL (ref 0.0–0.1)
EOS%: 1.4 % (ref 0.0–7.0)
Eosinophils Absolute: 0 10*3/uL (ref 0.0–0.5)
HCT: 28.8 % — ABNORMAL LOW (ref 38.4–49.9)
HGB: 8 g/dL — ABNORMAL LOW (ref 13.0–17.1)
Immature Retic Fract: 25.4 % — ABNORMAL HIGH (ref 3.00–10.60)
LYMPH#: 1.1 10*3/uL (ref 0.9–3.3)
LYMPH%: 77.5 % — ABNORMAL HIGH (ref 14.0–49.0)
MCH: 28.6 pg (ref 27.2–33.4)
MCHC: 27.8 g/dL — AB (ref 32.0–36.0)
MCV: 102.9 fL — ABNORMAL HIGH (ref 79.3–98.0)
MONO#: 0.1 10*3/uL (ref 0.1–0.9)
MONO%: 4.9 % (ref 0.0–14.0)
NEUT%: 16.2 % — ABNORMAL LOW (ref 39.0–75.0)
NEUTROS ABS: 0.2 10*3/uL — AB (ref 1.5–6.5)
Platelets: 202 10*3/uL (ref 140–400)
RBC: 2.8 10*6/uL — AB (ref 4.20–5.82)
RDW: 20.8 % — AB (ref 11.0–14.6)
RETIC %: 7.63 % — AB (ref 0.80–1.80)
RETIC CT ABS: 213.64 10*3/uL — AB (ref 34.80–93.90)
WBC: 1.4 10*3/uL — AB (ref 4.0–10.3)

## 2016-05-24 ENCOUNTER — Encounter: Payer: Self-pay | Admitting: Hematology

## 2016-05-24 NOTE — Progress Notes (Signed)
sending for prior auth noxafil

## 2016-05-24 NOTE — Progress Notes (Signed)
Prior auth done with Jay(hard accent he was very hard to understand when asking clinical questions Case [id number]

## 2016-05-26 ENCOUNTER — Encounter: Payer: Self-pay | Admitting: Hematology

## 2016-05-26 NOTE — Progress Notes (Signed)
humana form left for dr kale to sign for noxafil

## 2016-05-30 ENCOUNTER — Encounter: Payer: Self-pay | Admitting: Hematology

## 2016-05-30 NOTE — Progress Notes (Signed)
hum[employer] approved noxafil until 05/26/18-sent to medical recoreds

## 2016-06-01 ENCOUNTER — Telehealth: Payer: Self-pay | Admitting: Hematology

## 2016-06-01 ENCOUNTER — Ambulatory Visit (HOSPITAL_BASED_OUTPATIENT_CLINIC_OR_DEPARTMENT_OTHER): Payer: Medicare Other | Admitting: Hematology

## 2016-06-01 ENCOUNTER — Ambulatory Visit (HOSPITAL_BASED_OUTPATIENT_CLINIC_OR_DEPARTMENT_OTHER): Payer: Medicare Other

## 2016-06-01 ENCOUNTER — Other Ambulatory Visit: Payer: Medicare Other

## 2016-06-01 ENCOUNTER — Other Ambulatory Visit (HOSPITAL_BASED_OUTPATIENT_CLINIC_OR_DEPARTMENT_OTHER): Payer: Medicare Other

## 2016-06-01 ENCOUNTER — Encounter: Payer: Self-pay | Admitting: Hematology

## 2016-06-01 VITALS — BP 138/61 | HR 83 | Temp 98.6°F | Resp 18 | Ht 67.0 in | Wt 169.3 lb

## 2016-06-01 DIAGNOSIS — D4622 Refractory anemia with excess of blasts 2: Secondary | ICD-10-CM

## 2016-06-01 DIAGNOSIS — D649 Anemia, unspecified: Secondary | ICD-10-CM

## 2016-06-01 DIAGNOSIS — D709 Neutropenia, unspecified: Secondary | ICD-10-CM

## 2016-06-01 LAB — CBC & DIFF AND RETIC
BASO%: 0 % (ref 0.0–2.0)
BASOS ABS: 0 10*3/uL (ref 0.0–0.1)
EOS ABS: 0 10*3/uL (ref 0.0–0.5)
EOS%: 1.4 % (ref 0.0–7.0)
HEMATOCRIT: 30.3 % — AB (ref 38.4–49.9)
HEMOGLOBIN: 8.5 g/dL — AB (ref 13.0–17.1)
Immature Retic Fract: 23.4 % — ABNORMAL HIGH (ref 3.00–10.60)
LYMPH%: 78.4 % — AB (ref 14.0–49.0)
MCH: 28.2 pg (ref 27.2–33.4)
MCHC: 28.1 g/dL — AB (ref 32.0–36.0)
MCV: 100.7 fL — AB (ref 79.3–98.0)
MONO#: 0.1 10*3/uL (ref 0.1–0.9)
MONO%: 5.8 % (ref 0.0–14.0)
NEUT#: 0.2 10*3/uL — CL (ref 1.5–6.5)
NEUT%: 14.4 % — AB (ref 39.0–75.0)
PLATELETS: 238 10*3/uL (ref 140–400)
RBC: 3.01 10*6/uL — ABNORMAL LOW (ref 4.20–5.82)
RDW: 19.1 % — AB (ref 11.0–14.6)
RETIC %: 6.03 % — AB (ref 0.80–1.80)
Retic Ct Abs: 181.5 10*3/uL — ABNORMAL HIGH (ref 34.80–93.90)
WBC: 1.4 10*3/uL — ABNORMAL LOW (ref 4.0–10.3)
lymph#: 1.1 10*3/uL (ref 0.9–3.3)

## 2016-06-01 LAB — COMPREHENSIVE METABOLIC PANEL
ALBUMIN: 3.4 g/dL — AB (ref 3.5–5.0)
ALT: 14 U/L (ref 0–55)
ANION GAP: 8 meq/L (ref 3–11)
AST: 22 U/L (ref 5–34)
Alkaline Phosphatase: 47 U/L (ref 40–150)
BUN: 17.9 mg/dL (ref 7.0–26.0)
CALCIUM: 8.4 mg/dL (ref 8.4–10.4)
CHLORIDE: 109 meq/L (ref 98–109)
CO2: 24 mEq/L (ref 22–29)
Creatinine: 1 mg/dL (ref 0.7–1.3)
EGFR: 69 mL/min/{1.73_m2} — ABNORMAL LOW (ref >90)
Glucose: 94 mg/dl (ref 70–140)
POTASSIUM: 4 meq/L (ref 3.5–5.1)
Sodium: 141 mEq/L (ref 136–145)
Total Bilirubin: 1.69 mg/dL — ABNORMAL HIGH (ref 0.20–1.20)
Total Protein: 6.1 g/dL — ABNORMAL LOW (ref 6.4–8.3)

## 2016-06-01 MED ORDER — ONDANSETRON HCL 8 MG PO TABS
8.0000 mg | ORAL_TABLET | Freq: Once | ORAL | Status: AC
  Administered 2016-06-01: 8 mg via ORAL

## 2016-06-01 MED ORDER — ONDANSETRON HCL 8 MG PO TABS
ORAL_TABLET | ORAL | Status: AC
  Filled 2016-06-01: qty 1

## 2016-06-01 MED ORDER — AZACITIDINE CHEMO SQ INJECTION
75.9000 mg/m2 | Freq: Once | INTRAMUSCULAR | Status: AC
  Administered 2016-06-01: 145 mg via SUBCUTANEOUS
  Filled 2016-06-01: qty 5.8

## 2016-06-01 NOTE — Progress Notes (Signed)
Ok to treat with ANC 0.2 per Dr. Irene Limbo

## 2016-06-01 NOTE — Patient Instructions (Signed)
[facility] Discharge Instructions for Patients Receiving Chemotherapy  Today you received the following chemotherapy agents: Vidaza   To help prevent nausea and vomiting after your treatment, we encourage you to take your nausea medication as directed.    If you develop nausea and vomiting that is not controlled by your nausea medication, call the clinic.   BELOW ARE SYMPTOMS THAT SHOULD BE REPORTED IMMEDIATELY:  *FEVER GREATER THAN 100.5 F  *CHILLS WITH OR WITHOUT FEVER  NAUSEA AND VOMITING THAT IS NOT CONTROLLED WITH YOUR NAUSEA MEDICATION  *UNUSUAL SHORTNESS OF BREATH  *UNUSUAL BRUISING OR BLEEDING  TENDERNESS IN MOUTH AND THROAT WITH OR WITHOUT PRESENCE OF ULCERS  *URINARY PROBLEMS  *BOWEL PROBLEMS  UNUSUAL RASH Items with * indicate a potential emergency and should be followed up as soon as possible.  Feel free to call the clinic you have any questions or concerns. The clinic phone number is [phone].  Please show the CHEMO ALERT CARD at check-in to the Emergency Department and triage nurse.   

## 2016-06-01 NOTE — Telephone Encounter (Signed)
GAVE PT CAL & AVS °

## 2016-06-02 ENCOUNTER — Ambulatory Visit (HOSPITAL_BASED_OUTPATIENT_CLINIC_OR_DEPARTMENT_OTHER): Payer: Medicare Other

## 2016-06-02 ENCOUNTER — Encounter: Payer: Self-pay | Admitting: *Deleted

## 2016-06-02 VITALS — BP 115/63 | HR 73 | Temp 98.5°F | Resp 18

## 2016-06-02 DIAGNOSIS — Z5111 Encounter for antineoplastic chemotherapy: Secondary | ICD-10-CM | POA: Diagnosis present

## 2016-06-02 DIAGNOSIS — D4622 Refractory anemia with excess of blasts 2: Secondary | ICD-10-CM | POA: Diagnosis not present

## 2016-06-02 MED ORDER — ONDANSETRON HCL 8 MG PO TABS
8.0000 mg | ORAL_TABLET | Freq: Once | ORAL | Status: AC
  Administered 2016-06-02: 8 mg via ORAL

## 2016-06-02 MED ORDER — ONDANSETRON HCL 8 MG PO TABS
ORAL_TABLET | ORAL | Status: AC
  Filled 2016-06-02: qty 1

## 2016-06-02 MED ORDER — AZACITIDINE CHEMO SQ INJECTION
75.7000 mg/m2 | Freq: Once | INTRAMUSCULAR | Status: AC
  Administered 2016-06-02: 145 mg via SUBCUTANEOUS
  Filled 2016-06-02: qty 5.8

## 2016-06-02 NOTE — Patient Instructions (Signed)
Azacitidine suspension for injection (subcutaneous use) What is this medicine? AZACITIDINE (ay za SITE i deen) is a chemotherapy drug. This medicine reduces the growth of cancer cells and can suppress the immune system. It is used for treating myelodysplastic syndrome or some types of leukemia. This medicine may be used for other purposes; ask your health care provider or pharmacist if you have questions. What should I tell my health care provider before I take this medicine? They need to know if you have any of these conditions: -infection (especially a virus infection such as chickenpox, cold sores, or herpes) -kidney disease -liver disease -liver tumors -an unusual or allergic reaction to azacitidine, mannitol, other medicines, foods, dyes, or preservatives -pregnant or trying to get pregnant -breast-feeding How should I use this medicine? This medicine is for injection under the skin. It is administered in a hospital or clinic by a specially trained health care professional. Talk to your pediatrician regarding the use of this medicine in children. While this drug may be prescribed for selected conditions, precautions do apply. Overdosage: If you think you have taken too much of this medicine contact a poison control center or emergency room at once. NOTE: This medicine is only for you. Do not share this medicine with others. What if I miss a dose? It is important not to miss your dose. Call your doctor or health care professional if you are unable to keep an appointment. What may interact with this medicine? Interactions have not been studied. Give your health care provider a list of all the medicines, herbs, non-prescription drugs, or dietary supplements you use. Also tell them if you smoke, drink alcohol, or use illegal drugs. Some items may interact with your medicine. This list may not describe all possible interactions. Give your health care provider a list of all the medicines,  herbs, non-prescription drugs, or dietary supplements you use. Also tell them if you smoke, drink alcohol, or use illegal drugs. Some items may interact with your medicine. What should I watch for while using this medicine? Visit your doctor for checks on your progress. This drug may make you feel generally unwell. This is not uncommon, as chemotherapy can affect healthy cells as well as cancer cells. Report any side effects. Continue your course of treatment even though you feel ill unless your doctor tells you to stop. In some cases, you may be given additional medicines to help with side effects. Follow all directions for their use. Call your doctor or health care professional for advice if you get a fever, chills or sore throat, or other symptoms of a cold or flu. Do not treat yourself. This drug decreases your body's ability to fight infections. Try to avoid being around people who are sick. This medicine may increase your risk to bruise or bleed. Call your doctor or health care professional if you notice any unusual bleeding. Do not have any vaccinations without your doctor's approval and avoid anyone who has recently had oral polio vaccine. Do not become pregnant while taking this medicine. Women should inform their doctor if they wish to become pregnant or think they might be pregnant. There is a potential for serious side effects to an unborn child. Talk to your health care professional or pharmacist for more information. Do not breast-feed an infant while taking this medicine. If you are a man, you should not father a child while receiving treatment. What side effects may I notice from receiving this medicine? Side effects that you should report   to your doctor or health care professional as soon as possible: -allergic reactions like skin rash, itching or hives, swelling of the face, lips, or tongue -low blood counts - this medicine may decrease the number of white blood cells, red blood cells  and platelets. You may be at increased risk for infections and bleeding. -signs of infection - fever or chills, cough, sore throat, pain or difficulty passing urine -signs of decreased platelets or bleeding - bruising, pinpoint red spots on the skin, black, tarry stools, blood in the urine -signs of decreased red blood cells - unusually weak or tired, fainting spells, lightheadedness -reactions at the injection site including redness, pain, itching, or bruising -breathing problems -changes in vision -fever -mouth sores -stomach pain -vomiting Side effects that usually do not require medical attention (report to your doctor or health care professional if they continue or are bothersome): -constipation -diarrhea -loss of appetite -nausea -pain or redness at the injection site -weak or tired This list may not describe all possible side effects. Call your doctor for medical advice about side effects. You may report side effects to FDA at 1-800-FDA-1088. Where should I keep my medicine? This drug is given in a hospital or clinic and will not be stored at home. NOTE: This sheet is a summary. It may not cover all possible information. If you have questions about this medicine, talk to your doctor, pharmacist, or health care provider.    2016, Elsevier/Gold Standard. (2014-08-08 18:13:53)  

## 2016-06-03 ENCOUNTER — Ambulatory Visit (HOSPITAL_BASED_OUTPATIENT_CLINIC_OR_DEPARTMENT_OTHER): Payer: Medicare Other

## 2016-06-03 VITALS — BP 128/58 | HR 75 | Temp 98.6°F | Resp 18

## 2016-06-03 DIAGNOSIS — Z5111 Encounter for antineoplastic chemotherapy: Secondary | ICD-10-CM

## 2016-06-03 DIAGNOSIS — D4622 Refractory anemia with excess of blasts 2: Secondary | ICD-10-CM | POA: Diagnosis not present

## 2016-06-03 MED ORDER — AZACITIDINE CHEMO SQ INJECTION
76.0000 mg/m2 | Freq: Once | INTRAMUSCULAR | Status: AC
  Administered 2016-06-03: 145 mg via SUBCUTANEOUS
  Filled 2016-06-03: qty 5.8

## 2016-06-03 MED ORDER — ONDANSETRON HCL 8 MG PO TABS
8.0000 mg | ORAL_TABLET | Freq: Once | ORAL | Status: AC
  Administered 2016-06-03: 8 mg via ORAL

## 2016-06-03 MED ORDER — ONDANSETRON HCL 8 MG PO TABS
ORAL_TABLET | ORAL | Status: AC
  Filled 2016-06-03: qty 1

## 2016-06-03 NOTE — Patient Instructions (Signed)
Azacitidine suspension for injection (subcutaneous use) What is this medicine? AZACITIDINE (ay za SITE i deen) is a chemotherapy drug. This medicine reduces the growth of cancer cells and can suppress the immune system. It is used for treating myelodysplastic syndrome or some types of leukemia. This medicine may be used for other purposes; ask your health care provider or pharmacist if you have questions. What should I tell my health care provider before I take this medicine? They need to know if you have any of these conditions: -infection (especially a virus infection such as chickenpox, cold sores, or herpes) -kidney disease -liver disease -liver tumors -an unusual or allergic reaction to azacitidine, mannitol, other medicines, foods, dyes, or preservatives -pregnant or trying to get pregnant -breast-feeding How should I use this medicine? This medicine is for injection under the skin. It is administered in a hospital or clinic by a specially trained health care professional. Talk to your pediatrician regarding the use of this medicine in children. While this drug may be prescribed for selected conditions, precautions do apply. Overdosage: If you think you have taken too much of this medicine contact a poison control center or emergency room at once. NOTE: This medicine is only for you. Do not share this medicine with others. What if I miss a dose? It is important not to miss your dose. Call your doctor or health care professional if you are unable to keep an appointment. What may interact with this medicine? Interactions have not been studied. Give your health care provider a list of all the medicines, herbs, non-prescription drugs, or dietary supplements you use. Also tell them if you smoke, drink alcohol, or use illegal drugs. Some items may interact with your medicine. This list may not describe all possible interactions. Give your health care provider a list of all the medicines,  herbs, non-prescription drugs, or dietary supplements you use. Also tell them if you smoke, drink alcohol, or use illegal drugs. Some items may interact with your medicine. What should I watch for while using this medicine? Visit your doctor for checks on your progress. This drug may make you feel generally unwell. This is not uncommon, as chemotherapy can affect healthy cells as well as cancer cells. Report any side effects. Continue your course of treatment even though you feel ill unless your doctor tells you to stop. In some cases, you may be given additional medicines to help with side effects. Follow all directions for their use. Call your doctor or health care professional for advice if you get a fever, chills or sore throat, or other symptoms of a cold or flu. Do not treat yourself. This drug decreases your body's ability to fight infections. Try to avoid being around people who are sick. This medicine may increase your risk to bruise or bleed. Call your doctor or health care professional if you notice any unusual bleeding. Do not have any vaccinations without your doctor's approval and avoid anyone who has recently had oral polio vaccine. Do not become pregnant while taking this medicine. Women should inform their doctor if they wish to become pregnant or think they might be pregnant. There is a potential for serious side effects to an unborn child. Talk to your health care professional or pharmacist for more information. Do not breast-feed an infant while taking this medicine. If you are a man, you should not father a child while receiving treatment. What side effects may I notice from receiving this medicine? Side effects that you should report   to your doctor or health care professional as soon as possible: -allergic reactions like skin rash, itching or hives, swelling of the face, lips, or tongue -low blood counts - this medicine may decrease the number of white blood cells, red blood cells  and platelets. You may be at increased risk for infections and bleeding. -signs of infection - fever or chills, cough, sore throat, pain or difficulty passing urine -signs of decreased platelets or bleeding - bruising, pinpoint red spots on the skin, black, tarry stools, blood in the urine -signs of decreased red blood cells - unusually weak or tired, fainting spells, lightheadedness -reactions at the injection site including redness, pain, itching, or bruising -breathing problems -changes in vision -fever -mouth sores -stomach pain -vomiting Side effects that usually do not require medical attention (report to your doctor or health care professional if they continue or are bothersome): -constipation -diarrhea -loss of appetite -nausea -pain or redness at the injection site -weak or tired This list may not describe all possible side effects. Call your doctor for medical advice about side effects. You may report side effects to FDA at 1-800-FDA-1088. Where should I keep my medicine? This drug is given in a hospital or clinic and will not be stored at home. NOTE: This sheet is a summary. It may not cover all possible information. If you have questions about this medicine, talk to your doctor, pharmacist, or health care provider.    2016, Elsevier/Gold Standard. (2014-08-08 18:13:53)  

## 2016-06-06 ENCOUNTER — Encounter: Payer: Self-pay | Admitting: *Deleted

## 2016-06-06 ENCOUNTER — Ambulatory Visit (HOSPITAL_BASED_OUTPATIENT_CLINIC_OR_DEPARTMENT_OTHER): Payer: Medicare Other

## 2016-06-06 VITALS — BP 100/53 | HR 72 | Temp 98.3°F | Resp 18

## 2016-06-06 DIAGNOSIS — D4622 Refractory anemia with excess of blasts 2: Secondary | ICD-10-CM | POA: Diagnosis not present

## 2016-06-06 DIAGNOSIS — Z5111 Encounter for antineoplastic chemotherapy: Secondary | ICD-10-CM | POA: Diagnosis present

## 2016-06-06 MED ORDER — SODIUM CHLORIDE 0.9 % IV SOLN
Freq: Once | INTRAVENOUS | Status: AC
  Administered 2016-06-06: 14:00:00 via INTRAVENOUS

## 2016-06-06 MED ORDER — AZACITIDINE CHEMO SQ INJECTION
76.0000 mg/m2 | Freq: Once | INTRAMUSCULAR | Status: AC
  Administered 2016-06-06: 145 mg via SUBCUTANEOUS
  Filled 2016-06-06: qty 5.8

## 2016-06-06 MED ORDER — ONDANSETRON HCL 8 MG PO TABS
8.0000 mg | ORAL_TABLET | Freq: Once | ORAL | Status: AC
  Administered 2016-06-06: 8 mg via ORAL

## 2016-06-06 MED ORDER — ONDANSETRON HCL 8 MG PO TABS
ORAL_TABLET | ORAL | Status: AC
  Filled 2016-06-06: qty 1

## 2016-06-06 NOTE — Patient Instructions (Signed)
[facility] Discharge Instructions for Patients Receiving Chemotherapy  Today you received the following chemotherapy agents Vidaza  To help prevent nausea and vomiting after your treatment, we encourage you to take your nausea medication as prescribed.    If you develop nausea and vomiting that is not controlled by your nausea medication, call the clinic.   BELOW ARE SYMPTOMS THAT SHOULD BE REPORTED IMMEDIATELY:  *FEVER GREATER THAN 100.5 F  *CHILLS WITH OR WITHOUT FEVER  NAUSEA AND VOMITING THAT IS NOT CONTROLLED WITH YOUR NAUSEA MEDICATION  *UNUSUAL SHORTNESS OF BREATH  *UNUSUAL BRUISING OR BLEEDING  TENDERNESS IN MOUTH AND THROAT WITH OR WITHOUT PRESENCE OF ULCERS  *URINARY PROBLEMS  *BOWEL PROBLEMS  UNUSUAL RASH Items with * indicate a potential emergency and should be followed up as soon as possible.  Feel free to call the clinic you have any questions or concerns. The clinic phone number is [phone].  Please show the CHEMO ALERT CARD at check-in to the Emergency Department and triage nurse.   

## 2016-06-06 NOTE — Progress Notes (Signed)
Ok to treat per MD Irene Limbo. Give 1 Liter of NS

## 2016-06-07 ENCOUNTER — Ambulatory Visit: Payer: Medicare Other

## 2016-06-07 ENCOUNTER — Encounter (HOSPITAL_COMMUNITY): Payer: Self-pay

## 2016-06-07 DIAGNOSIS — Z79899 Other long term (current) drug therapy: Secondary | ICD-10-CM | POA: Diagnosis not present

## 2016-06-07 DIAGNOSIS — D46Z Other myelodysplastic syndromes: Secondary | ICD-10-CM | POA: Diagnosis not present

## 2016-06-07 DIAGNOSIS — Z6826 Body mass index (BMI) 26.0-26.9, adult: Secondary | ICD-10-CM | POA: Diagnosis not present

## 2016-06-07 DIAGNOSIS — Z005 Encounter for examination of potential donor of organ and tissue: Secondary | ICD-10-CM | POA: Diagnosis not present

## 2016-06-08 ENCOUNTER — Ambulatory Visit (HOSPITAL_BASED_OUTPATIENT_CLINIC_OR_DEPARTMENT_OTHER): Payer: Medicare Other

## 2016-06-08 ENCOUNTER — Other Ambulatory Visit (HOSPITAL_BASED_OUTPATIENT_CLINIC_OR_DEPARTMENT_OTHER): Payer: Medicare Other

## 2016-06-08 DIAGNOSIS — D4622 Refractory anemia with excess of blasts 2: Secondary | ICD-10-CM

## 2016-06-08 DIAGNOSIS — Z5111 Encounter for antineoplastic chemotherapy: Secondary | ICD-10-CM

## 2016-06-08 LAB — CBC & DIFF AND RETIC
BASO%: 0 % (ref 0.0–2.0)
BASOS ABS: 0 10*3/uL (ref 0.0–0.1)
EOS%: 0.5 % (ref 0.0–7.0)
Eosinophils Absolute: 0 10*3/uL (ref 0.0–0.5)
HCT: 28.8 % — ABNORMAL LOW (ref 38.4–49.9)
HEMOGLOBIN: 8.2 g/dL — AB (ref 13.0–17.1)
IMMATURE RETIC FRACT: 39.1 % — AB (ref 3.00–10.60)
LYMPH#: 1.4 10*3/uL (ref 0.9–3.3)
LYMPH%: 69.2 % — AB (ref 14.0–49.0)
MCH: 28.5 pg (ref 27.2–33.4)
MCHC: 28.5 g/dL — ABNORMAL LOW (ref 32.0–36.0)
MCV: 100 fL — ABNORMAL HIGH (ref 79.3–98.0)
MONO#: 0.1 10*3/uL (ref 0.1–0.9)
MONO%: 3.6 % (ref 0.0–14.0)
NEUT#: 0.5 10*3/uL — CL (ref 1.5–6.5)
NEUT%: 26.7 % — AB (ref 39.0–75.0)
Platelets: 257 10*3/uL (ref 140–400)
RBC: 2.88 10*6/uL — ABNORMAL LOW (ref 4.20–5.82)
RDW: 19.1 % — ABNORMAL HIGH (ref 11.0–14.6)
RETIC %: 4.51 % — AB (ref 0.80–1.80)
RETIC CT ABS: 129.89 10*3/uL — AB (ref 34.80–93.90)
WBC: 2 10*3/uL — ABNORMAL LOW (ref 4.0–10.3)
nRBC: 2 % — ABNORMAL HIGH (ref 0–0)

## 2016-06-08 MED ORDER — AZACITIDINE CHEMO SQ INJECTION
76.0000 mg/m2 | Freq: Once | INTRAMUSCULAR | Status: AC
  Administered 2016-06-08: 145 mg via SUBCUTANEOUS
  Filled 2016-06-08: qty 5.8

## 2016-06-08 MED ORDER — ONDANSETRON HCL 8 MG PO TABS
ORAL_TABLET | ORAL | Status: AC
  Filled 2016-06-08: qty 1

## 2016-06-08 MED ORDER — ONDANSETRON HCL 8 MG PO TABS
8.0000 mg | ORAL_TABLET | Freq: Once | ORAL | Status: AC
  Administered 2016-06-08: 8 mg via ORAL

## 2016-06-09 ENCOUNTER — Ambulatory Visit (HOSPITAL_BASED_OUTPATIENT_CLINIC_OR_DEPARTMENT_OTHER): Payer: Medicare Other

## 2016-06-09 VITALS — BP 113/56 | HR 85 | Temp 98.3°F | Resp 18

## 2016-06-09 DIAGNOSIS — D4622 Refractory anemia with excess of blasts 2: Secondary | ICD-10-CM | POA: Diagnosis not present

## 2016-06-09 DIAGNOSIS — Z5111 Encounter for antineoplastic chemotherapy: Secondary | ICD-10-CM

## 2016-06-09 MED ORDER — AZACITIDINE CHEMO SQ INJECTION
76.0000 mg/m2 | Freq: Once | INTRAMUSCULAR | Status: AC
  Administered 2016-06-09: 145 mg via SUBCUTANEOUS
  Filled 2016-06-09: qty 5.8

## 2016-06-09 MED ORDER — ONDANSETRON HCL 8 MG PO TABS
8.0000 mg | ORAL_TABLET | Freq: Once | ORAL | Status: AC
  Administered 2016-06-09: 8 mg via ORAL

## 2016-06-09 MED ORDER — ONDANSETRON HCL 8 MG PO TABS
ORAL_TABLET | ORAL | Status: AC
  Filled 2016-06-09: qty 1

## 2016-06-09 NOTE — Patient Instructions (Signed)
[place] Discharge Instructions for Patients Receiving Chemotherapy  Today you received the following chemotherapy agents :  Azacitidine.  To help prevent nausea and vomiting after your treatment, we encourage you to take your nausea medication as prescribed.   If you develop nausea and vomiting that is not controlled by your nausea medication, call the clinic.   BELOW ARE SYMPTOMS THAT SHOULD BE REPORTED IMMEDIATELY:  *FEVER GREATER THAN 100.5 F  *CHILLS WITH OR WITHOUT FEVER  NAUSEA AND VOMITING THAT IS NOT CONTROLLED WITH YOUR NAUSEA MEDICATION  *UNUSUAL SHORTNESS OF BREATH  *UNUSUAL BRUISING OR BLEEDING  TENDERNESS IN MOUTH AND THROAT WITH OR WITHOUT PRESENCE OF ULCERS  *URINARY PROBLEMS  *BOWEL PROBLEMS  UNUSUAL RASH Items with * indicate a potential emergency and should be followed up as soon as possible.  Feel free to call the clinic you have any questions or concerns. The clinic phone number is [phone].  Please show the [place] at check-in to the Emergency Department and triage nurse.

## 2016-06-13 ENCOUNTER — Other Ambulatory Visit: Payer: Self-pay | Admitting: *Deleted

## 2016-06-14 ENCOUNTER — Other Ambulatory Visit: Payer: Self-pay | Admitting: Hematology

## 2016-06-14 MED ORDER — FLUCONAZOLE 200 MG PO TABS: 400.0000 mg | ORAL_TABLET | Freq: Every day | ORAL | Status: DC

## 2016-06-15 ENCOUNTER — Telehealth: Payer: Self-pay | Admitting: Hematology

## 2016-06-15 DIAGNOSIS — Z9481 Bone marrow transplant status: Secondary | ICD-10-CM | POA: Diagnosis not present

## 2016-06-15 DIAGNOSIS — R5383 Other fatigue: Secondary | ICD-10-CM | POA: Diagnosis not present

## 2016-06-15 DIAGNOSIS — T451X5A Adverse effect of antineoplastic and immunosuppressive drugs, initial encounter: Secondary | ICD-10-CM | POA: Diagnosis not present

## 2016-06-15 DIAGNOSIS — Z9221 Personal history of antineoplastic chemotherapy: Secondary | ICD-10-CM | POA: Diagnosis not present

## 2016-06-15 DIAGNOSIS — I517 Cardiomegaly: Secondary | ICD-10-CM | POA: Diagnosis not present

## 2016-06-15 DIAGNOSIS — Z7682 Awaiting organ transplant status: Secondary | ICD-10-CM | POA: Diagnosis not present

## 2016-06-15 DIAGNOSIS — D46Z Other myelodysplastic syndromes: Secondary | ICD-10-CM | POA: Diagnosis not present

## 2016-06-15 DIAGNOSIS — D701 Agranulocytosis secondary to cancer chemotherapy: Secondary | ICD-10-CM | POA: Diagnosis not present

## 2016-06-15 DIAGNOSIS — I10 Essential (primary) hypertension: Secondary | ICD-10-CM | POA: Diagnosis not present

## 2016-06-15 DIAGNOSIS — Z6826 Body mass index (BMI) 26.0-26.9, adult: Secondary | ICD-10-CM | POA: Diagnosis not present

## 2016-06-15 DIAGNOSIS — R0600 Dyspnea, unspecified: Secondary | ICD-10-CM | POA: Diagnosis not present

## 2016-06-15 DIAGNOSIS — Z005 Encounter for examination of potential donor of organ and tissue: Secondary | ICD-10-CM | POA: Diagnosis not present

## 2016-06-15 DIAGNOSIS — M4185 Other forms of scoliosis, thoracolumbar region: Secondary | ICD-10-CM | POA: Diagnosis not present

## 2016-06-15 DIAGNOSIS — D469 Myelodysplastic syndrome, unspecified: Secondary | ICD-10-CM | POA: Diagnosis not present

## 2016-06-15 NOTE — Telephone Encounter (Signed)
Called patient to confirm appointment. Left message. Appointment letter and schedule mailed. Maria F. °

## 2016-06-20 NOTE — Progress Notes (Signed)
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 Trisomy 8 (Intermediate risk) on FISH studies. R-ISS ( score 7- very high risk)  INTERVAL HISTORY  [name] is here for his scheduled f/u in 2 week follow-up. He reports no fevers or chills. Reports that he shall be meeting the transplant team at [facility] on 06/08/2016.  He reports that the co-pay on posaconazole was very high and was wondering about alternative antifungal prophylaxis.   We discussed fluconazole as a choice and suggested he talk to his transplant team and ccheck their recommendations.has not required any PRBC transfusions since his last visit.    MEDICAL HISTORY:  Past Medical History:  Diagnosis Date  . Cancer [facility])    Prostate, Melanoma- lt. Shoulder (no further problems since 2010  . Complication of anesthesia    versed gave the adverse reaction pt. ,became aggitated  . COPD (chronic obstructive pulmonary disease) (HCC)    Dr. Annamaria Boots follows  . Diffuse esophageal spasm   . Diverticulosis   . DJD (degenerative joint disease)   . Multiple trauma     horse accident 2003 L SHOULDER  . Oral herpes   . OSA (obstructive sleep apnea)    mild, rare cpap use  . Peripheral neuropathy ([place])   . Pulmonary nodule   . Spinal stenosis   . Systolic hypertension   Recurrent HSV related cold sores. Acoustic neuroma left 1999 - s/p surgery. Pruritus ani Injury in 2003 related to a horse accident resulting in hydrothorax, pneumothorax, fractured ribs, dislocated left shoulder, rotator cuff tear on the left, left elbow damage and left ulnar nerve damage.   SURGICAL HISTORY: Past Surgical History:  Procedure Laterality Date  . ,laceration to head in accident with hourse    . ACOUSTIC NEUROMA LEFT EAR  1999  . BALLOON DILATION  N/A 11/25/2014   Procedure: BALLOON DILATION;  Surgeon: Garlan Fair, MD;  Location: [facility];  Service: Endoscopy;  Laterality: N/A;  . COLONOSCOPY WITH PROPOFOL N/A 10/01/2013   Procedure: COLONOSCOPY WITH PROPOFOL;  Surgeon: Garlan Fair, MD;  Location: [facility];  Service: Endoscopy;  Laterality: N/A;  . ESOPHAGOGASTRODUODENOSCOPY (EGD) WITH PROPOFOL N/A 11/25/2014   Procedure: ESOPHAGOGASTRODUODENOSCOPY (EGD) WITH PROPOFOL;  Surgeon: Garlan Fair, MD;  Location: [facility];  Service: Endoscopy;  Laterality: N/A;  . EYE SURGERY     cosmetic surgery"brow lift" 4'15  . MULTIPLE PROCEDURES FOR LEFT ARM     ELBOW,RIB FX PNEUMOTHORAX AFTER FALLING OFF MOUNTAIN  . PILONIDAL CYST / SINUS EXCISION    . RADICAL PROSTATECTOMY      SOCIAL HISTORY: Social History   Social History  . Marital status: Married    Spouse name: N/A  . Number of children: N/A  . Years of education: N/A   Occupational History  . Not on file.   Social History Main Topics  . Smoking status: Former Smoker    Packs/day: 1.00    Years: 40.00    Quit date: 11/29/1995  . Smokeless tobacco: Never Used  . Alcohol use Yes     Comment: 1 drink per day  . Drug use: No  . Sexual activity: Not on file   Other Topics Concern  . Not on file   Social History Narrative   CPAP 8 advanced  Patient drinks 5 cups of caffeine daily   Exercises 3-4 times weekly   Son is a pulmonologist    FAMILY HISTORY: Family History  Problem Relation Age of Onset  . Emphysema Father   . Asthma Father   . Lung cancer Father   . Breast cancer Sister   . Breast cancer Sister   . Skin cancer Brother   . Prostate cancer Brother    Brother with a recent diagnosis of hemachromatosis Brother with CLL/SLL Mother had anemia, thalassemia. Hepatitis C with liver cancer  ALLERGIES:  is allergic to midazolam.  MEDICATIONS:  Current Outpatient Prescriptions  Medication Sig Dispense Refill  . aspirin 81 MG tablet  Take 81 mg by mouth at bedtime.     . calcium carbonate (TUMS - DOSED IN MG ELEMENTAL CALCIUM) 500 MG chewable tablet Chew 1-2 tablets by mouth daily as needed for indigestion or heartburn.    . chlorhexidine (PERIDEX) 0.12 % solution Use as directed 10 mLs in the mouth or throat 2 (two) times daily. Swish and spit. Do not swallow. 473 mL 1  . levofloxacin (LEVAQUIN) 500 MG tablet Take 1 tablet (500 mg total) by mouth daily. 30 tablet 1  . nitroGLYCERIN (NITROSTAT) 0.4 MG SL tablet Place 1 tablet (0.4 mg total) under the tongue every 5 (five) minutes as needed for chest pain. 25 tablet 1  . omeprazole (PRILOSEC) 40 MG capsule Take 40 mg by mouth daily before lunch. 1/2 hour before.    . Probiotic Product (PROBIOTIC PO) Take 1 tablet by mouth at bedtime. Reported on 03/09/2016    . senna-docusate (SENNA S) 8.6-50 MG tablet Take 2 tablets by mouth 2 (two) times daily. 120 tablet 2  . valACYclovir (VALTREX) 500 MG tablet Take 250 mg by mouth every other day. At night to suppress herpes gingivitis    . valsartan (DIOVAN) 80 MG tablet Take 80 mg by mouth at bedtime.    . fluconazole (DIFLUCAN) 200 MG tablet Take 2 tablets (400 mg total) by mouth daily. 30 tablet 1   No current facility-administered medications for this visit.     REVIEW OF SYSTEMS:    10 Point review of Systems was done is negative except as noted above.  PHYSICAL EXAMINATION: ECOG PERFORMANCE STATUS: 1 - Symptomatic but completely ambulatory  . Vitals:   06/01/16 1237  BP: 138/61  Pulse: 83  Resp: 18  Temp: 98.6 F (37 C)   Filed Weights   06/01/16 1237  Weight: 169 lb 4.8 oz (76.8 kg)   .Body mass index is 26.52 kg/m.  GENERAL:alert, in no acute distress and comfortable SKIN: skin color, texture, turgor are normal, no rashes or significant lesions EYES: normal, conjunctiva are pink and non-injected, sclera clear OROPHARYNX:no exudate, no erythema and lips, buccal mucosa, and tongue normal  NECK: supple, no JVD,  thyroid normal size, non-tender, without nodularity LYMPH:  no palpable lymphadenopathy in the cervical, axillary or inguinal LUNGS: clear to auscultation With somewhat decreased entry bilaterally, no wheezing HEART: regular rate & rhythm,  no murmurs and no lower extremity edema ABDOMEN: abdomen soft, non-tender, normoactive bowel sounds , no palpable hepatosplenomegaly Musculoskeletal: no cyanosis of digits. PSYCH: alert & oriented x 3 with fluent speech NEURO: no focal motor deficits.  LABORATORY DATA:  I have reviewed the data as listed  . CBC Latest Ref Rng & Units 06/08/2016 06/01/2016 05/23/2016  WBC 4.0 - 10.3 10e3/uL 2.0(L) 1.4(L) 1.4(L)  Hemoglobin 13.0 - 17.1 g/dL 8.2(L) 8.5(L) 8.0(L)  Hematocrit  38.4 - 49.9 % 28.8(L) 30.3(L) 28.8(L)  Platelets 140 - 400 10e3/uL 257 238 202   . CMP Latest Ref Rng & Units 06/01/2016 05/16/2016 05/09/2016  Glucose 70 - 140 mg/dl 94 111 102  BUN 7.0 - 26.0 mg/dL 17.9 23.0 22.0  Creatinine 0.7 - 1.3 mg/dL 1.0 1.1 1.1  Sodium 136 - 145 mEq/L 141 140 139  Potassium 3.5 - 5.1 mEq/L 4.0 4.3 4.9  Chloride 96 - 112 mEq/L - - -  CO2 22 - 29 mEq/L '24 27 27  '$ Calcium 8.4 - 10.4 mg/dL 8.4 8.4 8.7  Total Protein 6.4 - 8.3 g/dL 6.1(L) 6.2(L) 6.2(L)  Total Bilirubin 0.20 - 1.20 mg/dL 1.69(H) 1.65(H) 1.17  Alkaline Phos 40 - 150 U/L 47 47 50  AST 5 - 34 U/L '22 22 25  '$ ALT 0 - 55 U/L '14 16 21     '$ Lab Results  Component Value Date   LDH 208 03/18/2016   . Lab Results  Component Value Date   IRON 79 03/18/2016   TIBC 328 03/18/2016   IRONPCTSAT 24 03/18/2016   (Iron and TIBC)  Lab Results  Component Value Date   FERRITIN 196 05/16/2016   Erythropoietin: 92.6  RADIOGRAPHIC STUDIES: I have personally reviewed the radiological images as listed and agreed with the findings in the report. No results found.  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 Patient has normal platelet counts at this time. Patient has not received any additional PRBC transfusions since his last clinic visit. 2) Anemia - hemoglobin relatively stable in the i8-9 range  . Some symptoms but not significant at this time .remained stable 3) Neutropenia no fevers. No evidence of infection.appears to have improved a little bit on repeat labs on 06/11/2016 4) Slightly elevated bilirubin level even before treatment (?GIlbert's) 5) Mild iron deficiency ferritin 16-22 Received IV Feraheme on 05/02/2016. Iron deficiency corrected  Plan -given fluconazole  Instead posaconazole due to insurance coverage and financial issues. -We will hold his Statin due to interaction with azoles.Burnis Medin plan to treat him for 4-6 cycles and then repeat bone marrow biopsy. -Transfuse PRBCs (CMV neg and irradiated) when necessary for symptomatic anemia or if hemoglobin less than 7. We will use it irradiated and CMV negative products.not indicated at this time -continue neutropenic precautions. -we'll followup on the [facility] one results on his bone marrow biopsy. Continue weekly CBC Return to care with Dr. Irene Limbo and for his 4th cycle of Vidaza on 07/01/2016 with repeat labs .  All of the patients, wife's and son's questions were answered to their apparent satisfaction. The patient knows to call the clinic with any problems, questions or concerns.  I spent 25 minutes counseling the patient face to face. The total time spent in the appointment was 30 minutes and more than 50% was on counseling and direct patient cares.    Sullivan Lone MD [place] AAHIVMS [facility] Hematology/Oncology Physician [facility]  (Office):       [phone] (Work cell):  [phone] (Fax):           [phone]

## 2016-06-22 ENCOUNTER — Other Ambulatory Visit (HOSPITAL_BASED_OUTPATIENT_CLINIC_OR_DEPARTMENT_OTHER): Payer: Medicare Other

## 2016-06-22 ENCOUNTER — Other Ambulatory Visit: Payer: Self-pay | Admitting: *Deleted

## 2016-06-22 DIAGNOSIS — D4622 Refractory anemia with excess of blasts 2: Secondary | ICD-10-CM | POA: Diagnosis present

## 2016-06-22 DIAGNOSIS — D696 Thrombocytopenia, unspecified: Secondary | ICD-10-CM

## 2016-06-22 LAB — COMPREHENSIVE METABOLIC PANEL
ALBUMIN: 3.4 g/dL — AB (ref 3.5–5.0)
ALT: 13 U/L (ref 0–55)
ANION GAP: 8 meq/L (ref 3–11)
AST: 22 U/L (ref 5–34)
Alkaline Phosphatase: 49 U/L (ref 40–150)
BUN: 21.3 mg/dL (ref 7.0–26.0)
CALCIUM: 8.4 mg/dL (ref 8.4–10.4)
CHLORIDE: 108 meq/L (ref 98–109)
CO2: 26 mEq/L (ref 22–29)
CREATININE: 1.2 mg/dL (ref 0.7–1.3)
EGFR: 61 mL/min/{1.73_m2} — ABNORMAL LOW (ref >90)
Glucose: 125 mg/dl (ref 70–140)
POTASSIUM: 4.2 meq/L (ref 3.5–5.1)
Sodium: 141 mEq/L (ref 136–145)
TOTAL PROTEIN: 6.1 g/dL — AB (ref 6.4–8.3)
Total Bilirubin: 1.37 mg/dL — ABNORMAL HIGH (ref 0.20–1.20)

## 2016-06-22 LAB — CBC & DIFF AND RETIC
BASO%: 0 % (ref 0.0–2.0)
BASOS ABS: 0 10*3/uL (ref 0.0–0.1)
EOS ABS: 0 10*3/uL (ref 0.0–0.5)
EOS%: 2 % (ref 0.0–7.0)
HEMATOCRIT: 27.8 % — AB (ref 38.4–49.9)
HEMOGLOBIN: 7.9 g/dL — AB (ref 13.0–17.1)
Immature Retic Fract: 16.6 % — ABNORMAL HIGH (ref 3.00–10.60)
LYMPH#: 0.9 10*3/uL (ref 0.9–3.3)
LYMPH%: 85.3 % — ABNORMAL HIGH (ref 14.0–49.0)
MCH: 28.5 pg (ref 27.2–33.4)
MCHC: 28.4 g/dL — AB (ref 32.0–36.0)
MCV: 100.4 fL — ABNORMAL HIGH (ref 79.3–98.0)
MONO#: 0 10*3/uL — ABNORMAL LOW (ref 0.1–0.9)
MONO%: 1 % (ref 0.0–14.0)
NEUT#: 0.1 10*3/uL — CL (ref 1.5–6.5)
NEUT%: 11.7 % — AB (ref 39.0–75.0)
PLATELETS: 252 10*3/uL (ref 140–400)
RBC: 2.77 10*6/uL — ABNORMAL LOW (ref 4.20–5.82)
RDW: 19.2 % — ABNORMAL HIGH (ref 11.0–14.6)
RETIC %: 3.89 % — AB (ref 0.80–1.80)
RETIC CT ABS: 107.75 10*3/uL — AB (ref 34.80–93.90)
WBC: 1 10*3/uL — ABNORMAL LOW (ref 4.0–10.3)

## 2016-06-23 ENCOUNTER — Ambulatory Visit (HOSPITAL_BASED_OUTPATIENT_CLINIC_OR_DEPARTMENT_OTHER): Payer: Medicare Other | Admitting: Hematology

## 2016-06-23 ENCOUNTER — Other Ambulatory Visit (HOSPITAL_BASED_OUTPATIENT_CLINIC_OR_DEPARTMENT_OTHER): Payer: Medicare Other

## 2016-06-23 ENCOUNTER — Other Ambulatory Visit (HOSPITAL_COMMUNITY)
Admission: RE | Admit: 2016-06-23 | Discharge: 2016-06-23 | Disposition: A | Discharge status: Home / Self Care | Payer: Medicare Other | Source: Ambulatory Visit | Attending: Hematology | Admitting: Hematology

## 2016-06-23 ENCOUNTER — Encounter: Payer: Self-pay | Admitting: Hematology

## 2016-06-23 VITALS — BP 111/58 | HR 84 | Temp 98.4°F | Resp 18 | Ht 67.0 in | Wt 161.7 lb

## 2016-06-23 DIAGNOSIS — J984 Other disorders of lung: Secondary | ICD-10-CM | POA: Insufficient documentation

## 2016-06-23 DIAGNOSIS — D4622 Refractory anemia with excess of blasts 2: Secondary | ICD-10-CM

## 2016-06-23 DIAGNOSIS — R21 Rash and other nonspecific skin eruption: Secondary | ICD-10-CM

## 2016-06-23 DIAGNOSIS — D649 Anemia, unspecified: Secondary | ICD-10-CM

## 2016-06-23 DIAGNOSIS — D72819 Decreased white blood cell count, unspecified: Secondary | ICD-10-CM

## 2016-06-23 DIAGNOSIS — D709 Neutropenia, unspecified: Secondary | ICD-10-CM

## 2016-06-23 DIAGNOSIS — D696 Thrombocytopenia, unspecified: Secondary | ICD-10-CM

## 2016-06-23 LAB — FIBRINOGEN: FIBRINOGEN: 289 mg/dL (ref 210–475)

## 2016-06-23 LAB — COMPREHENSIVE METABOLIC PANEL
ALBUMIN: 3.4 g/dL — AB (ref 3.5–5.0)
ALK PHOS: 48 U/L (ref 40–150)
ALT: 12 U/L (ref 0–55)
ANION GAP: 8 meq/L (ref 3–11)
AST: 20 U/L (ref 5–34)
BUN: 21.2 mg/dL (ref 7.0–26.0)
CALCIUM: 8.4 mg/dL (ref 8.4–10.4)
CHLORIDE: 109 meq/L (ref 98–109)
CO2: 24 mEq/L (ref 22–29)
Creatinine: 1.3 mg/dL (ref 0.7–1.3)
EGFR: 53 mL/min/{1.73_m2} — AB (ref >90)
Glucose: 102 mg/dl (ref 70–140)
POTASSIUM: 4 meq/L (ref 3.5–5.1)
Sodium: 141 mEq/L (ref 136–145)
Total Bilirubin: 1.17 mg/dL (ref 0.20–1.20)
Total Protein: 6.1 g/dL — ABNORMAL LOW (ref 6.4–8.3)

## 2016-06-23 LAB — PROTIME-INR
INR: 1.1 — AB (ref 2.00–3.50)
PROTIME: 13.2 s (ref 10.6–13.4)

## 2016-06-23 LAB — CBC WITH DIFFERENTIAL/PLATELET
BASO%: 0.5 % (ref 0.0–2.0)
Basophils Absolute: 0 10*3/uL (ref 0.0–0.1)
EOS ABS: 0 10*3/uL (ref 0.0–0.5)
EOS%: 3.1 % (ref 0.0–7.0)
HCT: 27.8 % — ABNORMAL LOW (ref 38.4–49.9)
HGB: 7.9 g/dL — ABNORMAL LOW (ref 13.0–17.1)
LYMPH%: 88.9 % — AB (ref 14.0–49.0)
MCH: 28.5 pg (ref 27.2–33.4)
MCHC: 28.6 g/dL — AB (ref 32.0–36.0)
MCV: 99.7 fL — AB (ref 79.3–98.0)
MONO#: 0 10*3/uL — AB (ref 0.1–0.9)
MONO%: 1.2 % (ref 0.0–14.0)
NEUT%: 6.3 % — AB (ref 39.0–75.0)
NEUTROS ABS: 0.1 10*3/uL — AB (ref 1.5–6.5)
PLATELETS: 290 10*3/uL (ref 140–400)
RBC: 2.79 10*6/uL — AB (ref 4.20–5.82)
RDW: 20 % — ABNORMAL HIGH (ref 11.0–14.6)
WBC: 1 10*3/uL — AB (ref 4.0–10.3)
lymph#: 0.9 10*3/uL (ref 0.9–3.3)

## 2016-06-23 NOTE — Progress Notes (Signed)
Warren [name]    HEMATOLOGY/ONCOLOGY CLINIC NOTE  Date of Service: .06/23/2016  Patient Care Team: Georgann Housekeeper, MD as PCP - General (Internal Medicine)  Dr Sherlon Handing ([facility])  CHIEF COMPLAINTS: Follow-up for MDS  DIAGNOSIS RAEB-2 with about 15% blasts. Trisomy 8 (Intermediate risk) on FISH studies. R-ISS ( score 7- very high risk)  INTERVAL HISTORY  Warren [name] is here for an early follow-up. His ANC had improved to 500 and then since adding fluconazole for fungal prophylaxis his ANC has dropped to 100 and he developed a erythematous rash on his forearms right more than left with some dark spots. ? Drug eruption/leukocytoclastic vasculitis from fluconazole. Patient has no fevers or chills or night sweats. Some fatigue which is unchanged. No other evidence of bleeding. Platelet counts are stable.    MEDICAL HISTORY:  Past Medical History:  Diagnosis Date  . Cancer [facility])    Prostate, Melanoma- lt. Shoulder (no further problems since 2010  . Complication of anesthesia    versed gave the adverse reaction pt. ,became aggitated  . COPD (chronic obstructive pulmonary disease) (HCC)    Dr. Maple Hudson follows  . Diffuse esophageal spasm   . Diverticulosis   . DJD (degenerative joint disease)   . Multiple trauma     horse accident 2003 L SHOULDER  . Oral herpes   . OSA (obstructive sleep apnea)    mild, rare cpap use  . Peripheral neuropathy (HCC)   . Pulmonary nodule   . Spinal stenosis   . Systolic hypertension   Recurrent HSV related cold sores. Acoustic neuroma left 1999 - s/p surgery. Pruritus ani Injury in 2003 related to a horse accident resulting in hydrothorax, pneumothorax, fractured ribs, dislocated left shoulder, rotator cuff tear on the left, left elbow damage and left ulnar nerve damage.   SURGICAL HISTORY: Past Surgical History:  Procedure Laterality Date  . ,laceration to head in accident with hourse    . ACOUSTIC NEUROMA LEFT EAR  1999  . BALLOON DILATION N/A  11/25/2014   Procedure: BALLOON DILATION;  Surgeon: Charolett Bumpers, MD;  Location: [facility];  Service: Endoscopy;  Laterality: N/A;  . COLONOSCOPY WITH PROPOFOL N/A 10/01/2013   Procedure: COLONOSCOPY WITH PROPOFOL;  Surgeon: Charolett Bumpers, MD;  Location: [facility];  Service: Endoscopy;  Laterality: N/A;  . ESOPHAGOGASTRODUODENOSCOPY (EGD) WITH PROPOFOL N/A 11/25/2014   Procedure: ESOPHAGOGASTRODUODENOSCOPY (EGD) WITH PROPOFOL;  Surgeon: Charolett Bumpers, MD;  Location: [facility];  Service: Endoscopy;  Laterality: N/A;  . EYE SURGERY     cosmetic surgery"brow lift" 4'15  . MULTIPLE PROCEDURES FOR LEFT ARM     ELBOW,RIB FX PNEUMOTHORAX AFTER FALLING OFF MOUNTAIN  . PILONIDAL CYST / SINUS EXCISION    . RADICAL PROSTATECTOMY      SOCIAL HISTORY: Social History   Social History  . Marital status: Married    Spouse name: N/A  . Number of children: N/A  . Years of education: N/A   Occupational History  . Not on file.   Social History Main Topics  . Smoking status: Former Smoker    Packs/day: 1.00    Years: 40.00    Quit date: 11/29/1995  . Smokeless tobacco: Never Used  . Alcohol use Yes     Comment: 1 drink per day  . Drug use: No  . Sexual activity: Not on file   Other Topics Concern  . Not on file   Social History Narrative   CPAP 8 advanced   Patient drinks 5 cups  of caffeine daily   Exercises 3-4 times weekly   Son is a pulmonologist    FAMILY HISTORY: Family History  Problem Relation Age of Onset  . Emphysema Father   . Asthma Father   . Lung cancer Father   . Breast cancer Sister   . Breast cancer Sister   . Skin cancer Brother   . Prostate cancer Brother    Brother with a recent diagnosis of hemachromatosis Brother with CLL/SLL Mother had anemia, thalassemia. Hepatitis C with liver cancer  ALLERGIES:  is allergic to midazolam.  MEDICATIONS:  Current Outpatient Prescriptions  Medication Sig Dispense Refill  . aspirin 81 MG tablet Take 81  mg by mouth at bedtime.     . calcium carbonate (TUMS - DOSED IN MG ELEMENTAL CALCIUM) 500 MG chewable tablet Chew 1-2 tablets by mouth daily as needed for indigestion or heartburn.    . chlorhexidine (PERIDEX) 0.12 % solution Use as directed 10 mLs in the mouth or throat 2 (two) times daily. Swish and spit. Do not swallow. 473 mL 1  . fluconazole (DIFLUCAN) 200 MG tablet Take 2 tablets (400 mg total) by mouth daily. 30 tablet 1  . levofloxacin (LEVAQUIN) 500 MG tablet Take 1 tablet (500 mg total) by mouth daily. 30 tablet 1  . nitroGLYCERIN (NITROSTAT) 0.4 MG SL tablet Place 1 tablet (0.4 mg total) under the tongue every 5 (five) minutes as needed for chest pain. 25 tablet 1  . omeprazole (PRILOSEC) 40 MG capsule Take 40 mg by mouth daily before lunch. 1/2 hour before.    . Probiotic Product (PROBIOTIC PO) Take 1 tablet by mouth at bedtime. Reported on 03/09/2016    . senna-docusate (SENNA S) 8.6-50 MG tablet Take 2 tablets by mouth 2 (two) times daily. 120 tablet 2  . valACYclovir (VALTREX) 500 MG tablet Take 250 mg by mouth every other day. At night to suppress herpes gingivitis    . valsartan (DIOVAN) 80 MG tablet Take 80 mg by mouth at bedtime.     No current facility-administered medications for this visit.     REVIEW OF SYSTEMS:    10 Point review of Systems was done is negative except as noted above.  PHYSICAL EXAMINATION: ECOG PERFORMANCE STATUS: 1 - Symptomatic but completely ambulatory  . Vitals:   06/23/16 0949  BP: (!) 111/58  Pulse: 84  Resp: 18  Temp: 98.4 F (36.9 C)   Filed Weights   06/23/16 0949  Weight: 161 lb 11.2 oz (73.3 kg)   .Body mass index is 25.33 kg/m.  GENERAL:alert, in no acute distress and comfortable SKIN: skin color, texture, turgor are normal, no rashes or significant lesions EYES: normal, conjunctiva are pink and non-injected, sclera clear OROPHARYNX:no exudate, no erythema and lips, buccal mucosa, and tongue normal  NECK: supple, no JVD,  thyroid normal size, non-tender, without nodularity LYMPH:  no palpable lymphadenopathy in the cervical, axillary or inguinal LUNGS: clear to auscultation With somewhat decreased entry bilaterally, no wheezing HEART: regular rate & rhythm,  no murmurs and no lower extremity edema ABDOMEN: abdomen soft, non-tender, normoactive bowel sounds , no palpable hepatosplenomegaly Musculoskeletal: no cyanosis of digits. PSYCH: alert & oriented x 3 with fluent speech NEURO: no focal motor deficits.  LABORATORY DATA:  I have reviewed the data as listed  . CBC Latest Ref Rng & Units 06/23/2016 06/22/2016 06/08/2016  WBC 4.0 - 10.3 10e3/uL 1.0(L) 1.0(L) 2.0(L)  Hemoglobin 13.0 - 17.1 g/dL 7.9(L) 7.9(L) 8.2(L)  Hematocrit 38.4 - 49.9 %  27.8(L) 27.8(L) 28.8(L)  Platelets 140 - 400 10e3/uL 290 252 257   . CBC    Component Value Date/Time   WBC 1.0 (L) 06/23/2016 0919   WBC 1.6 (L) 03/21/2016 0720   RBC 2.79 (L) 06/23/2016 0919   RBC 2.79 (L) 03/21/2016 0720   HGB 7.9 (L) 06/23/2016 0919   HCT 27.8 (L) 06/23/2016 0919   PLT 290 06/23/2016 0919   MCV 99.7 (H) 06/23/2016 0919   MCH 28.5 06/23/2016 0919   MCH 30.1 03/21/2016 0720   MCHC 28.6 (L) 06/23/2016 0919   MCHC 30.4 03/21/2016 0720   RDW 20.0 (H) 06/23/2016 0919   LYMPHSABS 0.9 06/23/2016 0919   MONOABS 0.0 (L) 06/23/2016 0919   EOSABS 0.0 06/23/2016 0919   BASOSABS 0.0 06/23/2016 0919    . CMP Latest Ref Rng & Units 06/23/2016 06/22/2016 06/01/2016  Glucose 70 - 140 mg/dl 102 125 94  BUN 7.0 - 26.0 mg/dL 21.2 21.3 17.9  Creatinine 0.7 - 1.3 mg/dL 1.3 1.2 1.0  Sodium 136 - 145 mEq/L 141 141 141  Potassium 3.5 - 5.1 mEq/L 4.0 4.2 4.0  Chloride 96 - 112 mEq/L - - -  CO2 22 - 29 mEq/L '24 26 24  '$ Calcium 8.4 - 10.4 mg/dL 8.4 8.4 8.4  Total Protein 6.4 - 8.3 g/dL 6.1(L) 6.1(L) 6.1(L)  Total Bilirubin 0.20 - 1.20 mg/dL 1.17 1.37(H) 1.69(H)  Alkaline Phos 40 - 150 U/L 48 49 47  AST 5 - 34 U/L '20 22 22  '$ ALT 0 - 55 U/L '12 13 14    '$ Component     Latest Ref Rng & Units 06/23/2016  Protime     10.6 - 13.4 Seconds 13.2  INR     2.00 - 3.50 1.10 (L)  Lovenox      No  Fibrinogen     210 - 475 mg/dL 289    Lab Results  Component Value Date   LDH 208 03/18/2016   . Lab Results  Component Value Date   IRON 79 03/18/2016   TIBC 328 03/18/2016   IRONPCTSAT 24 03/18/2016   (Iron and TIBC)  Lab Results  Component Value Date   FERRITIN 196 05/16/2016   Erythropoietin: 92.6  RADIOGRAPHIC STUDIES: I have personally reviewed the radiological images as listed and agreed with the findings in the report. No results found.  ASSESSMENT & PLAN:   75 year old Caucasian male with  1) myelodysplastic syndrome (RAEB-2 with about 14-15% blasts) ISS-R score of 7 (Very High Risk) Cytogenetics show trisomy 8 (intermediate risk ) Presented with Subacute Anemia (with high normal MCV) + leukopenia/Neutropenia - new since September 2016. Patient has normal platelet counts at this time. Patient has not received any additional PRBC transfusions since his last clinic visit. 2) Anemia - hemoglobin relatively stable in the 8-9 range  . Some symptoms but not significant at this time .remained stable. Patient does not feel he needs a PRBC transfusion at this time.  3) Neutropenia no fevers. No evidence of infection. [place] appeared to be improving to 500 prior to starting the fluconazole and then the patient's [place] appears to drop back down to 100 and he seems to have developed somewhat erythematous bruising like appearing rash with a few darker spots - this could possibly be due to his fluconazole and that is the only new temporally related medication and is more likely than Levaquin to cause neutropenia.  4) Slightly elevated bilirubin level even before treatment (?GIlbert's) - normal today 5) Mild iron  deficiency ferritin 16-22 Received IV Feraheme on 05/02/2016. Iron deficiency corrected  Plan -We discussed that his worsening  neutropenia could be related to fluconazole and so could be his rash.  after discussing pros and cons we decided to discontinue his fluconazole at this time and monitor his counts. -We discussed the need for strict adherence to neutropenic precautions -Transfuse PRBCs (CMV neg and irradiated) when necessary for symptomatic anemia or if hemoglobin less than 7. We will use it irradiated and CMV negative products only. -continue neutropenic precautions. -we'll followup on the [facility] one results on his bone marrow biopsy. -Continue weekly CBC Return to care with Dr. Irene Limbo and for his 4th cycle of Vidaza on 07/04/2016 with repeat labs . -We will plan to do a bone marrow biopsy after his fourth cycle of Vidaza. -Patient intends to follow up with [facility] for his bone marrow transplant needs and did have an initial meeting with the transplant team.  All of the patients, wife's and son's questions were answered to their apparent satisfaction. The patient knows to call the clinic with any problems, questions or concerns.  I spent 20 minutes counseling the patient face to face. The total time spent in the appointment was 20 minutes and more than 50% was on counseling and direct patient cares.    Sullivan Lone MD [place] AAHIVMS [facility] Hematology/Oncology Physician [facility]  (Office):       [phone] (Work cell):  [phone] (Fax):           [phone]

## 2016-06-29 ENCOUNTER — Other Ambulatory Visit: Payer: Medicare Other

## 2016-06-29 ENCOUNTER — Other Ambulatory Visit (HOSPITAL_BASED_OUTPATIENT_CLINIC_OR_DEPARTMENT_OTHER): Payer: Medicare Other

## 2016-06-29 ENCOUNTER — Ambulatory Visit: Payer: Medicare Other | Admitting: Hematology

## 2016-06-29 DIAGNOSIS — D4622 Refractory anemia with excess of blasts 2: Secondary | ICD-10-CM | POA: Diagnosis present

## 2016-06-29 DIAGNOSIS — D696 Thrombocytopenia, unspecified: Secondary | ICD-10-CM

## 2016-06-29 LAB — CBC & DIFF AND RETIC
BASO%: 0 % (ref 0.0–2.0)
Basophils Absolute: 0 10*3/uL (ref 0.0–0.1)
EOS ABS: 0 10*3/uL (ref 0.0–0.5)
EOS%: 1.9 % (ref 0.0–7.0)
HCT: 27 % — ABNORMAL LOW (ref 38.4–49.9)
HEMOGLOBIN: 7.6 g/dL — AB (ref 13.0–17.1)
Immature Retic Fract: 24.4 % — ABNORMAL HIGH (ref 3.00–10.60)
LYMPH%: 87.7 % — AB (ref 14.0–49.0)
MCH: 28 pg (ref 27.2–33.4)
MCHC: 28.1 g/dL — AB (ref 32.0–36.0)
MCV: 99.6 fL — AB (ref 79.3–98.0)
MONO#: 0 10*3/uL — ABNORMAL LOW (ref 0.1–0.9)
MONO%: 1.9 % (ref 0.0–14.0)
NEUT%: 8.5 % — ABNORMAL LOW (ref 39.0–75.0)
NEUTROS ABS: 0.1 10*3/uL — AB (ref 1.5–6.5)
Platelets: 228 10*3/uL (ref 140–400)
RBC: 2.71 10*6/uL — ABNORMAL LOW (ref 4.20–5.82)
RDW: 18.8 % — AB (ref 11.0–14.6)
RETIC %: 3.71 % — AB (ref 0.80–1.80)
Retic Ct Abs: 100.54 10*3/uL — ABNORMAL HIGH (ref 34.80–93.90)
WBC: 1.1 10*3/uL — AB (ref 4.0–10.3)
lymph#: 0.9 10*3/uL (ref 0.9–3.3)

## 2016-06-29 LAB — COMPREHENSIVE METABOLIC PANEL
ALBUMIN: 3.3 g/dL — AB (ref 3.5–5.0)
ALK PHOS: 47 U/L (ref 40–150)
ALT: 13 U/L (ref 0–55)
AST: 19 U/L (ref 5–34)
Anion Gap: 5 mEq/L (ref 3–11)
BUN: 22.4 mg/dL (ref 7.0–26.0)
CHLORIDE: 109 meq/L (ref 98–109)
CO2: 27 mEq/L (ref 22–29)
Calcium: 8.5 mg/dL (ref 8.4–10.4)
Creatinine: 1.1 mg/dL (ref 0.7–1.3)
EGFR: 62 mL/min/{1.73_m2} — AB (ref >90)
GLUCOSE: 93 mg/dL (ref 70–140)
POTASSIUM: 4.4 meq/L (ref 3.5–5.1)
SODIUM: 141 meq/L (ref 136–145)
Total Bilirubin: 1.08 mg/dL (ref 0.20–1.20)
Total Protein: 5.9 g/dL — ABNORMAL LOW (ref 6.4–8.3)

## 2016-06-29 LAB — CHCC SMEAR

## 2016-07-04 ENCOUNTER — Ambulatory Visit (HOSPITAL_BASED_OUTPATIENT_CLINIC_OR_DEPARTMENT_OTHER): Payer: Medicare Other | Admitting: Hematology

## 2016-07-04 ENCOUNTER — Encounter: Payer: Self-pay | Admitting: Hematology

## 2016-07-04 ENCOUNTER — Other Ambulatory Visit (HOSPITAL_BASED_OUTPATIENT_CLINIC_OR_DEPARTMENT_OTHER): Payer: Medicare Other

## 2016-07-04 ENCOUNTER — Ambulatory Visit (HOSPITAL_BASED_OUTPATIENT_CLINIC_OR_DEPARTMENT_OTHER): Payer: Medicare Other

## 2016-07-04 VITALS — BP 127/57 | HR 76 | Temp 98.1°F | Resp 18 | Ht 67.0 in | Wt 168.0 lb

## 2016-07-04 DIAGNOSIS — D4622 Refractory anemia with excess of blasts 2: Secondary | ICD-10-CM | POA: Diagnosis not present

## 2016-07-04 DIAGNOSIS — D649 Anemia, unspecified: Secondary | ICD-10-CM | POA: Diagnosis not present

## 2016-07-04 DIAGNOSIS — D72819 Decreased white blood cell count, unspecified: Secondary | ICD-10-CM | POA: Diagnosis not present

## 2016-07-04 DIAGNOSIS — D709 Neutropenia, unspecified: Secondary | ICD-10-CM

## 2016-07-04 DIAGNOSIS — E611 Iron deficiency: Secondary | ICD-10-CM | POA: Diagnosis not present

## 2016-07-04 DIAGNOSIS — Z5111 Encounter for antineoplastic chemotherapy: Secondary | ICD-10-CM

## 2016-07-04 LAB — CBC & DIFF AND RETIC
BASO%: 0 % (ref 0.0–2.0)
Basophils Absolute: 0 10*3/uL (ref 0.0–0.1)
EOS%: 0.7 % (ref 0.0–7.0)
Eosinophils Absolute: 0 10*3/uL (ref 0.0–0.5)
HCT: 27.1 % — ABNORMAL LOW (ref 38.4–49.9)
HGB: 7.5 g/dL — ABNORMAL LOW (ref 13.0–17.1)
IMMATURE RETIC FRACT: 27.5 % — AB (ref 3.00–10.60)
LYMPH%: 83.1 % — AB (ref 14.0–49.0)
MCH: 27.7 pg (ref 27.2–33.4)
MCHC: 27.7 g/dL — ABNORMAL LOW (ref 32.0–36.0)
MCV: 100 fL — AB (ref 79.3–98.0)
MONO#: 0.1 10*3/uL (ref 0.1–0.9)
MONO%: 5.1 % (ref 0.0–14.0)
NEUT%: 11.1 % — AB (ref 39.0–75.0)
NEUTROS ABS: 0.2 10*3/uL — AB (ref 1.5–6.5)
PLATELETS: 199 10*3/uL (ref 140–400)
RBC: 2.71 10*6/uL — AB (ref 4.20–5.82)
RDW: 18.2 % — ABNORMAL HIGH (ref 11.0–14.6)
Retic %: 3.29 % — ABNORMAL HIGH (ref 0.80–1.80)
Retic Ct Abs: 89.16 10*3/uL (ref 34.80–93.90)
WBC: 1.4 10*3/uL — AB (ref 4.0–10.3)
lymph#: 1.1 10*3/uL (ref 0.9–3.3)

## 2016-07-04 MED ORDER — AZACITIDINE CHEMO SQ INJECTION
76.0000 mg/m2 | Freq: Once | INTRAMUSCULAR | Status: AC
  Administered 2016-07-04: 145 mg via SUBCUTANEOUS
  Filled 2016-07-04: qty 5.8

## 2016-07-04 MED ORDER — ONDANSETRON HCL 8 MG PO TABS
ORAL_TABLET | ORAL | Status: AC
  Filled 2016-07-04: qty 1

## 2016-07-04 MED ORDER — ONDANSETRON HCL 8 MG PO TABS
8.0000 mg | ORAL_TABLET | Freq: Once | ORAL | Status: AC
  Administered 2016-07-04: 8 mg via ORAL

## 2016-07-04 NOTE — Patient Instructions (Signed)
Azacitidine suspension for injection (subcutaneous use) What is this medicine? AZACITIDINE (ay za SITE i deen) is a chemotherapy drug. This medicine reduces the growth of cancer cells and can suppress the immune system. It is used for treating myelodysplastic syndrome or some types of leukemia. This medicine may be used for other purposes; ask your health care provider or pharmacist if you have questions. What should I tell my health care provider before I take this medicine? They need to know if you have any of these conditions: -infection (especially a virus infection such as chickenpox, cold sores, or herpes) -kidney disease -liver disease -liver tumors -an unusual or allergic reaction to azacitidine, mannitol, other medicines, foods, dyes, or preservatives -pregnant or trying to get pregnant -breast-feeding How should I use this medicine? This medicine is for injection under the skin. It is administered in a hospital or clinic by a specially trained health care professional. Talk to your pediatrician regarding the use of this medicine in children. While this drug may be prescribed for selected conditions, precautions do apply. Overdosage: If you think you have taken too much of this medicine contact a poison control center or emergency room at once. NOTE: This medicine is only for you. Do not share this medicine with others. What if I miss a dose? It is important not to miss your dose. Call your doctor or health care professional if you are unable to keep an appointment. What may interact with this medicine? Interactions have not been studied. Give your health care provider a list of all the medicines, herbs, non-prescription drugs, or dietary supplements you use. Also tell them if you smoke, drink alcohol, or use illegal drugs. Some items may interact with your medicine. This list may not describe all possible interactions. Give your health care provider a list of all the medicines,  herbs, non-prescription drugs, or dietary supplements you use. Also tell them if you smoke, drink alcohol, or use illegal drugs. Some items may interact with your medicine. What should I watch for while using this medicine? Visit your doctor for checks on your progress. This drug may make you feel generally unwell. This is not uncommon, as chemotherapy can affect healthy cells as well as cancer cells. Report any side effects. Continue your course of treatment even though you feel ill unless your doctor tells you to stop. In some cases, you may be given additional medicines to help with side effects. Follow all directions for their use. Call your doctor or health care professional for advice if you get a fever, chills or sore throat, or other symptoms of a cold or flu. Do not treat yourself. This drug decreases your body's ability to fight infections. Try to avoid being around people who are sick. This medicine may increase your risk to bruise or bleed. Call your doctor or health care professional if you notice any unusual bleeding. Do not have any vaccinations without your doctor's approval and avoid anyone who has recently had oral polio vaccine. Do not become pregnant while taking this medicine. Women should inform their doctor if they wish to become pregnant or think they might be pregnant. There is a potential for serious side effects to an unborn child. Talk to your health care professional or pharmacist for more information. Do not breast-feed an infant while taking this medicine. If you are a man, you should not father a child while receiving treatment. What side effects may I notice from receiving this medicine? Side effects that you should report   to your doctor or health care professional as soon as possible: -allergic reactions like skin rash, itching or hives, swelling of the face, lips, or tongue -low blood counts - this medicine may decrease the number of white blood cells, red blood cells  and platelets. You may be at increased risk for infections and bleeding. -signs of infection - fever or chills, cough, sore throat, pain or difficulty passing urine -signs of decreased platelets or bleeding - bruising, pinpoint red spots on the skin, black, tarry stools, blood in the urine -signs of decreased red blood cells - unusually weak or tired, fainting spells, lightheadedness -reactions at the injection site including redness, pain, itching, or bruising -breathing problems -changes in vision -fever -mouth sores -stomach pain -vomiting Side effects that usually do not require medical attention (report to your doctor or health care professional if they continue or are bothersome): -constipation -diarrhea -loss of appetite -nausea -pain or redness at the injection site -weak or tired This list may not describe all possible side effects. Call your doctor for medical advice about side effects. You may report side effects to FDA at 1-800-FDA-1088. Where should I keep my medicine? This drug is given in a hospital or clinic and will not be stored at home. NOTE: This sheet is a summary. It may not cover all possible information. If you have questions about this medicine, talk to your doctor, pharmacist, or health care provider.    2016, Elsevier/Gold Standard. (2014-08-08 18:13:53)  

## 2016-07-04 NOTE — Progress Notes (Signed)
Per Darden Dates RN per Dr. Irene Limbo okay to treat with today's (07/04/16) CBC results.

## 2016-07-05 ENCOUNTER — Ambulatory Visit (HOSPITAL_BASED_OUTPATIENT_CLINIC_OR_DEPARTMENT_OTHER): Payer: Medicare Other

## 2016-07-05 VITALS — BP 116/60 | HR 85 | Temp 98.4°F | Resp 18

## 2016-07-05 DIAGNOSIS — Z5111 Encounter for antineoplastic chemotherapy: Secondary | ICD-10-CM

## 2016-07-05 DIAGNOSIS — D4622 Refractory anemia with excess of blasts 2: Secondary | ICD-10-CM | POA: Diagnosis not present

## 2016-07-05 MED ORDER — ONDANSETRON HCL 8 MG PO TABS
8.0000 mg | ORAL_TABLET | Freq: Once | ORAL | Status: AC
  Administered 2016-07-05: 8 mg via ORAL

## 2016-07-05 MED ORDER — AZACITIDINE CHEMO SQ INJECTION
76.0000 mg/m2 | Freq: Once | INTRAMUSCULAR | Status: AC
  Administered 2016-07-05: 145 mg via SUBCUTANEOUS
  Filled 2016-07-05: qty 5.8

## 2016-07-05 MED ORDER — ONDANSETRON HCL 8 MG PO TABS
ORAL_TABLET | ORAL | Status: AC
  Filled 2016-07-05: qty 1

## 2016-07-05 NOTE — Patient Instructions (Signed)
Azacitidine suspension for injection (subcutaneous use) What is this medicine? AZACITIDINE (ay za SITE i deen) is a chemotherapy drug. This medicine reduces the growth of cancer cells and can suppress the immune system. It is used for treating myelodysplastic syndrome or some types of leukemia. This medicine may be used for other purposes; ask your health care provider or pharmacist if you have questions. What should I tell my health care provider before I take this medicine? They need to know if you have any of these conditions: -infection (especially a virus infection such as chickenpox, cold sores, or herpes) -kidney disease -liver disease -liver tumors -an unusual or allergic reaction to azacitidine, mannitol, other medicines, foods, dyes, or preservatives -pregnant or trying to get pregnant -breast-feeding How should I use this medicine? This medicine is for injection under the skin. It is administered in a hospital or clinic by a specially trained health care professional. Talk to your pediatrician regarding the use of this medicine in children. While this drug may be prescribed for selected conditions, precautions do apply. Overdosage: If you think you have taken too much of this medicine contact a poison control center or emergency room at once. NOTE: This medicine is only for you. Do not share this medicine with others. What if I miss a dose? It is important not to miss your dose. Call your doctor or health care professional if you are unable to keep an appointment. What may interact with this medicine? Interactions have not been studied. Give your health care provider a list of all the medicines, herbs, non-prescription drugs, or dietary supplements you use. Also tell them if you smoke, drink alcohol, or use illegal drugs. Some items may interact with your medicine. This list may not describe all possible interactions. Give your health care provider a list of all the medicines,  herbs, non-prescription drugs, or dietary supplements you use. Also tell them if you smoke, drink alcohol, or use illegal drugs. Some items may interact with your medicine. What should I watch for while using this medicine? Visit your doctor for checks on your progress. This drug may make you feel generally unwell. This is not uncommon, as chemotherapy can affect healthy cells as well as cancer cells. Report any side effects. Continue your course of treatment even though you feel ill unless your doctor tells you to stop. In some cases, you may be given additional medicines to help with side effects. Follow all directions for their use. Call your doctor or health care professional for advice if you get a fever, chills or sore throat, or other symptoms of a cold or flu. Do not treat yourself. This drug decreases your body's ability to fight infections. Try to avoid being around people who are sick. This medicine may increase your risk to bruise or bleed. Call your doctor or health care professional if you notice any unusual bleeding. Do not have any vaccinations without your doctor's approval and avoid anyone who has recently had oral polio vaccine. Do not become pregnant while taking this medicine. Women should inform their doctor if they wish to become pregnant or think they might be pregnant. There is a potential for serious side effects to an unborn child. Talk to your health care professional or pharmacist for more information. Do not breast-feed an infant while taking this medicine. If you are a man, you should not father a child while receiving treatment. What side effects may I notice from receiving this medicine? Side effects that you should report   to your doctor or health care professional as soon as possible: -allergic reactions like skin rash, itching or hives, swelling of the face, lips, or tongue -low blood counts - this medicine may decrease the number of white blood cells, red blood cells  and platelets. You may be at increased risk for infections and bleeding. -signs of infection - fever or chills, cough, sore throat, pain or difficulty passing urine -signs of decreased platelets or bleeding - bruising, pinpoint red spots on the skin, black, tarry stools, blood in the urine -signs of decreased red blood cells - unusually weak or tired, fainting spells, lightheadedness -reactions at the injection site including redness, pain, itching, or bruising -breathing problems -changes in vision -fever -mouth sores -stomach pain -vomiting Side effects that usually do not require medical attention (report to your doctor or health care professional if they continue or are bothersome): -constipation -diarrhea -loss of appetite -nausea -pain or redness at the injection site -weak or tired This list may not describe all possible side effects. Call your doctor for medical advice about side effects. You may report side effects to FDA at 1-800-FDA-1088. Where should I keep my medicine? This drug is given in a hospital or clinic and will not be stored at home. NOTE: This sheet is a summary. It may not cover all possible information. If you have questions about this medicine, talk to your doctor, pharmacist, or health care provider.    2016, Elsevier/Gold Standard. (2014-08-08 18:13:53)  

## 2016-07-06 ENCOUNTER — Ambulatory Visit (HOSPITAL_BASED_OUTPATIENT_CLINIC_OR_DEPARTMENT_OTHER): Payer: Medicare Other

## 2016-07-06 VITALS — BP 101/59 | HR 76 | Temp 99.0°F | Resp 18

## 2016-07-06 DIAGNOSIS — D4622 Refractory anemia with excess of blasts 2: Secondary | ICD-10-CM

## 2016-07-06 DIAGNOSIS — Z5111 Encounter for antineoplastic chemotherapy: Secondary | ICD-10-CM

## 2016-07-06 MED ORDER — ONDANSETRON HCL 8 MG PO TABS
8.0000 mg | ORAL_TABLET | Freq: Once | ORAL | Status: AC
  Administered 2016-07-06: 8 mg via ORAL

## 2016-07-06 MED ORDER — AZACITIDINE CHEMO SQ INJECTION
145.0000 mg | Freq: Once | INTRAMUSCULAR | Status: AC
  Administered 2016-07-06: 145 mg via SUBCUTANEOUS
  Filled 2016-07-06: qty 5.8

## 2016-07-06 MED ORDER — ONDANSETRON HCL 8 MG PO TABS
ORAL_TABLET | ORAL | Status: AC
  Filled 2016-07-06: qty 1

## 2016-07-06 NOTE — Patient Instructions (Signed)
Azacitidine suspension for injection (subcutaneous use) What is this medicine? AZACITIDINE (ay za SITE i deen) is a chemotherapy drug. This medicine reduces the growth of cancer cells and can suppress the immune system. It is used for treating myelodysplastic syndrome or some types of leukemia. This medicine may be used for other purposes; ask your health care provider or pharmacist if you have questions. What should I tell my health care provider before I take this medicine? They need to know if you have any of these conditions: -infection (especially a virus infection such as chickenpox, cold sores, or herpes) -kidney disease -liver disease -liver tumors -an unusual or allergic reaction to azacitidine, mannitol, other medicines, foods, dyes, or preservatives -pregnant or trying to get pregnant -breast-feeding How should I use this medicine? This medicine is for injection under the skin. It is administered in a hospital or clinic by a specially trained health care professional. Talk to your pediatrician regarding the use of this medicine in children. While this drug may be prescribed for selected conditions, precautions do apply. Overdosage: If you think you have taken too much of this medicine contact a poison control center or emergency room at once. NOTE: This medicine is only for you. Do not share this medicine with others. What if I miss a dose? It is important not to miss your dose. Call your doctor or health care professional if you are unable to keep an appointment. What may interact with this medicine? Interactions have not been studied. Give your health care provider a list of all the medicines, herbs, non-prescription drugs, or dietary supplements you use. Also tell them if you smoke, drink alcohol, or use illegal drugs. Some items may interact with your medicine. This list may not describe all possible interactions. Give your health care provider a list of all the medicines,  herbs, non-prescription drugs, or dietary supplements you use. Also tell them if you smoke, drink alcohol, or use illegal drugs. Some items may interact with your medicine. What should I watch for while using this medicine? Visit your doctor for checks on your progress. This drug may make you feel generally unwell. This is not uncommon, as chemotherapy can affect healthy cells as well as cancer cells. Report any side effects. Continue your course of treatment even though you feel ill unless your doctor tells you to stop. In some cases, you may be given additional medicines to help with side effects. Follow all directions for their use. Call your doctor or health care professional for advice if you get a fever, chills or sore throat, or other symptoms of a cold or flu. Do not treat yourself. This drug decreases your body's ability to fight infections. Try to avoid being around people who are sick. This medicine may increase your risk to bruise or bleed. Call your doctor or health care professional if you notice any unusual bleeding. Do not have any vaccinations without your doctor's approval and avoid anyone who has recently had oral polio vaccine. Do not become pregnant while taking this medicine. Women should inform their doctor if they wish to become pregnant or think they might be pregnant. There is a potential for serious side effects to an unborn child. Talk to your health care professional or pharmacist for more information. Do not breast-feed an infant while taking this medicine. If you are a man, you should not father a child while receiving treatment. What side effects may I notice from receiving this medicine? Side effects that you should report   to your doctor or health care professional as soon as possible: -allergic reactions like skin rash, itching or hives, swelling of the face, lips, or tongue -low blood counts - this medicine may decrease the number of white blood cells, red blood cells  and platelets. You may be at increased risk for infections and bleeding. -signs of infection - fever or chills, cough, sore throat, pain or difficulty passing urine -signs of decreased platelets or bleeding - bruising, pinpoint red spots on the skin, black, tarry stools, blood in the urine -signs of decreased red blood cells - unusually weak or tired, fainting spells, lightheadedness -reactions at the injection site including redness, pain, itching, or bruising -breathing problems -changes in vision -fever -mouth sores -stomach pain -vomiting Side effects that usually do not require medical attention (report to your doctor or health care professional if they continue or are bothersome): -constipation -diarrhea -loss of appetite -nausea -pain or redness at the injection site -weak or tired This list may not describe all possible side effects. Call your doctor for medical advice about side effects. You may report side effects to FDA at 1-800-FDA-1088. Where should I keep my medicine? This drug is given in a hospital or clinic and will not be stored at home. NOTE: This sheet is a summary. It may not cover all possible information. If you have questions about this medicine, talk to your doctor, pharmacist, or health care provider.    2016, Elsevier/Gold Standard. (2014-08-08 18:13:53)  

## 2016-07-07 ENCOUNTER — Telehealth: Payer: Self-pay | Admitting: *Deleted

## 2016-07-07 ENCOUNTER — Ambulatory Visit (HOSPITAL_BASED_OUTPATIENT_CLINIC_OR_DEPARTMENT_OTHER): Payer: Medicare Other

## 2016-07-07 VITALS — BP 152/98 | HR 77 | Temp 98.7°F | Resp 16

## 2016-07-07 DIAGNOSIS — Z5111 Encounter for antineoplastic chemotherapy: Secondary | ICD-10-CM | POA: Diagnosis present

## 2016-07-07 DIAGNOSIS — D4622 Refractory anemia with excess of blasts 2: Secondary | ICD-10-CM | POA: Diagnosis not present

## 2016-07-07 MED ORDER — AZACITIDINE CHEMO SQ INJECTION
76.0000 mg/m2 | Freq: Once | INTRAMUSCULAR | Status: AC
  Administered 2016-07-07: 145 mg via SUBCUTANEOUS
  Filled 2016-07-07: qty 5.8

## 2016-07-07 MED ORDER — ONDANSETRON HCL 8 MG PO TABS
ORAL_TABLET | ORAL | Status: AC
  Filled 2016-07-07: qty 1

## 2016-07-07 MED ORDER — ONDANSETRON HCL 8 MG PO TABS
8.0000 mg | ORAL_TABLET | Freq: Once | ORAL | Status: AC
  Administered 2016-07-07: 8 mg via ORAL

## 2016-07-07 NOTE — Telephone Encounter (Signed)
Per POF I have scheduled appts, patient here and given schedule

## 2016-07-07 NOTE — Telephone Encounter (Signed)
Ok per desk RN to change appt from 30 minutes to 15 minutes

## 2016-07-07 NOTE — Patient Instructions (Signed)
Azacitidine suspension for injection (subcutaneous use) What is this medicine? AZACITIDINE (ay za SITE i deen) is a chemotherapy drug. This medicine reduces the growth of cancer cells and can suppress the immune system. It is used for treating myelodysplastic syndrome or some types of leukemia. This medicine may be used for other purposes; ask your health care provider or pharmacist if you have questions. What should I tell my health care provider before I take this medicine? They need to know if you have any of these conditions: -infection (especially a virus infection such as chickenpox, cold sores, or herpes) -kidney disease -liver disease -liver tumors -an unusual or allergic reaction to azacitidine, mannitol, other medicines, foods, dyes, or preservatives -pregnant or trying to get pregnant -breast-feeding How should I use this medicine? This medicine is for injection under the skin. It is administered in a hospital or clinic by a specially trained health care professional. Talk to your pediatrician regarding the use of this medicine in children. While this drug may be prescribed for selected conditions, precautions do apply. Overdosage: If you think you have taken too much of this medicine contact a poison control center or emergency room at once. NOTE: This medicine is only for you. Do not share this medicine with others. What if I miss a dose? It is important not to miss your dose. Call your doctor or health care professional if you are unable to keep an appointment. What may interact with this medicine? Interactions have not been studied. Give your health care provider a list of all the medicines, herbs, non-prescription drugs, or dietary supplements you use. Also tell them if you smoke, drink alcohol, or use illegal drugs. Some items may interact with your medicine. This list may not describe all possible interactions. Give your health care provider a list of all the medicines,  herbs, non-prescription drugs, or dietary supplements you use. Also tell them if you smoke, drink alcohol, or use illegal drugs. Some items may interact with your medicine. What should I watch for while using this medicine? Visit your doctor for checks on your progress. This drug may make you feel generally unwell. This is not uncommon, as chemotherapy can affect healthy cells as well as cancer cells. Report any side effects. Continue your course of treatment even though you feel ill unless your doctor tells you to stop. In some cases, you may be given additional medicines to help with side effects. Follow all directions for their use. Call your doctor or health care professional for advice if you get a fever, chills or sore throat, or other symptoms of a cold or flu. Do not treat yourself. This drug decreases your body's ability to fight infections. Try to avoid being around people who are sick. This medicine may increase your risk to bruise or bleed. Call your doctor or health care professional if you notice any unusual bleeding. Do not have any vaccinations without your doctor's approval and avoid anyone who has recently had oral polio vaccine. Do not become pregnant while taking this medicine. Women should inform their doctor if they wish to become pregnant or think they might be pregnant. There is a potential for serious side effects to an unborn child. Talk to your health care professional or pharmacist for more information. Do not breast-feed an infant while taking this medicine. If you are a man, you should not father a child while receiving treatment. What side effects may I notice from receiving this medicine? Side effects that you should report   to your doctor or health care professional as soon as possible: -allergic reactions like skin rash, itching or hives, swelling of the face, lips, or tongue -low blood counts - this medicine may decrease the number of white blood cells, red blood cells  and platelets. You may be at increased risk for infections and bleeding. -signs of infection - fever or chills, cough, sore throat, pain or difficulty passing urine -signs of decreased platelets or bleeding - bruising, pinpoint red spots on the skin, black, tarry stools, blood in the urine -signs of decreased red blood cells - unusually weak or tired, fainting spells, lightheadedness -reactions at the injection site including redness, pain, itching, or bruising -breathing problems -changes in vision -fever -mouth sores -stomach pain -vomiting Side effects that usually do not require medical attention (report to your doctor or health care professional if they continue or are bothersome): -constipation -diarrhea -loss of appetite -nausea -pain or redness at the injection site -weak or tired This list may not describe all possible side effects. Call your doctor for medical advice about side effects. You may report side effects to FDA at 1-800-FDA-1088. Where should I keep my medicine? This drug is given in a hospital or clinic and will not be stored at home. NOTE: This sheet is a summary. It may not cover all possible information. If you have questions about this medicine, talk to your doctor, pharmacist, or health care provider.    2016, Elsevier/Gold Standard. (2014-08-08 18:13:53)  

## 2016-07-08 ENCOUNTER — Ambulatory Visit (HOSPITAL_BASED_OUTPATIENT_CLINIC_OR_DEPARTMENT_OTHER): Payer: Medicare Other

## 2016-07-08 VITALS — BP 125/56 | HR 77 | Temp 98.7°F | Resp 16

## 2016-07-08 DIAGNOSIS — Z5111 Encounter for antineoplastic chemotherapy: Secondary | ICD-10-CM

## 2016-07-08 DIAGNOSIS — D4622 Refractory anemia with excess of blasts 2: Secondary | ICD-10-CM

## 2016-07-08 MED ORDER — ONDANSETRON HCL 8 MG PO TABS
8.0000 mg | ORAL_TABLET | Freq: Once | ORAL | Status: AC
  Administered 2016-07-08: 8 mg via ORAL

## 2016-07-08 MED ORDER — AZACITIDINE CHEMO SQ INJECTION
75.5000 mg/m2 | Freq: Once | INTRAMUSCULAR | Status: AC
  Administered 2016-07-08: 145 mg via SUBCUTANEOUS
  Filled 2016-07-08: qty 5.8

## 2016-07-08 MED ORDER — ONDANSETRON HCL 8 MG PO TABS
ORAL_TABLET | ORAL | Status: AC
  Filled 2016-07-08: qty 1

## 2016-07-08 NOTE — Patient Instructions (Signed)
[facility] Discharge Instructions for Patients Receiving Chemotherapy  Today you received the following chemotherapy agents velcade   To help prevent nausea and vomiting after your treatment, we encourage you to take your nausea medication as directed  If you develop nausea and vomiting that is not controlled by your nausea medication, call the clinic.   BELOW ARE SYMPTOMS THAT SHOULD BE REPORTED IMMEDIATELY:  *FEVER GREATER THAN 100.5 F  *CHILLS WITH OR WITHOUT FEVER  NAUSEA AND VOMITING THAT IS NOT CONTROLLED WITH YOUR NAUSEA MEDICATION  *UNUSUAL SHORTNESS OF BREATH  *UNUSUAL BRUISING OR BLEEDING  TENDERNESS IN MOUTH AND THROAT WITH OR WITHOUT PRESENCE OF ULCERS  *URINARY PROBLEMS  *BOWEL PROBLEMS  UNUSUAL RASH Items with * indicate a potential emergency and should be followed up as soon as possible.  Feel free to call the clinic you have any questions or concerns. The clinic phone number is [phone].  

## 2016-07-11 ENCOUNTER — Other Ambulatory Visit: Payer: Self-pay | Admitting: Medical Oncology

## 2016-07-11 ENCOUNTER — Other Ambulatory Visit (HOSPITAL_BASED_OUTPATIENT_CLINIC_OR_DEPARTMENT_OTHER): Payer: Medicare Other

## 2016-07-11 ENCOUNTER — Ambulatory Visit (HOSPITAL_BASED_OUTPATIENT_CLINIC_OR_DEPARTMENT_OTHER): Payer: Medicare Other

## 2016-07-11 VITALS — BP 112/58 | HR 68 | Temp 98.5°F | Resp 16

## 2016-07-11 DIAGNOSIS — D4622 Refractory anemia with excess of blasts 2: Secondary | ICD-10-CM | POA: Diagnosis present

## 2016-07-11 DIAGNOSIS — Z5111 Encounter for antineoplastic chemotherapy: Secondary | ICD-10-CM | POA: Diagnosis present

## 2016-07-11 LAB — CBC & DIFF AND RETIC
BASO%: 0.7 % (ref 0.0–2.0)
Basophils Absolute: 0 10*3/uL (ref 0.0–0.1)
EOS%: 2.2 % (ref 0.0–7.0)
Eosinophils Absolute: 0 10*3/uL (ref 0.0–0.5)
HCT: 25.5 % — ABNORMAL LOW (ref 38.4–49.9)
HGB: 7.2 g/dL — ABNORMAL LOW (ref 13.0–17.1)
IMMATURE RETIC FRACT: 27.5 % — AB (ref 3.00–10.60)
LYMPH%: 78.7 % — ABNORMAL HIGH (ref 14.0–49.0)
MCH: 28.1 pg (ref 27.2–33.4)
MCHC: 28.2 g/dL — ABNORMAL LOW (ref 32.0–36.0)
MCV: 99.6 fL — ABNORMAL HIGH (ref 79.3–98.0)
MONO#: 0 10*3/uL — AB (ref 0.1–0.9)
MONO%: 0.7 % (ref 0.0–14.0)
NEUT%: 17.7 % — AB (ref 39.0–75.0)
NEUTROS ABS: 0.2 10*3/uL — AB (ref 1.5–6.5)
Platelets: 286 10*3/uL (ref 140–400)
RBC: 2.56 10*6/uL — ABNORMAL LOW (ref 4.20–5.82)
RDW: 18.1 % — AB (ref 11.0–14.6)
RETIC %: 2.94 % — AB (ref 0.80–1.80)
RETIC CT ABS: 75.26 10*3/uL (ref 34.80–93.90)
WBC: 1.4 10*3/uL — AB (ref 4.0–10.3)
lymph#: 1.1 10*3/uL (ref 0.9–3.3)

## 2016-07-11 LAB — COMPREHENSIVE METABOLIC PANEL
ALT: 17 U/L (ref 0–55)
AST: 23 U/L (ref 5–34)
Albumin: 3.3 g/dL — ABNORMAL LOW (ref 3.5–5.0)
Alkaline Phosphatase: 53 U/L (ref 40–150)
Anion Gap: 5 mEq/L (ref 3–11)
BILIRUBIN TOTAL: 1.07 mg/dL (ref 0.20–1.20)
BUN: 17.5 mg/dL (ref 7.0–26.0)
CHLORIDE: 107 meq/L (ref 98–109)
CO2: 29 mEq/L (ref 22–29)
CREATININE: 1 mg/dL (ref 0.7–1.3)
Calcium: 8.6 mg/dL (ref 8.4–10.4)
EGFR: 74 mL/min/{1.73_m2} — ABNORMAL LOW (ref >90)
GLUCOSE: 110 mg/dL (ref 70–140)
Potassium: 4.3 mEq/L (ref 3.5–5.1)
SODIUM: 140 meq/L (ref 136–145)
TOTAL PROTEIN: 6 g/dL — AB (ref 6.4–8.3)

## 2016-07-11 MED ORDER — ONDANSETRON HCL 8 MG PO TABS
ORAL_TABLET | ORAL | Status: AC
  Filled 2016-07-11: qty 1

## 2016-07-11 MED ORDER — AZACITIDINE CHEMO SQ INJECTION
76.0000 mg/m2 | Freq: Once | INTRAMUSCULAR | Status: AC
  Administered 2016-07-11: 145 mg via SUBCUTANEOUS
  Filled 2016-07-11: qty 5.8

## 2016-07-11 MED ORDER — ONDANSETRON HCL 8 MG PO TABS
8.0000 mg | ORAL_TABLET | Freq: Once | ORAL | Status: AC
  Administered 2016-07-11: 8 mg via ORAL

## 2016-07-11 NOTE — Patient Instructions (Signed)
[facility] Center Discharge Instructions for Patients Receiving Chemotherapy  Today you received the following chemotherapy agents VIDAZA To help prevent nausea and vomiting after your treatment, we encourage you to take your nausea medication as prescribed.   If you develop nausea and vomiting that is not controlled by your nausea medication, call the clinic.   BELOW ARE SYMPTOMS THAT SHOULD BE REPORTED IMMEDIATELY:  *FEVER GREATER THAN 100.5 F  *CHILLS WITH OR WITHOUT FEVER  NAUSEA AND VOMITING THAT IS NOT CONTROLLED WITH YOUR NAUSEA MEDICATION  *UNUSUAL SHORTNESS OF BREATH  *UNUSUAL BRUISING OR BLEEDING  TENDERNESS IN MOUTH AND THROAT WITH OR WITHOUT PRESENCE OF ULCERS  *URINARY PROBLEMS  *BOWEL PROBLEMS  UNUSUAL RASH Items with * indicate a potential emergency and should be followed up as soon as possible.  Feel free to call the clinic you have any questions or concerns. The clinic phone number is [phone].  Please show the CHEMO ALERT CARD at check-in to the Emergency Department and triage nurse.   

## 2016-07-12 ENCOUNTER — Ambulatory Visit (HOSPITAL_BASED_OUTPATIENT_CLINIC_OR_DEPARTMENT_OTHER): Payer: Medicare Other

## 2016-07-12 ENCOUNTER — Telehealth: Payer: Self-pay | Admitting: *Deleted

## 2016-07-12 ENCOUNTER — Other Ambulatory Visit: Payer: Self-pay | Admitting: *Deleted

## 2016-07-12 VITALS — BP 121/55 | HR 82 | Temp 98.5°F | Resp 16

## 2016-07-12 DIAGNOSIS — Z5111 Encounter for antineoplastic chemotherapy: Secondary | ICD-10-CM | POA: Diagnosis present

## 2016-07-12 DIAGNOSIS — D4622 Refractory anemia with excess of blasts 2: Secondary | ICD-10-CM

## 2016-07-12 MED ORDER — ONDANSETRON HCL 8 MG PO TABS
8.0000 mg | ORAL_TABLET | Freq: Once | ORAL | Status: AC
  Administered 2016-07-12: 8 mg via ORAL

## 2016-07-12 MED ORDER — AZACITIDINE CHEMO SQ INJECTION
76.0000 mg/m2 | Freq: Once | INTRAMUSCULAR | Status: AC
  Administered 2016-07-12: 145 mg via SUBCUTANEOUS
  Filled 2016-07-12: qty 5.8

## 2016-07-12 MED ORDER — ONDANSETRON HCL 8 MG PO TABS
ORAL_TABLET | ORAL | Status: AC
  Filled 2016-07-12: qty 1

## 2016-07-12 NOTE — Telephone Encounter (Signed)
Patient schedule was incorrect the week of August 28th. Appts moved one week later

## 2016-07-15 ENCOUNTER — Telehealth: Payer: Self-pay | Admitting: *Deleted

## 2016-07-15 NOTE — Telephone Encounter (Signed)
Pt called stating he feels "puny" today.  Tickle in throat  This morning that has resolved with salt water gargle.  Pt felt a chill or two earlier in the day, temp 97.2.  No other signs of infection.  Pt stated BP/pulse were normal when taken at home.  Offered pt to have labs drawn today instead of waiting until Monday due to low Hgb earlier in the week.  Pt stated he thinks he'll be ok until Monday.  Advised pt to go to ED or call triage if symptoms worsen over the weekend.  Pt verbalized understanding.

## 2016-07-17 ENCOUNTER — Emergency Department (HOSPITAL_COMMUNITY)
Admission: EM | Admit: 2016-07-17 | Discharge: 2016-07-17 | Disposition: A | Discharge status: Home / Self Care | Payer: Medicare Other | Attending: Emergency Medicine | Admitting: Emergency Medicine

## 2016-07-17 ENCOUNTER — Encounter (HOSPITAL_COMMUNITY): Payer: Self-pay | Admitting: Emergency Medicine

## 2016-07-17 ENCOUNTER — Emergency Department (HOSPITAL_COMMUNITY): Payer: Medicare Other

## 2016-07-17 DIAGNOSIS — I2510 Atherosclerotic heart disease of native coronary artery without angina pectoris: Secondary | ICD-10-CM | POA: Insufficient documentation

## 2016-07-17 DIAGNOSIS — J029 Acute pharyngitis, unspecified: Secondary | ICD-10-CM | POA: Insufficient documentation

## 2016-07-17 DIAGNOSIS — Z8546 Personal history of malignant neoplasm of prostate: Secondary | ICD-10-CM | POA: Diagnosis not present

## 2016-07-17 DIAGNOSIS — R6883 Chills (without fever): Secondary | ICD-10-CM

## 2016-07-17 DIAGNOSIS — R509 Fever, unspecified: Secondary | ICD-10-CM | POA: Diagnosis present

## 2016-07-17 DIAGNOSIS — D709 Neutropenia, unspecified: Secondary | ICD-10-CM | POA: Diagnosis not present

## 2016-07-17 DIAGNOSIS — Z79899 Other long term (current) drug therapy: Secondary | ICD-10-CM | POA: Insufficient documentation

## 2016-07-17 DIAGNOSIS — I10 Essential (primary) hypertension: Secondary | ICD-10-CM | POA: Diagnosis not present

## 2016-07-17 DIAGNOSIS — J449 Chronic obstructive pulmonary disease, unspecified: Secondary | ICD-10-CM | POA: Diagnosis not present

## 2016-07-17 DIAGNOSIS — C61 Malignant neoplasm of prostate: Secondary | ICD-10-CM | POA: Diagnosis not present

## 2016-07-17 LAB — COMPREHENSIVE METABOLIC PANEL
ALT: 18 U/L (ref 17–63)
ANION GAP: 3 — AB (ref 5–15)
AST: 27 U/L (ref 15–41)
Albumin: 3.7 g/dL (ref 3.5–5.0)
Alkaline Phosphatase: 43 U/L (ref 38–126)
BUN: 21 mg/dL — AB (ref 6–20)
CHLORIDE: 109 mmol/L (ref 101–111)
CO2: 27 mmol/L (ref 22–32)
Calcium: 8.3 mg/dL — ABNORMAL LOW (ref 8.9–10.3)
Creatinine, Ser: 0.9 mg/dL (ref 0.61–1.24)
GFR calc Af Amer: >60 mL/min (ref >60)
Glucose, Bld: 99 mg/dL (ref 65–99)
POTASSIUM: 4.1 mmol/L (ref 3.5–5.1)
Sodium: 139 mmol/L (ref 135–145)
Total Bilirubin: 1.6 mg/dL — ABNORMAL HIGH (ref 0.3–1.2)
Total Protein: 5.7 g/dL — ABNORMAL LOW (ref 6.5–8.1)

## 2016-07-17 LAB — I-STAT CHEM 8, ED
BUN: 21 mg/dL — AB (ref 6–20)
CHLORIDE: 103 mmol/L (ref 101–111)
Calcium, Ion: 1.16 mmol/L (ref 1.12–1.23)
Creatinine, Ser: 1 mg/dL (ref 0.61–1.24)
Glucose, Bld: 93 mg/dL (ref 65–99)
HEMATOCRIT: 25 % — AB (ref 39.0–52.0)
Hemoglobin: 8.5 g/dL — ABNORMAL LOW (ref 13.0–17.0)
POTASSIUM: 4.1 mmol/L (ref 3.5–5.1)
SODIUM: 141 mmol/L (ref 135–145)
TCO2: 27 mmol/L (ref 0–100)

## 2016-07-17 LAB — CBC WITH DIFFERENTIAL/PLATELET
BASOS ABS: 0 10*3/uL (ref 0.0–0.1)
Basophils Relative: 1 %
EOS ABS: 0 10*3/uL (ref 0.0–0.7)
Eosinophils Relative: 2 %
HCT: 24.6 % — ABNORMAL LOW (ref 39.0–52.0)
HEMOGLOBIN: 7.2 g/dL — AB (ref 13.0–17.0)
LYMPHS PCT: 84 %
Lymphs Abs: 1.2 10*3/uL (ref 0.7–4.0)
MCH: 28.7 pg (ref 26.0–34.0)
MCHC: 29.3 g/dL — AB (ref 30.0–36.0)
MCV: 98 fL (ref 78.0–100.0)
MONO ABS: 0 10*3/uL — AB (ref 0.1–1.0)
Monocytes Relative: 1 %
NEUTROS ABS: 0.2 10*3/uL — AB (ref 1.7–7.7)
NEUTROS PCT: 12 %
PLATELETS: 224 10*3/uL (ref 150–400)
RBC: 2.51 MIL/uL — ABNORMAL LOW (ref 4.22–5.81)
RDW: 18.5 % — ABNORMAL HIGH (ref 11.5–15.5)
WBC: 1.4 10*3/uL — CL (ref 4.0–10.5)

## 2016-07-17 LAB — URINALYSIS, ROUTINE W REFLEX MICROSCOPIC
Bilirubin Urine: NEGATIVE
Glucose, UA: NEGATIVE mg/dL
Hgb urine dipstick: NEGATIVE
Ketones, ur: NEGATIVE mg/dL
Leukocytes, UA: NEGATIVE
Nitrite: NEGATIVE
Protein, ur: NEGATIVE mg/dL
Specific Gravity, Urine: 1.006 (ref 1.005–1.030)
pH: 7 (ref 5.0–8.0)

## 2016-07-17 LAB — I-STAT CG4 LACTIC ACID, ED: Lactic Acid, Venous: 1.17 mmol/L (ref 0.5–1.9)

## 2016-07-17 MED ORDER — CIPROFLOXACIN HCL 500 MG PO TABS: 500.0000 mg | ORAL_TABLET | Freq: Two times a day (BID) | ORAL | 0 refills | Status: DC

## 2016-07-17 MED ORDER — CIPROFLOXACIN HCL 500 MG PO TABS
500.0000 mg | ORAL_TABLET | Freq: Once | ORAL | Status: AC
  Administered 2016-07-17: 500 mg via ORAL
  Filled 2016-07-17: qty 1

## 2016-07-17 MED ORDER — SODIUM CHLORIDE 0.9 % IV BOLUS (SEPSIS)
1000.0000 mL | Freq: Once | INTRAVENOUS | Status: AC
  Administered 2016-07-17: 1000 mL via INTRAVENOUS

## 2016-07-17 NOTE — Discharge Instructions (Signed)
Take cipro 500 mg twice daily for a week.   Call oncology office tomorrow regarding follow up.   Return to ER if you have fever > 101, temp < 96, vomiting, worse chills, worse sore throat.

## 2016-07-17 NOTE — ED Notes (Signed)
WBC 1.4 Critical Value, Hgb 7.2

## 2016-07-17 NOTE — ED Provider Notes (Signed)
[facility] DEPT Provider Note   CSN: [id number] Arrival date & time: 07/17/16  1540     History   Chief Complaint Chief Complaint  Patient presents with  . Sore Throat  . Chills  . cancer patient    HPI SCHNEIDER GAVINS is a 75 y.o. male hx of MDS, COPD, here with Chills, neutropenia. Patient states that he was on chemo about 2 weeks ago for MDS and has been neutropenic. He had some chills 2 days ago that resolved. He felt well yesterday. This morning, he noticed that he had some chills and sore throat again. He took his temperature and it was 97. Denies fevers. Has 4 episodes of loose stools. Denies vomiting. Denies cough. Just feel weaker than usual.   The history is provided by the patient.    Past Medical History:  Diagnosis Date  . Cancer [facility])    Prostate, Melanoma- lt. Shoulder (no further problems since 2010  . Complication of anesthesia    versed gave the adverse reaction pt. ,became aggitated  . COPD (chronic obstructive pulmonary disease) (HCC)    Dr. Annamaria Boots follows  . Diffuse esophageal spasm   . Diverticulosis   . DJD (degenerative joint disease)   . Multiple trauma     horse accident 2003 L SHOULDER  . Oral herpes   . OSA (obstructive sleep apnea)    mild, rare cpap use  . Peripheral neuropathy ([place])   . Pulmonary nodule   . Spinal stenosis   . Systolic hypertension     Patient Active Problem List   Diagnosis Date Noted  . Iron deficiency anemia 04/18/2016  . RAEB-2 (refractory anemia with excess blasts-2) ([place]) 03/24/2016  . Leucopenia 03/24/2016  . Neutropenia ([place]) 03/18/2016  . Anemia 03/18/2016  . Leukopenia 03/18/2016  . Pancytopenia ([place]) 03/18/2016  . History of blood transfusion 03/18/2016  . Thrombocytopenia ([place]) 03/18/2016  . Neutropenia, unspecified ([place]) 03/18/2016  . Dyspnea on exertion 03/09/2016  . Dyslipidemia 11/11/2014  . CAD (coronary artery disease), native coronary artery 11/11/2014  . Coronary artery calcification  10/10/2013  . Benign essential HTN   . Atherosclerosis of aorta ([place]) 09/12/2013  . Acute bronchitis 10/06/2010  . OSA (obstructive sleep apnea) 10/07/2008  . PULMONARY NODULE 10/07/2008    Past Surgical History:  Procedure Laterality Date  . ,laceration to head in accident with hourse    . ACOUSTIC NEUROMA LEFT EAR  1999  . BALLOON DILATION N/A 11/25/2014   Procedure: BALLOON DILATION;  Surgeon: Garlan Fair, MD;  Location: [facility];  Service: Endoscopy;  Laterality: N/A;  . COLONOSCOPY WITH PROPOFOL N/A 10/01/2013   Procedure: COLONOSCOPY WITH PROPOFOL;  Surgeon: Garlan Fair, MD;  [facility];  Service: Endoscopy;  Laterality: N/A;  . ESOPHAGOGASTRODUODENOSCOPY (EGD) WITH PROPOFOL N/A 11/25/2014   Procedure: ESOPHAGOGASTRODUODENOSCOPY (EGD) WITH PROPOFOL;  Surgeon: Garlan Fair, MD;  Location[facility];  Service: Endoscopy;  Laterality: N/A;  . EYE SURGERY     cosmetic surgery"brow lift" 4'15  . MULTIPLE PROCEDURES FOR LEFT ARM     ELBOW,RIB FX PNEUMOTHORAX AFTER FALLING OFF MOUNTAIN  . PILONIDAL CYST / SINUS EXCISION    . RADICAL PROSTATECTOMY         Home Medications    Prior to Admission medications   Medication Sig Start Date End Date Taking? Authorizing Provider  calcium carbonate (TUMS - DOSED IN MG ELEMENTAL CALCIUM) 500 MG chewable tablet Chew 1 tablet by mouth at bedtime.    Yes Historical  Provider, MD  chlorhexidine (PERIDEX) 0.12 % solution Use as directed 10 mLs in the mouth or throat 2 (two) times daily. Swish and spit. Do not swallow. Patient taking differently: Use as directed 10 mLs in the mouth or throat 2 (two) times daily as needed (thrush). Swish and spit. Do not swallow. 05/18/16  Yes Brunetta Genera, MD  omeprazole (PRILOSEC) 40 MG capsule Take 40 mg by mouth daily before lunch. 1/2 hour before.   Yes Historical Provider, MD  ondansetron (ZOFRAN) 4 MG tablet Take 4 mg by mouth every 8 (eight) hours as needed for nausea or  vomiting.  05/07/16  Yes Historical Provider, MD  senna-docusate (SENNA S) 8.6-50 MG tablet Take 2 tablets by mouth 2 (two) times daily. Patient taking differently: Take 2 tablets by mouth 2 (two) times daily as needed for mild constipation or moderate constipation.  04/06/16  Yes Brunetta Genera, MD  valACYclovir (VALTREX) 500 MG tablet Take 500 mg by mouth at bedtime. At night to suppress herpes gingivitis 09/12/13  Yes Deneise Lever, MD  valsartan (DIOVAN) 80 MG tablet Take 40 mg by mouth at bedtime.    Yes Historical Provider, MD  nitroGLYCERIN (NITROSTAT) 0.4 MG SL tablet Place 1 tablet (0.4 mg total) under the tongue every 5 (five) minutes as needed for chest pain. 11/11/14   Sueanne Margarita, MD    Family History Family History  Problem Relation Age of Onset  . Emphysema Father   . Asthma Father   . Lung cancer Father   . Breast cancer Sister   . Breast cancer Sister   . Skin cancer Brother   . Prostate cancer Brother     Social History Social History  Substance Use Topics  . Smoking status: Former Smoker    Packs/day: 1.00    Years: 40.00    Quit date: 11/29/1995  . Smokeless tobacco: Never Used  . Alcohol use Yes     Comment: 1 drink per day     Allergies   Midazolam   Review of Systems Review of Systems  Constitutional: Positive for chills.  All other systems reviewed and are negative.    Physical Exam Updated Vital Signs BP 124/72 (BP Location: Left Arm)   Pulse 65   Temp 98.5 F (36.9 C) (Oral)   Resp 16   SpO2 99%   Physical Exam  Constitutional: He is oriented to person, place, and time.  Chronically ill,   HENT:  Head: Normocephalic.  MM slightly dry. OP not red. No obvious periapical abscess or gum swelling   Eyes: EOM are normal. Pupils are equal, round, and reactive to light.  Neck: Normal range of motion. Neck supple.  Cardiovascular: Normal rate, regular rhythm and normal heart sounds.   Pulmonary/Chest: Effort normal and breath sounds  normal. No respiratory distress. He has no wheezes.  Abdominal: Soft. Bowel sounds are normal. He exhibits no distension. There is no tenderness. There is no guarding.  Musculoskeletal: Normal range of motion.  Neurological: He is alert and oriented to person, place, and time.  Skin: Skin is warm.  Psychiatric: He has a normal mood and affect.  Nursing note and vitals reviewed.    ED Treatments / Results  Labs (all labs ordered are listed, but only abnormal results are displayed) Labs Reviewed  CBC WITH DIFFERENTIAL/PLATELET - Abnormal; Notable for the following:       Result Value   WBC 1.4 (*)    RBC 2.51 (*)  Hemoglobin 7.2 (*)    HCT 24.6 (*)    MCHC 29.3 (*)    RDW 18.5 (*)    Neutro Abs 0.2 (*)    Monocytes Absolute 0.0 (*)    All other components within normal limits  COMPREHENSIVE METABOLIC PANEL - Abnormal; Notable for the following:    BUN 21 (*)    Calcium 8.3 (*)    Total Protein 5.7 (*)    Total Bilirubin 1.6 (*)    Anion gap 3 (*)    All other components within normal limits  I-STAT CHEM 8, ED - Abnormal; Notable for the following:    BUN 21 (*)    Hemoglobin 8.5 (*)    HCT 25.0 (*)    All other components within normal limits  CULTURE, BLOOD (ROUTINE X 2)  CULTURE, BLOOD (ROUTINE X 2)  URINE CULTURE  URINALYSIS, ROUTINE W REFLEX MICROSCOPIC (NOT AT [facility])  I-STAT CG4 LACTIC ACID, ED    EKG  EKG Interpretation None       Radiology Dg Chest 2 View  Result Date: 07/17/2016 CLINICAL DATA:  Hypothermia.  Chills.  Prostate cancer.  Anemia. EXAM: CHEST  2 VIEW COMPARISON:  03/09/2016 FINDINGS: Heart size is normal. There is aortic atherosclerosis. Mediastinal shadows are otherwise normal. The lungs are clear. No effusions. Healed or healing rib fracture of the left eighth rib posterior laterally. Ordinary degenerative changes affect the thoracic spine. IMPRESSION: No active cardiopulmonary disease. Healed or healing left eighth rib fracture.  Electronically Signed   By: Nelson Chimes M.D.   On: 07/17/2016 16:36    Procedures Procedures (including critical care time)  Medications Ordered in ED Medications  ciprofloxacin (CIPRO) tablet 500 mg (not administered)  sodium chloride 0.9 % bolus 1,000 mL (1,000 mLs Intravenous New Bag/Given 07/17/16 1623)     Initial Impression / Assessment and Plan / ED Course  I have reviewed the triage vital signs and the nursing notes.  Pertinent labs & imaging results that were available during my care of the patient were reviewed by me and considered in my medical decision making (see chart for details).  Clinical Course    OMARIAN SANDLER is a 75 y.o. male here with chills. He was neutropenic a week ago with ANC 200. He is afebrile, temp 98.5 in the ED. Lungs clear, abdomen nontender. I see no obvious signs of infection. Given hx of neutropenia, will get CBC, chemistry, lactate, cultures, CXR. Will reassess.   5:20 PM WBC 1.4. ANC 200. Hg 7.2. CXR and UA nl. Given 1 L NS bolus. Never hypotensive or tachycardic or febrile. I called Dr. Learta Codding, who knows about the patient. He recommend cipro 500 mg BID for 7 days and that I let Dr. Irene Limbo know. He is suppose to have lab draw and follow up with oncology tomorrow. Gave return precautions to come back to ED including fever > 101, worse chills, vomiting.   Final Clinical Impressions(s) / ED Diagnoses   Final diagnoses:  None    New Prescriptions New Prescriptions   No medications on file     Drenda Freeze, MD 07/17/16 1723

## 2016-07-17 NOTE — ED Notes (Signed)
Pt transported to xray 

## 2016-07-17 NOTE — ED Triage Notes (Signed)
Patient is [facility] cancer patient with blue chemo card.  Patient states that he has been very neutropenic and been very cautious of any new symptoms of infection.  Patient having sore throat, chills, and temp of 97. Patient states that baseline temp is 98.

## 2016-07-18 ENCOUNTER — Other Ambulatory Visit: Payer: Self-pay | Admitting: Radiology

## 2016-07-18 ENCOUNTER — Other Ambulatory Visit: Payer: Medicare Other

## 2016-07-18 ENCOUNTER — Other Ambulatory Visit: Payer: Self-pay | Admitting: General Surgery

## 2016-07-18 ENCOUNTER — Other Ambulatory Visit: Payer: Self-pay | Admitting: *Deleted

## 2016-07-18 LAB — URINE CULTURE: Culture: NO GROWTH

## 2016-07-19 ENCOUNTER — Ambulatory Visit (HOSPITAL_COMMUNITY)
Admission: RE | Admit: 2016-07-19 | Discharge: 2016-07-19 | Disposition: A | Discharge status: Home / Self Care | Payer: Medicare Other | Source: Ambulatory Visit | Attending: Hematology | Admitting: Hematology

## 2016-07-19 ENCOUNTER — Other Ambulatory Visit: Payer: Self-pay | Admitting: *Deleted

## 2016-07-19 ENCOUNTER — Encounter (HOSPITAL_COMMUNITY): Payer: Self-pay

## 2016-07-19 ENCOUNTER — Ambulatory Visit (HOSPITAL_BASED_OUTPATIENT_CLINIC_OR_DEPARTMENT_OTHER): Payer: Medicare Other

## 2016-07-19 ENCOUNTER — Other Ambulatory Visit: Payer: Medicare Other

## 2016-07-19 DIAGNOSIS — D649 Anemia, unspecified: Secondary | ICD-10-CM | POA: Diagnosis not present

## 2016-07-19 DIAGNOSIS — D509 Iron deficiency anemia, unspecified: Secondary | ICD-10-CM | POA: Insufficient documentation

## 2016-07-19 DIAGNOSIS — J449 Chronic obstructive pulmonary disease, unspecified: Secondary | ICD-10-CM | POA: Insufficient documentation

## 2016-07-19 DIAGNOSIS — C61 Malignant neoplasm of prostate: Secondary | ICD-10-CM | POA: Diagnosis not present

## 2016-07-19 DIAGNOSIS — G629 Polyneuropathy, unspecified: Secondary | ICD-10-CM | POA: Diagnosis not present

## 2016-07-19 DIAGNOSIS — D4622 Refractory anemia with excess of blasts 2: Secondary | ICD-10-CM | POA: Insufficient documentation

## 2016-07-19 DIAGNOSIS — G4733 Obstructive sleep apnea (adult) (pediatric): Secondary | ICD-10-CM | POA: Insufficient documentation

## 2016-07-19 DIAGNOSIS — Z85820 Personal history of malignant melanoma of skin: Secondary | ICD-10-CM | POA: Insufficient documentation

## 2016-07-19 DIAGNOSIS — D72819 Decreased white blood cell count, unspecified: Secondary | ICD-10-CM | POA: Insufficient documentation

## 2016-07-19 DIAGNOSIS — I10 Essential (primary) hypertension: Secondary | ICD-10-CM | POA: Diagnosis not present

## 2016-07-19 DIAGNOSIS — D61818 Other pancytopenia: Secondary | ICD-10-CM

## 2016-07-19 DIAGNOSIS — K224 Dyskinesia of esophagus: Secondary | ICD-10-CM | POA: Diagnosis not present

## 2016-07-19 DIAGNOSIS — K5790 Diverticulosis of intestine, part unspecified, without perforation or abscess without bleeding: Secondary | ICD-10-CM | POA: Diagnosis not present

## 2016-07-19 DIAGNOSIS — D469 Myelodysplastic syndrome, unspecified: Secondary | ICD-10-CM | POA: Diagnosis not present

## 2016-07-19 LAB — CBC WITH DIFFERENTIAL/PLATELET
BASOS PCT: 0 %
Basophils Absolute: 0 10*3/uL (ref 0.0–0.1)
EOS PCT: 2 %
Eosinophils Absolute: 0 10*3/uL (ref 0.0–0.7)
HCT: 24 % — ABNORMAL LOW (ref 39.0–52.0)
Hemoglobin: 6.9 g/dL — CL (ref 13.0–17.0)
LYMPHS ABS: 0.8 10*3/uL (ref 0.7–4.0)
Lymphocytes Relative: 88 %
MCH: 28.3 pg (ref 26.0–34.0)
MCHC: 28.8 g/dL — AB (ref 30.0–36.0)
MCV: 98.4 fL (ref 78.0–100.0)
MONO ABS: 0 10*3/uL — AB (ref 0.1–1.0)
Monocytes Relative: 3 %
NEUTROS ABS: 0.1 10*3/uL — AB (ref 1.7–7.7)
Neutrophils Relative %: 7 %
PLATELETS: 187 10*3/uL (ref 150–400)
RBC: 2.44 MIL/uL — ABNORMAL LOW (ref 4.22–5.81)
RDW: 19 % — AB (ref 11.5–15.5)
WBC: 0.9 10*3/uL — CL (ref 4.0–10.5)

## 2016-07-19 LAB — APTT: APTT: 35 s (ref 24–36)

## 2016-07-19 LAB — BONE MARROW EXAM

## 2016-07-19 LAB — PROTIME-INR
INR: 1.15
PROTHROMBIN TIME: 14.8 s (ref 11.4–15.2)

## 2016-07-19 LAB — PREPARE RBC (CROSSMATCH)

## 2016-07-19 MED ORDER — SODIUM CHLORIDE 0.9 % IV SOLN: 250.0000 mL | Freq: Once | INTRAVENOUS | Status: DC

## 2016-07-19 MED ORDER — DIPHENHYDRAMINE HCL 25 MG PO CAPS
ORAL_CAPSULE | ORAL | Status: AC
  Filled 2016-07-19: qty 1

## 2016-07-19 MED ORDER — FENTANYL CITRATE (PF) 100 MCG/2ML IJ SOLN
INTRAMUSCULAR | Status: AC
  Filled 2016-07-19: qty 4

## 2016-07-19 MED ORDER — SODIUM CHLORIDE 0.9 % IV SOLN
Freq: Once | INTRAVENOUS | Status: AC
  Administered 2016-07-19: 10:00:00 via INTRAVENOUS

## 2016-07-19 MED ORDER — DIPHENHYDRAMINE HCL 25 MG PO CAPS
25.0000 mg | ORAL_CAPSULE | Freq: Once | ORAL | Status: AC
  Administered 2016-07-19: 25 mg via ORAL

## 2016-07-19 MED ORDER — ACETAMINOPHEN 325 MG PO TABS
650.0000 mg | ORAL_TABLET | Freq: Once | ORAL | Status: AC
  Administered 2016-07-19: 650 mg via ORAL

## 2016-07-19 MED ORDER — FENTANYL CITRATE (PF) 100 MCG/2ML IJ SOLN
INTRAMUSCULAR | Status: AC | PRN
  Administered 2016-07-19: 75 ug via INTRAVENOUS
  Administered 2016-07-19 (×2): 25 ug via INTRAVENOUS

## 2016-07-19 MED ORDER — ACETAMINOPHEN 325 MG PO TABS
ORAL_TABLET | ORAL | Status: AC
  Filled 2016-07-19: qty 2

## 2016-07-19 NOTE — Discharge Instructions (Signed)
Needle Biopsy of the Bone A bone biopsy is a procedure in which a small sample of bone is removed. The sample is taken with a needle. Then, the bone sample is looked at under a microscope to check for abnormalities. The sample is usually taken from a bone that is close to the skin. This procedure may be done to check for various problems with the bone. You may need this procedure if imaging tests or blood tests have indicated a possible problem. This procedure may be done to help determine if a bone tumor is cancerous (malignant). A bone biopsy can help to diagnose problems such as:  Tumors of the bone (sarcomas) and bone marrow (multiple myeloma).  Bone that forms abnormally (Paget disease).  Noncancerous (benign) bone cysts.  Bony growths.  Infections in the bone. LET [facility] CARE PROVIDER KNOW ABOUT:  Any allergies you have.  All medicines you are taking, including vitamins, herbs, eye drops, creams, and over-the-counter medicines.  Previous problems you or members of your family have had with the use of anesthetics.  Any blood disorders you have.  Previous surgeries you have had.  Any medical conditions you have. RISKS AND COMPLICATIONS Generally, this is a safe procedure. However, problems may occur, including:  Excessive bleeding.  Infection.  Injury to surrounding tissue. BEFORE THE PROCEDURE  Ask your health care provider about:  Changing or stopping your regular medicines. This is especially important if you are taking diabetes medicines or blood thinners.  Taking medicines such as aspirin and ibuprofen. These medicines can thin your blood. Do not take these medicines before your procedure if your health care provider instructs you not to.  Follow instructions from your health care provider about eating or drinking restrictions.  Plan to have someone take you home after the procedure.  If you go home right after the procedure, plan to have someone with you  for 24 hours. PROCEDURE  An IV tube may be inserted into one of your veins.  The injection site will be cleaned with a germ-killing solution (antiseptic).  You will be given one or more of the following:  A medicine to help you relax (sedative).  A medicine to numb the area (local anesthetic).  The sample of bone will be removed by putting a large needle through the skin and into the bone.  The needle will be removed.  A bandage (dressing) will be placed over the insertion site and taped in place. The procedure may vary among health care providers and hospitals. AFTER THE PROCEDURE  Your blood pressure, heart rate, breathing rate, and blood oxygen level will be monitored often until the medicines you were given have worn off.  Return to your normal activities as told by your health care provider.   This information is not intended to replace advice given to you by your health care provider. Make sure you discuss any questions you have with your health care provider.   Document Released: 09/22/2004 Document Revised: 08/05/2015 Document Reviewed: 12/22/2014 Elsevier Interactive Patient Education 2016 [employer] Needle Biopsy of the Bone, Care After Refer to this sheet in the next few weeks. These instructions provide you with information about caring for yourself after your procedure. Your health care provider may also give you more specific instructions. Your treatment has been planned according to current medical practices, but problems sometimes occur. Call your health care provider if you have any problems or questions after your procedure. WHAT TO EXPECT AFTER THE PROCEDURE  After  your procedure, it is common to have soreness or tenderness at the puncture site. HOME CARE INSTRUCTIONS  Take over-the-counter and prescription medicines only as told by your health care provider.  Bathe and shower as told by your health care provider.  Follow instructions from your health  care provider about:  How to take care of your puncture site.  When and how you should change your bandage (dressing).  When you should remove your dressing.  Check your puncture site every day for signs of infection. Watch for:  Redness, swelling, or worsening pain.  Fluid, blood, or pus.  Return to your normal activities as told by your health care provider.  Keep all follow-up visits as told by your health care provider. This is important. SEEK MEDICAL CARE IF:  You have redness, swelling, or worsening pain at the site of your puncture.  You have fluid, blood, or pus coming from your puncture site.  You have a fever.  You have persistent nausea or vomiting. SEEK IMMEDIATE MEDICAL CARE IF:  You develop a rash.  You have difficulty breathing.   This information is not intended to replace advice given to you by your health care provider. Make sure you discuss any questions you have with your health care provider.   Document Released: 06/03/2005 Document Revised: 08/05/2015 Document Reviewed: 12/22/2014 Elsevier Interactive Patient Education 2016 Else[employer]  Moderate Conscious Sedation, Adult, Care After Refer to this sheet in the next few weeks. These instructions provide you with information on caring for yourself after your procedure. Your health care provider may also give you more specific instructions. Your treatment has been planned according to current medical practices, but problems sometimes occur. Call your health care provider if you have any problems or questions after your procedure. WHAT TO EXPECT AFTER THE PROCEDURE  After your procedure:  You may feel sleepy, clumsy, and have poor balance for several hours.  Vomiting may occur if you eat too soon after the procedure. HOME CARE INSTRUCTIONS  Do not participate in any activities where you could become injured for at least 24 hours. Do not:  Drive.  Swim.  Ride a bicycle.  Operate heavy  machinery.  Cook.  Use power tools.  Climb ladders.  Work from a high place.  Do not make important decisions or sign legal documents until you are improved.  If you vomit, drink water, juice, or soup when you can drink without vomiting. Make sure you have little or no nausea before eating solid foods.  Only take over-the-counter or prescription medicines for pain, discomfort, or fever as directed by your health care provider.  Make sure you and your family fully understand everything about the medicines given to you, including what side effects may occur.  You should not drink alcohol, take sleeping pills, or take medicines that cause drowsiness for at least 24 hours.  If you smoke, do not smoke without supervision.  If you are feeling better, you may resume normal activities 24 hours after you were sedated.  Keep all appointments with your health care provider. SEEK MEDICAL CARE IF:  Your skin is pale or bluish in color.  You continue to feel nauseous or vomit.  Your pain is getting worse and is not helped by medicine.  You have bleeding or swelling.  You are still sleepy or feeling clumsy after 24 hours. SEEK IMMEDIATE MEDICAL CARE IF:  You develop a rash.  You have difficulty breathing.  You develop any type of allergic problem.  You have a fever. MAKE SURE YOU:  Understand these instructions.  Will watch your condition.  Will get help right away if you are not doing well or get worse.   This information is not intended to replace advice given to you by your health care provider. Make sure you discuss any questions you have with your health care provider.   Document Released: 09/04/2013 Document Revised: 12/05/2014 Document Reviewed: 09/04/2013 Elsevier Interactive Patient Education [employer].

## 2016-07-19 NOTE — Progress Notes (Signed)
Patient ID: Warren [name], male   DOB: October 01, 1941, 75 y.o.   MRN: [id number]    Referring Physician(s): Brunetta Genera  Supervising Physician: Daryll Brod  Patient Status:  Outpatient  Chief Complaint:  "I'm getting another bone marrow biopsy"  Subjective: Patient familiar to IR service from prior bone marrow biopsy in April 2017. He has a known history of myelodysplastic syndrome and has been on chemotherapy. He presents again today for follow-up CT-guided bone marrow biopsy to assess response and prior to possible transplant. He currently denies fever, headache, chest pain, cough, abdominal/back pain, nausea, vomiting or abnormal bleeding. He does have some dyspnea with exertion.  Past Medical History:  Diagnosis Date  . Cancer [facility])    Prostate, Melanoma- lt. Shoulder (no further problems since 2010  . Complication of anesthesia    versed gave the adverse reaction pt. ,became aggitated  . COPD (chronic obstructive pulmonary disease) (HCC)    Dr. Annamaria Boots follows  . Diffuse esophageal spasm   . Diverticulosis   . DJD (degenerative joint disease)   . Multiple trauma     horse accident 2003 L SHOULDER  . Oral herpes   . OSA (obstructive sleep apnea)    mild, rare cpap use  . Peripheral neuropathy ([place])   . Pulmonary nodule   . Spinal stenosis   . Systolic hypertension    Past Surgical History:  Procedure Laterality Date  . ,laceration to head in accident with hourse    . ACOUSTIC NEUROMA LEFT EAR  1999  . BALLOON DILATION N/A 11/25/2014   Procedure: BALLOON DILATION;  Surgeon: Garlan Fair, MD;  Location: [facility];  Service: Endoscopy;  Laterality: N/A;  . COLONOSCOPY WITH PROPOFOL N/A 10/01/2013   Procedure: COLONOSCOPY WITH PROPOFOL;  Surgeon: Garlan Fair, MD;  Location: [facility] ENDOSCOPY;  Service: Endoscopy;  Laterality: N/A;  . ESOPHAGOGASTRODUODENOSCOPY (EGD) WITH PROPOFOL N/A 11/25/2014   Procedure: ESOPHAGOGASTRODUODENOSCOPY (EGD) WITH PROPOFOL;   Surgeon: Garlan Fair, MD;  Location: [facility] ENDOSCOPY;  Service: Endoscopy;  Laterality: N/A;  . EYE SURGERY     cosmetic surgery"brow lift" 4'15  . MULTIPLE PROCEDURES FOR LEFT ARM     ELBOW,RIB FX PNEUMOTHORAX AFTER FALLING OFF MOUNTAIN  . PILONIDAL CYST / SINUS EXCISION    . RADICAL PROSTATECTOMY       Allergies: Midazolam  Medications: Prior to Admission medications   Medication Sig Start Date End Date Taking? Authorizing Provider  calcium carbonate (TUMS - DOSED IN MG ELEMENTAL CALCIUM) 500 MG chewable tablet Chew 1 tablet by mouth at bedtime.    Yes Historical Provider, MD  chlorhexidine (PERIDEX) 0.12 % solution Use as directed 10 mLs in the mouth or throat 2 (two) times daily. Swish and spit. Do not swallow. Patient taking differently: Use as directed 10 mLs in the mouth or throat 2 (two) times daily as needed (thrush). Swish and spit. Do not swallow. 05/18/16  Yes Gautam Juleen China, MD  ciprofloxacin (CIPRO) 500 MG tablet Take 1 tablet (500 mg total) by mouth 2 (two) times daily. One po bid x 7 days 07/17/16  Yes Drenda Freeze, MD  omeprazole (PRILOSEC) 40 MG capsule Take 40 mg by mouth daily before lunch. 1/2 hour before.   Yes Historical Provider, MD  ondansetron (ZOFRAN) 4 MG tablet Take 4 mg by mouth every 8 (eight) hours as needed for nausea or vomiting.  05/07/16  Yes Historical Provider, MD  senna-docusate (SENNA S) 8.6-50 MG tablet Take 2 tablets by mouth 2 (two)  times daily. Patient taking differently: Take 2 tablets by mouth 2 (two) times daily as needed for mild constipation or moderate constipation.  04/06/16  Yes Brunetta Genera, MD  valACYclovir (VALTREX) 500 MG tablet Take 500 mg by mouth at bedtime. At night to suppress herpes gingivitis 09/12/13  Yes Deneise Lever, MD  valsartan (DIOVAN) 80 MG tablet Take 40 mg by mouth at bedtime.    Yes Historical Provider, MD  nitroGLYCERIN (NITROSTAT) 0.4 MG SL tablet Place 1 tablet (0.4 mg total) under the tongue  every 5 (five) minutes as needed for chest pain. 11/11/14   Sueanne Margarita, MD     Vital Signs: BP 130/70 (BP Location: Right Arm)   Pulse 73   Temp 98.1 F (36.7 C) (Oral)   Resp 18   Wt 167 lb 6.4 oz (75.9 kg)   SpO2 100%   BMI 26.22 kg/m   Physical Exam patient awake, alert. Chest clear to auscultation bilaterally. Heart with regular rate and rhythm. Abdomen soft, positive bowel sounds, nontender. Lower extremities with mild pretibial edema  Imaging: Dg Chest 2 View  Result Date: 07/17/2016 CLINICAL DATA:  Hypothermia.  Chills.  Prostate cancer.  Anemia. EXAM: CHEST  2 VIEW COMPARISON:  03/09/2016 FINDINGS: Heart size is normal. There is aortic atherosclerosis. Mediastinal shadows are otherwise normal. The lungs are clear. No effusions. Healed or healing rib fracture of the left eighth rib posterior laterally. Ordinary degenerative changes affect the thoracic spine. IMPRESSION: No active cardiopulmonary disease. Healed or healing left eighth rib fracture. Electronically Signed   By: Nelson Chimes M.D.   On: 07/17/2016 16:36    Labs:  CBC:  Recent Labs  06/29/16 1122 07/04/16 1246 07/11/16 1257 07/17/16 1630 07/17/16 1701  WBC 1.1* 1.4* 1.4* 1.4*  --   HGB 7.6* 7.5* 7.2* 7.2* 8.5*  HCT 27.0* 27.1* 25.5* 24.6* 25.0*  PLT 228 199 286 224  --     COAGS:  Recent Labs  03/21/16 0720 06/23/16 0919  INR 1.18 1.10*    BMP:  Recent Labs  06/23/16 0919 06/29/16 1122 07/11/16 1257 07/17/16 1630 07/17/16 1701  NA 141 141 140 139 141  K 4.0 4.4 4.3 4.1 4.1  CL  --   --   --  109 103  CO2 '24 27 29 27  '$ --   GLUCOSE 102 93 110 99 93  BUN 21.2 22.4 17.5 21* 21*  CALCIUM 8.4 8.5 8.6 8.3*  --   CREATININE 1.3 1.1 1.0 0.90 1.00  GFRNONAA  --   --   --  >60  --   GFRAA  --   --   --  >60  --     LIVER FUNCTION TESTS:  Recent Labs  06/23/16 0919 06/29/16 1122 07/11/16 1257 07/17/16 1630  BILITOT 1.17 1.08 1.07 1.6*  AST '20 19 23 27  '$ ALT '12 13 17 18  '$ ALKPHOS  48 47 53 43  PROT 6.1* 5.9* 6.0* 5.7*  ALBUMIN 3.4* 3.3* 3.3* 3.7    Assessment and Plan: Pt with known history of myelodysplastic syndrome and currently on chemotherapy (Vidaza); prior BM bx 02/2016. He presents again today for follow-up CT-guided bone marrow biopsy to assess response and prior to possible transplant.Risks and benefits discussed with the patient/sister including, but not limited to bleeding, infection, damage to adjacent structures or low yield requiring additional tests.All of the patient's questions were answered, patient is agreeable to proceed.Consent signed and in chart.Dr. Irene Limbo informed of pt's labs today-  may require transfusion postprocedure.     Electronically Signed: D. Rowe Robert 07/19/2016, 10:00 AM   I spent a total of 20 minutes at the the patient's bedside AND on the patient's hospital floor or unit, greater than 50% of which was counseling/coordinating care for CT guided bone marrow biopsy

## 2016-07-19 NOTE — Patient Instructions (Signed)

## 2016-07-19 NOTE — Procedures (Signed)
S/p RT ILIAC BM ASP AND CORE BX  NO COMP STABLE FULL REPORT IN PACS PATH PENDING  

## 2016-07-20 LAB — TYPE AND SCREEN
ABO/RH(D): O NEG
Antibody Screen: NEGATIVE
UNIT DIVISION: 0
Unit division: 0

## 2016-07-22 ENCOUNTER — Other Ambulatory Visit: Payer: Medicare Other

## 2016-07-22 LAB — CULTURE, BLOOD (ROUTINE X 2)
CULTURE: NO GROWTH
CULTURE: NO GROWTH

## 2016-07-25 ENCOUNTER — Encounter: Payer: Self-pay | Admitting: Hematology

## 2016-07-25 ENCOUNTER — Ambulatory Visit: Payer: Medicare Other

## 2016-07-25 ENCOUNTER — Other Ambulatory Visit: Payer: Self-pay | Admitting: *Deleted

## 2016-07-25 ENCOUNTER — Other Ambulatory Visit (HOSPITAL_BASED_OUTPATIENT_CLINIC_OR_DEPARTMENT_OTHER): Payer: Medicare Other

## 2016-07-25 ENCOUNTER — Ambulatory Visit (HOSPITAL_BASED_OUTPATIENT_CLINIC_OR_DEPARTMENT_OTHER): Payer: Medicare Other | Admitting: Hematology

## 2016-07-25 VITALS — BP 134/61 | HR 74 | Temp 98.3°F | Resp 18 | Wt 168.2 lb

## 2016-07-25 DIAGNOSIS — D4622 Refractory anemia with excess of blasts 2: Secondary | ICD-10-CM

## 2016-07-25 DIAGNOSIS — D649 Anemia, unspecified: Secondary | ICD-10-CM | POA: Diagnosis not present

## 2016-07-25 DIAGNOSIS — E559 Vitamin D deficiency, unspecified: Secondary | ICD-10-CM

## 2016-07-25 DIAGNOSIS — E611 Iron deficiency: Secondary | ICD-10-CM

## 2016-07-25 DIAGNOSIS — E785 Hyperlipidemia, unspecified: Secondary | ICD-10-CM

## 2016-07-25 DIAGNOSIS — D72819 Decreased white blood cell count, unspecified: Secondary | ICD-10-CM

## 2016-07-25 DIAGNOSIS — D709 Neutropenia, unspecified: Secondary | ICD-10-CM | POA: Diagnosis not present

## 2016-07-25 DIAGNOSIS — D469 Myelodysplastic syndrome, unspecified: Secondary | ICD-10-CM

## 2016-07-25 LAB — COMPREHENSIVE METABOLIC PANEL
ALBUMIN: 3.4 g/dL — AB (ref 3.5–5.0)
ALK PHOS: 56 U/L (ref 40–150)
ALT: 15 U/L (ref 0–55)
AST: 21 U/L (ref 5–34)
Anion Gap: 7 mEq/L (ref 3–11)
BUN: 17.9 mg/dL (ref 7.0–26.0)
CALCIUM: 8.6 mg/dL (ref 8.4–10.4)
CO2: 26 mEq/L (ref 22–29)
CREATININE: 1.1 mg/dL (ref 0.7–1.3)
Chloride: 109 mEq/L (ref 98–109)
EGFR: 66 mL/min/{1.73_m2} — ABNORMAL LOW (ref >90)
GLUCOSE: 99 mg/dL (ref 70–140)
Potassium: 4.4 mEq/L (ref 3.5–5.1)
Sodium: 142 mEq/L (ref 136–145)
Total Bilirubin: 1.63 mg/dL — ABNORMAL HIGH (ref 0.20–1.20)
Total Protein: 6.2 g/dL — ABNORMAL LOW (ref 6.4–8.3)

## 2016-07-25 LAB — CBC & DIFF AND RETIC
BASO%: 0 % (ref 0.0–2.0)
BASOS ABS: 0 10*3/uL (ref 0.0–0.1)
EOS ABS: 0 10*3/uL (ref 0.0–0.5)
EOS%: 0.9 % (ref 0.0–7.0)
HEMATOCRIT: 31.2 % — AB (ref 38.4–49.9)
HEMOGLOBIN: 8.9 g/dL — AB (ref 13.0–17.1)
IMMATURE RETIC FRACT: 30.7 % — AB (ref 3.00–10.60)
LYMPH%: 88.2 % — AB (ref 14.0–49.0)
MCH: 28.2 pg (ref 27.2–33.4)
MCHC: 28.5 g/dL — ABNORMAL LOW (ref 32.0–36.0)
MCV: 98.7 fL — AB (ref 79.3–98.0)
MONO#: 0 10*3/uL — AB (ref 0.1–0.9)
MONO%: 1.8 % (ref 0.0–14.0)
NEUT%: 9.1 % — AB (ref 39.0–75.0)
NEUTROS ABS: 0.1 10*3/uL — AB (ref 1.5–6.5)
PLATELETS: 164 10*3/uL (ref 140–400)
RBC: 3.16 10*6/uL — ABNORMAL LOW (ref 4.20–5.82)
RDW: 19.5 % — ABNORMAL HIGH (ref 11.0–14.6)
Retic %: 3.98 % — ABNORMAL HIGH (ref 0.80–1.80)
Retic Ct Abs: 125.77 10*3/uL — ABNORMAL HIGH (ref 34.80–93.90)
WBC: 1.1 10*3/uL — AB (ref 4.0–10.3)
lymph#: 1 10*3/uL (ref 0.9–3.3)

## 2016-07-25 MED ORDER — CIPROFLOXACIN HCL 500 MG PO TABS: 500.0000 mg | ORAL_TABLET | Freq: Two times a day (BID) | ORAL | 1 refills | Status: DC

## 2016-07-25 NOTE — Progress Notes (Signed)
[name]    HEMATOLOGY/ONCOLOGY CLINIC NOTE  Date of Service: .07/04/2016  Patient Care Team: Wenda Low, MD as PCP - General (Internal Medicine)  Dr [name] ([facility])  CHIEF COMPLAINTS: Follow-up for MDS  DIAGNOSIS RAEB-2 with about 15% blasts. Trisomy 8 (Intermediate risk) on FISH studies. R-ISS ( score 7- very high risk)  INTERVAL HISTORY  Warren [name] is here for his scheduled follow-up prior to his 4th cycle of Vidaza. His rash is gradually improving. Patient has no fevers or chills or night sweats. Some fatigue which is unchanged. Still having significant leukopenia with a WBC count of 1.4k and an ANC of 200. Discussed that this is likely related to his MDS cannot rule out effect of his antifungal/antibiotic. We discussed proceeding with the planned fourth cycle of Vidaza versus doing the bone marrow biopsy now and decided to proceed with his fourth cycle of Vidaza with same dose of treatment. No fevers or chills at this time. He is of the fluconazole at this time as well as the Levaquin. Continues to be in valacyclovir.    MEDICAL HISTORY:  Past Medical History:  Diagnosis Date  . Cancer [facility])    Prostate, Melanoma- lt. Shoulder (no further problems since 2010  . Complication of anesthesia    versed gave the adverse reaction pt. ,became aggitated  . COPD (chronic obstructive pulmonary disease) (HCC)    Dr. Annamaria Boots follows  . Diffuse esophageal spasm   . Diverticulosis   . DJD (degenerative joint disease)   . Multiple trauma     horse accident 2003 L SHOULDER  . Oral herpes   . OSA (obstructive sleep apnea)    mild, rare cpap use  . Peripheral neuropathy ([place])   . Pulmonary nodule   . Spinal stenosis   . Systolic hypertension   Recurrent HSV related cold sores. Acoustic neuroma left 1999 - s/p surgery. Pruritus ani Injury in 2003 related to a horse accident resulting in hydrothorax, pneumothorax, fractured ribs, dislocated left shoulder, rotator cuff tear on the  left, left elbow damage and left ulnar nerve damage.   SURGICAL HISTORY: Past Surgical History:  Procedure Laterality Date  . ,laceration to head in accident with hourse    . ACOUSTIC NEUROMA LEFT EAR  1999  . BALLOON DILATION N/A 11/25/2014   Procedure: BALLOON DILATION;  Surgeon: Garlan Fair, MD;  Location: [facility];  Service: Endoscopy;  Laterality: N/A;  . COLONOSCOPY WITH PROPOFOL N/A 10/01/2013   Procedure: COLONOSCOPY WITH PROPOFOL;  Surgeon: Garlan Fair, MD;  Location: [facility];  Service: Endoscopy;  Laterality: N/A;  . ESOPHAGOGASTRODUODENOSCOPY (EGD) WITH PROPOFOL N/A 11/25/2014   Procedure: ESOPHAGOGASTRODUODENOSCOPY (EGD) WITH PROPOFOL;  Surgeon: Garlan Fair, MD;  Location: [facility];  Service: Endoscopy;  Laterality: N/A;  . EYE SURGERY     cosmetic surgery"brow lift" 4'15  . MULTIPLE PROCEDURES FOR LEFT ARM     ELBOW,RIB FX PNEUMOTHORAX AFTER FALLING OFF MOUNTAIN  . PILONIDAL CYST / SINUS EXCISION    . RADICAL PROSTATECTOMY      SOCIAL HISTORY: Social History   Social History  . Marital status: Married    Spouse name: N/A  . Number of children: N/A  . Years of education: N/A   Occupational History  . Not on file.   Social History Main Topics  . Smoking status: Former Smoker    Packs/day: 1.00    Years: 40.00    Quit date: 11/29/1995  . Smokeless tobacco: Never Used  . Alcohol use  Yes     Comment: 1 drink per day  . Drug use: No  . Sexual activity: Not on file   Other Topics Concern  . Not on file   Social History Narrative   CPAP 8 advanced   Patient drinks 5 cups of caffeine daily   Exercises 3-4 times weekly   Son is a pulmonologist    FAMILY HISTORY: Family History  Problem Relation Age of Onset  . Emphysema Father   . Asthma Father   . Lung cancer Father   . Breast cancer Sister   . Breast cancer Sister   . Skin cancer Brother   . Prostate cancer Brother    Brother with a recent diagnosis of  hemachromatosis Brother with CLL/SLL Mother had anemia, thalassemia. Hepatitis C with liver cancer  ALLERGIES:  is allergic to midazolam.  MEDICATIONS:  Current Outpatient Prescriptions  Medication Sig Dispense Refill  . calcium carbonate (TUMS - DOSED IN MG ELEMENTAL CALCIUM) 500 MG chewable tablet Chew 1 tablet by mouth at bedtime.     . chlorhexidine (PERIDEX) 0.12 % solution Use as directed 10 mLs in the mouth or throat 2 (two) times daily. Swish and spit. Do not swallow. (Patient taking differently: Use as directed 10 mLs in the mouth or throat 2 (two) times daily as needed (thrush). Swish and spit. Do not swallow.) 473 mL 1  . ciprofloxacin (CIPRO) 500 MG tablet Take 1 tablet (500 mg total) by mouth 2 (two) times daily. One po bid x 7 days 14 tablet 0  . nitroGLYCERIN (NITROSTAT) 0.4 MG SL tablet Place 1 tablet (0.4 mg total) under the tongue every 5 (five) minutes as needed for chest pain. 25 tablet 1  . omeprazole (PRILOSEC) 40 MG capsule Take 40 mg by mouth daily before lunch. 1/2 hour before.    . ondansetron (ZOFRAN) 4 MG tablet Take 4 mg by mouth every 8 (eight) hours as needed for nausea or vomiting.     . senna-docusate (SENNA S) 8.6-50 MG tablet Take 2 tablets by mouth 2 (two) times daily. (Patient taking differently: Take 2 tablets by mouth 2 (two) times daily as needed for mild constipation or moderate constipation. ) 120 tablet 2  . valACYclovir (VALTREX) 500 MG tablet Take 500 mg by mouth at bedtime. At night to suppress herpes gingivitis    . valsartan (DIOVAN) 80 MG tablet Take 40 mg by mouth at bedtime.      No current facility-administered medications for this visit.     REVIEW OF SYSTEMS:    10 Point review of Systems was done is negative except as noted above.  PHYSICAL EXAMINATION: ECOG PERFORMANCE STATUS: 1 - Symptomatic but completely ambulatory  . Vitals:   07/04/16 1259  BP: (!) 127/57  Pulse: 76  Resp: 18  Temp: 98.1 F (36.7 C)   Filed Weights    07/04/16 1259  Weight: 168 lb (76.2 kg)   .Body mass index is 26.31 kg/m.  GENERAL:alert, in no acute distress and comfortable SKIN: Resolving rash on his upper extremities EYES: normal, conjunctiva are pink and non-injected, sclera clear OROPHARYNX:no exudate, no erythema and lips, buccal mucosa, and tongue normal  NECK: supple, no JVD, thyroid normal size, non-tender, without nodularity LYMPH:  no palpable lymphadenopathy in the cervical, axillary or inguinal LUNGS: clear to auscultation With somewhat decreased entry bilaterally, no wheezing HEART: regular rate & rhythm,  no murmurs and no lower extremity edema ABDOMEN: abdomen soft, non-tender, normoactive bowel sounds , no palpable  hepatosplenomegaly Musculoskeletal: no cyanosis of digits. PSYCH: alert & oriented x 3 with fluent speech NEURO: no focal motor deficits.  LABORATORY DATA:  I have reviewed the data as listed  Component     Latest Ref Rng & Units 06/29/2016 07/04/2016  WBC     4.0 - 10.3 10e3/uL 1.1 (L) 1.4 (L)  NEUT#     1.5 - 6.5 10e3/uL 0.1 (LL) 0.2 (LL)  Hemoglobin     13.0 - 17.1 g/dL 7.6 (L) 7.5 (L)  HCT     38.4 - 49.9 % 27.0 (L) 27.1 (L)  Platelets     140 - 400 10e3/uL 228 199  MCV     79.3 - 98.0 fL 99.6 (H) 100.0 (H)  MCH     27.2 - 33.4 pg 28.0 27.7  MCHC     32.0 - 36.0 g/dL 28.1 (L) 27.7 (L)  RBC     4.20 - 5.82 10e6/uL 2.71 (L) 2.71 (L)  RDW     11.0 - 14.6 % 18.8 (H) 18.2 (H)  lymph#     0.9 - 3.3 10e3/uL 0.9 1.1  MONO#     0.1 - 0.9 10e3/uL 0.0 (L) 0.1  Eosinophils Absolute     0.0 - 0.5 10e3/uL 0.0 0.0  Basophils Absolute     0.0 - 0.1 10e3/uL 0.0 0.0  NEUT%     39.0 - 75.0 % 8.5 (L) 11.1 (L)  LYMPH%     14.0 - 49.0 % 87.7 (H) 83.1 (H)  MONO%     0.0 - 14.0 % 1.9 5.1  EOS%     0.0 - 7.0 % 1.9 0.7  BASO%     0.0 - 2.0 % 0.0 0.0  Retic %     0.80 - 1.80 % 3.71 (H) 3.29 (H)  Retic Ct Abs     34.80 - 93.90 10e3/uL 100.54 (H) 89.16  Immature Retic Fract     3.00 - 10.60 %  24.40 (H) 27.50 (H)  Sodium     136 - 145 mEq/L 141   Potassium     3.5 - 5.1 mEq/L 4.4   Chloride     98 - 109 mEq/L 109   CO2     22 - 29 mEq/L 27   Glucose     70 - 140 mg/dl 93   BUN     7.0 - 26.0 mg/dL 22.4   Creatinine     0.7 - 1.3 mg/dL 1.1   Total Bilirubin     0.20 - 1.20 mg/dL 1.08   Alkaline Phosphatase     40 - 150 U/L 47   AST     5 - 34 U/L 19   ALT     0 - 55 U/L 13   Total Protein     6.4 - 8.3 g/dL 5.9 (L)   Albumin     3.5 - 5.0 g/dL 3.3 (L)   Calcium     8.4 - 10.4 mg/dL 8.5   Anion gap     3 - 11 mEq/L 5   EGFR     >90 ml/min/1.73 m2 62 (L)      Erythropoietin: 92.6  RADIOGRAPHIC STUDIES: I have personally reviewed the radiological images as listed and agreed with the findings in the report. Dg Chest 2 View  Result Date: 07/17/2016 CLINICAL DATA:  Hypothermia.  Chills.  Prostate cancer.  Anemia. EXAM: CHEST  2 VIEW COMPARISON:  03/09/2016 FINDINGS: Heart size is normal. There is  aortic atherosclerosis. Mediastinal shadows are otherwise normal. The lungs are clear. No effusions. Healed or healing rib fracture of the left eighth rib posterior laterally. Ordinary degenerative changes affect the thoracic spine. IMPRESSION: No active cardiopulmonary disease. Healed or healing left eighth rib fracture. Electronically Signed   By: Nelson Chimes M.D.   On: 07/17/2016 16:36   Ct Biopsy  Result Date: 07/19/2016 INDICATION: REFRACTORY ANEMIA, MYELODYSPLASTIC SYNDROME EXAM: CT GUIDED RIGHT ILIAC BONE MARROW ASPIRATION AND CORE BIOPSY Date:  8/22/20178/22/2017 11:49 am Radiologist:  M. Daryll Brod, MD Guidance:  CT FLUOROSCOPY TIME:  Fluoroscopy Time: None. MEDICATIONS: None. ANESTHESIA/SEDATION: 125 Mcg IV Fentanyl Moderate Sedation Time:  13 minutes The patient was continuously monitored during the procedure by the interventional radiology nurse under my direct supervision. CONTRAST:  None. COMPLICATIONS: None PROCEDURE: Informed consent was obtained from the  patient following explanation of the procedure, risks, benefits and alternatives. The patient understands, agrees and consents for the procedure. All questions were addressed. A time out was performed. The patient was positioned prone and non-contrast localization CT was performed of the pelvis to demonstrate the iliac marrow spaces. Maximal barrier sterile technique utilized including caps, mask, sterile gowns, sterile gloves, large sterile drape, hand hygiene, and Betadine prep. Under sterile conditions and local anesthesia, an 11 gauge coaxial bone biopsy needle was advanced into the right iliac marrow space. Needle position was confirmed with CT imaging. Initially, bone marrow aspiration was performed. Next, the 11 gauge outer cannula was utilized to obtain a right iliac bone marrow core biopsy. Needle was removed. Hemostasis was obtained with compression. The patient tolerated the procedure well. Samples were prepared with the cytotechnologist. No immediate complications. IMPRESSION: CT guided right iliac bone marrow aspiration and core biopsy. Electronically Signed   By: Jerilynn Mages.  Shick M.D.   On: 07/19/2016 12:17    ASSESSMENT & PLAN:   75 year old Caucasian male with  1) myelodysplastic syndrome (RAEB-2 with about 14-15% blasts) ISS-R score of 7 (Very High Risk) Cytogenetics show trisomy 8 (intermediate risk ) Presented with Subacute Anemia (with high normal MCV) + leukopenia/Neutropenia - new since September 2016. Patient has normal platelet counts at this time. Patient has not received any additional PRBC transfusions since his last clinic visit. 2) Anemia - hemoglobin somewhat down trending to 7.5 from previous range of 8-9 range  . Some symptoms but not significant at this time .remained stable. Patient does not feel he needs a PRBC transfusion at this time.  3) Neutropenia no fevers. No evidence of infection. [place] appeared to be improving to 500 prior to starting the fluconazole and then the  patient's [place] appears to drop back down to 100 and he seems to have developed somewhat erythematous bruising like appearing rash with a few darker spots - this could possibly be due to his fluconazole and that is the only new temporally related medication and is more likely than Levaquin to cause neutropenia. His fluconazole has been held and his rash is better but he still continues to have a low ANC of 200. We discussed this is likely disease related and could partly be from the [place]. We discussed options of getting a bone marrow biopsy now versus proceeding with his planned fourth cycle of Vidaza and then getting his bone marrow biopsy after that.  4) Slightly elevated bilirubin level even before treatment (?GIlbert's) - normal today 5) Mild iron deficiency ferritin 16-22 Received IV Feraheme on 05/02/2016. Iron deficiency corrected  Plan -After extensive discussion we have decided to proceed  with the fourth cycle of Vidaza. -He will be set up for a bone marrow examination about 3 weeks after his day 1 of his fourth cycle Vidaza. -Continue strict adherence to neutropenic precautions seek immediate help for any fevers/chills or signs of infection. -Transfuse PRBCs (CMV neg and irradiated) when necessary for symptomatic anemia or if hemoglobin less than 7. We will use it irradiated and CMV negative products only. -Foundation one results reviewed patient has TET2, ASXL1, BCOR RUNX1 SETBP1 SRSF2 and STAG2 mutations identified. -Continue weekly CBC --continue follow up with [facility] for his bone marrow transplant needs and did have an initial meeting with the transplant team. (Is being seen by Dr Royce Macadamia and Dr Ok Edwards).  --A bone marrow biopsy shows good response might have the option continue Vidaza at this time. With suboptimal response or progression might need to consider transplant now versus one of the clinical trials recommended by Dr. Royce Macadamia  Return to care with Dr. Irene Limbo  In 3 weeks with BM  Bx results to determine further treatment options.  All of the patients and his wife's  questions were answered to their apparent satisfaction. The patient knows to call the clinic with any problems, questions or concerns.  I spent 25 minutes counseling the patient face to face. The total time spent in the appointment was 25 minutes and more than 50% was on counseling and direct patient cares.    Warren Lone MD [place] AAHIVMS [facility] Hematology/Oncology Physician [facility]  (Office):       [phone] (Work cell):  [phone] (Fax):           [phone]

## 2016-07-26 ENCOUNTER — Ambulatory Visit: Payer: Medicare Other

## 2016-07-27 ENCOUNTER — Ambulatory Visit: Payer: Medicare Other

## 2016-07-27 LAB — TISSUE HYBRIDIZATION (BONE MARROW)-NCBH

## 2016-07-27 LAB — CHROMOSOME ANALYSIS, BONE MARROW

## 2016-07-28 ENCOUNTER — Ambulatory Visit: Payer: Medicare Other

## 2016-07-29 ENCOUNTER — Ambulatory Visit: Payer: Medicare Other

## 2016-07-29 DIAGNOSIS — Z803 Family history of malignant neoplasm of breast: Secondary | ICD-10-CM | POA: Diagnosis not present

## 2016-07-29 DIAGNOSIS — Z801 Family history of malignant neoplasm of trachea, bronchus and lung: Secondary | ICD-10-CM | POA: Diagnosis not present

## 2016-07-29 DIAGNOSIS — D46Z Other myelodysplastic syndromes: Secondary | ICD-10-CM | POA: Diagnosis not present

## 2016-07-29 DIAGNOSIS — Z79899 Other long term (current) drug therapy: Secondary | ICD-10-CM | POA: Diagnosis not present

## 2016-07-29 DIAGNOSIS — Z7982 Long term (current) use of aspirin: Secondary | ICD-10-CM | POA: Diagnosis not present

## 2016-07-29 DIAGNOSIS — Z8042 Family history of malignant neoplasm of prostate: Secondary | ICD-10-CM | POA: Diagnosis not present

## 2016-07-29 DIAGNOSIS — Z9221 Personal history of antineoplastic chemotherapy: Secondary | ICD-10-CM | POA: Diagnosis not present

## 2016-07-29 DIAGNOSIS — Z87891 Personal history of nicotine dependence: Secondary | ICD-10-CM | POA: Diagnosis not present

## 2016-07-29 DIAGNOSIS — Z005 Encounter for examination of potential donor of organ and tissue: Secondary | ICD-10-CM | POA: Diagnosis not present

## 2016-07-29 DIAGNOSIS — Z6826 Body mass index (BMI) 26.0-26.9, adult: Secondary | ICD-10-CM | POA: Diagnosis not present

## 2016-07-29 DIAGNOSIS — D469 Myelodysplastic syndrome, unspecified: Secondary | ICD-10-CM | POA: Diagnosis not present

## 2016-07-29 DIAGNOSIS — I10 Essential (primary) hypertension: Secondary | ICD-10-CM | POA: Diagnosis not present

## 2016-07-29 DIAGNOSIS — Z808 Family history of malignant neoplasm of other organs or systems: Secondary | ICD-10-CM | POA: Diagnosis not present

## 2016-07-29 DIAGNOSIS — G4733 Obstructive sleep apnea (adult) (pediatric): Secondary | ICD-10-CM | POA: Diagnosis not present

## 2016-07-31 ENCOUNTER — Telehealth: Payer: Self-pay | Admitting: Hematology

## 2016-07-31 NOTE — Telephone Encounter (Signed)
Per 8/28 los cxd upcoming tx cycle (9/5 - 9/13) and added lab/fu for 911. Left message for patient and mailed. Schedule.

## 2016-08-02 ENCOUNTER — Other Ambulatory Visit: Payer: Medicare Other

## 2016-08-02 ENCOUNTER — Ambulatory Visit (HOSPITAL_BASED_OUTPATIENT_CLINIC_OR_DEPARTMENT_OTHER): Payer: Medicare Other

## 2016-08-02 ENCOUNTER — Other Ambulatory Visit: Payer: Self-pay | Admitting: *Deleted

## 2016-08-02 ENCOUNTER — Other Ambulatory Visit (HOSPITAL_BASED_OUTPATIENT_CLINIC_OR_DEPARTMENT_OTHER): Payer: Medicare Other

## 2016-08-02 ENCOUNTER — Other Ambulatory Visit: Payer: Self-pay | Admitting: Hematology

## 2016-08-02 ENCOUNTER — Ambulatory Visit: Payer: Medicare Other

## 2016-08-02 VITALS — BP 101/68 | HR 68 | Temp 98.2°F | Resp 18

## 2016-08-02 DIAGNOSIS — D469 Myelodysplastic syndrome, unspecified: Secondary | ICD-10-CM

## 2016-08-02 DIAGNOSIS — D4622 Refractory anemia with excess of blasts 2: Secondary | ICD-10-CM | POA: Diagnosis present

## 2016-08-02 LAB — CBC & DIFF AND RETIC
BASO%: 0 % (ref 0.0–2.0)
Basophils Absolute: 0 10*3/uL (ref 0.0–0.1)
EOS%: 1 % (ref 0.0–7.0)
Eosinophils Absolute: 0 10*3/uL (ref 0.0–0.5)
HCT: 30.1 % — ABNORMAL LOW (ref 38.4–49.9)
HGB: 8.8 g/dL — ABNORMAL LOW (ref 13.0–17.1)
IMMATURE RETIC FRACT: 20.1 % — AB (ref 3.00–10.60)
LYMPH%: 88.5 % — ABNORMAL HIGH (ref 14.0–49.0)
MCH: 28.2 pg (ref 27.2–33.4)
MCHC: 29.4 g/dL — AB (ref 32.0–36.0)
MCV: 95.9 fL (ref 79.3–98.0)
MONO#: 0 10*3/uL — AB (ref 0.1–0.9)
MONO%: 2.8 % (ref 0.0–14.0)
NEUT%: 7.7 % — AB (ref 39.0–75.0)
NEUTROS ABS: 0.1 10*3/uL — AB (ref 1.5–6.5)
PLATELETS: 164 10*3/uL (ref 140–400)
RBC: 3.13 10*6/uL — AB (ref 4.20–5.82)
RDW: 19.5 % — ABNORMAL HIGH (ref 11.0–14.6)
RETIC CT ABS: 102.98 10*3/uL — AB (ref 34.80–93.90)
Retic %: 3.29 % — ABNORMAL HIGH (ref 0.80–1.80)
WBC: 1 10*3/uL — ABNORMAL LOW (ref 4.0–10.3)
lymph#: 0.9 10*3/uL (ref 0.9–3.3)

## 2016-08-02 LAB — COMPREHENSIVE METABOLIC PANEL
ALT: 12 U/L (ref 0–55)
ANION GAP: 6 meq/L (ref 3–11)
AST: 21 U/L (ref 5–34)
Albumin: 3.2 g/dL — ABNORMAL LOW (ref 3.5–5.0)
Alkaline Phosphatase: 48 U/L (ref 40–150)
BUN: 19.6 mg/dL (ref 7.0–26.0)
CALCIUM: 8.3 mg/dL — AB (ref 8.4–10.4)
CHLORIDE: 109 meq/L (ref 98–109)
CO2: 26 meq/L (ref 22–29)
Creatinine: 1 mg/dL (ref 0.7–1.3)
EGFR: 72 mL/min/{1.73_m2} — ABNORMAL LOW (ref >90)
Glucose: 116 mg/dl (ref 70–140)
POTASSIUM: 4.5 meq/L (ref 3.5–5.1)
Sodium: 141 mEq/L (ref 136–145)
Total Bilirubin: 1.56 mg/dL — ABNORMAL HIGH (ref 0.20–1.20)
Total Protein: 6 g/dL — ABNORMAL LOW (ref 6.4–8.3)

## 2016-08-02 LAB — TECHNOLOGIST REVIEW

## 2016-08-02 MED ORDER — AZACITIDINE CHEMO SQ INJECTION
76.0000 mg/m2 | Freq: Once | INTRAMUSCULAR | Status: AC
  Administered 2016-08-02: 145 mg via SUBCUTANEOUS
  Filled 2016-08-02: qty 5.8

## 2016-08-02 MED ORDER — ONDANSETRON HCL 8 MG PO TABS: 8.0000 mg | ORAL_TABLET | Freq: Once | ORAL | Status: DC

## 2016-08-02 NOTE — Patient Instructions (Signed)
[facility] Discharge Instructions for Patients Receiving Chemotherapy  Today you received the following chemotherapy agents Vidaza  To help prevent nausea and vomiting after your treatment, we encourage you to take your nausea medication as prescribed.    If you develop nausea and vomiting that is not controlled by your nausea medication, call the clinic.   BELOW ARE SYMPTOMS THAT SHOULD BE REPORTED IMMEDIATELY:  *FEVER GREATER THAN 100.5 F  *CHILLS WITH OR WITHOUT FEVER  NAUSEA AND VOMITING THAT IS NOT CONTROLLED WITH YOUR NAUSEA MEDICATION  *UNUSUAL SHORTNESS OF BREATH  *UNUSUAL BRUISING OR BLEEDING  TENDERNESS IN MOUTH AND THROAT WITH OR WITHOUT PRESENCE OF ULCERS  *URINARY PROBLEMS  *BOWEL PROBLEMS  UNUSUAL RASH Items with * indicate a potential emergency and should be followed up as soon as possible.  Feel free to call the clinic you have any questions or concerns. The clinic phone number is [phone].  Please show the CHEMO ALERT CARD at check-in to the Emergency Department and triage nurse.   

## 2016-08-02 NOTE — Progress Notes (Signed)
Okay to treat with CBC results today per Dr. Irene Limbo.

## 2016-08-02 NOTE — Progress Notes (Signed)
Warren [name] Kitchen    HEMATOLOGY/ONCOLOGY CLINIC NOTE  Date of Service: .07/25/2016  Patient Care Team: Wenda Low, MD as PCP - General (Internal Medicine)  Dr [name] ([facility])  CHIEF COMPLAINTS: Follow-up for MDS  DIAGNOSIS RAEB-2 with about 15% blasts. Trisomy 8 (Intermediate risk) on FISH studies. R-ISS ( score 7- very high risk)  INTERVAL HISTORY  Warren [name] is here for his scheduled follow-up  and had his planned bone marrow examination after 4 cycles of Vidaza. Bone marrow examination still shows 10-15% blasts (7% myeloblasts on flow cytometry). Patient continues to be significantly leukopenic/neutropenic  With an [place] of 0.1k. (With 60-70% bone marrow cellularity). The results of the bone marrow examination was discussed in detail with Warren. [name] and his wife. He has a follow-up appointment with Dr. Ok Edwards shortly for his input after review of the bone marrow examination findings as well. Had some chills last week and was placed on ciprofloxacin. No focal symptoms of infection at this time no further fevers or chills. Regular continuing his ciprofloxacin. His rash on the left forearm is nearly resolved and the one on the right forearm is much better. He feels much better after the blood transfusion and his hemoglobin is up to 8.9 post transfusion today.   MEDICAL HISTORY:  Past Medical History:  Diagnosis Date  . Cancer [facility])    Prostate, Melanoma- lt. Shoulder (no further problems since 2010  . Complication of anesthesia    versed gave the adverse reaction pt. ,became aggitated  . COPD (chronic obstructive pulmonary disease) (HCC)    Dr. Annamaria Boots follows  . Diffuse esophageal spasm   . Diverticulosis   . DJD (degenerative joint disease)   . Multiple trauma     horse accident 2003 L SHOULDER  . Oral herpes   . OSA (obstructive sleep apnea)    mild, rare cpap use  . Peripheral neuropathy ([place])   . Pulmonary nodule   . Spinal stenosis   . Systolic hypertension   Recurrent HSV  related cold sores. Acoustic neuroma left 1999 - s/p surgery. Pruritus ani Injury in 2003 related to a horse accident resulting in hydrothorax, pneumothorax, fractured ribs, dislocated left shoulder, rotator cuff tear on the left, left elbow damage and left ulnar nerve damage.   SURGICAL HISTORY: Past Surgical History:  Procedure Laterality Date  . ,laceration to head in accident with hourse    . ACOUSTIC NEUROMA LEFT EAR  1999  . BALLOON DILATION N/A 11/25/2014   Procedure: BALLOON DILATION;  Surgeon: Garlan Fair, MD;  Location: [facility];  Service: Endoscopy;  Laterality: N/A;  . COLONOSCOPY WITH PROPOFOL N/A 10/01/2013   Procedure: COLONOSCOPY WITH PROPOFOL;  Surgeon: Garlan Fair, MD;  Location: [facility];  Service: Endoscopy;  Laterality: N/A;  . ESOPHAGOGASTRODUODENOSCOPY (EGD) WITH PROPOFOL N/A 11/25/2014   Procedure: ESOPHAGOGASTRODUODENOSCOPY (EGD) WITH PROPOFOL;  Surgeon: Garlan Fair, MD;  Location: [facility];  Service: Endoscopy;  Laterality: N/A;  . EYE SURGERY     cosmetic surgery"brow lift" 4'15  . MULTIPLE PROCEDURES FOR LEFT ARM     ELBOW,RIB FX PNEUMOTHORAX AFTER FALLING OFF MOUNTAIN  . PILONIDAL CYST / SINUS EXCISION    . RADICAL PROSTATECTOMY      SOCIAL HISTORY: Social History   Social History  . Marital status: Married    Spouse name: N/A  . Number of children: N/A  . Years of education: N/A   Occupational History  . Not on file.   Social History Main Topics  .  Smoking status: Former Smoker    Packs/day: 1.00    Years: 40.00    Quit date: 11/29/1995  . Smokeless tobacco: Never Used  . Alcohol use Yes     Comment: 1 drink per day  . Drug use: No  . Sexual activity: Not on file   Other Topics Concern  . Not on file   Social History Narrative   CPAP 8 advanced   Patient drinks 5 cups of caffeine daily   Exercises 3-4 times weekly   Son is a pulmonologist    FAMILY HISTORY: Family History  Problem Relation Age of Onset    . Emphysema Father   . Asthma Father   . Lung cancer Father   . Breast cancer Sister   . Breast cancer Sister   . Skin cancer Brother   . Prostate cancer Brother    Brother with a recent diagnosis of hemachromatosis Brother with CLL/SLL Mother had anemia, thalassemia. Hepatitis C with liver cancer  ALLERGIES:  is allergic to midazolam.  MEDICATIONS:  Current Outpatient Prescriptions  Medication Sig Dispense Refill  . calcium carbonate (TUMS - DOSED IN MG ELEMENTAL CALCIUM) 500 MG chewable tablet Chew 1 tablet by mouth at bedtime.     . chlorhexidine (PERIDEX) 0.12 % solution Use as directed 10 mLs in the mouth or throat 2 (two) times daily. Swish and spit. Do not swallow. (Patient taking differently: Use as directed 10 mLs in the mouth or throat 2 (two) times daily as needed (thrush). Swish and spit. Do not swallow.) 473 mL 1  . nitroGLYCERIN (NITROSTAT) 0.4 MG SL tablet Place 1 tablet (0.4 mg total) under the tongue every 5 (five) minutes as needed for chest pain. 25 tablet 1  . omeprazole (PRILOSEC) 40 MG capsule Take 40 mg by mouth daily before lunch. 1/2 hour before.    . ondansetron (ZOFRAN) 4 MG tablet Take 4 mg by mouth every 8 (eight) hours as needed for nausea or vomiting.     . senna-docusate (SENNA S) 8.6-50 MG tablet Take 2 tablets by mouth 2 (two) times daily. (Patient taking differently: Take 2 tablets by mouth 2 (two) times daily as needed for mild constipation or moderate constipation. ) 120 tablet 2  . valACYclovir (VALTREX) 500 MG tablet Take 500 mg by mouth at bedtime. At night to suppress herpes gingivitis    . valsartan (DIOVAN) 80 MG tablet Take 40 mg by mouth at bedtime.     . ciprofloxacin (CIPRO) 500 MG tablet Take 1 tablet (500 mg total) by mouth 2 (two) times daily. 60 tablet 1   No current facility-administered medications for this visit.     REVIEW OF SYSTEMS:    10 Point review of Systems was done is negative except as noted above.  PHYSICAL  EXAMINATION: ECOG PERFORMANCE STATUS: 1 - Symptomatic but completely ambulatory  . Vitals:   07/25/16 1305  BP: 134/61  Pulse: 74  Resp: 18  Temp: 98.3 F (36.8 C)   Filed Weights   07/25/16 1305  Weight: 168 lb 3.2 oz (76.3 kg)   .Body mass index is 26.34 kg/m.  GENERAL:alert, in no acute distress and comfortable SKIN: Resolving rash on his upper extremities EYES: normal, conjunctiva are pink and non-injected, sclera clear OROPHARYNX:no exudate, no erythema and lips, buccal mucosa, and tongue normal  NECK: supple, no JVD, thyroid normal size, non-tender, without nodularity LYMPH:  no palpable lymphadenopathy in the cervical, axillary or inguinal LUNGS: clear to auscultation With somewhat decreased entry  bilaterally, no wheezing HEART: regular rate & rhythm,  no murmurs and no lower extremity edema ABDOMEN: abdomen soft, non-tender, normoactive bowel sounds , no palpable hepatosplenomegaly Musculoskeletal: no cyanosis of digits. PSYCH: alert & oriented x 3 with fluent speech NEURO: no focal motor deficits.  LABORATORY DATA:  I have reviewed the data as listed  . CBC Latest Ref Rng & Units 07/25/2016 07/19/2016 07/17/2016  WBC 4.0 - 10.3 10e3/uL 1.1(L) 0.9(LL) -  Hemoglobin 13.0 - 17.1 g/dL 8.9(L) 6.9(LL) 8.5(L)  Hematocrit 38.4 - 49.9 % 31.2(L) 24.0(L) 25.0(L)  Platelets 140 - 400 10e3/uL 164 187 -   . CMP Latest Ref Rng & Units 07/25/2016 07/17/2016 07/17/2016  Glucose 70 - 140 mg/dl 99 93 99  BUN 7.0 - 26.0 mg/dL 17.9 21(H) 21(H)  Creatinine 0.7 - 1.3 mg/dL 1.1 1.00 0.90  Sodium 136 - 145 mEq/L 142 141 139  Potassium 3.5 - 5.1 mEq/L 4.4 4.1 4.1  Chloride 101 - 111 mmol/L - 103 109  CO2 22 - 29 mEq/L 26 - 27  Calcium 8.4 - 10.4 mg/dL 8.6 - 8.3(L)  Total Protein 6.4 - 8.3 g/dL 6.2(L) - 5.7(L)  Total Bilirubin 0.20 - 1.20 mg/dL 1.63(H) - 1.6(H)  Alkaline Phos 40 - 150 U/L 56 - 43  AST 5 - 34 U/L 21 - 27  ALT 0 - 55 U/L 15 - 18       GROSS AND MICROSCOPIC  INFORMATION Specimen Clinical Information MDS [rd] Source Bone Marrow, Aspirate,Biopsy, and Clot, right iliac and core Microscopic LAB DATA: CBC performed on 07/19/2016 shows: WBC 0.9 K/ul Neutrophils 4% HB 6.9 g/dl Lymphocytes 91% HCT 24.0 % Monocytes 4% MCV 98.4 fL Eosinophils 1% RDW 19.0 % Basophils 0% PLT 187 K/ul PERIPHERAL BLOOD SMEAR: The red blood cells display moderate anisocytosis with macrocytic and normocytic cells. There is mild to moderate poikilocytosis with microspherocytes, elliptocytes, target cells, and tear drop cells. There is prominent polychromasia. The white blood cells are decreased in number with marked neutropenia and relative abundance of small lymphocytes. A rare large blastic cell is seen on scan. No Auer rods are identified. The platelets 1 of 3 FINAL for Vieyra, Vega H ([id number]) Microscopic(continued) are normal in number. BONE MARROW ASPIRATE: Erythroid precursors: Progressive maturation with scattered cells displaying nuclear cytoplasmic dyssynchrony or irregular nuclei. Granulocytic precursors: Left shifted maturation with increased number of blastic cells estimated at 10% of all cells. The blasts are medium to large in size with round to slightly irregular nuclei, high nuclear cytoplasmic ratio, fine chromatin and small to inconspicuous nucleoli. No Auer rods are identified. Maturing neutrophilic cells are decreased but scattered cells display hypogranulation and/or abnormal lobation. Megakaryocytes: Abundant with many small hypolobated forms, forms with separate nuclear lobes or large atypical forms. Lymphocytes/plasma cells: Large aggregates not present. TOUCH PREPARATIONS: A mixture of myeloid cell types with scattering of blastic cells. CLOT and BIOPSY: The sections show 60 to 70% cellularity with a mixture of myeloid cell types but with relative abundance of erythroid precursors and conspicuous presence of immature mononuclear cells.  Megakaryocytes are also abundant with many abnormal forms. Maturing neutrophilic cells appear decreased in number. Immunohistochemical stains for CD34, CD117, E-cadherin and myeloperoxidase were performed with appropriate controls. CD34 highlights increased number of positive mononuclear cells consisting of interstitial cells and scattering of small clusters composed of few cells. The positivity is estimated at 10 to 15% of all cells. CD117 highlights an increased number of positive mononuclear cells with interstitial cells and numerous variably sized but  predominantly small clusters representing early granulocytic and/or erythroid precursors. Myeloperoxidase highlights the granulocytic component including immature mononuclear cells and E-cadherin highlights the erythroid component consisting of numerous variably sized clusters. IRON STAIN: Iron stains are performed on a bone marrow aspirate smear and section of clot. The controls stained appropriately. Storage Iron: Scanty. Ringed Sideroblasts: Absent. ADDITIONAL DATA / TESTING: Flow cytometric analysis ([id number]) shows a slight increase in blastic cells representing an estimated 7% of all cells with expression of HLA-DR, CD13, CD33, CD34, CD117 and myeloperoxidase consistent with myeloid phenotype. The specimen was sent for cytogenetic analysis and FISH for MDS and a separate report will follow. (BNS:ecj 07/21/2016)   Erythropoietin: 92.6  RADIOGRAPHIC STUDIES: I have personally reviewed the radiological images as listed and agreed with the findings in the report. Dg Chest 2 View  Result Date: 07/17/2016 CLINICAL DATA:  Hypothermia.  Chills.  Prostate cancer.  Anemia. EXAM: CHEST  2 VIEW COMPARISON:  03/09/2016 FINDINGS: Heart size is normal. There is aortic atherosclerosis. Mediastinal shadows are otherwise normal. The lungs are clear. No effusions. Healed or healing rib fracture of the left eighth rib posterior laterally. Ordinary  degenerative changes affect the thoracic spine. IMPRESSION: No active cardiopulmonary disease. Healed or healing left eighth rib fracture. Electronically Signed   By: Nelson Chimes M.D.   On: 07/17/2016 16:36   Ct Biopsy  Result Date: 07/19/2016 INDICATION: REFRACTORY ANEMIA, MYELODYSPLASTIC SYNDROME EXAM: CT GUIDED RIGHT ILIAC BONE MARROW ASPIRATION AND CORE BIOPSY Date:  8/22/20178/22/2017 11:49 am Radiologist:  M. Daryll Brod, MD Guidance:  CT FLUOROSCOPY TIME:  Fluoroscopy Time: None. MEDICATIONS: None. ANESTHESIA/SEDATION: 125 Mcg IV Fentanyl Moderate Sedation Time:  13 minutes The patient was continuously monitored during the procedure by the interventional radiology nurse under my direct supervision. CONTRAST:  None. COMPLICATIONS: None PROCEDURE: Informed consent was obtained from the patient following explanation of the procedure, risks, benefits and alternatives. The patient understands, agrees and consents for the procedure. All questions were addressed. A time out was performed. The patient was positioned prone and non-contrast localization CT was performed of the pelvis to demonstrate the iliac marrow spaces. Maximal barrier sterile technique utilized including caps, mask, sterile gowns, sterile gloves, large sterile drape, hand hygiene, and Betadine prep. Under sterile conditions and local anesthesia, an 11 gauge coaxial bone biopsy needle was advanced into the right iliac marrow space. Needle position was confirmed with CT imaging. Initially, bone marrow aspiration was performed. Next, the 11 gauge outer cannula was utilized to obtain a right iliac bone marrow core biopsy. Needle was removed. Hemostasis was obtained with compression. The patient tolerated the procedure well. Samples were prepared with the cytotechnologist. No immediate complications. IMPRESSION: CT guided right iliac bone marrow aspiration and core biopsy. Electronically Signed   By: Jerilynn Mages.  Shick M.D.   On: 07/19/2016 12:17     ASSESSMENT & PLAN:   75 year old Caucasian male with  1) Myelodysplastic syndrome (RAEB-2 with about 14-15% blasts) ISS-R score of 7 (Very High Risk) Cytogenetics show trisomy 8 (intermediate risk ) Presented with Subacute Anemia (with high normal MCV) + leukopenia/Neutropenia - new since September 2016. Patient has normal platelet counts at this time. Foundation one results reviewed patient has TET2, ASXL1, BCOR RUNX1 SETBP1 SRSF2 and STAG2 mutations identified.  2) Anemia - hemoglobin was down to 6.9 and the patient received 1 unit of PRBC irradiated/CMV neg since his last visit. Hemoglobin today is 8.9 and the patient feels much better.  3) Neutropenia . Patient continues to have an [place] of  100 with 60-70% bone marrow cellularity arguing against Vidaza effect. This appears to be primarily related to his RAEB 2. He had some chills and was in the emergency room. Has been started on ciprofloxacin. PLAN -Hemoglobin and platelet counts are stable today with no indication for additional PRBC transfusions. -Transfuse PRBCs (CMV neg and irradiated) when necessary for symptomatic anemia or if hemoglobin less than 7. We will use it irradiated and CMV negative products only. -We shall continue his ciprofloxacin for neutropenic prophylaxis at this time. -Bone marrow examination showed 7% myeloblasts on flow cytometry and 10-15% on bone marrow examination. No Auer rods noted. Decreased neutrophilic maturation with hypolobated neutrophils. -Findings of the bone marrow examination were discussed in detail with the patient. -From my perspective the patient will be seeing Dr. Ok Edwards and the first decision is to determine if he would be considered for possible [facility] AlloHSCT at this time. We're uncertain that additional Vidaza wouldn't necessarily change his overall bone marrow picture/natural history of his RAEB 2. -If [facility] AlloHSCT is not considered at this time other options would be - continuing Vidaza  for additional 2-4 cycles and reevaluating v/s switching to decitabine vs any clinical trials available at [facility]. -We shall await input from Dr. Shea/Dr Royce Macadamia before proceeding with additional Vidaza or decitabine. -Continue weekly CBC to monitor for transfusion requirements.  4) Slightly elevated bilirubin level even before treatment (?GIlbert's) - 5) Mild iron deficiency ferritin 16-22 Received IV Feraheme on 05/02/2016. Iron deficiency corrected . Bone marrow examination shows reduced iron stores. Patient did get a PRBC transfusion last week.   Return to care with Dr. Irene Limbo depending on input from Dr Ok Edwards from [facility]  All of the patients and his wife's  questions were answered to their apparent satisfaction. The patient knows to call the clinic with any problems, questions or concerns.  I spent 40 minutes counseling the patient face to face. The total time spent in the appointment was 40 minutes and more than 50% was on counseling and direct patient cares.    Sullivan Lone MD [place] AAHIVMS [facility] Hematology/Oncology Physician [facility]  (Office):       [phone] (Work cell):  [phone] (Fax):           [phone]

## 2016-08-03 ENCOUNTER — Encounter (HOSPITAL_COMMUNITY): Payer: Self-pay

## 2016-08-03 ENCOUNTER — Ambulatory Visit (HOSPITAL_BASED_OUTPATIENT_CLINIC_OR_DEPARTMENT_OTHER): Payer: Medicare Other | Admitting: Hematology and Oncology

## 2016-08-03 ENCOUNTER — Ambulatory Visit (HOSPITAL_BASED_OUTPATIENT_CLINIC_OR_DEPARTMENT_OTHER): Payer: Medicare Other

## 2016-08-03 ENCOUNTER — Other Ambulatory Visit: Payer: Self-pay | Admitting: Hematology and Oncology

## 2016-08-03 ENCOUNTER — Telehealth: Payer: Self-pay | Admitting: *Deleted

## 2016-08-03 ENCOUNTER — Encounter: Payer: Self-pay | Admitting: Hematology and Oncology

## 2016-08-03 ENCOUNTER — Ambulatory Visit: Payer: Medicare Other

## 2016-08-03 VITALS — BP 120/68 | HR 87 | Temp 97.7°F | Resp 19 | Wt 168.0 lb

## 2016-08-03 DIAGNOSIS — D4622 Refractory anemia with excess of blasts 2: Secondary | ICD-10-CM | POA: Diagnosis not present

## 2016-08-03 DIAGNOSIS — D61818 Other pancytopenia: Secondary | ICD-10-CM | POA: Diagnosis not present

## 2016-08-03 DIAGNOSIS — R6883 Chills (without fever): Secondary | ICD-10-CM | POA: Diagnosis not present

## 2016-08-03 DIAGNOSIS — K1231 Oral mucositis (ulcerative) due to antineoplastic therapy: Secondary | ICD-10-CM | POA: Diagnosis not present

## 2016-08-03 DIAGNOSIS — R21 Rash and other nonspecific skin eruption: Secondary | ICD-10-CM | POA: Diagnosis not present

## 2016-08-03 MED ORDER — AZACITIDINE CHEMO SQ INJECTION
76.0000 mg/m2 | Freq: Once | INTRAMUSCULAR | Status: AC
  Administered 2016-08-03: 145 mg via SUBCUTANEOUS
  Filled 2016-08-03: qty 5.8

## 2016-08-03 MED ORDER — MAGIC MOUTHWASH: 5.0000 mL | Freq: Four times a day (QID) | ORAL | 0 refills | Status: DC | PRN

## 2016-08-03 MED ORDER — ONDANSETRON HCL 8 MG PO TABS: 8.0000 mg | ORAL_TABLET | Freq: Once | ORAL | Status: DC

## 2016-08-03 MED ORDER — AMOXICILLIN-POT CLAVULANATE 875-125 MG PO TABS: 1.0000 | ORAL_TABLET | Freq: Two times a day (BID) | ORAL | 0 refills | Status: DC

## 2016-08-03 NOTE — Patient Instructions (Signed)
[facility] Discharge Instructions for Patients Receiving Chemotherapy  Today you received the following chemotherapy agents Vidaza  To help prevent nausea and vomiting after your treatment, we encourage you to take your nausea medication as prescribed.    If you develop nausea and vomiting that is not controlled by your nausea medication, call the clinic.   BELOW ARE SYMPTOMS THAT SHOULD BE REPORTED IMMEDIATELY:  *FEVER GREATER THAN 100.5 F  *CHILLS WITH OR WITHOUT FEVER  NAUSEA AND VOMITING THAT IS NOT CONTROLLED WITH YOUR NAUSEA MEDICATION  *UNUSUAL SHORTNESS OF BREATH  *UNUSUAL BRUISING OR BLEEDING  TENDERNESS IN MOUTH AND THROAT WITH OR WITHOUT PRESENCE OF ULCERS  *URINARY PROBLEMS  *BOWEL PROBLEMS  UNUSUAL RASH Items with * indicate a potential emergency and should be followed up as soon as possible.  Feel free to call the clinic you have any questions or concerns. The clinic phone number is [phone].  Please show the CHEMO ALERT CARD at check-in to the Emergency Department and triage nurse.   

## 2016-08-03 NOTE — Telephone Encounter (Signed)
Patient called.  He had lab work yesterday.  His WBC is 1.0 and ANC is 0.1.  He is due to start C5 of Vidaza today.  He has some sore places in his mouth and his left ear feels like it has swelling in it.  He would like someone to see him today before his chemotherapy at 1:45pm,.  He is concerned about infection. Note:  Patient is on Cipro.  Discussed with Dr. Alvy Bimler (who is on call in Dr. Grier Mitts absence.).  She will see patient at 2pm and then patient will get chemotherapy after that.  Called patient and he will be here at 1:30 to register.

## 2016-08-03 NOTE — Assessment & Plan Note (Addendum)
The skin rashes nonspecific in nature and is very peculiar as it only affects his upper extremities. He is asymptomatic. According to the patient, it is slowly improving since discontinuation of fluconazole. I recommend he hold off restarting fluconazole until it gets better

## 2016-08-03 NOTE — Progress Notes (Signed)
[place] OFFICE PROGRESS NOTE  Patient Care Team: Wenda Low, MD as PCP - General (Internal Medicine)  SUMMARY OF ONCOLOGIC HISTORY: I review his records extensively including outside records. This patient has diagnosis of RAEB-2. His bone marrow biopsy in April 2017 show 15% blasts with high risk cytogenetics. He has been receiving chemotherapy since May 2017 with azacytidine. Repeat bone marrow biopsy in August 2017 showed improvement with reduction in blast counts down to about 7%  INTERVAL HISTORY: Please see below for problem oriented charting. The patient requested urgent evaluation today due to symptoms of chills without fever and mucositis pain in his mouth. He is currently receiving cycle 5 of chemotherapy, today would be his day 2 of treatment. He had recent blood work done yesterday which showed pancytopenia. The patient recently went to [facility] on 07/29/2016 for a bone marrow transplant evaluation and was recommended to continue at least 2 more cycles of treatment. The patient had recent skin rash on both upper extremities, presumably related to drug related skin toxicity. Most of his medications were discontinued including levofloxacin & fluconazole His skin rash subsequently improved and he recently restarted prophylactic antimicrobial therapy with ciprofloxacin and valacyclovir but not fluconazole Over the weekend, he had some chills without associated fever. The patient disclosed to me that he had frequent mouth sores/blisters in the past. He started to feel discomfort within his mouth along the gumline, inside his lips and buccal mucosa with associated sensation of fullness over the left parotid region. He has very mild discomfort inside his mouth/sore throat but it is not prohibiting him from eating. He denies recent cough. Denies dysuria, frequency or urgency. He denies changes in his bowel habits. His appetite remains stable.  REVIEW OF  SYSTEMS:   Eyes: Denies blurriness of vision Respiratory: Denies cough, dyspnea or wheezes Cardiovascular: Denies palpitation, chest discomfort or lower extremity swelling Gastrointestinal:  Denies nausea, heartburn or change in bowel habits Lymphatics: Denies new lymphadenopathy or easy bruising Neurological:Denies numbness, tingling or new weaknesses Behavioral/Psych: Mood is stable, no new changes  All other systems were reviewed with the patient and are negative.  I have reviewed the past medical history, past surgical history, social history and family history with the patient and they are unchanged from previous note.  ALLERGIES:  is allergic to midazolam.  MEDICATIONS:  Current Outpatient Prescriptions  Medication Sig Dispense Refill  . calcium carbonate (TUMS - DOSED IN MG ELEMENTAL CALCIUM) 500 MG chewable tablet Chew 1 tablet by mouth at bedtime.     . chlorhexidine (PERIDEX) 0.12 % solution Use as directed 10 mLs in the mouth or throat 2 (two) times daily. Swish and spit. Do not swallow. (Patient taking differently: Use as directed 10 mLs in the mouth or throat 2 (two) times daily as needed (thrush). Swish and spit. Do not swallow.) 473 mL 1  . ciprofloxacin (CIPRO) 500 MG tablet Take 1 tablet (500 mg total) by mouth 2 (two) times daily. 60 tablet 1  . nitroGLYCERIN (NITROSTAT) 0.4 MG SL tablet Place 1 tablet (0.4 mg total) under the tongue every 5 (five) minutes as needed for chest pain. 25 tablet 1  . omeprazole (PRILOSEC) 40 MG capsule Take 40 mg by mouth daily before lunch. 1/2 hour before.    . senna-docusate (SENNA S) 8.6-50 MG tablet Take 2 tablets by mouth 2 (two) times daily. (Patient taking differently: Take 2 tablets by mouth 2 (two) times daily as needed for mild constipation or moderate constipation. )  120 tablet 2  . valACYclovir (VALTREX) 500 MG tablet Take 500 mg by mouth 2 (two) times daily. At night to suppress herpes gingivitis    . valsartan (DIOVAN) 80 MG  tablet Take 40 mg by mouth at bedtime.     [name] amoxicillin-clavulanate (AUGMENTIN) 875-125 MG tablet Take 1 tablet by mouth 2 (two) times daily. 14 tablet 0  . magic mouthwash SOLN Take 5 mLs by mouth 4 (four) times daily as needed for mouth pain. 480 mL 0  . ondansetron (ZOFRAN) 4 MG tablet Take 4 mg by mouth every 8 (eight) hours as needed for nausea or vomiting.      No current facility-administered medications for this visit.    Facility-Administered Medications Ordered in Other Visits  Medication Dose Route Frequency Provider Last Rate Last Dose  . ondansetron (ZOFRAN) tablet 8 mg  8 mg Oral Once Brunetta Genera, MD        PHYSICAL EXAMINATION: ECOG PERFORMANCE STATUS: 1 - Symptomatic but completely ambulatory  Vitals:   08/03/16 1332  BP: 120/68  Pulse: 87  Resp: 19  Temp: 97.7 F (36.5 C)   Filed Weights   08/03/16 1332  Weight: 168 lb (76.2 kg)    GENERAL:alert, no distress and comfortable SKIN:He has well demarcated skin rash on both upper extremities, more so over the right than the left with purpuric changes. EYES: normal, Conjunctiva are pink and non-injected, sclera clear OROPHARYNX: Noted mild mucositis. No thrush NECK: supple, thyroid normal size, non-tender, without nodularity LYMPH:  no palpable lymphadenopathy in the cervical, axillary or inguinal LUNGS: clear to auscultation and percussion with normal breathing effort HEART: regular rate & rhythm and no murmurs and no lower extremity edema ABDOMEN:abdomen soft, non-tender and normal bowel sounds Musculoskeletal:no cyanosis of digits and no clubbing  NEURO: alert & oriented x 3 with fluent speech, no focal motor/sensory deficits  LABORATORY DATA:  I have reviewed the data as listed    Component Value Date/Time   NA 141 08/02/2016 1435   K 4.5 08/02/2016 1435   CL 103 07/17/2016 1701   CO2 26 08/02/2016 1435   GLUCOSE 116 08/02/2016 1435   BUN 19.6 08/02/2016 1435   CREATININE 1.0 08/02/2016 1435    CALCIUM 8.3 (L) 08/02/2016 1435   PROT 6.0 (L) 08/02/2016 1435   ALBUMIN 3.2 (L) 08/02/2016 1435   AST 21 08/02/2016 1435   ALT 12 08/02/2016 1435   ALKPHOS 48 08/02/2016 1435   BILITOT 1.56 (H) 08/02/2016 1435   GFRNONAA >60 07/17/2016 1630   GFRAA >60 07/17/2016 1630    No results found for: SPEP, UPEP  Lab Results  Component Value Date   WBC 1.0 (L) 08/02/2016   NEUTROABS 0.1 (LL) 08/02/2016   HGB 8.8 (L) 08/02/2016   HCT 30.1 (L) 08/02/2016   MCV 95.9 08/02/2016   PLT 164 08/02/2016      Chemistry      Component Value Date/Time   NA 141 08/02/2016 1435   K 4.5 08/02/2016 1435   CL 103 07/17/2016 1701   CO2 26 08/02/2016 1435   BUN 19.6 08/02/2016 1435   CREATININE 1.0 08/02/2016 1435      Component Value Date/Time   CALCIUM 8.3 (L) 08/02/2016 1435   ALKPHOS 48 08/02/2016 1435   AST 21 08/02/2016 1435   ALT 12 08/02/2016 1435   BILITOT 1.56 (H) 08/02/2016 1435      ASSESSMENT & PLAN:  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 He had an appointment last week to [facility] for transplant evaluation. I reviewed the records. His hematologist there recommended we proceed with further chemotherapy. His examination today is satisfactory and I recommend we proceed with treatment without delay.  Pancytopenia ([place]) He has acquired pancytopenia due to his recent treatment and bone marrow disease. He had received blood transfusion recently. He is asymptomatic from anemia today and does not require further blood transfusion. I recommend we proceed with treatment today  Chills without fever He has recent chills without fever. His vital signs are stable. He is currently on prophylactic antibiotics with ciprofloxacin along with antiviral prophylaxis. Antifungal treatment was recently placed on hold due to nonspecific skin rash. We discussed the utility of  antimicrobial therapy. I gave him a prescription of broad-spectrum antibiotics with Augmentin to hang onto. Ciprofloxacin alone may not be adequate for management of neutropenic fever. The patient is high-risk for infection. We discussed common sense approach. The patient is currently wearing a mask on a regular basis and is practicing adequate and hygiene. If he started to develop fever at home, he will start broad-spectrum antibiotics immediately.  However, if he is unstable at home, he needs to proceed to the nearest emergency department for further evaluation for possible management of sepsis He agreed with the plan of care  Mucositis due to antineoplastic therapy He has mild mucositis. I recommend Magic mouthwash swish and swallow for now. It is not preventing him from eating or swallowing.  Skin rash The skin rashes nonspecific in nature and is very peculiar as it only affects his upper extremities. He is asymptomatic. According to the patient, it is slowly improving since discontinuation of fluconazole. I recommend he hold off restarting fluconazole until it gets better      No orders of the defined types were placed in this encounter.  All questions were answered. The patient knows to call the clinic with any problems, questions or concerns. No barriers to learning was detected. I spent 25 minutes counseling the patient face to face. The total time spent in the appointment was 40 minutes and more than 50% was on counseling and review of test results     [facility], [place], MD 08/03/2016 5:37 PM

## 2016-08-03 NOTE — Progress Notes (Signed)
Patient declined zofran as he did not take it yesterday because he believed it was contributing to his constipation and he did not have any problems with nausea and no issues with constipation.

## 2016-08-03 NOTE — Assessment & Plan Note (Signed)
The patient is currently receiving hypomethylating agent with reasonable response to treatment. He had an appointment last week to [facility] for transplant evaluation. I reviewed the records. His hematologist there recommended we proceed with further chemotherapy. His examination today is satisfactory and I recommend we proceed with treatment without delay.

## 2016-08-03 NOTE — Assessment & Plan Note (Signed)
He has recent chills without fever. His vital signs are stable. He is currently on prophylactic antibiotics with ciprofloxacin along with antiviral prophylaxis. Antifungal treatment was recently placed on hold due to nonspecific skin rash. We discussed the utility of antimicrobial therapy. I gave him a prescription of broad-spectrum antibiotics with Augmentin to hang onto. Ciprofloxacin alone may not be adequate for management of neutropenic fever. The patient is high-risk for infection. We discussed common sense approach. The patient is currently wearing a mask on a regular basis and is practicing adequate and hygiene. If he started to develop fever at home, he will start broad-spectrum antibiotics immediately.  However, if he is unstable at home, he needs to proceed to the nearest emergency department for further evaluation for possible management of sepsis He agreed with the plan of care

## 2016-08-03 NOTE — Assessment & Plan Note (Signed)
He has mild mucositis. I recommend Magic mouthwash swish and swallow for now. It is not preventing him from eating or swallowing.

## 2016-08-03 NOTE — Assessment & Plan Note (Signed)
He has acquired pancytopenia due to his recent treatment and bone marrow disease. He had received blood transfusion recently. He is asymptomatic from anemia today and does not require further blood transfusion. I recommend we proceed with treatment today

## 2016-08-04 ENCOUNTER — Ambulatory Visit: Payer: Medicare Other

## 2016-08-04 ENCOUNTER — Ambulatory Visit (HOSPITAL_BASED_OUTPATIENT_CLINIC_OR_DEPARTMENT_OTHER): Payer: Medicare Other

## 2016-08-04 VITALS — BP 110/60 | HR 68 | Temp 98.7°F | Resp 18

## 2016-08-04 DIAGNOSIS — D4622 Refractory anemia with excess of blasts 2: Secondary | ICD-10-CM | POA: Diagnosis present

## 2016-08-04 MED ORDER — ONDANSETRON HCL 8 MG PO TABS
8.0000 mg | ORAL_TABLET | Freq: Once | ORAL | Status: AC
  Administered 2016-08-04: 8 mg via ORAL

## 2016-08-04 MED ORDER — AZACITIDINE CHEMO SQ INJECTION
145.0000 mg | Freq: Once | INTRAMUSCULAR | Status: AC
  Administered 2016-08-04: 145 mg via SUBCUTANEOUS
  Filled 2016-08-04: qty 5.8

## 2016-08-04 MED ORDER — ONDANSETRON HCL 8 MG PO TABS
ORAL_TABLET | ORAL | Status: AC
  Filled 2016-08-04: qty 1

## 2016-08-04 NOTE — Patient Instructions (Signed)
[facility] Discharge Instructions for Patients Receiving Chemotherapy  Today you received the following chemotherapy agents Vidaza  To help prevent nausea and vomiting after your treatment, we encourage you to take your nausea medication as prescribed.    If you develop nausea and vomiting that is not controlled by your nausea medication, call the clinic.   BELOW ARE SYMPTOMS THAT SHOULD BE REPORTED IMMEDIATELY:  *FEVER GREATER THAN 100.5 F  *CHILLS WITH OR WITHOUT FEVER  NAUSEA AND VOMITING THAT IS NOT CONTROLLED WITH YOUR NAUSEA MEDICATION  *UNUSUAL SHORTNESS OF BREATH  *UNUSUAL BRUISING OR BLEEDING  TENDERNESS IN MOUTH AND THROAT WITH OR WITHOUT PRESENCE OF ULCERS  *URINARY PROBLEMS  *BOWEL PROBLEMS  UNUSUAL RASH Items with * indicate a potential emergency and should be followed up as soon as possible.  Feel free to call the clinic you have any questions or concerns. The clinic phone number is [phone].  Please show the CHEMO ALERT CARD at check-in to the Emergency Department and triage nurse.   

## 2016-08-04 NOTE — Progress Notes (Signed)
Per Darden Dates RN per Dr. Irene Limbo okay to treat with CMET results from 08/02/16 (Bilirubin 1.56) and with pt experiencing chills on 08/03/16.  08/04/16 pt states he is no longer experiencing chills at this time. Pt aware to monitor s/s and noify clinic if symptoms worsen, pt verbalizes understanding.

## 2016-08-05 ENCOUNTER — Ambulatory Visit: Payer: Medicare Other

## 2016-08-05 ENCOUNTER — Ambulatory Visit (HOSPITAL_BASED_OUTPATIENT_CLINIC_OR_DEPARTMENT_OTHER): Payer: Medicare Other

## 2016-08-05 VITALS — BP 103/59 | HR 69 | Temp 98.7°F | Resp 18

## 2016-08-05 DIAGNOSIS — D4622 Refractory anemia with excess of blasts 2: Secondary | ICD-10-CM | POA: Diagnosis present

## 2016-08-05 MED ORDER — AZACITIDINE CHEMO SQ INJECTION
76.0000 mg/m2 | Freq: Once | INTRAMUSCULAR | Status: AC
  Administered 2016-08-05: 145 mg via SUBCUTANEOUS
  Filled 2016-08-05: qty 5.8

## 2016-08-05 MED ORDER — ONDANSETRON HCL 8 MG PO TABS
ORAL_TABLET | ORAL | Status: AC
  Filled 2016-08-05: qty 1

## 2016-08-05 MED ORDER — ONDANSETRON HCL 8 MG PO TABS
8.0000 mg | ORAL_TABLET | Freq: Once | ORAL | Status: AC
  Administered 2016-08-05: 8 mg via ORAL

## 2016-08-05 NOTE — Patient Instructions (Signed)
[facility] Discharge Instructions for Patients Receiving Chemotherapy  Today you received the following chemotherapy agents Vidaza  To help prevent nausea and vomiting after your treatment, we encourage you to take your nausea medication as prescribed.    If you develop nausea and vomiting that is not controlled by your nausea medication, call the clinic.   BELOW ARE SYMPTOMS THAT SHOULD BE REPORTED IMMEDIATELY:  *FEVER GREATER THAN 100.5 F  *CHILLS WITH OR WITHOUT FEVER  NAUSEA AND VOMITING THAT IS NOT CONTROLLED WITH YOUR NAUSEA MEDICATION  *UNUSUAL SHORTNESS OF BREATH  *UNUSUAL BRUISING OR BLEEDING  TENDERNESS IN MOUTH AND THROAT WITH OR WITHOUT PRESENCE OF ULCERS  *URINARY PROBLEMS  *BOWEL PROBLEMS  UNUSUAL RASH Items with * indicate a potential emergency and should be followed up as soon as possible.  Feel free to call the clinic you have any questions or concerns. The clinic phone number is [phone].  Please show the CHEMO ALERT CARD at check-in to the Emergency Department and triage nurse.   

## 2016-08-05 NOTE — Progress Notes (Signed)
Pt okay to treat with labs from 08/02/2016,per Dr. Irene Limbo and previous notes.

## 2016-08-08 ENCOUNTER — Other Ambulatory Visit (HOSPITAL_BASED_OUTPATIENT_CLINIC_OR_DEPARTMENT_OTHER): Payer: Medicare Other

## 2016-08-08 ENCOUNTER — Encounter: Payer: Self-pay | Admitting: Hematology

## 2016-08-08 ENCOUNTER — Ambulatory Visit: Payer: Medicare Other

## 2016-08-08 ENCOUNTER — Ambulatory Visit (HOSPITAL_BASED_OUTPATIENT_CLINIC_OR_DEPARTMENT_OTHER): Payer: Medicare Other

## 2016-08-08 ENCOUNTER — Ambulatory Visit (HOSPITAL_BASED_OUTPATIENT_CLINIC_OR_DEPARTMENT_OTHER): Payer: Medicare Other | Admitting: Hematology

## 2016-08-08 VITALS — BP 129/72 | HR 72 | Temp 97.6°F | Resp 16 | Wt 168.7 lb

## 2016-08-08 DIAGNOSIS — E559 Vitamin D deficiency, unspecified: Secondary | ICD-10-CM | POA: Diagnosis not present

## 2016-08-08 DIAGNOSIS — D649 Anemia, unspecified: Secondary | ICD-10-CM

## 2016-08-08 DIAGNOSIS — D709 Neutropenia, unspecified: Secondary | ICD-10-CM

## 2016-08-08 DIAGNOSIS — E785 Hyperlipidemia, unspecified: Secondary | ICD-10-CM

## 2016-08-08 DIAGNOSIS — D4622 Refractory anemia with excess of blasts 2: Secondary | ICD-10-CM

## 2016-08-08 DIAGNOSIS — K1231 Oral mucositis (ulcerative) due to antineoplastic therapy: Secondary | ICD-10-CM

## 2016-08-08 DIAGNOSIS — E611 Iron deficiency: Secondary | ICD-10-CM

## 2016-08-08 DIAGNOSIS — D61818 Other pancytopenia: Secondary | ICD-10-CM

## 2016-08-08 LAB — CBC & DIFF AND RETIC
BASO%: 0 % (ref 0.0–2.0)
Basophils Absolute: 0 10*3/uL (ref 0.0–0.1)
EOS ABS: 0 10*3/uL (ref 0.0–0.5)
EOS%: 0.7 % (ref 0.0–7.0)
HCT: 30.1 % — ABNORMAL LOW (ref 38.4–49.9)
HEMOGLOBIN: 8.8 g/dL — AB (ref 13.0–17.1)
IMMATURE RETIC FRACT: 40.9 % — AB (ref 3.00–10.60)
LYMPH#: 1.1 10*3/uL (ref 0.9–3.3)
LYMPH%: 82.2 % — AB (ref 14.0–49.0)
MCH: 28.1 pg (ref 27.2–33.4)
MCHC: 29.2 g/dL — ABNORMAL LOW (ref 32.0–36.0)
MCV: 96.2 fL (ref 79.3–98.0)
MONO#: 0 10*3/uL — AB (ref 0.1–0.9)
MONO%: 0.7 % (ref 0.0–14.0)
NEUT%: 16.4 % — AB (ref 39.0–75.0)
NEUTROS ABS: 0.2 10*3/uL — AB (ref 1.5–6.5)
PLATELETS: 164 10*3/uL (ref 140–400)
RBC: 3.13 10*6/uL — ABNORMAL LOW (ref 4.20–5.82)
RDW: 19 % — ABNORMAL HIGH (ref 11.0–14.6)
RETIC CT ABS: 77 10*3/uL (ref 34.80–93.90)
Retic %: 2.46 % — ABNORMAL HIGH (ref 0.80–1.80)
WBC: 1.4 10*3/uL — ABNORMAL LOW (ref 4.0–10.3)

## 2016-08-08 LAB — COMPREHENSIVE METABOLIC PANEL
ALBUMIN: 3.3 g/dL — AB (ref 3.5–5.0)
ALK PHOS: 59 U/L (ref 40–150)
ALT: 16 U/L (ref 0–55)
AST: 22 U/L (ref 5–34)
Anion Gap: 7 mEq/L (ref 3–11)
BILIRUBIN TOTAL: 1.27 mg/dL — AB (ref 0.20–1.20)
BUN: 23.5 mg/dL (ref 7.0–26.0)
CO2: 25 mEq/L (ref 22–29)
CREATININE: 1.1 mg/dL (ref 0.7–1.3)
Calcium: 8.7 mg/dL (ref 8.4–10.4)
Chloride: 108 mEq/L (ref 98–109)
EGFR: 64 mL/min/{1.73_m2} — AB (ref >90)
GLUCOSE: 93 mg/dL (ref 70–140)
Potassium: 4.3 mEq/L (ref 3.5–5.1)
SODIUM: 140 meq/L (ref 136–145)
TOTAL PROTEIN: 6.2 g/dL — AB (ref 6.4–8.3)

## 2016-08-08 MED ORDER — AZACITIDINE CHEMO SQ INJECTION
75.5000 mg/m2 | Freq: Once | INTRAMUSCULAR | Status: AC
  Administered 2016-08-08: 145 mg via SUBCUTANEOUS
  Filled 2016-08-08: qty 5.8

## 2016-08-08 MED ORDER — ONDANSETRON HCL 8 MG PO TABS
ORAL_TABLET | ORAL | Status: AC
  Filled 2016-08-08: qty 1

## 2016-08-08 MED ORDER — ONDANSETRON HCL 8 MG PO TABS
8.0000 mg | ORAL_TABLET | Freq: Once | ORAL | Status: AC
  Administered 2016-08-08: 8 mg via ORAL

## 2016-08-08 NOTE — Progress Notes (Signed)
OK to treat per Dr. Irene Limbo with ANC 0.2

## 2016-08-08 NOTE — Patient Instructions (Signed)
[facility] Discharge Instructions for Patients Receiving Chemotherapy  Today you received the following chemotherapy agents Vidaza  To help prevent nausea and vomiting after your treatment, we encourage you to take your nausea medication as prescribed.    If you develop nausea and vomiting that is not controlled by your nausea medication, call the clinic.   BELOW ARE SYMPTOMS THAT SHOULD BE REPORTED IMMEDIATELY:  *FEVER GREATER THAN 100.5 F  *CHILLS WITH OR WITHOUT FEVER  NAUSEA AND VOMITING THAT IS NOT CONTROLLED WITH YOUR NAUSEA MEDICATION  *UNUSUAL SHORTNESS OF BREATH  *UNUSUAL BRUISING OR BLEEDING  TENDERNESS IN MOUTH AND THROAT WITH OR WITHOUT PRESENCE OF ULCERS  *URINARY PROBLEMS  *BOWEL PROBLEMS  UNUSUAL RASH Items with * indicate a potential emergency and should be followed up as soon as possible.  Feel free to call the clinic you have any questions or concerns. The clinic phone number is [phone].  Please show the CHEMO ALERT CARD at check-in to the Emergency Department and triage nurse.   

## 2016-08-09 ENCOUNTER — Ambulatory Visit (HOSPITAL_BASED_OUTPATIENT_CLINIC_OR_DEPARTMENT_OTHER): Payer: Medicare Other

## 2016-08-09 ENCOUNTER — Ambulatory Visit: Payer: Medicare Other

## 2016-08-09 ENCOUNTER — Encounter: Payer: Self-pay | Admitting: General Practice

## 2016-08-09 VITALS — BP 99/60 | HR 73 | Temp 98.8°F | Resp 20

## 2016-08-09 DIAGNOSIS — D4622 Refractory anemia with excess of blasts 2: Secondary | ICD-10-CM

## 2016-08-09 LAB — VITAMIN D 25 HYDROXY (VIT D DEFICIENCY, FRACTURES): Vitamin D, 25-Hydroxy: 37.7 ng/mL (ref 30.0–100.0)

## 2016-08-09 MED ORDER — ONDANSETRON HCL 8 MG PO TABS
8.0000 mg | ORAL_TABLET | Freq: Once | ORAL | Status: AC
  Administered 2016-08-09: 8 mg via ORAL

## 2016-08-09 MED ORDER — AZACITIDINE CHEMO SQ INJECTION
76.0000 mg/m2 | Freq: Once | INTRAMUSCULAR | Status: AC
  Administered 2016-08-09: 145 mg via SUBCUTANEOUS
  Filled 2016-08-09: qty 5.8

## 2016-08-09 MED ORDER — ONDANSETRON HCL 8 MG PO TABS
ORAL_TABLET | ORAL | Status: AC
  Filled 2016-08-09: qty 1

## 2016-08-09 NOTE — Patient Instructions (Signed)
[facility] Discharge Instructions for Patients Receiving Chemotherapy  Today you received the following chemotherapy agents: Vidaza   To help prevent nausea and vomiting after your treatment, we encourage you to take your nausea medication as directed.    If you develop nausea and vomiting that is not controlled by your nausea medication, call the clinic.   BELOW ARE SYMPTOMS THAT SHOULD BE REPORTED IMMEDIATELY:  *FEVER GREATER THAN 100.5 F  *CHILLS WITH OR WITHOUT FEVER  NAUSEA AND VOMITING THAT IS NOT CONTROLLED WITH YOUR NAUSEA MEDICATION  *UNUSUAL SHORTNESS OF BREATH  *UNUSUAL BRUISING OR BLEEDING  TENDERNESS IN MOUTH AND THROAT WITH OR WITHOUT PRESENCE OF ULCERS  *URINARY PROBLEMS  *BOWEL PROBLEMS  UNUSUAL RASH Items with * indicate a potential emergency and should be followed up as soon as possible.  Feel free to call the clinic you have any questions or concerns. The clinic phone number is [phone].  Please show the CHEMO ALERT CARD at check-in to the Emergency Department and triage nurse.   

## 2016-08-10 ENCOUNTER — Ambulatory Visit: Payer: Medicare Other

## 2016-08-10 ENCOUNTER — Telehealth: Payer: Self-pay | Admitting: *Deleted

## 2016-08-10 ENCOUNTER — Ambulatory Visit (HOSPITAL_BASED_OUTPATIENT_CLINIC_OR_DEPARTMENT_OTHER): Payer: Medicare Other

## 2016-08-10 VITALS — BP 105/69 | HR 80 | Temp 98.0°F | Resp 18

## 2016-08-10 DIAGNOSIS — D4622 Refractory anemia with excess of blasts 2: Secondary | ICD-10-CM

## 2016-08-10 DIAGNOSIS — Z5111 Encounter for antineoplastic chemotherapy: Secondary | ICD-10-CM | POA: Diagnosis present

## 2016-08-10 MED ORDER — AZACITIDINE CHEMO SQ INJECTION
76.0000 mg/m2 | Freq: Once | INTRAMUSCULAR | Status: AC
  Administered 2016-08-10: 145 mg via SUBCUTANEOUS
  Filled 2016-08-10: qty 5.8

## 2016-08-10 MED ORDER — ONDANSETRON HCL 8 MG PO TABS
8.0000 mg | ORAL_TABLET | Freq: Once | ORAL | Status: AC
  Administered 2016-08-10: 8 mg via ORAL

## 2016-08-10 MED ORDER — ONDANSETRON HCL 8 MG PO TABS
ORAL_TABLET | ORAL | Status: AC
  Filled 2016-08-10: qty 1

## 2016-08-10 NOTE — Progress Notes (Signed)
[place] Spiritual Care Note  Met with Reeve in my office yesterday per his request.  Per pt, he is aware that he is not responding as well to tx as hoped.  He verbalized his understanding that tx failure could cause him to be hospitalized with severe reaction to an illness that his body cannot fight off.  Anticipating this possibility and wanting to support his family (esp his wife) by planning in advance, [place] is making arrangements for his death.  (This is a proactive approach to considering EOL, not guided by a sense of impending death.)  Per pt, he has a plan for a graveside service (congruent with extended family's [employer] background), a memorial service at [employer] (more to his taste; he plans to meet soon with pastor Rolly Pancake to plan), and cremation through a local crematory (not a funeral home, he specifies).  He wanted to explore possible logistics if he were to experience a hospital death, including whether his family would have opportunity to say goodbyes in the room after his passing (or at the morgue, if need be).  Ensuring that his family would be called from the surrounding area to say goodbye in the hospital before or after his death is a high spiritual and emotional need for him.  Because [place] pts usually receive care at [facility], I assured him that I would consult with [facility] chaplains regarding this priority and encouraged him to reach out (or to encourage his family to reach out) to a chaplain during any admission for spiritual support and advocacy.  Kela Millin plans to f/u with me as further questions or emotional need for processing/reflection arise, but please also page if circumstances change.  Thank you.   [place], [place], [facility] Pager [phone] Voicemail [phone]

## 2016-08-10 NOTE — Telephone Encounter (Signed)
"  I need my appointment changed.  I am to have ;ab weekly and see Dr. Irene Limbo every other week.  My visit with him need to be moved to 08-22-2016."  Will notify provider.  Return number [phone].

## 2016-08-10 NOTE — Patient Instructions (Signed)
[facility] Discharge Instructions for Patients Receiving Chemotherapy  Today you received the following chemotherapy agents: Vidaza   To help prevent nausea and vomiting after your treatment, we encourage you to take your nausea medication as directed.    If you develop nausea and vomiting that is not controlled by your nausea medication, call the clinic.   BELOW ARE SYMPTOMS THAT SHOULD BE REPORTED IMMEDIATELY:  *FEVER GREATER THAN 100.5 F  *CHILLS WITH OR WITHOUT FEVER  NAUSEA AND VOMITING THAT IS NOT CONTROLLED WITH YOUR NAUSEA MEDICATION  *UNUSUAL SHORTNESS OF BREATH  *UNUSUAL BRUISING OR BLEEDING  TENDERNESS IN MOUTH AND THROAT WITH OR WITHOUT PRESENCE OF ULCERS  *URINARY PROBLEMS  *BOWEL PROBLEMS  UNUSUAL RASH Items with * indicate a potential emergency and should be followed up as soon as possible.  Feel free to call the clinic you have any questions or concerns. The clinic phone number is [phone].  Please show the CHEMO ALERT CARD at check-in to the Emergency Department and triage nurse.   

## 2016-08-15 ENCOUNTER — Ambulatory Visit: Payer: Medicare Other | Admitting: Hematology

## 2016-08-15 ENCOUNTER — Other Ambulatory Visit (HOSPITAL_BASED_OUTPATIENT_CLINIC_OR_DEPARTMENT_OTHER): Payer: Medicare Other

## 2016-08-15 DIAGNOSIS — D4622 Refractory anemia with excess of blasts 2: Secondary | ICD-10-CM

## 2016-08-15 LAB — CBC & DIFF AND RETIC
BASO%: 0 % (ref 0.0–2.0)
BASOS ABS: 0 10*3/uL (ref 0.0–0.1)
EOS ABS: 0 10*3/uL (ref 0.0–0.5)
EOS%: 1.6 % (ref 0.0–7.0)
HEMATOCRIT: 28.3 % — AB (ref 38.4–49.9)
HEMOGLOBIN: 8.2 g/dL — AB (ref 13.0–17.1)
IMMATURE RETIC FRACT: 37.4 % — AB (ref 3.00–10.60)
LYMPH#: 1.1 10*3/uL (ref 0.9–3.3)
LYMPH%: 85.8 % — AB (ref 14.0–49.0)
MCH: 28.3 pg (ref 27.2–33.4)
MCHC: 29 g/dL — ABNORMAL LOW (ref 32.0–36.0)
MCV: 97.6 fL (ref 79.3–98.0)
MONO#: 0 10*3/uL — AB (ref 0.1–0.9)
MONO%: 1.6 % (ref 0.0–14.0)
NEUT#: 0.1 10*3/uL — CL (ref 1.5–6.5)
NEUT%: 11 % — AB (ref 39.0–75.0)
PLATELETS: 173 10*3/uL (ref 140–400)
RBC: 2.9 10*6/uL — ABNORMAL LOW (ref 4.20–5.82)
RDW: 19.9 % — ABNORMAL HIGH (ref 11.0–14.6)
Retic %: 4.12 % — ABNORMAL HIGH (ref 0.80–1.80)
Retic Ct Abs: 119.48 10*3/uL — ABNORMAL HIGH (ref 34.80–93.90)
WBC: 1.3 10*3/uL — ABNORMAL LOW (ref 4.0–10.3)

## 2016-08-15 LAB — COMPREHENSIVE METABOLIC PANEL
ALBUMIN: 3.3 g/dL — AB (ref 3.5–5.0)
ALK PHOS: 58 U/L (ref 40–150)
ALT: 17 U/L (ref 0–55)
AST: 24 U/L (ref 5–34)
Anion Gap: 7 mEq/L (ref 3–11)
BUN: 21.8 mg/dL (ref 7.0–26.0)
CO2: 24 meq/L (ref 22–29)
Calcium: 8.6 mg/dL (ref 8.4–10.4)
Chloride: 108 mEq/L (ref 98–109)
Creatinine: 1.2 mg/dL (ref 0.7–1.3)
EGFR: 56 mL/min/{1.73_m2} — AB (ref >90)
GLUCOSE: 130 mg/dL (ref 70–140)
POTASSIUM: 4.4 meq/L (ref 3.5–5.1)
SODIUM: 140 meq/L (ref 136–145)
Total Bilirubin: 1.14 mg/dL (ref 0.20–1.20)
Total Protein: 6.1 g/dL — ABNORMAL LOW (ref 6.4–8.3)

## 2016-08-15 NOTE — Progress Notes (Signed)
Warren [name]    HEMATOLOGY/ONCOLOGY CLINIC NOTE  Date of Service: .08/08/2016  Patient Care Team: Wenda Low, MD as PCP - General (Internal Medicine)  Dr [name] ([facility])  CHIEF COMPLAINTS: Follow-up for MDS  DIAGNOSIS RAEB-2 with about 15% blasts. Trisomy 8 (Intermediate risk) on FISH studies. R-ISS ( score 7- very high risk)  INTERVAL HISTORY  [name] is here for his scheduled follow-up after his recent reevaluation by Dr. Ok Edwards at [facility]. Given that the patient has not had frank leukemic progression and possibly some decrease in blast count he was recommended to continue Vidaza for 2 additional cycles. He continues to be on ciprofloxacin for antimicrobial prophylaxis. He needs to be back on antifungal prophylaxis. He is willing to try fluconazole again to determine if this is in fact what caused his rash. No other acute new symptoms. No fevers or chills. Mouth sores are resolving.  MEDICAL HISTORY:  Past Medical History:  Diagnosis Date  . Cancer [facility])    Prostate, Melanoma- lt. Shoulder (no further problems since 2010  . Complication of anesthesia    versed gave the adverse reaction pt. ,became aggitated  . COPD (chronic obstructive pulmonary disease) (HCC)    Dr. Annamaria Boots follows  . Diffuse esophageal spasm   . Diverticulosis   . DJD (degenerative joint disease)   . Multiple trauma     horse accident 2003 L SHOULDER  . Oral herpes   . OSA (obstructive sleep apnea)    mild, rare cpap use  . Peripheral neuropathy ([place])   . Pulmonary nodule   . Spinal stenosis   . Systolic hypertension   Recurrent HSV related cold sores. Acoustic neuroma left 1999 - s/p surgery. Pruritus ani Injury in 2003 related to a horse accident resulting in hydrothorax, pneumothorax, fractured ribs, dislocated left shoulder, rotator cuff tear on the left, left elbow damage and left ulnar nerve damage.   SURGICAL HISTORY: Past Surgical History:  Procedure Laterality Date  .  ,laceration to head in accident with hourse    . ACOUSTIC NEUROMA LEFT EAR  1999  . BALLOON DILATION N/A 11/25/2014   Procedure: BALLOON DILATION;  Surgeon: Garlan Fair, MD;  Location: [facility];  Service: Endoscopy;  Laterality: N/A;  . COLONOSCOPY WITH PROPOFOL N/A 10/01/2013   Procedure: COLONOSCOPY WITH PROPOFOL;  Surgeon: Garlan Fair, MD;  Location: [facility];  Service: Endoscopy;  Laterality: N/A;  . ESOPHAGOGASTRODUODENOSCOPY (EGD) WITH PROPOFOL N/A 11/25/2014   Procedure: ESOPHAGOGASTRODUODENOSCOPY (EGD) WITH PROPOFOL;  Surgeon: Garlan Fair, MD;  Location: [facility];  Service: Endoscopy;  Laterality: N/A;  . EYE SURGERY     cosmetic surgery"brow lift" 4'15  . MULTIPLE PROCEDURES FOR LEFT ARM     ELBOW,RIB FX PNEUMOTHORAX AFTER FALLING OFF MOUNTAIN  . PILONIDAL CYST / SINUS EXCISION    . RADICAL PROSTATECTOMY      SOCIAL HISTORY: Social History   Social History  . Marital status: Married    Spouse name: N/A  . Number of children: N/A  . Years of education: N/A   Occupational History  . Not on file.   Social History Main Topics  . Smoking status: Former Smoker    Packs/day: 1.00    Years: 40.00    Quit date: 11/29/1995  . Smokeless tobacco: Never Used  . Alcohol use Yes     Comment: 1 drink per day  . Drug use: No  . Sexual activity: Not on file   Other Topics Concern  . Not  on file   Social History Narrative   CPAP 8 advanced   Patient drinks 5 cups of caffeine daily   Exercises 3-4 times weekly   Son is a pulmonologist    FAMILY HISTORY: Family History  Problem Relation Age of Onset  . Emphysema Father   . Asthma Father   . Lung cancer Father   . Breast cancer Sister   . Breast cancer Sister   . Skin cancer Brother   . Prostate cancer Brother    Brother with a recent diagnosis of hemachromatosis Brother with CLL/SLL Mother had anemia, thalassemia. Hepatitis C with liver cancer  ALLERGIES:  is allergic to  midazolam.  MEDICATIONS:  Current Outpatient Prescriptions  Medication Sig Dispense Refill  . amoxicillin-clavulanate (AUGMENTIN) 875-125 MG tablet Take 1 tablet by mouth 2 (two) times daily. 14 tablet 0  . calcium carbonate (TUMS - DOSED IN MG ELEMENTAL CALCIUM) 500 MG chewable tablet Chew 1 tablet by mouth at bedtime.     . ciprofloxacin (CIPRO) 500 MG tablet Take 1 tablet (500 mg total) by mouth 2 (two) times daily. 60 tablet 1  . magic mouthwash SOLN Take 5 mLs by mouth 4 (four) times daily as needed for mouth pain. 480 mL 0  . nitroGLYCERIN (NITROSTAT) 0.4 MG SL tablet Place 1 tablet (0.4 mg total) under the tongue every 5 (five) minutes as needed for chest pain. 25 tablet 1  . omeprazole (PRILOSEC) 40 MG capsule Take 40 mg by mouth daily before lunch. 1/2 hour before.    . ondansetron (ZOFRAN) 4 MG tablet Take 4 mg by mouth every 8 (eight) hours as needed for nausea or vomiting.     . senna-docusate (SENNA S) 8.6-50 MG tablet Take 2 tablets by mouth 2 (two) times daily. (Patient taking differently: Take 2 tablets by mouth 2 (two) times daily as needed for mild constipation or moderate constipation. ) 120 tablet 2  . valACYclovir (VALTREX) 500 MG tablet Take 500 mg by mouth 2 (two) times daily. At night to suppress herpes gingivitis    . valsartan (DIOVAN) 80 MG tablet Take 40 mg by mouth at bedtime.      No current facility-administered medications for this visit.     REVIEW OF SYSTEMS:    10 Point review of Systems was done is negative except as noted above.  PHYSICAL EXAMINATION: ECOG PERFORMANCE STATUS: 1 - Symptomatic but completely ambulatory  . Vitals:   08/08/16 1532  BP: 129/72  Pulse: 72  Resp: 16  Temp: 97.6 F (36.4 C)   Filed Weights   08/08/16 1532  Weight: 168 lb 11.2 oz (76.5 kg)   .Body mass index is 26.42 kg/m.  GENERAL:alert, in no acute distress and comfortable SKIN: Resolving rash on his upper extremities EYES: normal, conjunctiva are pink and  non-injected, sclera clear OROPHARYNX:no exudate, no erythema and lips, buccal mucosa, and tongue normal  NECK: supple, no JVD, thyroid normal size, non-tender, without nodularity LYMPH:  no palpable lymphadenopathy in the cervical, axillary or inguinal LUNGS: clear to auscultation With somewhat decreased entry bilaterally, no wheezing HEART: regular rate & rhythm,  no murmurs and no lower extremity edema ABDOMEN: abdomen soft, non-tender, normoactive bowel sounds , no palpable hepatosplenomegaly Musculoskeletal: no cyanosis of digits. PSYCH: alert & oriented x 3 with fluent speech NEURO: no focal motor deficits.  LABORATORY DATA:  I have reviewed the data as listed  . CBC Latest Ref Rng & Units 08/15/2016 08/08/2016 08/02/2016  WBC 4.0 - 10.3 10e3/uL  1.3(L) 1.4(L) 1.0(L)  Hemoglobin 13.0 - 17.1 g/dL 8.2(L) 8.8(L) 8.8(L)  Hematocrit 38.4 - 49.9 % 28.3(L) 30.1(L) 30.1(L)  Platelets 140 - 400 10e3/uL 173 164 164   . CBC    Component Value Date/Time   WBC 1.3 (L) 08/15/2016 1424   WBC 0.9 (LL) 07/19/2016 0930   RBC 2.90 (L) 08/15/2016 1424   RBC 2.44 (L) 07/19/2016 0930   HGB 8.2 (L) 08/15/2016 1424   HCT 28.3 (L) 08/15/2016 1424   PLT 173 08/15/2016 1424   MCV 97.6 08/15/2016 1424   MCH 28.3 08/15/2016 1424   MCH 28.3 07/19/2016 0930   MCHC 29.0 (L) 08/15/2016 1424   MCHC 28.8 (L) 07/19/2016 0930   RDW 19.9 (H) 08/15/2016 1424   LYMPHSABS 1.1 08/15/2016 1424   MONOABS 0.0 (L) 08/15/2016 1424   EOSABS 0.0 08/15/2016 1424   BASOSABS 0.0 08/15/2016 1424    . CMP Latest Ref Rng & Units 08/08/2016 08/02/2016 07/25/2016  Glucose 70 - 140 mg/dl 93 116 99  BUN 7.0 - 26.0 mg/dL 23.5 19.6 17.9  Creatinine 0.7 - 1.3 mg/dL 1.1 1.0 1.1  Sodium 136 - 145 mEq/L 140 141 142  Potassium 3.5 - 5.1 mEq/L 4.3 4.5 4.4  Chloride 101 - 111 mmol/L - - -  CO2 22 - 29 mEq/L '25 26 26  '$ Calcium 8.4 - 10.4 mg/dL 8.7 8.3(L) 8.6  Total Protein 6.4 - 8.3 g/dL 6.2(L) 6.0(L) 6.2(L)  Total Bilirubin 0.20 -  1.20 mg/dL 1.27(H) 1.56(H) 1.63(H)  Alkaline Phos 40 - 150 U/L 59 48 56  AST 5 - 34 U/L '22 21 21  '$ ALT 0 - 55 U/L '16 12 15       '$ GROSS AND MICROSCOPIC INFORMATION Specimen Clinical Information MDS [rd] Source Bone Marrow, Aspirate,Biopsy, and Clot, right iliac and core Microscopic LAB DATA: CBC performed on 07/19/2016 shows: WBC 0.9 K/ul Neutrophils 4% HB 6.9 g/dl Lymphocytes 91% HCT 24.0 % Monocytes 4% MCV 98.4 fL Eosinophils 1% RDW 19.0 % Basophils 0% PLT 187 K/ul PERIPHERAL BLOOD SMEAR: The red blood cells display moderate anisocytosis with macrocytic and normocytic cells. There is mild to moderate poikilocytosis with microspherocytes, elliptocytes, target cells, and tear drop cells. There is prominent polychromasia. The white blood cells are decreased in number with marked neutropenia and relative abundance of small lymphocytes. A rare large blastic cell is seen on scan. No Auer rods are identified. The platelets 1 of 3 FINAL for Boyum, Paydon H ([id number]) Microscopic(continued) are normal in number. BONE MARROW ASPIRATE: Erythroid precursors: Progressive maturation with scattered cells displaying nuclear cytoplasmic dyssynchrony or irregular nuclei. Granulocytic precursors: Left shifted maturation with increased number of blastic cells estimated at 10% of all cells. The blasts are medium to large in size with round to slightly irregular nuclei, high nuclear cytoplasmic ratio, fine chromatin and small to inconspicuous nucleoli. No Auer rods are identified. Maturing neutrophilic cells are decreased but scattered cells display hypogranulation and/or abnormal lobation. Megakaryocytes: Abundant with many small hypolobated forms, forms with separate nuclear lobes or large atypical forms. Lymphocytes/plasma cells: Large aggregates not present. TOUCH PREPARATIONS: A mixture of myeloid cell types with scattering of blastic cells. CLOT and BIOPSY: The sections show 60 to 70%  cellularity with a mixture of myeloid cell types but with relative abundance of erythroid precursors and conspicuous presence of immature mononuclear cells. Megakaryocytes are also abundant with many abnormal forms. Maturing neutrophilic cells appear decreased in number. Immunohistochemical stains for CD34, CD117, E-cadherin and myeloperoxidase were performed with appropriate controls.  CD34 highlights increased number of positive mononuclear cells consisting of interstitial cells and scattering of small clusters composed of few cells. The positivity is estimated at 10 to 15% of all cells. CD117 highlights an increased number of positive mononuclear cells with interstitial cells and numerous variably sized but predominantly small clusters representing early granulocytic and/or erythroid precursors. Myeloperoxidase highlights the granulocytic component including immature mononuclear cells and E-cadherin highlights the erythroid component consisting of numerous variably sized clusters. IRON STAIN: Iron stains are performed on a bone marrow aspirate smear and section of clot. The controls stained appropriately. Storage Iron: Scanty. Ringed Sideroblasts: Absent. ADDITIONAL DATA / TESTING: Flow cytometric analysis ([id number]) shows a slight increase in blastic cells representing an estimated 7% of all cells with expression of HLA-DR, CD13, CD33, CD34, CD117 and myeloperoxidase consistent with myeloid phenotype. The specimen was sent for cytogenetic analysis and FISH for MDS and a separate report will follow. (BNS:ecj 07/21/2016)   Erythropoietin: 92.6  RADIOGRAPHIC STUDIES: I have personally reviewed the radiological images as listed and agreed with the findings in the report. Dg Chest 2 View  Result Date: 07/17/2016 CLINICAL DATA:  Hypothermia.  Chills.  Prostate cancer.  Anemia. EXAM: CHEST  2 VIEW COMPARISON:  03/09/2016 FINDINGS: Heart size is normal. There is aortic atherosclerosis.  Mediastinal shadows are otherwise normal. The lungs are clear. No effusions. Healed or healing rib fracture of the left eighth rib posterior laterally. Ordinary degenerative changes affect the thoracic spine. IMPRESSION: No active cardiopulmonary disease. Healed or healing left eighth rib fracture. Electronically Signed   By: Nelson Chimes M.D.   On: 07/17/2016 16:36   Ct Biopsy  Result Date: 07/19/2016 INDICATION: REFRACTORY ANEMIA, MYELODYSPLASTIC SYNDROME EXAM: CT GUIDED RIGHT ILIAC BONE MARROW ASPIRATION AND CORE BIOPSY Date:  8/22/20178/22/2017 11:49 am Radiologist:  M. Daryll Brod, MD Guidance:  CT FLUOROSCOPY TIME:  Fluoroscopy Time: None. MEDICATIONS: None. ANESTHESIA/SEDATION: 125 Mcg IV Fentanyl Moderate Sedation Time:  13 minutes The patient was continuously monitored during the procedure by the interventional radiology nurse under my direct supervision. CONTRAST:  None. COMPLICATIONS: None PROCEDURE: Informed consent was obtained from the patient following explanation of the procedure, risks, benefits and alternatives. The patient understands, agrees and consents for the procedure. All questions were addressed. A time out was performed. The patient was positioned prone and non-contrast localization CT was performed of the pelvis to demonstrate the iliac marrow spaces. Maximal barrier sterile technique utilized including caps, mask, sterile gowns, sterile gloves, large sterile drape, hand hygiene, and Betadine prep. Under sterile conditions and local anesthesia, an 11 gauge coaxial bone biopsy needle was advanced into the right iliac marrow space. Needle position was confirmed with CT imaging. Initially, bone marrow aspiration was performed. Next, the 11 gauge outer cannula was utilized to obtain a right iliac bone marrow core biopsy. Needle was removed. Hemostasis was obtained with compression. The patient tolerated the procedure well. Samples were prepared with the cytotechnologist. No immediate  complications. IMPRESSION: CT guided right iliac bone marrow aspiration and core biopsy. Electronically Signed   By: Jerilynn Mages.  Shick M.D.   On: 07/19/2016 12:17    ASSESSMENT & PLAN:   75 year old Caucasian male with  1) Myelodysplastic syndrome (RAEB-2 with about 14-15% blasts) ISS-R score of 7 (Very High Risk) Cytogenetics show trisomy 8 (intermediate risk ) Presented with Subacute Anemia (with high normal MCV) + leukopenia/Neutropenia - new since September 2016. Patient has normal platelet counts at this time. Foundation one results reviewed patient has TET2, ASXL1, BCOR RUNX1 SETBP1  SRSF2 and STAG2 mutations identified. Repeat after his bone marrow suggested possibly some decrease in his blasts counts 7% by flow cytometry.  2) Anemia -  hemoglobin is relatively stable after his recent transfusion. No indication for PRBC transfusion today. 3) Neutropenia . Patient continues to have an [place] of 100-200 range with 60-70% bone marrow cellularity arguing against Vidaza effect. This appears to be primarily related to his RAEB 2. He had some chills and was in the emergency room. Has been started on ciprofloxacin. PLAN -Hemoglobin and platelet counts are stable today with no indication for additional PRBC transfusions. -Transfuse PRBCs (CMV neg and irradiated) when necessary for symptomatic anemia or if hemoglobin less than 7. We will use it irradiated and CMV negative products only. -We shall continue his ciprofloxacin for neutropenic prophylaxis at this time. -Given his persistent profound neutropenia the patient requires fluconazole prophylaxis - he is willing to be rechallenged with this to determine if his rash comes back. He will take 200 mg by mouth daily for a few days and he does not have a rash he will increase it to his standing dose of 400 mg by mouth daily. -Continue Vidaza for 2 additional cycles as per Dr. Octaviano Batty recommendation while other treatment options including transplant are  considered. -Continue weekly CBC to monitor for transfusion requirements.  4) Slightly elevated bilirubin level even before treatment (?GIlbert's) - 5) Mild iron deficiency ferritin 16-22 Received IV Feraheme on 05/02/2016. Iron deficiency corrected . Bone marrow examination shows reduced iron stores. Patient did get a PRBC transfusion last week.  We'll continue to follow the patient every 2 weeks and have labs every week . Patient is agreeable to this plan of care.  I spent 25 minutes counseling the patient face to face. The total time spent in the appointment was 25 minutes and more than 50% was on counseling and direct patient cares.    Sullivan Lone MD [place] AAHIVMS [facility] Hematology/Oncology Physician [facility]  (Office):       [phone] (Work cell):  [phone] (Fax):           [phone]

## 2016-08-16 ENCOUNTER — Telehealth: Payer: Self-pay | Admitting: Hematology

## 2016-08-16 DIAGNOSIS — H2513 Age-related nuclear cataract, bilateral: Secondary | ICD-10-CM | POA: Diagnosis not present

## 2016-08-16 DIAGNOSIS — H52203 Unspecified astigmatism, bilateral: Secondary | ICD-10-CM | POA: Diagnosis not present

## 2016-08-16 NOTE — Telephone Encounter (Signed)
lvm to inform pt of 9/25 appt date/time per LOS

## 2016-08-19 ENCOUNTER — Other Ambulatory Visit: Payer: Self-pay | Admitting: *Deleted

## 2016-08-19 DIAGNOSIS — D4622 Refractory anemia with excess of blasts 2: Secondary | ICD-10-CM

## 2016-08-22 ENCOUNTER — Other Ambulatory Visit: Payer: Self-pay | Admitting: *Deleted

## 2016-08-22 ENCOUNTER — Other Ambulatory Visit (HOSPITAL_BASED_OUTPATIENT_CLINIC_OR_DEPARTMENT_OTHER): Payer: Medicare Other

## 2016-08-22 ENCOUNTER — Ambulatory Visit (HOSPITAL_BASED_OUTPATIENT_CLINIC_OR_DEPARTMENT_OTHER): Payer: Medicare Other | Admitting: Hematology

## 2016-08-22 ENCOUNTER — Telehealth: Payer: Self-pay | Admitting: Hematology

## 2016-08-22 ENCOUNTER — Encounter: Payer: Self-pay | Admitting: Hematology

## 2016-08-22 VITALS — BP 124/67 | HR 75 | Temp 98.5°F | Resp 17 | Ht 67.0 in | Wt 163.9 lb

## 2016-08-22 DIAGNOSIS — R21 Rash and other nonspecific skin eruption: Secondary | ICD-10-CM | POA: Diagnosis not present

## 2016-08-22 DIAGNOSIS — D4622 Refractory anemia with excess of blasts 2: Secondary | ICD-10-CM

## 2016-08-22 DIAGNOSIS — D61818 Other pancytopenia: Secondary | ICD-10-CM

## 2016-08-22 DIAGNOSIS — D649 Anemia, unspecified: Secondary | ICD-10-CM

## 2016-08-22 DIAGNOSIS — D709 Neutropenia, unspecified: Secondary | ICD-10-CM | POA: Diagnosis not present

## 2016-08-22 LAB — CBC WITH DIFFERENTIAL/PLATELET
BASO%: 0.1 % (ref 0.0–2.0)
Basophils Absolute: 0 10*3/uL (ref 0.0–0.1)
EOS ABS: 0 10*3/uL (ref 0.0–0.5)
EOS%: 1.8 % (ref 0.0–7.0)
HCT: 28.4 % — ABNORMAL LOW (ref 38.4–49.9)
HGB: 8.3 g/dL — ABNORMAL LOW (ref 13.0–17.1)
LYMPH%: 90.8 % — AB (ref 14.0–49.0)
MCH: 28.7 pg (ref 27.2–33.4)
MCHC: 29.4 g/dL — ABNORMAL LOW (ref 32.0–36.0)
MCV: 97.8 fL (ref 79.3–98.0)
MONO#: 0 10*3/uL — AB (ref 0.1–0.9)
MONO%: 1.9 % (ref 0.0–14.0)
NEUT%: 5.4 % — AB (ref 39.0–75.0)
NEUTROS ABS: 0.1 10*3/uL — AB (ref 1.5–6.5)
PLATELETS: 155 10*3/uL (ref 140–400)
RBC: 2.9 10*6/uL — AB (ref 4.20–5.82)
RDW: 21.5 % — ABNORMAL HIGH (ref 11.0–14.6)
WBC: 1.1 10*3/uL — AB (ref 4.0–10.3)
lymph#: 1 10*3/uL (ref 0.9–3.3)

## 2016-08-22 LAB — COMPREHENSIVE METABOLIC PANEL
ALBUMIN: 3.2 g/dL — AB (ref 3.5–5.0)
ALK PHOS: 57 U/L (ref 40–150)
ALT: 18 U/L (ref 0–55)
AST: 26 U/L (ref 5–34)
Anion Gap: 7 mEq/L (ref 3–11)
BUN: 17 mg/dL (ref 7.0–26.0)
CO2: 25 meq/L (ref 22–29)
Calcium: 8.3 mg/dL — ABNORMAL LOW (ref 8.4–10.4)
Chloride: 110 mEq/L — ABNORMAL HIGH (ref 98–109)
Creatinine: 1.1 mg/dL (ref 0.7–1.3)
EGFR: 67 mL/min/{1.73_m2} — AB (ref >90)
GLUCOSE: 103 mg/dL (ref 70–140)
POTASSIUM: 4.3 meq/L (ref 3.5–5.1)
SODIUM: 142 meq/L (ref 136–145)
Total Bilirubin: 0.95 mg/dL (ref 0.20–1.20)
Total Protein: 6 g/dL — ABNORMAL LOW (ref 6.4–8.3)

## 2016-08-22 MED ORDER — POSACONAZOLE 100 MG PO TBEC: 300.0000 mg | DELAYED_RELEASE_TABLET | Freq: Every day | ORAL | 2 refills | Status: DC

## 2016-08-22 MED ORDER — VALACYCLOVIR HCL 500 MG PO TABS
500.0000 mg | ORAL_TABLET | Freq: Two times a day (BID) | ORAL | 1 refills | Status: DC
Start: 2016-08-22 — End: 2016-11-03

## 2016-08-22 MED ORDER — POSACONAZOLE 100 MG PO TBEC: 300.0000 mg | DELAYED_RELEASE_TABLET | Freq: Every day | ORAL | 0 refills | Status: DC

## 2016-08-22 NOTE — Telephone Encounter (Signed)
GAVE PATIENT AVS REPORT AND APPOINTMENTS FOR October  °

## 2016-08-23 NOTE — Progress Notes (Signed)
Warren [name]    HEMATOLOGY/ONCOLOGY CLINIC NOTE  Date of Service: .08/22/2016  Patient Care Team: Wenda Low, MD as PCP - General (Internal Medicine)  Dr [name] ([facility])  CHIEF COMPLAINTS: Follow-up for MDS  DIAGNOSIS RAEB-2 with about 15% blasts. Trisomy 8 (Intermediate risk) on FISH studies. R-ISS ( score 7- very high risk)  Current treatment Vidaza s/p 5 cycles [place] at [facility] with Dr. Ok Edwards and Dr. Royce Macadamia for evaluation regarding transplant and other treatment options.  INTERVAL HISTORY  Mr Sandmeyer is here for his scheduled follow-up for his very high risk MDS. he notes some grade 1 fatigue related to [place] and also his anemia . No fevers or chills. Mild sores relatively stable with his mouthwashes.   He notes that he had a good fishing trip to [place]. He continues to be on ciprofloxacin for antimicrobial prophylaxis. His rash on the forearms started to get worse after going back and fluconazole . He has been given a prescription for posaconazole and paperwork to try to get co-pay assistance since its cost is rather prohibitive even with insurance coverage. No other acute new symptoms. No fevers or chills. Mouth sores are resolving.  MEDICAL HISTORY:  Past Medical History:  Diagnosis Date  . Cancer [facility])    Prostate, Melanoma- lt. Shoulder (no further problems since 2010  . Complication of anesthesia    versed gave the adverse reaction pt. ,became aggitated  . COPD (chronic obstructive pulmonary disease) (HCC)    Dr. Annamaria Boots follows  . Diffuse esophageal spasm   . Diverticulosis   . DJD (degenerative joint disease)   . Multiple trauma     horse accident 2003 L SHOULDER  . Oral herpes   . OSA (obstructive sleep apnea)    mild, rare cpap use  . Peripheral neuropathy ([place])   . Pulmonary nodule   . Spinal stenosis   . Systolic hypertension   Recurrent HSV related cold sores. Acoustic neuroma left 1999 - s/p surgery. Pruritus ani Injury in 2003 related to a  horse accident resulting in hydrothorax, pneumothorax, fractured ribs, dislocated left shoulder, rotator cuff tear on the left, left elbow damage and left ulnar nerve damage.   SURGICAL HISTORY: Past Surgical History:  Procedure Laterality Date  . ,laceration to head in accident with hourse    . ACOUSTIC NEUROMA LEFT EAR  1999  . BALLOON DILATION N/A 11/25/2014   Procedure: BALLOON DILATION;  Surgeon: Garlan Fair, MD;  [facility];  Service: Endoscopy;  Laterality: N/A;  . COLONOSCOPY WITH PROPOFOL N/A 10/01/2013   Procedure: COLONOSCOPY WITH PROPOFOL;  Surgeon: Garlan Fair, MD;  [facility];  Service: Endoscopy;  Laterality: N/A;  . ESOPHAGOGASTRODUODENOSCOPY (EGD) WITH PROPOFOL N/A 11/25/2014   Procedure: ESOPHAGOGASTRODUODENOSCOPY (EGD) WITH PROPOFOL;  Surgeon: Garlan Fair, MD;  [facility];  Service: Endoscopy;  Laterality: N/A;  . EYE SURGERY     cosmetic surgery"brow lift" 4'15  . MULTIPLE PROCEDURES FOR LEFT ARM     ELBOW,RIB FX PNEUMOTHORAX AFTER FALLING OFF MOUNTAIN  . PILONIDAL CYST / SINUS EXCISION    . RADICAL PROSTATECTOMY      SOCIAL HISTORY: Social History   Social History  . Marital status: Married    Spouse name: N/A  . Number of children: N/A  . Years of education: N/A   Occupational History  . Not on file.   Social History Main Topics  . Smoking status: Former Smoker    Packs/day: 1.00    Years:  40.00    Quit date: 11/29/1995  . Smokeless tobacco: Never Used  . Alcohol use Yes     Comment: 1 drink per day  . Drug use: No  . Sexual activity: Not on file   Other Topics Concern  . Not on file   Social History Narrative   CPAP 8 advanced   Patient drinks 5 cups of caffeine daily   Exercises 3-4 times weekly   Son is a pulmonologist    FAMILY HISTORY: Family History  Problem Relation Age of Onset  . Emphysema Father   . Asthma Father   . Lung cancer Father   . Breast cancer Sister   . Breast  cancer Sister   . Skin cancer Brother   . Prostate cancer Brother    Brother with a recent diagnosis of hemachromatosis Brother with CLL/SLL Mother had anemia, thalassemia. Hepatitis C with liver cancer  ALLERGIES:  is allergic to midazolam.  MEDICATIONS:  Current Outpatient Prescriptions  Medication Sig Dispense Refill  . amoxicillin-clavulanate (AUGMENTIN) 875-125 MG tablet Take 1 tablet by mouth 2 (two) times daily. 14 tablet 0  . calcium carbonate (TUMS - DOSED IN MG ELEMENTAL CALCIUM) 500 MG chewable tablet Chew 1 tablet by mouth at bedtime.     . ciprofloxacin (CIPRO) 500 MG tablet Take 1 tablet (500 mg total) by mouth 2 (two) times daily. 60 tablet 1  . magic mouthwash SOLN Take 5 mLs by mouth 4 (four) times daily as needed for mouth pain. 480 mL 0  . nitroGLYCERIN (NITROSTAT) 0.4 MG SL tablet Place 1 tablet (0.4 mg total) under the tongue every 5 (five) minutes as needed for chest pain. 25 tablet 1  . omeprazole (PRILOSEC) 40 MG capsule Take 40 mg by mouth daily before lunch. 1/2 hour before.    . ondansetron (ZOFRAN) 4 MG tablet Take 4 mg by mouth every 8 (eight) hours as needed for nausea or vomiting.     . senna-docusate (SENNA S) 8.6-50 MG tablet Take 2 tablets by mouth 2 (two) times daily. (Patient taking differently: Take 2 tablets by mouth 2 (two) times daily as needed for mild constipation or moderate constipation. ) 120 tablet 2  . valsartan (DIOVAN) 80 MG tablet Take 40 mg by mouth at bedtime.     . posaconazole (NOXAFIL) 100 MG TBEC delayed-release tablet Take 3 tablets (300 mg total) by mouth daily. 90 tablet 0  . valACYclovir (VALTREX) 500 MG tablet Take 1 tablet (500 mg total) by mouth 2 (two) times daily. At night to suppress herpes gingivitis 60 tablet 1   No current facility-administered medications for this visit.     REVIEW OF SYSTEMS:    10 Point review of Systems was done is negative except as noted above.  PHYSICAL EXAMINATION: ECOG PERFORMANCE STATUS: 1  - Symptomatic but completely ambulatory  . Vitals:   08/22/16 1446  BP: 124/67  Pulse: 75  Resp: 17  Temp: 98.5 F (36.9 C)   Filed Weights   08/22/16 1446  Weight: 163 lb 14.4 oz (74.3 kg)   .Body mass index is 25.67 kg/m.  GENERAL:alert, in no acute distress and comfortable SKIN: Resolving rash on his upper extremities EYES: normal, conjunctiva are pink and non-injected, sclera clear OROPHARYNX:no exudate, no erythema and lips, buccal mucosa, and tongue normal  NECK: supple, no JVD, thyroid normal size, non-tender, without nodularity LYMPH:  no palpable lymphadenopathy in the cervical, axillary or inguinal LUNGS: clear to auscultation With somewhat decreased entry bilaterally, no  wheezing HEART: regular rate & rhythm,  no murmurs and no lower extremity edema ABDOMEN: abdomen soft, non-tender, normoactive bowel sounds , no palpable hepatosplenomegaly Musculoskeletal: no cyanosis of digits. PSYCH: alert & oriented x 3 with fluent speech NEURO: no focal motor deficits.  LABORATORY DATA:  I have reviewed the data as listed  . CBC Latest Ref Rng & Units 08/22/2016 08/15/2016 08/08/2016  WBC 4.0 - 10.3 10e3/uL 1.1(L) 1.3(L) 1.4(L)  Hemoglobin 13.0 - 17.1 g/dL 8.3(L) 8.2(L) 8.8(L)  Hematocrit 38.4 - 49.9 % 28.4(L) 28.3(L) 30.1(L)  Platelets 140 - 400 10e3/uL 155 173 164   . CBC    Component Value Date/Time   WBC 1.1 (L) 08/22/2016 1433   WBC 0.9 (LL) 07/19/2016 0930   RBC 2.90 (L) 08/22/2016 1433   RBC 2.44 (L) 07/19/2016 0930   HGB 8.3 (L) 08/22/2016 1433   HCT 28.4 (L) 08/22/2016 1433   PLT 155 08/22/2016 1433   MCV 97.8 08/22/2016 1433   MCH 28.7 08/22/2016 1433   MCH 28.3 07/19/2016 0930   MCHC 29.4 (L) 08/22/2016 1433   MCHC 28.8 (L) 07/19/2016 0930   RDW 21.5 (H) 08/22/2016 1433   LYMPHSABS 1.0 08/22/2016 1433   MONOABS 0.0 (L) 08/22/2016 1433   EOSABS 0.0 08/22/2016 1433   BASOSABS 0.0 08/22/2016 1433    . CMP Latest Ref Rng & Units 08/22/2016 08/15/2016  08/08/2016  Glucose 70 - 140 mg/dl 103 130 93  BUN 7.0 - 26.0 mg/dL 17.0 21.8 23.5  Creatinine 0.7 - 1.3 mg/dL 1.1 1.2 1.1  Sodium 136 - 145 mEq/L 142 140 140  Potassium 3.5 - 5.1 mEq/L 4.3 4.4 4.3  Chloride 101 - 111 mmol/L - - -  CO2 22 - 29 mEq/L 25 24 25   Calcium 8.4 - 10.4 mg/dL 8.3(L) 8.6 8.7  Total Protein 6.4 - 8.3 g/dL 6.0(L) 6.1(L) 6.2(L)  Total Bilirubin 0.20 - 1.20 mg/dL 0.95 1.14 1.27(H)  Alkaline Phos 40 - 150 U/L 57 58 59  AST 5 - 34 U/L 26 24 22   ALT 0 - 55 U/L 18 17 16    ROSS AND MICROSCOPIC INFORMATION RADIOGRAPHIC STUDIES: I have personally reviewed the radiological images as listed and agreed with the findings in the report. No results found.  ASSESSMENT & PLAN:   75 year old Caucasian male with  1) Myelodysplastic syndrome (RAEB-2 with about 14-15% blasts) ISS-R score of 7 (Very High Risk) Cytogenetics show trisomy 8 (intermediate risk ) Presented with Subacute Anemia (with high normal MCV) + leukopenia/Neutropenia - new since September 2016. Patient has normal platelet counts at this time. Foundation one results reviewed patient has TET2, ASXL1, BCOR RUNX1 SETBP1 SRSF2 and STAG2 mutations identified. Repeat after his bone marrow suggested possibly some decrease in his blasts counts 7% by flow cytometry.  2) Anemia -  hemoglobin is relatively stable after his recent transfusion. No indication for PRBC transfusion today. 3) Neutropenia . Patient continues to have an [place] of 100-200 range with 60-70% bone marrow cellularity arguing against Vidaza effect. This appears to be primarily related to his RAEB 2. He had some chills and was in the emergency room. Has been started on ciprofloxacin. PLAN -Hemoglobin and platelet counts are stable today with no indication for additional PRBC transfusions. -Transfuse PRBCs (CMV neg and irradiated) when necessary for symptomatic anemia or if hemoglobin less than 7. We will use it irradiated and CMV negative products only. -We  shall continue his ciprofloxacin for neutropenic prophylaxis at this time. -Given his persistent profound neutropenia the  patient requires antifungal prophylaxis. He seems to have a gradually reappearing a rash with fluconazole. he is willing to be challenged with posaconazole to see if you might tolerate this without a worsening rash. He shall be applying for co-pay assistance given that even after insurance this medication is costing of nearly about 2000$ per months. -Continue Vidaza as per plan. Will be due for cycle 6 on 08/29/2016. -Continue weekly CBC to monitor for transfusion requirements.  -We shall see him back in 2 weeks. -Patient reports he has an appointment with Dr. Royce Macadamia next week. -Ongoing goals of care discussion.  4) Slightly elevated bilirubin level even before treatment (?GIlbert's) - stable  We'll continue to follow the patient every 2 weeks and have labs every week . Patient is agreeable to this plan of care.  I spent 25 minutes counseling the patient face to face. The total time spent in the appointment was 25 minutes and more than 50% was on counseling and direct patient cares.    Sullivan Lone MD [place] AAHIVMS [facility] Hematology/Oncology Physician [facility]  (Office):       [phone] (Work cell):  [phone] (Fax):           [phone]

## 2016-08-24 DIAGNOSIS — Z7982 Long term (current) use of aspirin: Secondary | ICD-10-CM | POA: Diagnosis not present

## 2016-08-24 DIAGNOSIS — Z9221 Personal history of antineoplastic chemotherapy: Secondary | ICD-10-CM | POA: Diagnosis not present

## 2016-08-24 DIAGNOSIS — Z6825 Body mass index (BMI) 25.0-25.9, adult: Secondary | ICD-10-CM | POA: Diagnosis not present

## 2016-08-24 DIAGNOSIS — D469 Myelodysplastic syndrome, unspecified: Secondary | ICD-10-CM | POA: Diagnosis not present

## 2016-08-24 DIAGNOSIS — Z79899 Other long term (current) drug therapy: Secondary | ICD-10-CM | POA: Diagnosis not present

## 2016-08-25 ENCOUNTER — Encounter: Payer: Self-pay | Admitting: Nurse Practitioner

## 2016-08-25 ENCOUNTER — Encounter: Payer: Self-pay | Admitting: *Deleted

## 2016-08-25 ENCOUNTER — Ambulatory Visit (HOSPITAL_BASED_OUTPATIENT_CLINIC_OR_DEPARTMENT_OTHER): Payer: Medicare Other | Admitting: Nurse Practitioner

## 2016-08-25 VITALS — BP 123/60 | HR 71 | Temp 98.8°F | Resp 17 | Ht 67.0 in | Wt 165.0 lb

## 2016-08-25 DIAGNOSIS — D469 Myelodysplastic syndrome, unspecified: Secondary | ICD-10-CM | POA: Insufficient documentation

## 2016-08-25 DIAGNOSIS — L03116 Cellulitis of left lower limb: Secondary | ICD-10-CM | POA: Diagnosis not present

## 2016-08-25 DIAGNOSIS — T148XXA Other injury of unspecified body region, initial encounter: Secondary | ICD-10-CM | POA: Insufficient documentation

## 2016-08-25 DIAGNOSIS — L03119 Cellulitis of unspecified part of limb: Secondary | ICD-10-CM

## 2016-08-25 DIAGNOSIS — S60450A Superficial foreign body of right index finger, initial encounter: Secondary | ICD-10-CM | POA: Diagnosis not present

## 2016-08-25 MED ORDER — CEPHALEXIN 500 MG PO CAPS: 500.0000 mg | ORAL_CAPSULE | Freq: Four times a day (QID) | ORAL | 0 refills | Status: DC

## 2016-08-25 NOTE — Assessment & Plan Note (Signed)
Patient received cycle 5, day 7 of his Velcade injections on 08/10/2016.  He states that he was seen at [facility] just yesterday; and he was told his [place] at that time was 0.1.  Dr. Irene Limbo in to discuss patient's [facility].  Visit from yesterday; and to also review options of clinical trials versus transplant.  Patient is scheduled to return for labs and his next Velcade injection on 08/29/2016.  He is scheduled for labs, visit, Velcade again on 09/05/2016.

## 2016-08-25 NOTE — Progress Notes (Signed)
SYMPTOM MANAGEMENT CLINIC    Chief Complaint: Cellulitis  HPI:  Warren [name] 75 y.o. male diagnosed with MDS.  Currently undergoing Velcade injections.    No history exists.    Review of Systems  Skin:       Left lateral ankle area lightest and splinter to right index finger.  All other systems reviewed and are negative.   Past Medical History:  Diagnosis Date  . Cancer [facility])    Prostate, Melanoma- lt. Shoulder (no further problems since 2010  . Complication of anesthesia    versed gave the adverse reaction pt. ,became aggitated  . COPD (chronic obstructive pulmonary disease) (HCC)    Dr. Annamaria Boots follows  . Diffuse esophageal spasm   . Diverticulosis   . DJD (degenerative joint disease)   . Multiple trauma     horse accident 2003 L SHOULDER  . Oral herpes   . OSA (obstructive sleep apnea)    mild, rare cpap use  . Peripheral neuropathy ([place])   . Pulmonary nodule   . Spinal stenosis   . Systolic hypertension     Past Surgical History:  Procedure Laterality Date  . ,laceration to head in accident with hourse    . ACOUSTIC NEUROMA LEFT EAR  1999  . BALLOON DILATION N/A 11/25/2014   Procedure: BALLOON DILATION;  Surgeon: Garlan Fair, MD;  Location[facility];  Service: Endoscopy;  Laterality: N/A;  . COLONOSCOPY WITH PROPOFOL N/A 10/01/2013   Procedure: COLONOSCOPY WITH PROPOFOL;  Surgeon: Garlan Fair, MD;  [facility];  Service: Endoscopy;  Laterality: N/A;  . ESOPHAGOGASTRODUODENOSCOPY (EGD) WITH PROPOFOL N/A 11/25/2014   Procedure: ESOPHAGOGASTRODUODENOSCOPY (EGD) WITH PROPOFOL;  Surgeon: Garlan Fair, MD;  [facility];  Service: Endoscopy;  Laterality: N/A;  . EYE SURGERY     cosmetic surgery"brow lift" 4'15  . MULTIPLE PROCEDURES FOR LEFT ARM     ELBOW,RIB FX PNEUMOTHORAX AFTER FALLING OFF MOUNTAIN  . PILONIDAL CYST / SINUS EXCISION    . RADICAL PROSTATECTOMY      has OSA (obstructive sleep apnea); PULMONARY NODULE;  Acute bronchitis; Atherosclerosis of aorta ([place]); Benign essential HTN; Coronary artery calcification; Dyslipidemia; CAD (coronary artery disease), native coronary artery; Dyspnea on exertion; Neutropenia ([place]); Anemia; Leukopenia; Pancytopenia ([place]); History of blood transfusion; Thrombocytopenia ([place]); Neutropenia, unspecified ([place]); RAEB-2 (refractory anemia with excess blasts-2) ([place]); Leucopenia; Iron deficiency anemia; Mucositis due to antineoplastic therapy; Chills without fever; Skin rash; MDS (myelodysplastic syndrome) ([place]); Ankle cellulitis; and Splinter on his problem list.    is allergic to fluconazole and midazolam.    Medication List       Accurate as of 08/25/16  4:19 PM. Always use your most recent med list.          amoxicillin-clavulanate 875-125 MG tablet Commonly known as:  AUGMENTIN Take 1 tablet by mouth 2 (two) times daily.   calcium carbonate 500 MG chewable tablet Commonly known as:  TUMS - dosed in mg elemental calcium Chew 1 tablet by mouth at bedtime.   cephALEXin 500 MG capsule Commonly known as:  KEFLEX Take 1 capsule (500 mg total) by mouth 4 (four) times daily.   ciprofloxacin 500 MG tablet Commonly known as:  CIPRO Take 1 tablet (500 mg total) by mouth 2 (two) times daily.   magic mouthwash Soln Take 5 mLs by mouth 4 (four) times daily as needed for mouth pain.   nitroGLYCERIN 0.4 MG SL tablet Commonly known as:  NITROSTAT Place 1 tablet (0.4  mg total) under the tongue every 5 (five) minutes as needed for chest pain.   omeprazole 40 MG capsule Commonly known as:  PRILOSEC Take 40 mg by mouth daily before lunch. 1/2 hour before.   ondansetron 4 MG tablet Commonly known as:  ZOFRAN Take 4 mg by mouth every 8 (eight) hours as needed for nausea or vomiting.   posaconazole 100 MG Tbec delayed-release tablet Commonly known as:  NOXAFIL Take 3 tablets (300 mg total) by mouth daily.   senna-docusate 8.6-50 MG tablet Commonly known as:  SENNA  S Take 2 tablets by mouth 2 (two) times daily.   valACYclovir 500 MG tablet Commonly known as:  VALTREX Take 1 tablet (500 mg total) by mouth 2 (two) times daily. At night to suppress herpes gingivitis   valsartan 80 MG tablet Commonly known as:  DIOVAN Take 40 mg by mouth at bedtime.        PHYSICAL EXAMINATION  Oncology Vitals 08/25/2016 08/22/2016  Height 170 cm 170 cm  Weight 74.844 kg 74.345 kg  Weight (lbs) 165 lbs 163 lbs 14 oz  BMI (kg/m2) 25.84 kg/m2 25.67 kg/m2  Temp 98.8 98.5  Pulse 71 75  Resp 17 17  SpO2 100 100  BSA (m2) 1.88 m2 1.87 m2   BP Readings from Last 2 Encounters:  08/25/16 123/60  08/22/16 124/67    Physical Exam  Constitutional: He is oriented to person, place, and time and well-developed, well-nourished, and in no distress.  HENT:  Head: Normocephalic and atraumatic.  Eyes: Conjunctivae and EOM are normal. Pupils are equal, round, and reactive to light.  Neck: Normal range of motion.  Pulmonary/Chest: No respiratory distress.  Musculoskeletal: Normal range of motion. He exhibits edema.  Neurological: He is alert and oriented to person, place, and time. Gait normal.  Skin: Skin is warm and dry. There is erythema.  Exam today reveals approximately a 2 cm scabbed laceration to the left lateral ankle.  There is some mild surrounding erythema; but no obvious warmth, or red streaks.  Patient also has a tiny speck of a splinter to the tip of his right index finger was; with no evidence of infection.  [name]   Psychiatric: Affect normal.  Nursing note and vitals reviewed.   LABORATORY DATA:. No visits with results within 3 Day(s) from this visit.  Latest known visit with results is:  Appointment on 08/22/2016  Component Date Value Ref Range Status  . WBC 08/22/2016 1.1* 4.0 - 10.3 10e3/uL Final  . NEUT# 08/22/2016 0.1* 1.5 - 6.5 10e3/uL Final  . HGB 08/22/2016 8.3* 13.0 - 17.1 g/dL Final  . HCT 08/22/2016 28.4* 38.4 - 49.9 % Final  . Platelets  08/22/2016 155  140 - 400 10e3/uL Final  . MCV 08/22/2016 97.8  79.3 - 98.0 fL Final  . MCH 08/22/2016 28.7  27.2 - 33.4 pg Final  . MCHC 08/22/2016 29.4* 32.0 - 36.0 g/dL Final  . RBC 08/22/2016 2.90* 4.20 - 5.82 10e6/uL Final  . RDW 08/22/2016 21.5* 11.0 - 14.6 % Final  . lymph# 08/22/2016 1.0  0.9 - 3.3 10e3/uL Final  . MONO# 08/22/2016 0.0* 0.1 - 0.9 10e3/uL Final  . Eosinophils Absolute 08/22/2016 0.0  0.0 - 0.5 10e3/uL Final  . Basophils Absolute 08/22/2016 0.0  0.0 - 0.1 10e3/uL Final  . NEUT% 08/22/2016 5.4* 39.0 - 75.0 % Final  . LYMPH% 08/22/2016 90.8* 14.0 - 49.0 % Final  . MONO% 08/22/2016 1.9  0.0 - 14.0 % Final  .  EOS% 08/22/2016 1.8  0.0 - 7.0 % Final  . BASO% 08/22/2016 0.1  0.0 - 2.0 % Final  . Sodium 08/22/2016 142  136 - 145 mEq/L Final  . Potassium 08/22/2016 4.3  3.5 - 5.1 mEq/L Final  . Chloride 08/22/2016 110* 98 - 109 mEq/L Final  . CO2 08/22/2016 25  22 - 29 mEq/L Final  . Glucose 08/22/2016 103  70 - 140 mg/dl Final  . BUN 08/22/2016 17.0  7.0 - 26.0 mg/dL Final  . Creatinine 08/22/2016 1.1  0.7 - 1.3 mg/dL Final  . Total Bilirubin 08/22/2016 0.95  0.20 - 1.20 mg/dL Final  . Alkaline Phosphatase 08/22/2016 57  40 - 150 U/L Final  . AST 08/22/2016 26  5 - 34 U/L Final  . ALT 08/22/2016 18  0 - 55 U/L Final  . Total Protein 08/22/2016 6.0* 6.4 - 8.3 g/dL Final  . Albumin 08/22/2016 3.2* 3.5 - 5.0 g/dL Final  . Calcium 08/22/2016 8.3* 8.4 - 10.4 mg/dL Final  . Anion Gap 08/22/2016 7  3 - 11 mEq/L Final  . EGFR 08/22/2016 67* >90 ml/min/1.73 m2 Final   Left lateral ankle:     RADIOGRAPHIC STUDIES: No results found.  ASSESSMENT/PLAN:    Splinter Patient has a tiny splinter to the tip of his right index finger.  He denies any tenderness to the site.  Exam today reveals pinpoint splinter to the tip of the right index finger.  There is no surrounding erythema, edema, warmth, or red streaks.  There is also no tenderness on exam.  Due to patient's   significant neutropenia with an ANC of 0.1 per [facility] labs just yesterday-.  Will hold any attempt to remove the splinter manually at this time.  Instead-patient was advised to soak his finger in warm salt water several times per day; and he could also use Neosporin ointment to the site wrapped with a Band-Aid.  Advised patient that this may soften the fingertip enough that he would be able to gently manipulate the splinter out.  Patient was also advised to call/return or go directly to the emergency department for any worsening symptoms whatsoever.  MDS (myelodysplastic syndrome) ([place]) Patient received cycle 5, day 7 of his Velcade injections on 08/10/2016.  He states that he was seen at [place] just yesterday; and he was told his [place] at that time was 0.1.  Dr. Irene Limbo in to discuss patient's [facility].  Visit from yesterday; and to also review options of clinical trials versus transplant.  Patient is scheduled to return for labs and his next Velcade injection on 08/29/2016.  He is scheduled for labs, visit, Velcade again on 09/05/2016.  Ankle cellulitis Patient states that he developed a scratch/laceration to the left lateral ankle this past Tuesday, 08/23/2016.  He is unsure exactly how he injured himself.  He presented to the [place] today with complaint of mild to moderate edema, erythema, warmth, and tenderness to the site.  He states that he has spoken to his son who is a pulmonologist in [place]; and also spoken to an infectious disease doctor as well.  All have recommended that patient consider adding Keflex antibiotics for treatment of cellulitis.  Exam today reveals approximately a 2 cm scabbed laceration to the left lateral ankle.  There is some mild surrounding erythema; but no obvious warmth, or red streaks.  Vital signs were stable and patient was afebrile with temperature 98.8.  Reviewed all findings with  Dr. Irene Limbo; and  he was in agreement  that patient should be initiated on Augmentin antibiotics.  He should also continue to take the Cipro as directed.  Patient was advised to call/return or go directly to the emergency department for any worsening symptoms whatsoever.      Patient stated understanding of all instructions; and was in agreement with this plan of care. The patient knows to call the clinic with any problems, questions or concerns.   Total time spent with patient was 25 minutes;  with greater than 75 percent of that time spent in face to face counseling regarding patient's symptoms,  and coordination of care and follow up.  Disclaimer:This dictation was prepared with Dragon/digital dictation along with [employer]. Any transcriptional errors that result from this process are unintentional.  Drue Second, NP 08/25/2016    Addendum  Patient was seen and examined personally and case was discussed in detail with Retta Mac NP. About documentation reflects our joint assessment plan.  Sullivan Lone MD [place] AAHIVMS [facility] Hematology/Oncology Physician [place]  (Office):       [phone] (Work cell):  [phone] (Fax):           [phone]

## 2016-08-25 NOTE — Assessment & Plan Note (Signed)
Patient has a tiny splinter to the tip of his right index finger.  He denies any tenderness to the site.  Exam today reveals pinpoint splinter to the tip of the right index finger.  There is no surrounding erythema, edema, warmth, or red streaks.  There is also no tenderness on exam.  Due to patient's  significant neutropenia with an ANC of 0.1 per [facility] labs just yesterday-.  Will hold any attempt to remove the splinter manually at this time.  Instead-patient was advised to soak his finger in warm salt water several times per day; and he could also use Neosporin ointment to the site wrapped with a Band-Aid.  Advised patient that this may soften the fingertip enough that he would be able to gently manipulate the splinter out.  Patient was also advised to call/return or go directly to the emergency department for any worsening symptoms whatsoever.

## 2016-08-25 NOTE — Assessment & Plan Note (Signed)
Patient states that he developed a scratch/laceration to the left lateral ankle this past Tuesday, 08/23/2016.  He is unsure exactly how he injured himself.  He presented to the [facility] today with complaint of mild to moderate edema, erythema, warmth, and tenderness to the site.  He states that he has spoken to his son who is a pulmonologist in [place]; and also spoken to an infectious disease doctor as well.  All have recommended that patient consider adding Keflex antibiotics for treatment of cellulitis.  Exam today reveals approximately a 2 cm scabbed laceration to the left lateral ankle.  There is some mild surrounding erythema; but no obvious warmth, or red streaks.  Vital signs were stable and patient was afebrile with temperature 98.8.  Reviewed all findings with Dr. Irene Limbo; and  he was in agreement that patient should be initiated on Augmentin antibiotics.  He should also continue to take the Cipro as directed.  Patient was advised to call/return or go directly to the emergency department for any worsening symptoms whatsoever.

## 2016-08-25 NOTE — Progress Notes (Signed)
Patient called 9/27 stating that he noticed a scratch on the knee (no fall stated) that was swollen and looked red in color. RN instructed patient to keep it clean and apply cool compress to area. Patient called back 9/28 stating that scratch has not gotten worse, but would like for someone to look at it. MD [facility] notified. Selena Lesser NP will be seeing patient today for evaluation.

## 2016-08-29 ENCOUNTER — Other Ambulatory Visit (HOSPITAL_BASED_OUTPATIENT_CLINIC_OR_DEPARTMENT_OTHER): Payer: Medicare Other

## 2016-08-29 ENCOUNTER — Encounter: Payer: Self-pay | Admitting: *Deleted

## 2016-08-29 ENCOUNTER — Ambulatory Visit (HOSPITAL_BASED_OUTPATIENT_CLINIC_OR_DEPARTMENT_OTHER): Payer: Medicare Other

## 2016-08-29 VITALS — BP 113/71 | HR 62 | Temp 98.1°F | Resp 18

## 2016-08-29 DIAGNOSIS — D4622 Refractory anemia with excess of blasts 2: Secondary | ICD-10-CM

## 2016-08-29 DIAGNOSIS — Z5111 Encounter for antineoplastic chemotherapy: Secondary | ICD-10-CM | POA: Diagnosis present

## 2016-08-29 LAB — COMPREHENSIVE METABOLIC PANEL
ALBUMIN: 3.3 g/dL — AB (ref 3.5–5.0)
ALT: 22 U/L (ref 0–55)
AST: 29 U/L (ref 5–34)
Alkaline Phosphatase: 63 U/L (ref 40–150)
Anion Gap: 7 mEq/L (ref 3–11)
BUN: 20.8 mg/dL (ref 7.0–26.0)
CO2: 25 meq/L (ref 22–29)
Calcium: 8.5 mg/dL (ref 8.4–10.4)
Chloride: 109 mEq/L (ref 98–109)
Creatinine: 1.1 mg/dL (ref 0.7–1.3)
EGFR: 63 mL/min/{1.73_m2} — AB (ref >90)
GLUCOSE: 116 mg/dL (ref 70–140)
POTASSIUM: 4 meq/L (ref 3.5–5.1)
SODIUM: 141 meq/L (ref 136–145)
Total Bilirubin: 1.05 mg/dL (ref 0.20–1.20)
Total Protein: 6.3 g/dL — ABNORMAL LOW (ref 6.4–8.3)

## 2016-08-29 LAB — CBC & DIFF AND RETIC
BASO%: 0 % (ref 0.0–2.0)
BASOS ABS: 0 10*3/uL (ref 0.0–0.1)
EOS ABS: 0 10*3/uL (ref 0.0–0.5)
EOS%: 0.8 % (ref 0.0–7.0)
HEMATOCRIT: 30.3 % — AB (ref 38.4–49.9)
HEMOGLOBIN: 8.9 g/dL — AB (ref 13.0–17.1)
IMMATURE RETIC FRACT: 35.6 % — AB (ref 3.00–10.60)
LYMPH#: 1.2 10*3/uL (ref 0.9–3.3)
LYMPH%: 89.9 % — AB (ref 14.0–49.0)
MCH: 28.7 pg (ref 27.2–33.4)
MCHC: 29.4 g/dL — ABNORMAL LOW (ref 32.0–36.0)
MCV: 97.7 fL (ref 79.3–98.0)
MONO#: 0.1 10*3/uL (ref 0.1–0.9)
MONO%: 3.9 % (ref 0.0–14.0)
NEUT#: 0.1 10*3/uL — CL (ref 1.5–6.5)
NEUT%: 5.4 % — AB (ref 39.0–75.0)
PLATELETS: 147 10*3/uL (ref 140–400)
RBC: 3.1 10*6/uL — AB (ref 4.20–5.82)
RDW: 20.6 % — ABNORMAL HIGH (ref 11.0–14.6)
RETIC CT ABS: 121.21 10*3/uL — AB (ref 34.80–93.90)
Retic %: 3.91 % — ABNORMAL HIGH (ref 0.80–1.80)
WBC: 1.3 10*3/uL — ABNORMAL LOW (ref 4.0–10.3)

## 2016-08-29 MED ORDER — ONDANSETRON HCL 8 MG PO TABS
ORAL_TABLET | ORAL | Status: AC
  Filled 2016-08-29: qty 1

## 2016-08-29 MED ORDER — AZACITIDINE CHEMO SQ INJECTION
76.0000 mg/m2 | Freq: Once | INTRAMUSCULAR | Status: AC
  Administered 2016-08-29: 145 mg via SUBCUTANEOUS
  Filled 2016-08-29: qty 5.8

## 2016-08-29 MED ORDER — ONDANSETRON HCL 8 MG PO TABS
8.0000 mg | ORAL_TABLET | Freq: Once | ORAL | Status: AC
  Administered 2016-08-29: 8 mg via ORAL

## 2016-08-29 NOTE — Progress Notes (Signed)
Okay to treat with ANC 0.1 per MD Irene Limbo

## 2016-08-30 ENCOUNTER — Ambulatory Visit (HOSPITAL_BASED_OUTPATIENT_CLINIC_OR_DEPARTMENT_OTHER): Payer: Medicare Other

## 2016-08-30 VITALS — BP 104/59 | HR 66 | Temp 98.8°F | Resp 18

## 2016-08-30 DIAGNOSIS — Z5111 Encounter for antineoplastic chemotherapy: Secondary | ICD-10-CM

## 2016-08-30 DIAGNOSIS — D4622 Refractory anemia with excess of blasts 2: Secondary | ICD-10-CM | POA: Diagnosis not present

## 2016-08-30 MED ORDER — AZACITIDINE CHEMO SQ INJECTION
76.0000 mg/m2 | Freq: Once | INTRAMUSCULAR | Status: AC
  Administered 2016-08-30: 145 mg via SUBCUTANEOUS
  Filled 2016-08-30: qty 5.8

## 2016-08-30 MED ORDER — ONDANSETRON HCL 8 MG PO TABS
8.0000 mg | ORAL_TABLET | Freq: Once | ORAL | Status: AC
  Administered 2016-08-30: 8 mg via ORAL

## 2016-08-30 MED ORDER — ONDANSETRON HCL 8 MG PO TABS
ORAL_TABLET | ORAL | Status: AC
  Filled 2016-08-30: qty 1

## 2016-08-31 ENCOUNTER — Ambulatory Visit (HOSPITAL_BASED_OUTPATIENT_CLINIC_OR_DEPARTMENT_OTHER): Payer: Medicare Other

## 2016-08-31 VITALS — BP 106/65 | HR 70 | Temp 98.2°F | Resp 16

## 2016-08-31 DIAGNOSIS — D4622 Refractory anemia with excess of blasts 2: Secondary | ICD-10-CM

## 2016-08-31 DIAGNOSIS — Z5111 Encounter for antineoplastic chemotherapy: Secondary | ICD-10-CM

## 2016-08-31 MED ORDER — ONDANSETRON HCL 8 MG PO TABS
ORAL_TABLET | ORAL | Status: AC
  Filled 2016-08-31: qty 1

## 2016-08-31 MED ORDER — ONDANSETRON HCL 8 MG PO TABS
8.0000 mg | ORAL_TABLET | Freq: Once | ORAL | Status: AC
  Administered 2016-08-31: 8 mg via ORAL

## 2016-08-31 MED ORDER — AZACITIDINE CHEMO SQ INJECTION
145.0000 mg | Freq: Once | INTRAMUSCULAR | Status: AC
  Administered 2016-08-31: 145 mg via SUBCUTANEOUS
  Filled 2016-08-31: qty 1.8

## 2016-08-31 NOTE — Patient Instructions (Signed)
[facility] Discharge Instructions for Patients Receiving Chemotherapy  Today you received the following chemotherapy agents: Vidaza   To help prevent nausea and vomiting after your treatment, we encourage you to take your nausea medication as directed.    If you develop nausea and vomiting that is not controlled by your nausea medication, call the clinic.   BELOW ARE SYMPTOMS THAT SHOULD BE REPORTED IMMEDIATELY:  *FEVER GREATER THAN 100.5 F  *CHILLS WITH OR WITHOUT FEVER  NAUSEA AND VOMITING THAT IS NOT CONTROLLED WITH YOUR NAUSEA MEDICATION  *UNUSUAL SHORTNESS OF BREATH  *UNUSUAL BRUISING OR BLEEDING  TENDERNESS IN MOUTH AND THROAT WITH OR WITHOUT PRESENCE OF ULCERS  *URINARY PROBLEMS  *BOWEL PROBLEMS  UNUSUAL RASH Items with * indicate a potential emergency and should be followed up as soon as possible.  Feel free to call the clinic you have any questions or concerns. The clinic phone number is [phone].  Please show the CHEMO ALERT CARD at check-in to the Emergency Department and triage nurse.   

## 2016-09-01 ENCOUNTER — Ambulatory Visit (HOSPITAL_BASED_OUTPATIENT_CLINIC_OR_DEPARTMENT_OTHER): Payer: Medicare Other

## 2016-09-01 VITALS — BP 119/66 | HR 73 | Temp 98.5°F | Resp 20

## 2016-09-01 DIAGNOSIS — Z5111 Encounter for antineoplastic chemotherapy: Secondary | ICD-10-CM | POA: Diagnosis present

## 2016-09-01 DIAGNOSIS — D4622 Refractory anemia with excess of blasts 2: Secondary | ICD-10-CM | POA: Diagnosis not present

## 2016-09-01 MED ORDER — ONDANSETRON HCL 8 MG PO TABS
ORAL_TABLET | ORAL | Status: AC
  Filled 2016-09-01: qty 1

## 2016-09-01 MED ORDER — AZACITIDINE CHEMO SQ INJECTION
76.0000 mg/m2 | Freq: Once | INTRAMUSCULAR | Status: AC
  Administered 2016-09-01: 145 mg via SUBCUTANEOUS
  Filled 2016-09-01: qty 5.8

## 2016-09-01 MED ORDER — ONDANSETRON HCL 8 MG PO TABS
8.0000 mg | ORAL_TABLET | Freq: Once | ORAL | Status: AC
  Administered 2016-09-01: 8 mg via ORAL

## 2016-09-01 NOTE — Patient Instructions (Addendum)
[facility] Discharge Instructions for Patients Receiving Chemotherapy  Today you received the following chemotherapy agents: Vidaza   To help prevent nausea and vomiting after your treatment, we encourage you to take your nausea medication as directed.    If you develop nausea and vomiting that is not controlled by your nausea medication, call the clinic.   BELOW ARE SYMPTOMS THAT SHOULD BE REPORTED IMMEDIATELY:  *FEVER GREATER THAN 100.5 F  *CHILLS WITH OR WITHOUT FEVER  NAUSEA AND VOMITING THAT IS NOT CONTROLLED WITH YOUR NAUSEA MEDICATION  *UNUSUAL SHORTNESS OF BREATH  *UNUSUAL BRUISING OR BLEEDING  TENDERNESS IN MOUTH AND THROAT WITH OR WITHOUT PRESENCE OF ULCERS  *URINARY PROBLEMS  *BOWEL PROBLEMS  UNUSUAL RASH Items with * indicate a potential emergency and should be followed up as soon as possible.  Feel free to call the clinic you have any questions or concerns. The clinic phone number is [phone].  Please show the CHEMO ALERT CARD at check-in to the Emergency Department and triage nurse.   

## 2016-09-02 ENCOUNTER — Ambulatory Visit (HOSPITAL_BASED_OUTPATIENT_CLINIC_OR_DEPARTMENT_OTHER): Payer: Medicare Other

## 2016-09-02 VITALS — BP 103/60 | HR 72 | Temp 98.4°F | Resp 18

## 2016-09-02 DIAGNOSIS — Z5111 Encounter for antineoplastic chemotherapy: Secondary | ICD-10-CM | POA: Diagnosis present

## 2016-09-02 DIAGNOSIS — D4622 Refractory anemia with excess of blasts 2: Secondary | ICD-10-CM | POA: Diagnosis not present

## 2016-09-02 MED ORDER — ONDANSETRON HCL 8 MG PO TABS
8.0000 mg | ORAL_TABLET | Freq: Once | ORAL | Status: AC
  Administered 2016-09-02: 8 mg via ORAL

## 2016-09-02 MED ORDER — ONDANSETRON HCL 8 MG PO TABS
ORAL_TABLET | ORAL | Status: AC
  Filled 2016-09-02: qty 1

## 2016-09-02 MED ORDER — AZACITIDINE CHEMO SQ INJECTION
75.5000 mg/m2 | Freq: Once | INTRAMUSCULAR | Status: AC
  Administered 2016-09-02: 145 mg via SUBCUTANEOUS
  Filled 2016-09-02: qty 5.8

## 2016-09-02 NOTE — Patient Instructions (Signed)
[facility] Discharge Instructions for Patients Receiving Chemotherapy  Today you received the following chemotherapy agents VIDAZA To help prevent nausea and vomiting after your treatment, we encourage you to take your nausea medication as prescribed.   If you develop nausea and vomiting that is not controlled by your nausea medication, call the clinic.   BELOW ARE SYMPTOMS THAT SHOULD BE REPORTED IMMEDIATELY:  *FEVER GREATER THAN 100.5 F  *CHILLS WITH OR WITHOUT FEVER  NAUSEA AND VOMITING THAT IS NOT CONTROLLED WITH YOUR NAUSEA MEDICATION  *UNUSUAL SHORTNESS OF BREATH  *UNUSUAL BRUISING OR BLEEDING  TENDERNESS IN MOUTH AND THROAT WITH OR WITHOUT PRESENCE OF ULCERS  *URINARY PROBLEMS  *BOWEL PROBLEMS  UNUSUAL RASH Items with * indicate a potential emergency and should be followed up as soon as possible.  Feel free to call the clinic you have any questions or concerns. The clinic phone number is [phone].  Please show the CHEMO ALERT CARD at check-in to the Emergency Department and triage nurse.   

## 2016-09-05 ENCOUNTER — Ambulatory Visit (HOSPITAL_BASED_OUTPATIENT_CLINIC_OR_DEPARTMENT_OTHER): Payer: Medicare Other

## 2016-09-05 ENCOUNTER — Ambulatory Visit (HOSPITAL_BASED_OUTPATIENT_CLINIC_OR_DEPARTMENT_OTHER): Payer: Medicare Other | Admitting: Hematology

## 2016-09-05 ENCOUNTER — Encounter: Payer: Self-pay | Admitting: Hematology

## 2016-09-05 ENCOUNTER — Other Ambulatory Visit: Payer: Self-pay | Admitting: Hematology

## 2016-09-05 ENCOUNTER — Other Ambulatory Visit (HOSPITAL_BASED_OUTPATIENT_CLINIC_OR_DEPARTMENT_OTHER): Payer: Medicare Other

## 2016-09-05 ENCOUNTER — Telehealth: Payer: Self-pay | Admitting: Hematology

## 2016-09-05 VITALS — BP 121/60 | HR 69 | Temp 98.4°F | Resp 18 | Wt 167.9 lb

## 2016-09-05 DIAGNOSIS — D61818 Other pancytopenia: Secondary | ICD-10-CM

## 2016-09-05 DIAGNOSIS — D709 Neutropenia, unspecified: Secondary | ICD-10-CM

## 2016-09-05 DIAGNOSIS — D4622 Refractory anemia with excess of blasts 2: Secondary | ICD-10-CM

## 2016-09-05 DIAGNOSIS — R5383 Other fatigue: Secondary | ICD-10-CM

## 2016-09-05 DIAGNOSIS — D509 Iron deficiency anemia, unspecified: Secondary | ICD-10-CM | POA: Diagnosis not present

## 2016-09-05 DIAGNOSIS — Z5111 Encounter for antineoplastic chemotherapy: Secondary | ICD-10-CM

## 2016-09-05 LAB — CBC & DIFF AND RETIC
BASO%: 0 % (ref 0.0–2.0)
Basophils Absolute: 0 10*3/uL (ref 0.0–0.1)
EOS%: 2.6 % (ref 0.0–7.0)
Eosinophils Absolute: 0 10*3/uL (ref 0.0–0.5)
HCT: 28.3 % — ABNORMAL LOW (ref 38.4–49.9)
HGB: 8.3 g/dL — ABNORMAL LOW (ref 13.0–17.1)
Immature Retic Fract: 31.3 % — ABNORMAL HIGH (ref 3.00–10.60)
LYMPH#: 1 10*3/uL (ref 0.9–3.3)
LYMPH%: 85.1 % — ABNORMAL HIGH (ref 14.0–49.0)
MCH: 28.2 pg (ref 27.2–33.4)
MCHC: 29.3 g/dL — AB (ref 32.0–36.0)
MCV: 96.3 fL (ref 79.3–98.0)
MONO#: 0 10*3/uL — AB (ref 0.1–0.9)
MONO%: 2.6 % (ref 0.0–14.0)
NEUT%: 9.7 % — ABNORMAL LOW (ref 39.0–75.0)
NEUTROS ABS: 0.1 10*3/uL — AB (ref 1.5–6.5)
PLATELETS: 113 10*3/uL — AB (ref 140–400)
RBC: 2.94 10*6/uL — AB (ref 4.20–5.82)
RDW: 19.8 % — ABNORMAL HIGH (ref 11.0–14.6)
RETIC %: 2.49 % — AB (ref 0.80–1.80)
RETIC CT ABS: 73.21 10*3/uL (ref 34.80–93.90)
WBC: 1.1 10*3/uL — AB (ref 4.0–10.3)
nRBC: 3 % — ABNORMAL HIGH (ref 0–0)

## 2016-09-05 MED ORDER — AZACITIDINE CHEMO SQ INJECTION
76.0000 mg/m2 | Freq: Once | INTRAMUSCULAR | Status: AC
  Administered 2016-09-05: 145 mg via SUBCUTANEOUS
  Filled 2016-09-05: qty 5.8

## 2016-09-05 MED ORDER — ONDANSETRON HCL 8 MG PO TABS
8.0000 mg | ORAL_TABLET | Freq: Once | ORAL | Status: AC
Start: 2016-09-05 — End: 2016-09-05
  Administered 2016-09-05: 8 mg via ORAL

## 2016-09-05 MED ORDER — ONDANSETRON HCL 8 MG PO TABS
ORAL_TABLET | ORAL | Status: AC
  Filled 2016-09-05: qty 1

## 2016-09-05 MED ORDER — ISAVUCONAZONIUM SULFATE 186 MG PO CAPS: 186.0000 mg | ORAL_CAPSULE | Freq: Every day | ORAL | 1 refills | Status: DC

## 2016-09-05 NOTE — Telephone Encounter (Signed)
NO 10/9 LOS/ORDERS/REFERRALS.   PER PATIENT HE IS TO HAVE A BX VIA RADIOLOGY AND WAS TOLD TO COME TO SCHEDULING FOR APPOINTMENT.  LEFT MESSAGE FOR DESK NURSE - NO LOS AND NO BX ORDER.  ONCE BX ORDER ENTERED [facility] WILL CALL RE APPOINTMENT. ANY ADDITIONAL APPOINTMENT INFORMATION FOR [facility] WILL NEED TO BE SENT VIA SCHEDULE MESSAGE INBOX.

## 2016-09-05 NOTE — Patient Instructions (Signed)
[facility] Discharge Instructions for Patients Receiving Chemotherapy  Today you received the following chemotherapy agents:  Vidaza  To help prevent nausea and vomiting after your treatment, we encourage you to take your nausea medication as ordered per MD.    If you develop nausea and vomiting that is not controlled by your nausea medication, call the clinic.   BELOW ARE SYMPTOMS THAT SHOULD BE REPORTED IMMEDIATELY:  *FEVER GREATER THAN 100.5 F  *CHILLS WITH OR WITHOUT FEVER  NAUSEA AND VOMITING THAT IS NOT CONTROLLED WITH YOUR NAUSEA MEDICATION  *UNUSUAL SHORTNESS OF BREATH  *UNUSUAL BRUISING OR BLEEDING  TENDERNESS IN MOUTH AND THROAT WITH OR WITHOUT PRESENCE OF ULCERS  *URINARY PROBLEMS  *BOWEL PROBLEMS  UNUSUAL RASH Items with * indicate a potential emergency and should be followed up as soon as possible.  Feel free to call the clinic you have any questions or concerns. The clinic phone number is [phone].  Please show the CHEMO ALERT CARD at check-in to the Emergency Department and triage nurse.   

## 2016-09-06 ENCOUNTER — Ambulatory Visit (HOSPITAL_BASED_OUTPATIENT_CLINIC_OR_DEPARTMENT_OTHER): Payer: Medicare Other

## 2016-09-06 VITALS — BP 119/64 | HR 67 | Temp 98.3°F | Resp 17

## 2016-09-06 DIAGNOSIS — D4622 Refractory anemia with excess of blasts 2: Secondary | ICD-10-CM

## 2016-09-06 DIAGNOSIS — Z5111 Encounter for antineoplastic chemotherapy: Secondary | ICD-10-CM

## 2016-09-06 MED ORDER — ONDANSETRON HCL 8 MG PO TABS
ORAL_TABLET | ORAL | Status: AC
  Filled 2016-09-06: qty 1

## 2016-09-06 MED ORDER — ONDANSETRON HCL 8 MG PO TABS
8.0000 mg | ORAL_TABLET | Freq: Once | ORAL | Status: AC
  Administered 2016-09-06: 8 mg via ORAL

## 2016-09-06 MED ORDER — AZACITIDINE CHEMO SQ INJECTION
76.0000 mg/m2 | Freq: Once | INTRAMUSCULAR | Status: AC
  Administered 2016-09-06: 145 mg via SUBCUTANEOUS
  Filled 2016-09-06: qty 5.8

## 2016-09-06 NOTE — Patient Instructions (Signed)
[facility] Discharge Instructions for Patients Receiving Chemotherapy  Today you received the following chemotherapy agents Vidaza  To help prevent nausea and vomiting after your treatment, we encourage you to take your nausea medication as prescribed.    If you develop nausea and vomiting that is not controlled by your nausea medication, call the clinic.   BELOW ARE SYMPTOMS THAT SHOULD BE REPORTED IMMEDIATELY:  *FEVER GREATER THAN 100.5 F  *CHILLS WITH OR WITHOUT FEVER  NAUSEA AND VOMITING THAT IS NOT CONTROLLED WITH YOUR NAUSEA MEDICATION  *UNUSUAL SHORTNESS OF BREATH  *UNUSUAL BRUISING OR BLEEDING  TENDERNESS IN MOUTH AND THROAT WITH OR WITHOUT PRESENCE OF ULCERS  *URINARY PROBLEMS  *BOWEL PROBLEMS  UNUSUAL RASH Items with * indicate a potential emergency and should be followed up as soon as possible.  Feel free to call the clinic you have any questions or concerns. The clinic phone number is [phone].  Please show the CHEMO ALERT CARD at check-in to the Emergency Department and triage nurse.   

## 2016-09-12 ENCOUNTER — Other Ambulatory Visit (HOSPITAL_COMMUNITY): Payer: Self-pay

## 2016-09-12 ENCOUNTER — Emergency Department (HOSPITAL_COMMUNITY): Payer: Medicare Other

## 2016-09-12 ENCOUNTER — Other Ambulatory Visit: Payer: Self-pay

## 2016-09-12 ENCOUNTER — Telehealth: Payer: Self-pay | Admitting: *Deleted

## 2016-09-12 ENCOUNTER — Other Ambulatory Visit (HOSPITAL_BASED_OUTPATIENT_CLINIC_OR_DEPARTMENT_OTHER): Payer: Medicare Other

## 2016-09-12 ENCOUNTER — Inpatient Hospital Stay (HOSPITAL_COMMUNITY)
Admission: EM | Admit: 2016-09-12 | Discharge: 2016-09-14 | DRG: 189 | Disposition: A | Discharge status: Home / Self Care | Payer: Medicare Other | Attending: Internal Medicine | Admitting: Internal Medicine

## 2016-09-12 ENCOUNTER — Encounter (HOSPITAL_COMMUNITY): Payer: Self-pay | Admitting: Emergency Medicine

## 2016-09-12 DIAGNOSIS — I5031 Acute diastolic (congestive) heart failure: Secondary | ICD-10-CM

## 2016-09-12 DIAGNOSIS — I2510 Atherosclerotic heart disease of native coronary artery without angina pectoris: Secondary | ICD-10-CM

## 2016-09-12 DIAGNOSIS — I313 Pericardial effusion (noninflammatory): Secondary | ICD-10-CM | POA: Diagnosis not present

## 2016-09-12 DIAGNOSIS — Z808 Family history of malignant neoplasm of other organs or systems: Secondary | ICD-10-CM

## 2016-09-12 DIAGNOSIS — D4622 Refractory anemia with excess of blasts 2: Secondary | ICD-10-CM | POA: Diagnosis not present

## 2016-09-12 DIAGNOSIS — T451X5A Adverse effect of antineoplastic and immunosuppressive drugs, initial encounter: Secondary | ICD-10-CM | POA: Diagnosis not present

## 2016-09-12 DIAGNOSIS — Z85820 Personal history of malignant melanoma of skin: Secondary | ICD-10-CM

## 2016-09-12 DIAGNOSIS — J449 Chronic obstructive pulmonary disease, unspecified: Secondary | ICD-10-CM | POA: Diagnosis not present

## 2016-09-12 DIAGNOSIS — G4733 Obstructive sleep apnea (adult) (pediatric): Secondary | ICD-10-CM | POA: Diagnosis present

## 2016-09-12 DIAGNOSIS — Z8546 Personal history of malignant neoplasm of prostate: Secondary | ICD-10-CM

## 2016-09-12 DIAGNOSIS — R609 Edema, unspecified: Secondary | ICD-10-CM

## 2016-09-12 DIAGNOSIS — R600 Localized edema: Secondary | ICD-10-CM | POA: Diagnosis not present

## 2016-09-12 DIAGNOSIS — R5081 Fever presenting with conditions classified elsewhere: Secondary | ICD-10-CM | POA: Diagnosis not present

## 2016-09-12 DIAGNOSIS — R0600 Dyspnea, unspecified: Secondary | ICD-10-CM | POA: Diagnosis not present

## 2016-09-12 DIAGNOSIS — J810 Acute pulmonary edema: Secondary | ICD-10-CM | POA: Diagnosis not present

## 2016-09-12 DIAGNOSIS — Z79899 Other long term (current) drug therapy: Secondary | ICD-10-CM

## 2016-09-12 DIAGNOSIS — D709 Neutropenia, unspecified: Secondary | ICD-10-CM | POA: Diagnosis not present

## 2016-09-12 DIAGNOSIS — Z801 Family history of malignant neoplasm of trachea, bronchus and lung: Secondary | ICD-10-CM

## 2016-09-12 DIAGNOSIS — D72819 Decreased white blood cell count, unspecified: Secondary | ICD-10-CM | POA: Diagnosis present

## 2016-09-12 DIAGNOSIS — Z8042 Family history of malignant neoplasm of prostate: Secondary | ICD-10-CM

## 2016-09-12 DIAGNOSIS — R0602 Shortness of breath: Secondary | ICD-10-CM

## 2016-09-12 DIAGNOSIS — I10 Essential (primary) hypertension: Secondary | ICD-10-CM | POA: Diagnosis present

## 2016-09-12 DIAGNOSIS — J9601 Acute respiratory failure with hypoxia: Principal | ICD-10-CM | POA: Diagnosis present

## 2016-09-12 DIAGNOSIS — Z825 Family history of asthma and other chronic lower respiratory diseases: Secondary | ICD-10-CM

## 2016-09-12 DIAGNOSIS — D469 Myelodysplastic syndrome, unspecified: Secondary | ICD-10-CM | POA: Diagnosis present

## 2016-09-12 DIAGNOSIS — Z87891 Personal history of nicotine dependence: Secondary | ICD-10-CM

## 2016-09-12 DIAGNOSIS — I5032 Chronic diastolic (congestive) heart failure: Secondary | ICD-10-CM

## 2016-09-12 LAB — COMPREHENSIVE METABOLIC PANEL
ALT: 31 U/L (ref 0–55)
ANION GAP: 7 meq/L (ref 3–11)
AST: 38 U/L — ABNORMAL HIGH (ref 5–34)
Albumin: 3.2 g/dL — ABNORMAL LOW (ref 3.5–5.0)
Alkaline Phosphatase: 59 U/L (ref 40–150)
BUN: 20.3 mg/dL (ref 7.0–26.0)
CALCIUM: 8.4 mg/dL (ref 8.4–10.4)
CHLORIDE: 111 meq/L — AB (ref 98–109)
CO2: 24 meq/L (ref 22–29)
CREATININE: 1.1 mg/dL (ref 0.7–1.3)
EGFR: 66 mL/min/{1.73_m2} — ABNORMAL LOW (ref >90)
Glucose: 118 mg/dl (ref 70–140)
POTASSIUM: 3.9 meq/L (ref 3.5–5.1)
Sodium: 142 mEq/L (ref 136–145)
Total Bilirubin: 1.37 mg/dL — ABNORMAL HIGH (ref 0.20–1.20)
Total Protein: 6 g/dL — ABNORMAL LOW (ref 6.4–8.3)

## 2016-09-12 LAB — CBC & DIFF AND RETIC
BASO%: 0.8 % (ref 0.0–2.0)
Basophils Absolute: 0 10*3/uL (ref 0.0–0.1)
EOS%: 4.1 % (ref 0.0–7.0)
Eosinophils Absolute: 0.1 10*3/uL (ref 0.0–0.5)
HCT: 27.3 % — ABNORMAL LOW (ref 38.4–49.9)
HGB: 7.9 g/dL — ABNORMAL LOW (ref 13.0–17.1)
Immature Retic Fract: 34.4 % — ABNORMAL HIGH (ref 3.00–10.60)
LYMPH#: 1 10*3/uL (ref 0.9–3.3)
LYMPH%: 84.4 % — AB (ref 14.0–49.0)
MCH: 28.3 pg (ref 27.2–33.4)
MCHC: 28.9 g/dL — AB (ref 32.0–36.0)
MCV: 97.8 fL (ref 79.3–98.0)
MONO#: 0.1 10*3/uL (ref 0.1–0.9)
MONO%: 4.1 % (ref 0.0–14.0)
NEUT%: 6.6 % — ABNORMAL LOW (ref 39.0–75.0)
NEUTROS ABS: 0.1 10*3/uL — AB (ref 1.5–6.5)
PLATELETS: 124 10*3/uL — AB (ref 140–400)
RBC: 2.79 10*6/uL — AB (ref 4.20–5.82)
RDW: 20.7 % — ABNORMAL HIGH (ref 11.0–14.6)
RETIC %: 4.59 % — AB (ref 0.80–1.80)
RETIC CT ABS: 128.06 10*3/uL — AB (ref 34.80–93.90)
WBC: 1.2 10*3/uL — AB (ref 4.0–10.3)
nRBC: 4 % — ABNORMAL HIGH (ref 0–0)

## 2016-09-12 LAB — I-STAT TROPONIN, ED: TROPONIN I, POC: 0 ng/mL (ref 0.00–0.08)

## 2016-09-12 LAB — BRAIN NATRIURETIC PEPTIDE: B NATRIURETIC PEPTIDE 5: 87.7 pg/mL (ref 0.0–100.0)

## 2016-09-12 MED ORDER — IOPAMIDOL (ISOVUE-370) INJECTION 76%
100.0000 mL | Freq: Once | INTRAVENOUS | Status: AC | PRN
  Administered 2016-09-12: 100 mL via INTRAVENOUS

## 2016-09-12 NOTE — ED Notes (Signed)
Pt ambulated in the hallway 100% and back to the room 99%

## 2016-09-12 NOTE — ED Notes (Signed)
Patient transported to CT 

## 2016-09-12 NOTE — ED Provider Notes (Signed)
[facility] DEPT Provider Note   CSN: [id number] Arrival date & time: 09/12/16  1628     History   Chief Complaint Chief Complaint  Patient presents with  . Shortness of Breath    HPI Warren [name] is a 75 y.o. male.  HPI 75 year old male with a history of MDS currently on chemotherapy, who is neutropenic, with anemia requiring repeated transfusions presents the ED with worsening shortness of breath over the last 2 days. Patient also noted lower extremity edema. Denies any fevers, chills, chest pain, nausea, vomiting, abdominal pain, diarrhea, URI symptoms. Patient denies any prior history of heart failure.  He reports that he was seen in clinic earlier today and obtained labs which revealed hemoglobin of 7.9. Shortness of breath is similar to previous anemic episodes requiring transfusions however lower extremity edema is new per patient.  Past Medical History:  Diagnosis Date  . Cancer [facility])    Prostate, Melanoma- lt. Shoulder (no further problems since 2010  . CHF (congestive heart failure) ([place]) 09/13/2016  . Complication of anesthesia    versed gave the adverse reaction pt. ,became aggitated  . COPD (chronic obstructive pulmonary disease) (HCC)    Dr. Annamaria Boots follows  . Diffuse esophageal spasm   . Diverticulosis   . DJD (degenerative joint disease)   . Multiple trauma     horse accident 2003 L SHOULDER  . Oral herpes   . OSA (obstructive sleep apnea)    mild, rare cpap use  . Peripheral neuropathy ([place])   . Pulmonary nodule   . Spinal stenosis   . Systolic hypertension     Patient Active Problem List   Diagnosis Date Noted  . CHF (congestive heart failure) ([place]) 09/13/2016  . Acute respiratory failure with hypoxia ([place]) 09/13/2016  . MDS (myelodysplastic syndrome) ([place]) 08/25/2016  . Ankle cellulitis 08/25/2016  . Spl[place] 08/25/2016  . Mucositis due to antineoplastic therapy 08/03/2016  . Chills without fever 08/03/2016  . Skin rash 08/03/2016  . Iron  deficiency anemia 04/18/2016  . RAEB-2 (refractory anemia with excess blasts-2) ([place]) 03/24/2016  . Leucopenia 03/24/2016  . Neutropenia ([place]) 03/18/2016  . Anemia 03/18/2016  . Leukopenia 03/18/2016  . Pancytopenia ([place]) 03/18/2016  . History of blood transfusion 03/18/2016  . Thrombocytopenia ([place]) 03/18/2016  . Neutropenia, unspecified 03/18/2016  . Dyspnea on exertion 03/09/2016  . Dyslipidemia 11/11/2014  . CAD (coronary artery disease), native coronary artery 11/11/2014  . Coronary artery calcification 10/10/2013  . Benign essential HTN   . Atherosclerosis of aorta ([place]) 09/12/2013  . Acute bronchitis 10/06/2010  . OSA (obstructive sleep apnea) 10/07/2008  . PULMONARY NODULE 10/07/2008    Past Surgical History:  Procedure Laterality Date  . ,laceration to head in accident with hourse    . ACOUSTIC NEUROMA LEFT EAR  1999  . BALLOON DILATION N/A 11/25/2014   Procedure: BALLOON DILATION;  Surgeon: Garlan Fair, MD;  Location: [facility];  Service: Endoscopy;  Laterality: N/A;  . COLONOSCOPY WITH PROPOFOL N/A 10/01/2013   Procedure: COLONOSCOPY WITH PROPOFOL;  Surgeon: Garlan Fair, MD;  Location: [facility];  Service: Endoscopy;  Laterality: N/A;  . ESOPHAGOGASTRODUODENOSCOPY (EGD) WITH PROPOFOL N/A 11/25/2014   Procedure: ESOPHAGOGASTRODUODENOSCOPY (EGD) WITH PROPOFOL;  Surgeon: Garlan Fair, MD;  Location: [facility] ENDOSCOPY;  Service: Endoscopy;  Laterality: N/A;  . EYE SURGERY     cosmetic surgery"brow lift" 4'15  . MULTIPLE PROCEDURES FOR LEFT ARM     ELBOW,RIB FX PNEUMOTHORAX AFTER FALLING OFF MOUNTAIN  . PILONIDAL  CYST / SINUS EXCISION    . RADICAL PROSTATECTOMY         Home Medications    Prior to Admission medications   Medication Sig Start Date End Date Taking? Authorizing Provider  calcium carbonate (TUMS - DOSED IN MG ELEMENTAL CALCIUM) 500 MG chewable tablet Chew 1 tablet by mouth at bedtime.    Yes Historical Provider, MD  chlorhexidine  (PERIDEX) 0.12 % solution RINSE 10 ML (CC) IN MOUTH TWICE DAILY AS DIRECTED **SWISH  AND  SPIT,  DO  NOT  SWALLOW** 09/12/16  Yes Gautam Juleen China, MD  ciprofloxacin (CIPRO) 500 MG tablet Take 1 tablet (500 mg total) by mouth 2 (two) times daily. 07/25/16  Yes Brunetta Genera, MD  magic mouthwash SOLN Take 5 mLs by mouth 4 (four) times daily as needed for mouth pain. 08/03/16  Yes Heath Lark, MD  nitroGLYCERIN (NITROSTAT) 0.4 MG SL tablet Place 1 tablet (0.4 mg total) under the tongue every 5 (five) minutes as needed for chest pain. 11/11/14  Yes Sueanne Margarita, MD  omeprazole (PRILOSEC) 40 MG capsule Take 40 mg by mouth daily before lunch. 1/2 hour before.   Yes Historical Provider, MD  ondansetron (ZOFRAN) 4 MG tablet Take 4 mg by mouth every 8 (eight) hours as needed for nausea or vomiting.  05/07/16  Yes Historical Provider, MD  posaconazole (NOXAFIL) 100 MG TBEC delayed-release tablet Take 3 tablets (300 mg total) by mouth daily. Patient taking differently: Take 300 mg by mouth at bedtime.  08/22/16  Yes Brunetta Genera, MD  senna-docusate (SENNA S) 8.6-50 MG tablet Take 2 tablets by mouth 2 (two) times daily. Patient taking differently: Take 2 tablets by mouth 2 (two) times daily as needed for mild constipation or moderate constipation.  04/06/16  Yes Brunetta Genera, MD  valACYclovir (VALTREX) 500 MG tablet Take 1 tablet (500 mg total) by mouth 2 (two) times daily. At night to suppress herpes gingivitis 08/22/16  Yes Brunetta Genera, MD  valsartan (DIOVAN) 80 MG tablet Take 40 mg by mouth at bedtime.    Yes Historical Provider, MD  Isavuconazonium Sulfate 186 MG CAPS Take 186 mg by mouth daily. For antifungal prophylaxis (patient with MDS with profound persistent neutropenia) Patient not taking: Reported on 09/12/2016 09/05/16   Brunetta Genera, MD    Family History Family History  Problem Relation Age of Onset  . Emphysema Father   . Asthma Father   . Lung cancer Father     . Breast cancer Sister   . Breast cancer Sister   . Skin cancer Brother   . Prostate cancer Brother     Social History Social History  Substance Use Topics  . Smoking status: Former Smoker    Packs/day: 1.00    Years: 40.00    Quit date: 11/29/1995  . Smokeless tobacco: Never Used  . Alcohol use Yes     Comment: 1 drink per day     Allergies   Fluconazole and Midazolam   Review of Systems Review of Systems Ten systems are reviewed and are negative for acute change except as noted in the HPI   Physical Exam Updated Vital Signs BP 144/76   Pulse 61   Temp 98.3 F (36.8 C) (Oral)   Resp 22   SpO2 100%   Physical Exam  Constitutional: He is oriented to person, place, and time. He appears well-developed and well-nourished. No distress.  HENT:  Head: Normocephalic and atraumatic.  Nose: Nose  normal.  Eyes: Conjunctivae and EOM are normal. Pupils are equal, round, and reactive to light. Right eye exhibits no discharge. Left eye exhibits no discharge. No scleral icterus.  Neck: Normal range of motion. Neck supple.  Cardiovascular: Normal rate and regular rhythm.  Exam reveals no gallop and no friction rub.   No murmur heard. Pulmonary/Chest: Effort normal and breath sounds normal. No stridor. No respiratory distress. He has no rales.  Abdominal: Soft. He exhibits no distension. There is no tenderness.  Musculoskeletal: He exhibits no edema or tenderness.  1+ BLE edema to above ankles  Neurological: He is alert and oriented to person, place, and time.  Skin: Skin is warm and dry. No rash noted. He is not diaphoretic. No erythema.  Psychiatric: He has a normal mood and affect.  Vitals reviewed.    ED Treatments / Results  Labs (all labs ordered are listed, but only abnormal results are displayed) Labs Reviewed  BRAIN NATRIURETIC PEPTIDE  CBC WITH DIFFERENTIAL/PLATELET  TROPONIN I  TROPONIN I  TROPONIN I  CBC  CREATININE, SERUM  I-STAT TROPOININ, ED     EKG  EKG Interpretation None       Radiology Dg Chest 2 View  Result Date: 09/12/2016 CLINICAL DATA:  Neutropenic patient with dyspnea and ankle swelling. Patient on chemotherapy. History of prostate cancer. EXAM: CHEST  2 VIEW COMPARISON:  07/17/2016 chest radiograph obtained 02/28/2013 chest CT. FINDINGS: The heart size and mediastinal contours are within normal limits. Both lungs are clear. Surgical screw is seen over the left humeral head. Stable T6 through T8 mild compression deformities since 2014 comparison accounting for mild mid thoracic kyphosis. No apparent blastic disease. IMPRESSION: No active cardiopulmonary disease. Electronically Signed   By: Ashley Royalty M.D.   On: 09/12/2016 18:02   Ct Angio Chest Pe W And/or Wo Contrast  Result Date: 09/13/2016 CLINICAL DATA:  Initial evaluation for acute shortness of breath. History of prostate cancer. EXAM: CT ANGIOGRAPHY CHEST WITH CONTRAST TECHNIQUE: Multidetector CT imaging of the chest was performed using the standard protocol during bolus administration of intravenous contrast. Multiplanar CT image reconstructions and MIPs were obtained to evaluate the vascular anatomy. CONTRAST:  100 cc of Isovue 370. COMPARISON:  Prior radiograph from earlier the same day. FINDINGS: Cardiovascular: Intrathoracic aorta of normal caliber without evidence for aneurysm or other acute abnormality. Visualized great vessels within normal limits. Mild cardiomegaly present. Scattered coronary artery calcifications noted. Small pericardial fusion. Pulmonary arterial tree adequately opacified for evaluation. Main pulmonary artery measures at the upper limits of normal for size at 3 cm in diameter. No filling defect to suggest acute pulmonary embolism. Re-formatted imaging confirms these findings. Mediastinum/Nodes: Thyroid normal. No pathologically enlarged mediastinal, hilar, or axillary lymph nodes identified. Esophagus within normal limits. Lungs/Pleura:  Small layering bilateral pleural effusions present. Mild scattered interlobular septal thickening within the lungs, suggesting mild pulmonary interstitial edema. Underlying emphysema. No focal infiltrates. No pneumothorax. No worrisome pulmonary nodule or mass. Few scattered granulomas noted within the left lung base. Upper Abdomen: 1.9 cm hypodense lesion within the liver compatible with a cyst. Additional sub cm hypodensity within the right hepatic lobe noted, too small the characterize, but may reflect a small cyst as well. Probable small cyst noted within the right kidney. Remainder the visualized upper abdomen otherwise unremarkable. Musculoskeletal: No acute osseous abnormality. No worrisome lytic or blastic osseous lesion. Scattered mild degenerative changes within the mid thoracic spine. Review of the MIP images confirms the above findings. IMPRESSION: 1. No  CT evidence for acute pulmonary embolism. 2. Mild diffuse interlobular septal thickening, suggestive of mild pulmonary interstitial edema. 3. Small layering bilateral pleural effusions. 4. Mild cardiomegaly with associated small pericardial fusion. 5. Emphysema. Electronically Signed   By: Jeannine Boga M.D.   On: 09/13/2016 00:02    Procedures Procedures (including critical care time)  Medications Ordered in ED Medications  furosemide (LASIX) injection 40 mg (not administered)     Initial Impression / Assessment and Plan / ED Course  I have reviewed the triage vital signs and the nursing notes.  Pertinent labs & imaging results that were available during my care of the patient were reviewed by me and considered in my medical decision making (see chart for details).  Clinical Course    Workup ruled out pneumonia, pulmonary embolism. Presentation was suggestive of new onset heart failure with lower extremity edema and pulmonary edema. Patient provided with IV Lasix and admitted to the hospitalist for continued workup and  management.  Final Clinical Impressions(s) / ED Diagnoses   Final diagnoses:  Shortness of breath  Peripheral edema  Acute pulmonary edema ([place])    Disposition: Admit  Condition: Stable    Fatima Blank, MD 09/13/16 [phone]

## 2016-09-12 NOTE — Telephone Encounter (Signed)
"  I was in today for lab.  Now that I'm home I noticed both ankles swollen with a little more prominence on the left.  Short of breath with shallow breathing.  I do have low blood counts.  My pulse = 56 and normally is in the seventies.  B/P is elevated.  I texted my son who is a doctor.  My son says I ned an EKG and a CXR."  This nurse advised he go to the ED.  Denies chest pain and these are new symptoms.  Notified Dr. Irene Limbo, verbal order received and read back for patient to go to the ED.  States he will go.

## 2016-09-12 NOTE — ED Triage Notes (Signed)
Pt states that he has a hx of prostate CA and was severely neutropenic today with SOB at rest and ankle swelling. Last chemo on 10/10. Just had labs today. Alert and oriented.

## 2016-09-13 ENCOUNTER — Other Ambulatory Visit: Payer: Self-pay | Admitting: *Deleted

## 2016-09-13 ENCOUNTER — Inpatient Hospital Stay (HOSPITAL_COMMUNITY): Payer: Medicare Other

## 2016-09-13 ENCOUNTER — Telehealth: Payer: Self-pay | Admitting: Cardiology

## 2016-09-13 ENCOUNTER — Encounter (HOSPITAL_COMMUNITY): Payer: Self-pay | Admitting: Emergency Medicine

## 2016-09-13 DIAGNOSIS — D649 Anemia, unspecified: Secondary | ICD-10-CM | POA: Diagnosis not present

## 2016-09-13 DIAGNOSIS — I509 Heart failure, unspecified: Secondary | ICD-10-CM

## 2016-09-13 DIAGNOSIS — Z801 Family history of malignant neoplasm of trachea, bronchus and lung: Secondary | ICD-10-CM | POA: Diagnosis not present

## 2016-09-13 DIAGNOSIS — J9601 Acute respiratory failure with hypoxia: Secondary | ICD-10-CM | POA: Diagnosis not present

## 2016-09-13 DIAGNOSIS — I5032 Chronic diastolic (congestive) heart failure: Secondary | ICD-10-CM

## 2016-09-13 DIAGNOSIS — R7881 Bacteremia: Secondary | ICD-10-CM | POA: Diagnosis not present

## 2016-09-13 DIAGNOSIS — D469 Myelodysplastic syndrome, unspecified: Secondary | ICD-10-CM

## 2016-09-13 DIAGNOSIS — G4733 Obstructive sleep apnea (adult) (pediatric): Secondary | ICD-10-CM | POA: Diagnosis present

## 2016-09-13 DIAGNOSIS — R0602 Shortness of breath: Secondary | ICD-10-CM | POA: Diagnosis not present

## 2016-09-13 DIAGNOSIS — J449 Chronic obstructive pulmonary disease, unspecified: Secondary | ICD-10-CM | POA: Diagnosis present

## 2016-09-13 DIAGNOSIS — R350 Frequency of micturition: Secondary | ICD-10-CM | POA: Diagnosis not present

## 2016-09-13 DIAGNOSIS — R8290 Unspecified abnormal findings in urine: Secondary | ICD-10-CM | POA: Diagnosis not present

## 2016-09-13 DIAGNOSIS — Z85820 Personal history of malignant melanoma of skin: Secondary | ICD-10-CM | POA: Diagnosis not present

## 2016-09-13 DIAGNOSIS — R071 Chest pain on breathing: Secondary | ICD-10-CM | POA: Diagnosis not present

## 2016-09-13 DIAGNOSIS — I10 Essential (primary) hypertension: Secondary | ICD-10-CM | POA: Diagnosis not present

## 2016-09-13 DIAGNOSIS — T451X5A Adverse effect of antineoplastic and immunosuppressive drugs, initial encounter: Secondary | ICD-10-CM | POA: Diagnosis present

## 2016-09-13 DIAGNOSIS — A4101 Sepsis due to Methicillin susceptible Staphylococcus aureus: Secondary | ICD-10-CM | POA: Diagnosis not present

## 2016-09-13 DIAGNOSIS — D696 Thrombocytopenia, unspecified: Secondary | ICD-10-CM | POA: Diagnosis not present

## 2016-09-13 DIAGNOSIS — D4622 Refractory anemia with excess of blasts 2: Secondary | ICD-10-CM | POA: Diagnosis present

## 2016-09-13 DIAGNOSIS — R5081 Fever presenting with conditions classified elsewhere: Secondary | ICD-10-CM | POA: Diagnosis not present

## 2016-09-13 DIAGNOSIS — Z9889 Other specified postprocedural states: Secondary | ICD-10-CM | POA: Diagnosis not present

## 2016-09-13 DIAGNOSIS — I2510 Atherosclerotic heart disease of native coronary artery without angina pectoris: Secondary | ICD-10-CM | POA: Diagnosis present

## 2016-09-13 DIAGNOSIS — I313 Pericardial effusion (noninflammatory): Secondary | ICD-10-CM | POA: Diagnosis present

## 2016-09-13 DIAGNOSIS — Z79899 Other long term (current) drug therapy: Secondary | ICD-10-CM | POA: Diagnosis not present

## 2016-09-13 DIAGNOSIS — Z87891 Personal history of nicotine dependence: Secondary | ICD-10-CM | POA: Diagnosis not present

## 2016-09-13 DIAGNOSIS — Z808 Family history of malignant neoplasm of other organs or systems: Secondary | ICD-10-CM | POA: Diagnosis not present

## 2016-09-13 DIAGNOSIS — Z825 Family history of asthma and other chronic lower respiratory diseases: Secondary | ICD-10-CM | POA: Diagnosis not present

## 2016-09-13 DIAGNOSIS — Z8546 Personal history of malignant neoplasm of prostate: Secondary | ICD-10-CM | POA: Diagnosis not present

## 2016-09-13 DIAGNOSIS — Z8042 Family history of malignant neoplasm of prostate: Secondary | ICD-10-CM | POA: Diagnosis not present

## 2016-09-13 DIAGNOSIS — R609 Edema, unspecified: Secondary | ICD-10-CM | POA: Diagnosis not present

## 2016-09-13 DIAGNOSIS — J90 Pleural effusion, not elsewhere classified: Secondary | ICD-10-CM | POA: Diagnosis not present

## 2016-09-13 DIAGNOSIS — R911 Solitary pulmonary nodule: Secondary | ICD-10-CM | POA: Diagnosis not present

## 2016-09-13 DIAGNOSIS — D709 Neutropenia, unspecified: Secondary | ICD-10-CM | POA: Diagnosis present

## 2016-09-13 DIAGNOSIS — R05 Cough: Secondary | ICD-10-CM | POA: Diagnosis not present

## 2016-09-13 DIAGNOSIS — I5031 Acute diastolic (congestive) heart failure: Secondary | ICD-10-CM | POA: Diagnosis not present

## 2016-09-13 DIAGNOSIS — R6520 Severe sepsis without septic shock: Secondary | ICD-10-CM | POA: Diagnosis not present

## 2016-09-13 HISTORY — DX: Chronic diastolic (congestive) heart failure: I50.32

## 2016-09-13 LAB — CBC WITH DIFFERENTIAL/PLATELET
BASOS PCT: 0 %
Basophils Absolute: 0 10*3/uL (ref 0.0–0.1)
Eosinophils Absolute: 0.1 10*3/uL (ref 0.0–0.7)
Eosinophils Relative: 3 %
HEMATOCRIT: 27 % — AB (ref 39.0–52.0)
HEMOGLOBIN: 7.8 g/dL — AB (ref 13.0–17.0)
Lymphocytes Relative: 88 %
Lymphs Abs: 1.3 10*3/uL (ref 0.7–4.0)
MCH: 28.5 pg (ref 26.0–34.0)
MCHC: 28.9 g/dL — AB (ref 30.0–36.0)
MCV: 98.5 fL (ref 78.0–100.0)
MONO ABS: 0 10*3/uL — AB (ref 0.1–1.0)
MONOS PCT: 1 %
NEUTROS ABS: 0.1 10*3/uL — AB (ref 1.7–7.7)
Neutrophils Relative %: 8 %
Platelets: 134 10*3/uL — ABNORMAL LOW (ref 150–400)
RBC: 2.74 MIL/uL — ABNORMAL LOW (ref 4.22–5.81)
RDW: 20.5 % — ABNORMAL HIGH (ref 11.5–15.5)
WBC: 1.5 10*3/uL — ABNORMAL LOW (ref 4.0–10.5)

## 2016-09-13 LAB — TROPONIN I
Troponin I: <0.03 ng/mL (ref <0.03)
Troponin I: <0.03 ng/mL (ref <0.03)
Troponin I: <0.03 ng/mL (ref <0.03)

## 2016-09-13 LAB — CREATININE, SERUM: Creatinine, Ser: 1.05 mg/dL (ref 0.61–1.24)

## 2016-09-13 LAB — ECHOCARDIOGRAM COMPLETE
HEIGHTINCHES: 67 in
Weight: 2659.63 oz

## 2016-09-13 MED ORDER — ONDANSETRON HCL 4 MG PO TABS: 4.0000 mg | ORAL_TABLET | Freq: Four times a day (QID) | ORAL | Status: DC | PRN

## 2016-09-13 MED ORDER — ACETAMINOPHEN 325 MG PO TABS: 650.0000 mg | ORAL_TABLET | Freq: Four times a day (QID) | ORAL | Status: DC | PRN

## 2016-09-13 MED ORDER — CIPROFLOXACIN HCL 500 MG PO TABS
500.0000 mg | ORAL_TABLET | Freq: Two times a day (BID) | ORAL | Status: DC
  Administered 2016-09-13 – 2016-09-14 (×4): 500 mg via ORAL
  Filled 2016-09-13 (×4): qty 1

## 2016-09-13 MED ORDER — ACETAMINOPHEN 650 MG RE SUPP: 650.0000 mg | Freq: Four times a day (QID) | RECTAL | Status: DC | PRN

## 2016-09-13 MED ORDER — VALACYCLOVIR HCL 500 MG PO TABS
500.0000 mg | ORAL_TABLET | Freq: Two times a day (BID) | ORAL | Status: DC
  Administered 2016-09-13 – 2016-09-14 (×4): 500 mg via ORAL
  Filled 2016-09-13 (×4): qty 1

## 2016-09-13 MED ORDER — ENOXAPARIN SODIUM 40 MG/0.4ML ~~LOC~~ SOLN
40.0000 mg | SUBCUTANEOUS | Status: DC
  Administered 2016-09-13 – 2016-09-14 (×2): 40 mg via SUBCUTANEOUS
  Filled 2016-09-13 (×2): qty 0.4

## 2016-09-13 MED ORDER — SODIUM CHLORIDE 0.9% FLUSH
3.0000 mL | INTRAVENOUS | Status: DC | PRN
Start: 2016-09-13 — End: 2016-09-14

## 2016-09-13 MED ORDER — CALCIUM CARBONATE ANTACID 500 MG PO CHEW
1.0000 | CHEWABLE_TABLET | Freq: Every day | ORAL | Status: DC
  Administered 2016-09-13: 200 mg via ORAL
  Filled 2016-09-13: qty 1

## 2016-09-13 MED ORDER — FUROSEMIDE 10 MG/ML IJ SOLN
40.0000 mg | Freq: Once | INTRAMUSCULAR | Status: AC
  Administered 2016-09-13: 40 mg via INTRAVENOUS
  Filled 2016-09-13: qty 4

## 2016-09-13 MED ORDER — MAGIC MOUTHWASH
5.0000 mL | Freq: Four times a day (QID) | ORAL | Status: DC | PRN
  Filled 2016-09-13: qty 5

## 2016-09-13 MED ORDER — SODIUM CHLORIDE 0.9% FLUSH
3.0000 mL | Freq: Two times a day (BID) | INTRAVENOUS | Status: DC
  Administered 2016-09-13 – 2016-09-14 (×4): 3 mL via INTRAVENOUS

## 2016-09-13 MED ORDER — ONDANSETRON HCL 4 MG/2ML IJ SOLN: 4.0000 mg | Freq: Four times a day (QID) | INTRAMUSCULAR | Status: DC | PRN

## 2016-09-13 MED ORDER — POSACONAZOLE 100 MG PO TBEC
300.0000 mg | DELAYED_RELEASE_TABLET | Freq: Every day | ORAL | Status: DC
  Administered 2016-09-13 (×2): 300 mg via ORAL
  Filled 2016-09-13 (×2): qty 3

## 2016-09-13 MED ORDER — ONDANSETRON HCL 4 MG PO TABS: 4.0000 mg | ORAL_TABLET | Freq: Three times a day (TID) | ORAL | Status: DC | PRN

## 2016-09-13 MED ORDER — SENNOSIDES-DOCUSATE SODIUM 8.6-50 MG PO TABS: 2.0000 | ORAL_TABLET | Freq: Two times a day (BID) | ORAL | Status: DC | PRN

## 2016-09-13 MED ORDER — SODIUM CHLORIDE 0.9 % IV SOLN: 250.0000 mL | INTRAVENOUS | Status: DC | PRN

## 2016-09-13 MED ORDER — NITROGLYCERIN 0.4 MG SL SUBL: 0.4000 mg | SUBLINGUAL_TABLET | SUBLINGUAL | Status: DC | PRN

## 2016-09-13 MED ORDER — PANTOPRAZOLE SODIUM 40 MG PO TBEC
40.0000 mg | DELAYED_RELEASE_TABLET | Freq: Every day | ORAL | Status: DC
  Administered 2016-09-13 – 2016-09-14 (×2): 40 mg via ORAL
  Filled 2016-09-13 (×2): qty 1

## 2016-09-13 MED ORDER — IRBESARTAN 75 MG PO TABS
37.5000 mg | ORAL_TABLET | Freq: Every day | ORAL | Status: DC
  Administered 2016-09-13 – 2016-09-14 (×2): 37.5 mg via ORAL
  Filled 2016-09-13 (×2): qty 1

## 2016-09-13 MED ORDER — CHLORHEXIDINE GLUCONATE 0.12 % MT SOLN
10.0000 mL | Freq: Two times a day (BID) | OROMUCOSAL | Status: DC
  Administered 2016-09-13 – 2016-09-14 (×4): 10 mL via OROMUCOSAL
  Filled 2016-09-13 (×4): qty 15

## 2016-09-13 NOTE — Telephone Encounter (Signed)
Patient states that since he last saw Dr. Radford Pax, he was diagnosed with bone marrow cancer. He has been receiving chemo treatments and has a couple more cycles. He has a biopsy scheduled Monday.  He went to the ED yesterday with swollen ankles, low heart rate (for him), and SOB. He is still currently at [facility].   He would like Dr. Radford Pax to review his hospital course and give him any new recommendations. He has his yearly appointment with Dr. Radford Pax 11/07/16.
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
## 2016-09-13 NOTE — Telephone Encounter (Signed)
I have reviewed the chart.  Echo is normal.  Reassure him that if hospitalist feel that cardiology help is needed they will consult [place]

## 2016-09-13 NOTE — Progress Notes (Signed)
Patient seen and evaluated earlier this AM by my associate. Please refer to H and P for details.   Pt received lasix. Will continue to monitor  Gen: pt in nad, alert and awake CV: no cyanosis Pulm: equal chest rise.  Will reassess next am  Angelena Sand, Linward Foster

## 2016-09-13 NOTE — Telephone Encounter (Signed)
New Message:    Pt want you to call,he is in the hospital. He says it is something that he wants to make sure that Dr Radford Pax see right away.

## 2016-09-13 NOTE — Consult Note (Addendum)
   [facility] CM Inpatient Consult   09/13/2016  Warren [name] 1941/03/01 [id number]    [facility] Care Management referral received. Went to bedside to discuss and offer Warren [name] Management services. Warren [name] was agreeable and written consent signed. Explained that he will receive post hospital transition of care calls and will be evaluated for monthly home visits. Confirmed Primary Care MD as [place] but he primarily goes to Dr. Irene Limbo (Oncologist).  Confirmed best contact number as [phone]. Explained that [facility] Management will not interfere or replace services provided by home health if he should have it.  Left [facility] Care Management packet and contact information at bedside. Will make inpatient RNCM aware that patient will be followed by [facility] Management post hospital discharge.   Warren [name] reports he lives with his wife. He is anxious to go home as he states "I have been severely neutropenic for quite sometime. I have more contact with people just being in the hospital then I have in a while". He asks that Probation officer follow up on who will reading his echo. Warren [name] states he is certain his symptoms are a side effect from his cancer treatment. Made his nurse aware of his concerns. She states she was already aware and has spoken with Warren [name] about it and will speak with him again.   Will request for patient to be assigned to [place] for post transition of care. He has a history of leukopenia, neutropenia, anemia, myelodysplastic syndrome, COPD, CHF, HTN. Discussed with Warren [name] that [place] [facility] Management will follow up will consist of CHF disease and symptom management. He reports he weighs daily and writes it down.   Marthenia Rolling, MSN-Ed, RN,BSN [facility] Liaison [phone]

## 2016-09-13 NOTE — Progress Notes (Signed)
  Echocardiogram 2D Echocardiogram has been performed.  Tresa Res 09/13/2016, 1:08 PM

## 2016-09-14 ENCOUNTER — Emergency Department (HOSPITAL_COMMUNITY): Payer: Medicare Other

## 2016-09-14 ENCOUNTER — Other Ambulatory Visit: Payer: Self-pay | Admitting: *Deleted

## 2016-09-14 ENCOUNTER — Other Ambulatory Visit: Payer: Self-pay

## 2016-09-14 ENCOUNTER — Encounter (HOSPITAL_COMMUNITY): Payer: Self-pay | Admitting: Emergency Medicine

## 2016-09-14 ENCOUNTER — Inpatient Hospital Stay (HOSPITAL_COMMUNITY)
Admission: EM | Admit: 2016-09-14 | Discharge: 2016-09-26 | DRG: 871 | Disposition: A | Discharge status: Home Health | Payer: Medicare Other | Attending: Family Medicine | Admitting: Family Medicine

## 2016-09-14 DIAGNOSIS — L0291 Cutaneous abscess, unspecified: Secondary | ICD-10-CM

## 2016-09-14 DIAGNOSIS — D649 Anemia, unspecified: Secondary | ICD-10-CM

## 2016-09-14 DIAGNOSIS — D4622 Refractory anemia with excess of blasts 2: Secondary | ICD-10-CM

## 2016-09-14 DIAGNOSIS — D709 Neutropenia, unspecified: Secondary | ICD-10-CM

## 2016-09-14 DIAGNOSIS — R911 Solitary pulmonary nodule: Secondary | ICD-10-CM

## 2016-09-14 DIAGNOSIS — R609 Edema, unspecified: Secondary | ICD-10-CM

## 2016-09-14 DIAGNOSIS — R7881 Bacteremia: Secondary | ICD-10-CM

## 2016-09-14 DIAGNOSIS — R059 Cough, unspecified: Secondary | ICD-10-CM

## 2016-09-14 DIAGNOSIS — R5081 Fever presenting with conditions classified elsewhere: Secondary | ICD-10-CM

## 2016-09-14 DIAGNOSIS — Z9889 Other specified postprocedural states: Secondary | ICD-10-CM

## 2016-09-14 DIAGNOSIS — R358 Other polyuria: Secondary | ICD-10-CM

## 2016-09-14 DIAGNOSIS — R3589 Other polyuria: Secondary | ICD-10-CM

## 2016-09-14 DIAGNOSIS — M25572 Pain in left ankle and joints of left foot: Secondary | ICD-10-CM

## 2016-09-14 DIAGNOSIS — J90 Pleural effusion, not elsewhere classified: Secondary | ICD-10-CM

## 2016-09-14 DIAGNOSIS — G8929 Other chronic pain: Secondary | ICD-10-CM

## 2016-09-14 DIAGNOSIS — D469 Myelodysplastic syndrome, unspecified: Secondary | ICD-10-CM | POA: Diagnosis present

## 2016-09-14 DIAGNOSIS — D696 Thrombocytopenia, unspecified: Secondary | ICD-10-CM | POA: Diagnosis present

## 2016-09-14 DIAGNOSIS — A4101 Sepsis due to Methicillin susceptible Staphylococcus aureus: Secondary | ICD-10-CM

## 2016-09-14 DIAGNOSIS — I5031 Acute diastolic (congestive) heart failure: Secondary | ICD-10-CM

## 2016-09-14 DIAGNOSIS — R05 Cough: Secondary | ICD-10-CM

## 2016-09-14 DIAGNOSIS — R079 Chest pain, unspecified: Secondary | ICD-10-CM

## 2016-09-14 LAB — TSH: TSH: 0.848 u[IU]/mL (ref 0.350–4.500)

## 2016-09-14 LAB — I-STAT CG4 LACTIC ACID, ED: LACTIC ACID, VENOUS: 1.52 mmol/L (ref 0.5–1.9)

## 2016-09-14 LAB — BASIC METABOLIC PANEL
Anion gap: 5 (ref 5–15)
BUN: 16 mg/dL (ref 6–20)
CALCIUM: 8.2 mg/dL — AB (ref 8.9–10.3)
CO2: 28 mmol/L (ref 22–32)
CREATININE: 1.1 mg/dL (ref 0.61–1.24)
Chloride: 109 mmol/L (ref 101–111)
GFR calc non Af Amer: >60 mL/min (ref >60)
Glucose, Bld: 97 mg/dL (ref 65–99)
Potassium: 3.5 mmol/L (ref 3.5–5.1)
SODIUM: 142 mmol/L (ref 135–145)

## 2016-09-14 MED ORDER — SENNOSIDES-DOCUSATE SODIUM 8.6-50 MG PO TABS: 2.0000 | ORAL_TABLET | Freq: Two times a day (BID) | ORAL | Status: DC | PRN

## 2016-09-14 MED ORDER — FUROSEMIDE 20 MG PO TABS: 20.0000 mg | ORAL_TABLET | Freq: Every day | ORAL | 0 refills | Status: DC

## 2016-09-14 MED ORDER — POTASSIUM CHLORIDE ER 10 MEQ PO TBCR
20.0000 meq | EXTENDED_RELEASE_TABLET | Freq: Every day | ORAL | 0 refills | Status: DC
Start: 2016-09-14 — End: 2016-10-25

## 2016-09-14 MED ORDER — DEXTROSE 5 % IV SOLN
2.0000 g | INTRAVENOUS | Status: AC
  Administered 2016-09-15: 2 g via INTRAVENOUS
  Filled 2016-09-14: qty 2

## 2016-09-14 NOTE — ED Notes (Signed)
MD at bedside. 

## 2016-09-14 NOTE — ED Notes (Signed)
EKG given to EDP,Poll[place],MD., for review. 

## 2016-09-14 NOTE — ED Triage Notes (Signed)
Pt states in May he was diagnosed with high risk MDS and started chemo  Pt states he finished his chemo on the 10th of October  Pt states on Monday his ankles started to swell, had shortness of breath, and low pulse  Pt states he was seen here and had multiple tests performed and his CT scan showed fluid in his lungs  Pt states he has felt good all day today but tonight he started having chills, feeling cold, and shaking  Pt states he took his temp and it was 99.4  Pt sees Dr Irene Limbo at the cancer center  Pt states his neutrophil count is 0.1  Pt does not have a portacath

## 2016-09-14 NOTE — Care Management CC44 (Signed)
Condition Code 44 Documentation Completed  Patient Details  Name: Warren [name] MRN: [id number] Date of Birth: 10-23-1941   Condition Code 44 given:  Yes Patient signature on Condition Code 44 notice:    Documentation of 2 MD's agreement:    Code 44 added to claim:       Purcell Mouton, RN 09/14/2016, 12:52 PM

## 2016-09-14 NOTE — ED Provider Notes (Signed)
[facility] DEPT Provider Note   CSN: [id number] Arrival date & time: 09/14/16  2240  By signing my name below, I, Warren [name], attest that this documentation has been prepared under the direction and in the presence of physician practitioner, Orpah Greek, MD. Electronically Signed: Dora [name], Scribe. 09/14/2016. 11:29 PM.  History   Chief Complaint Chief Complaint  Patient presents with  . Fever    The history is provided by the patient. No language interpreter was used.     HPI Comments: Warren [name] is a 75 y.o. male with PMHx significant for cancer, MDS, anemia, leukopenia, and neutropenia who presents to the Emergency Department complaining of sudden onset, constant, tremors beginning about an hour ago. He notes associated fever and chills. Pt reports he measured a temperature of 99.4 shortly PTA. Pt states he felt normal all day today until his tremors presented. He states he finished his sixth cycle of chemotherapy for MDS on 09/06/16. He states his neutrophil count was measured at 0.1 recently. He notes some recent allergy-related symptoms, such as rhinorrhea and eye watering. He notes he was admitted to the hospital on 09/12/16 for shortness of breath. He denies cough, SOB, abdominal pain, dysuria, or any other associated symptoms.  Past Medical History:  Diagnosis Date  . Cancer [facility])    Prostate, Melanoma- lt. Shoulder (no further problems since 2010  . CHF (congestive heart failure) ([place]) 09/13/2016  . Complication of anesthesia    versed gave the adverse reaction pt. ,became aggitated  . COPD (chronic obstructive pulmonary disease) (HCC)    Dr. Annamaria Boots follows  . Diffuse esophageal spasm   . Diverticulosis   . DJD (degenerative joint disease)   . Multiple trauma     horse accident 2003 L SHOULDER  . Oral herpes   . OSA (obstructive sleep apnea)    mild, rare cpap use  . Peripheral neuropathy ([place])   . Pulmonary nodule   . Spinal stenosis   .  Systolic hypertension     Patient Active Problem List   Diagnosis Date Noted  . Neutropenic fever ([place]) 09/15/2016  . CHF (congestive heart failure) ([place]) 09/13/2016  . Acute respiratory failure with hypoxia ([place]) 09/13/2016  . MDS (myelodysplastic syndrome) ([place]) 08/25/2016  . Ankle cellulitis 08/25/2016  . Splinter 08/25/2016  . Mucositis due to antineoplastic therapy 08/03/2016  . Chills without fever 08/03/2016  . Skin rash 08/03/2016  . Iron deficiency anemia 04/18/2016  . RAEB-2 (refractory anemia with excess blasts-2) ([place]) 03/24/2016  . Leucopenia 03/24/2016  . Neutropenia ([place]) 03/18/2016  . Anemia 03/18/2016  . Leukopenia 03/18/2016  . Pancytopenia ([place]) 03/18/2016  . History of blood transfusion 03/18/2016  . Thrombocytopenia ([place]) 03/18/2016  . Neutropenia, unspecified 03/18/2016  . Dyspnea on exertion 03/09/2016  . Dyslipidemia 11/11/2014  . CAD (coronary artery disease), native coronary artery 11/11/2014  . Coronary artery calcification 10/10/2013  . Benign essential HTN   . Atherosclerosis of aorta ([place]) 09/12/2013  . Acute bronchitis 10/06/2010  . OSA (obstructive sleep apnea) 10/07/2008  . PULMONARY NODULE 10/07/2008    Past Surgical History:  Procedure Laterality Date  . ,laceration to head in accident with hourse    . ACOUSTIC NEUROMA LEFT EAR  1999  . BALLOON DILATION N/A 11/25/2014   Procedure: BALLOON DILATION;  Surgeon: Garlan Fair, MD;  Location: [facility];  Service: Endoscopy;  Laterality: N/A;  . COLONOSCOPY WITH PROPOFOL N/A 10/01/2013   Procedure: COLONOSCOPY WITH PROPOFOL;  Surgeon: Garlan Fair, MD;  Location: [facility];  Service: Endoscopy;  Laterality: N/A;  . ESOPHAGOGASTRODUODENOSCOPY (EGD) WITH PROPOFOL N/A 11/25/2014   Procedure: ESOPHAGOGASTRODUODENOSCOPY (EGD) WITH PROPOFOL;  Surgeon: Garlan Fair, MD;  Location: [facility] ENDOSCOPY;  Service: Endoscopy;  Laterality: N/A;  . EYE SURGERY     cosmetic surgery"brow lift" 4'15   . MULTIPLE PROCEDURES FOR LEFT ARM     ELBOW,RIB FX PNEUMOTHORAX AFTER FALLING OFF MOUNTAIN  . PILONIDAL CYST / SINUS EXCISION    . RADICAL PROSTATECTOMY         Home Medications    Prior to Admission medications   Medication Sig Start Date End Date Taking? Authorizing Provider  calcium carbonate (TUMS - DOSED IN MG ELEMENTAL CALCIUM) 500 MG chewable tablet Chew 1 tablet by mouth at bedtime.    Yes Historical Provider, MD  chlorhexidine (PERIDEX) 0.12 % solution RINSE 10 ML (CC) IN MOUTH TWICE DAILY AS DIRECTED **SWISH  AND  SPIT,  DO  NOT  SWALLOW** 09/12/16  Yes Gautam Juleen China, MD  ciprofloxacin (CIPRO) 500 MG tablet Take 1 tablet (500 mg total) by mouth 2 (two) times daily. 07/25/16  Yes Brunetta Genera, MD  furosemide (LASIX) 20 MG tablet Take 1 tablet (20 mg total) by mouth daily. 09/14/16  Yes Janece Canterbury, MD  magic mouthwash SOLN Take 5 mLs by mouth 4 (four) times daily as needed for mouth pain. 08/03/16  Yes Heath Lark, MD  Multiple Vitamin (MULTIVITAMIN WITH MINERALS) TABS tablet Take 1 tablet by mouth daily.   Yes Historical Provider, MD  omeprazole (PRILOSEC) 40 MG capsule Take 40 mg by mouth daily before lunch. 1/2 hour before.   Yes Historical Provider, MD  ondansetron (ZOFRAN) 4 MG tablet Take 4 mg by mouth every 8 (eight) hours as needed for nausea or vomiting.  05/07/16  Yes Historical Provider, MD  posaconazole (NOXAFIL) 100 MG TBEC delayed-release tablet Take 3 tablets (300 mg total) by mouth daily. Patient taking differently: Take 300 mg by mouth at bedtime.  08/22/16  Yes Brunetta Genera, MD  potassium chloride (K-DUR) 10 MEQ tablet Take 2 tablets (20 mEq total) by mouth daily. 09/14/16  Yes Janece Canterbury, MD  senna-docusate (SENNA S) 8.6-50 MG tablet Take 2 tablets by mouth 2 (two) times daily as needed for mild constipation or moderate constipation. 09/14/16  Yes Janece Canterbury, MD  valACYclovir (VALTREX) 500 MG tablet Take 1 tablet (500 mg total) by  mouth 2 (two) times daily. At night to suppress herpes gingivitis 08/22/16  Yes Brunetta Genera, MD  valsartan (DIOVAN) 80 MG tablet Take 40 mg by mouth at bedtime.    Yes Historical Provider, MD  nitroGLYCERIN (NITROSTAT) 0.4 MG SL tablet Place 1 tablet (0.4 mg total) under the tongue every 5 (five) minutes as needed for chest pain. 11/11/14   Sueanne Margarita, MD    Family History Family History  Problem Relation Age of Onset  . Emphysema Father   . Asthma Father   . Lung cancer Father   . Breast cancer Sister   . Breast cancer Sister   . Skin cancer Brother   . Prostate cancer Brother     Social History Social History  Substance Use Topics  . Smoking status: Former Smoker    Packs/day: 1.00    Years: 40.00    Quit date: 11/29/1995  . Smokeless tobacco: Never Used  . Alcohol use Yes     Comment: 1 drink per day     Allergies   Fluconazole  and Midazolam   Review of Systems Review of Systems  Constitutional: Positive for chills and fever.  Respiratory: Negative for cough and shortness of breath.   Gastrointestinal: Negative for abdominal pain.  Genitourinary: Negative for dysuria.  Neurological: Positive for tremors.  All other systems reviewed and are negative.   Physical Exam Updated Vital Signs BP 144/71   Pulse 91   Temp (!) 103.1 F (39.5 C) (Oral)   Resp (!) 31   Ht 5\' 7"  (1.702 m)   Wt 168 lb (76.2 kg)   SpO2 96%   BMI 26.31 kg/m   Physical Exam  Constitutional: He is oriented to person, place, and time. He appears well-developed and well-nourished. No distress.  Rigors.  HENT:  Head: Normocephalic and atraumatic.  Right Ear: Hearing normal.  Left Ear: Hearing normal.  Nose: Nose normal.  Mouth/Throat: Oropharynx is clear and moist and mucous membranes are normal.  Eyes: Conjunctivae and EOM are normal. Pupils are equal, round, and reactive to light.  Neck: Normal range of motion. Neck supple.  Cardiovascular: Regular rhythm, S1 normal and S2  normal.  Exam reveals no gallop and no friction rub.   No murmur heard. Pulmonary/Chest: Effort normal and breath sounds normal. No respiratory distress. He exhibits no tenderness.  Abdominal: Soft. Normal appearance and bowel sounds are normal. There is no hepatosplenomegaly. There is no tenderness. There is no rebound, no guarding, no tenderness at McBurney's point and negative Murphy's sign. No hernia.  Musculoskeletal: Normal range of motion.  Neurological: He is alert and oriented to person, place, and time. He has normal strength. No cranial nerve deficit or sensory deficit. Coordination normal. GCS eye subscore is 4. GCS verbal subscore is 5. GCS motor subscore is 6.  Skin: Skin is warm, dry and intact. No rash noted. No cyanosis.  Psychiatric: He has a normal mood and affect. His speech is normal and behavior is normal. Thought content normal.  Nursing note and vitals reviewed.   ED Treatments / Results  Labs (all labs ordered are listed, but only abnormal results are displayed) Labs Reviewed  COMPREHENSIVE METABOLIC PANEL - Abnormal; Notable for the following:       Result Value   Glucose, Bld 100 (*)    BUN 21 (*)    Calcium 8.3 (*)    Total Bilirubin 1.9 (*)    GFR calc non Af Amer 56 (*)    All other components within normal limits  CBC WITH DIFFERENTIAL/PLATELET - Abnormal; Notable for the following:    WBC 0.6 (*)    RBC 2.96 (*)    Hemoglobin 8.5 (*)    HCT 29.3 (*)    MCHC 29.0 (*)    RDW 20.4 (*)    Platelets 142 (*)    Neutro Abs 0.1 (*)    Lymphs Abs 0.5 (*)    Monocytes Absolute 0.0 (*)    All other components within normal limits  CULTURE, BLOOD (ROUTINE X 2)  CULTURE, BLOOD (ROUTINE X 2)  URINE CULTURE  URINALYSIS, ROUTINE W REFLEX MICROSCOPIC (NOT AT [facility])  INFLUENZA PANEL BY PCR (TYPE A & B, H1N1)  I-STAT CG4 LACTIC ACID, ED  I-STAT CG4 LACTIC ACID, ED    EKG  EKG Interpretation  Date/Time:  Wednesday September 14 2016 23:35:21 EDT Ventricular  Rate:  93 PR Interval:    QRS Duration: 97 QT Interval:  343 QTC Calculation: 427 R Axis:   -23 Text Interpretation:  Sinus rhythm Borderline left axis deviation Low  voltage, precordial leads No significant change since last tracing Confirmed by [facility]  MD, Louine Tenpenny [phone]) on 09/14/2016 11:38:51 PM       Radiology Dg Chest Port 1 View  Result Date: 09/15/2016 CLINICAL DATA:  75 year old male with fever. History of prostate cancer and melanoma with chemotherapy. EXAM: PORTABLE CHEST 1 VIEW COMPARISON:  Chest CT dated 09/12/2016 FINDINGS: Single-view of the chest demonstrate mild diffuse interstitial prominence. There is no focal consolidation, pleural effusion, or pneumothorax. The cardiac silhouette is within normal limits. No acute osseous pathology. IMPRESSION: No acute cardiopulmonary process. Electronically Signed   By: Anner Crete M.D.   On: 09/15/2016 00:42    Procedures Procedures (including critical care time)  DIAGNOSTIC STUDIES: Oxygen Saturation is 99% on RA, normal by my interpretation.    COORDINATION OF CARE: 11:39 PM Discussed treatment plan with pt at bedside and pt agreed to plan.  Medications Ordered in ED Medications  ceFEPIme (MAXIPIME) 2 g in dextrose 5 % 50 mL IVPB (not administered)  ondansetron (ZOFRAN) injection 4 mg (not administered)  ceFEPIme (MAXIPIME) 2 g in dextrose 5 % 50 mL IVPB (0 g Intravenous Stopped 09/15/16 0036)  acetaminophen (TYLENOL) tablet 650 mg (650 mg Oral Given 09/15/16 0148)     Initial Impression / Assessment and Plan / ED Course  I have reviewed the triage vital signs and the nursing notes.  Pertinent labs & imaging results that were available during my care of the patient were reviewed by me and considered in my medical decision making (see chart for details).  Clinical Course   Patient presents to the ER for evaluation of shaking chills. Patient has a history of myelodysplastic syndrome, currently receiving  chemotherapy. Initial oral temperature was not elevated but he felt very warm to the touch. Patient ultimately had MAXIMUM TEMPERATURE of 103.1 orally here in the ER. Rectal temp was not possible secondary to history of neutropenia and chemotherapy.  Patient found to be neutropenic. Source for fever is unclear at this time. Urinalysis was clear. Chest x-ray unremarkable. Blood and urine cultures pending. Influenza PCR pending. Patient basically treated with cefepime and will be admitted for further care.  I personally performed the services described in this documentation, which was scribed in my presence. The recorded information has been reviewed and is accurate.   Final Clinical Impressions(s) / ED Diagnoses   Final diagnoses:  Neutropenic fever ([place])    New Prescriptions New Prescriptions   No medications on file     Orpah Greek, MD 09/15/16 0202

## 2016-09-14 NOTE — Discharge Summary (Addendum)
Physician Discharge Summary  Warren [name] [id number] DOB: 09/29/1941 DOA: 09/12/2016  PCP: Wenda Low, MD  Admit date: 09/12/2016 Discharge date: 09/14/2016  Admitted From: home  Disposition:  home  Recommendations for Outpatient Follow-up:  1. Follow up with PCP in 1 week 2. Please obtain BMP/CBC in one week To follow-up potassium, kidney function, and pancytopenia 3. Follow-up with Dr. Irene Limbo in 1-2 weeks regarding side effects of recent chemotherapy  Home Health:  None  Equipment/Devices:  None  Discharge Condition:  Stable, improved CODE STATUS:  Full code  Diet recommendation:  Regular   Brief/Interim Summary:  Warren [name] is a 75 y.o. male with MDS, hypertension, COPD, OSA who presented with worsening shortness of breath and lower extremity edema.  CT angiogram of the chest demonstrated no evidence of pulmonary embolism but he had interstitial edema with small bilateral pleural effusions and a small pericardial effusion. He was admitted for acute respiratory failure possibly secondary to congestive heart failure. TSH was within normal limits. He was diuresed with Lasix 40 mg IV twice a day and record show that he is negative over 3 L over the last 24-48 hours. His respiratory distress completely resolved and he was able to ambulate without dyspnea and with stable oxygen levels prior to discharge. His troponins were negative. His BNP was not elevated and his echocardiogram demonstrated a preserved ejection fraction with normal diastolic parameters. He had minimally elevated pulmonary arterial pressures but I doubt that this is related to his shortness of breath.  Most likely he is having a side effect of his azacetidine.  He was started on Lasix 20 mg daily with potassium supplementation and advised to see his primary care doctor in 1 week to have his kidney and potassium levels rechecked. He should also see his oncologist in approximately 1-2 weeks to discuss the development of  the side effect from his chemotherapy. He was advised to stop his Lasix to be held he is becoming dehydrated. If he notices worsening swelling or shortness of breath may take 1 additional dose of his Lasix and talk to his doctor right away.  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  ECHO did not demonstrate evidence of systolic or diastolic dysfunction.  He was quickly diuresed with Lasix 40 mg IV and he was started on daily Lasix at discharge.  I did not recommend restricting his diet or fluid intake to enable him to maximize his nutrition prior to possible stem cell transplant.  He will need close outpatient follow-up with either his oncologist or his primary care doctor to trend his kidney function and potassium.  MDS followed by Dr. Irene Limbo.  His hemoglobin was trending down slightly but still within his normal range at the time of discharge. He did not require blood transfusion during this hospitalization. He continued his prophylactic antifungal and antibacterial and antiviral medications. Per his report, he is due to have restaging next week to determine if he will be a candidate for stem cell transplant at [facility].  Hypertension, mildly uncontrolled, continued ARB.  COPD, stable without wheeze.  Discharge Instructions  Discharge Instructions    AMB Referral to [place] [facility]    Complete  by:  As directed    Please assign to [place] for transition of care. Multiple co-morbidities. Likely discharge soon. Written consent obtained. Thanks. Warren Rolling, MSN-Ed, RN,BSN [facility] [id number]   Reason for consult:   Please assign to [place]   Expected date of contact:  1-3 days (reserved for hospital discharges)   Call MD for:  difficulty breathing, headache or visual disturbances    Complete by:  As directed    Call MD for:  extreme fatigue    Complete by:  As directed    Call MD for:  hives    Complete by:  As directed    Call MD for:  persistant dizziness or light-headedness    Complete by:  As directed    Call MD for:  persistant nausea and vomiting    Complete by:  As directed    Call MD for:  severe uncontrolled pain    Complete by:  As directed    Call MD for:  temperature >100.4    Complete by:  As directed    Diet general    Complete by:  As directed    Discharge instructions    Complete by:  As directed    Most likely you had a side effect of your chemotherapy.  We did not see signs of heart failure on the ultrasound of your heart.  Please take lasix and potassium every day unless you start to feel dehydrated.  Normally, I would ask that you restrict the salt in your diet, but in your case, nutrition is more important.  If you notice additional swelling or shortness of breath despite lasix, please take an extra dose of lasix and call your primary care doctor right away.  If you become very [place] of breath, return to the hospital.  Talk to Dr. Irene Limbo about your chemotherapy options and see your PCP in 1 week to have blood work to check your kidneys and potassium levels.   Increase activity slowly    Complete by:  As directed        Medication List    STOP taking these medications   Isavuconazonium Sulfate 186 MG Caps     TAKE these medications   calcium carbonate 500 MG chewable tablet Commonly known as:  TUMS - dosed in mg elemental calcium Chew 1 tablet by mouth at bedtime.   chlorhexidine 0.12 % solution Commonly known as:  PERIDEX RINSE 10 ML (CC) IN MOUTH TWICE DAILY AS DIRECTED **SWISH  AND  SPIT,  DO  NOT  SWALLOW**   ciprofloxacin 500 MG tablet Commonly known  as:  CIPRO Take 1 tablet (500 mg total) by mouth 2 (two) times daily.   furosemide 20 MG tablet Commonly known as:  LASIX Take 1 tablet (20 mg total) by mouth daily.   magic mouthwash Soln Take 5 mLs by mouth 4 (four) times daily as needed for mouth pain.   nitroGLYCERIN 0.4 MG SL tablet Commonly known as:  NITROSTAT Place 1 tablet (0.4 mg total) under the tongue every 5 (five) minutes as needed for chest pain.   omeprazole 40 MG capsule Commonly known as:  PRILOSEC Take 40 mg by mouth daily before lunch. 1/2 hour before.   ondansetron 4 MG tablet Commonly known as:  ZOFRAN Take 4 mg by mouth every 8 (eight) hours as needed for nausea or vomiting.   posaconazole 100 MG Tbec delayed-release tablet Commonly known as:  NOXAFIL Take 3 tablets (  300 mg total) by mouth daily. What changed:  when to take this   potassium chloride 10 MEQ tablet Commonly known as:  K-DUR Take 2 tablets (20 mEq total) by mouth daily.   senna-docusate 8.6-50 MG tablet Commonly known as:  SENNA S Take 2 tablets by mouth 2 (two) times daily as needed for mild constipation or moderate constipation.   valACYclovir 500 MG tablet Commonly known as:  VALTREX Take 1 tablet (500 mg total) by mouth 2 (two) times daily. At night to suppress herpes gingivitis   valsartan 80 MG tablet Commonly known as:  DIOVAN Take 40 mg by mouth at bedtime.      Follow-up Information    HUSAIN,KARRAR, MD. Schedule an appointment as soon as possible for a visit in 1 week(s).   Specialty:  Internal Medicine Why:  labs to check kidneys and potassium Contact information: [address] [place] [address] [phone]        Gautam Kale, MD. Schedule an appointment as soon as possible for a visit in 2 week(s).   Specialties:  Hematology, Oncology Why:  f/u chemotherapy and swelling Contact information: [place] [address] [id number]          Allergies  Allergen Reactions   . Fluconazole     Rash   . Midazolam Other (See Comments)    Agitated caused by versed    Consultations: none   Procedures/Studies: Dg Chest 2 View  Result Date: 09/12/2016 CLINICAL DATA:  Neutropenic patient with dyspnea and ankle swelling. Patient on chemotherapy. History of prostate cancer. EXAM: CHEST  2 VIEW COMPARISON:  07/17/2016 chest radiograph obtained 02/28/2013 chest CT. FINDINGS: The heart size and mediastinal contours are within normal limits. Both lungs are clear. Surgical screw is seen over the left humeral head. Stable T6 through T8 mild compression deformities since 2014 comparison accounting for mild mid thoracic kyphosis. No apparent blastic disease. IMPRESSION: No active cardiopulmonary disease. Electronically Signed   By: Ashley Royalty M.D.   On: 09/12/2016 18:02   Ct Angio Chest Pe W And/or Wo Contrast  Result Date: 09/13/2016 CLINICAL DATA:  Initial evaluation for acute shortness of breath. History of prostate cancer. EXAM: CT ANGIOGRAPHY CHEST WITH CONTRAST TECHNIQUE: Multidetector CT imaging of the chest was performed using the standard protocol during bolus administration of intravenous contrast. Multiplanar CT image reconstructions and MIPs were obtained to evaluate the vascular anatomy. CONTRAST:  100 cc of Isovue 370. COMPARISON:  Prior radiograph from earlier the same day. FINDINGS: Cardiovascular: Intrathoracic aorta of normal caliber without evidence for aneurysm or other acute abnormality. Visualized great vessels within normal limits. Mild cardiomegaly present. Scattered coronary artery calcifications noted. Small pericardial fusion. Pulmonary arterial tree adequately opacified for evaluation. Main pulmonary artery measures at the upper limits of normal for size at 3 cm in diameter. No filling defect to suggest acute pulmonary embolism. Re-formatted imaging confirms these findings. Mediastinum/Nodes: Thyroid normal. No pathologically enlarged mediastinal, hilar,  or axillary lymph nodes identified. Esophagus within normal limits. Lungs/Pleura: Small layering bilateral pleural effusions present. Mild scattered interlobular septal thickening within the lungs, suggesting mild pulmonary interstitial edema. Underlying emphysema. No focal infiltrates. No pneumothorax. No worrisome pulmonary nodule or mass. Few scattered granulomas noted within the left lung base. Upper Abdomen: 1.9 cm hypodense lesion within the liver compatible with a cyst. Additional sub cm hypodensity within the right hepatic lobe noted, too small the characterize, but may reflect a small cyst as well. Probable small  cyst noted within the right kidney. Remainder the visualized upper abdomen otherwise unremarkable. Musculoskeletal: No acute osseous abnormality. No worrisome lytic or blastic osseous lesion. Scattered mild degenerative changes within the mid thoracic spine. Review of the MIP images confirms the above findings. IMPRESSION: 1. No CT evidence for acute pulmonary embolism. 2. Mild diffuse interlobular septal thickening, suggestive of mild pulmonary interstitial edema. 3. Small layering bilateral pleural effusions. 4. Mild cardiomegaly with associated small pericardial fusion. 5. Emphysema. Electronically Signed   By: Jeannine Boga M.D.   On: 09/13/2016 00:02    ECHO:   - Left ventricle: The cavity size was normal. Systolic function was   normal. The estimated ejection fraction was in the range of 60%   to 65%. Wall motion was normal; there were no regional wall   motion abnormalities. Left ventricular diastolic function   parameters were normal. - Aortic valve: Trileaflet; mildly thickened, mildly calcified   leaflets. There was trivial regurgitation. - Mitral valve: There was trivial regurgitation. - Left atrium: The atrium was mildly dilated. - Pulmonary arteries: PA peak pressure: 38 mm Hg (S). - Pericardium, extracardiac: A trivial, free-flowing pericardial   effusion was  identified along the right ventricular free wall.   The fluid had no internal echoes.   Subjective:  Feels back to his baseline.  Does not feel SOB with ambulation around the room and his swelling has improved.    Discharge Exam: Vitals:   09/14/16 0543 09/14/16 1342  BP: (!) 144/59 125/60  Pulse: 61 71  Resp: 20 20  Temp: 98.1 F (36.7 C) 98.4 F (36.9 C)   Vitals:   09/13/16 1400 09/13/16 2206 09/14/16 0543 09/14/16 1342  BP: 137/71 122/64 (!) 144/59 125/60  Pulse: 61 62 61 71  Resp: 18 18 20 20   Temp: 98.6 F (37 C) 98.2 F (36.8 C) 98.1 F (36.7 C) 98.4 F (36.9 C)  TempSrc: Oral Oral Oral Oral  SpO2: 98% 97% 96% 100%  Weight:   72.1 kg (158 lb 15.2 oz)   Height:        General: Pt is alert, awake, not in acute distress Cardiovascular: RRR, S1/S2 +, no rubs, no gallops Respiratory: CTA bilaterally, no wheezing, no rhonchi Abdominal: Soft, NT, ND, bowel sounds + Extremities: trace pitting bilateral ankle edema, no cyanosis    The results of significant diagnostics from this hospitalization (including imaging, microbiology, ancillary and laboratory) are listed below for reference.     Microbiology: No results found for this or any previous visit (from the past 240 hour(s)).   Labs: BNP (last 3 results)  Recent Labs  09/12/16 1926  BNP [id number]   Basic Metabolic Panel:  Recent Labs Lab 09/12/16 1046 09/13/16 0315 09/14/16 0458  NA 142  --  142  K 3.9  --  3.5  CL  --   --  109  CO2 24  --  28  GLUCOSE 118  --  97  BUN 20.3  --  16  CREATININE 1.1 1.05 1.10  CALCIUM 8.4  --  8.2*   Liver Function Tests:  Recent Labs Lab 09/12/16 1046  AST 38*  ALT 31  ALKPHOS 59  BILITOT 1.37*  PROT 6.0*  ALBUMIN 3.2*   No results for input(s): LIPASE, AMYLASE in the last 168 hours. No results for input(s): AMMONIA in the last 168 hours. CBC:  Recent Labs Lab 09/12/16 1046 09/13/16 0315  WBC 1.2* 1.5*  NEUTROABS 0.1* 0.1*  HGB 7.9* 7.8*  HCT  27.3* 27.0*  MCV 97.8 98.5  PLT 124* 134*   Cardiac Enzymes:  Recent Labs Lab 09/13/16 0315 09/13/16 1000 09/13/16 1601  TROPONINI <0.03 <0.03 <0.03   BNP: Invalid input(s): POCBNP CBG: No results for input(s): GLUCAP in the last 168 hours. D-Dimer No results for input(s): DDIMER in the last 72 hours. Hgb A1c No results for input(s): HGBA1C in the last 72 hours. Lipid Profile No results for input(s): CHOL, HDL, LDLCALC, TRIG, CHOLHDL, LDLDIRECT in the last 72 hours. Thyroid function studies  Recent Labs  09/14/16 0827  TSH 0.848   Anemia work up  Recent Labs  09/12/16 1046  RETICCTPCT 4.59*   Urinalysis    Component Value Date/Time   COLORURINE YELLOW 07/17/2016 1700   APPEARANCEUR CLEAR 07/17/2016 1700   LABSPEC 1.006 07/17/2016 1700   PHURINE 7.0 07/17/2016 1700   GLUCOSEU NEGATIVE 07/17/2016 1700   HGBUR NEGATIVE 07/17/2016 1700   BILIRUBINUR NEGATIVE 07/17/2016 1700   KETONESUR NEGATIVE 07/17/2016 1700   PROTEINUR NEGATIVE 07/17/2016 1700   NITRITE NEGATIVE 07/17/2016 1700   LEUKOCYTESUR NEGATIVE 07/17/2016 1700   Sepsis Labs Invalid input(s): PROCALCITONIN,  WBC,  LACTICIDVEN   Time coordinating discharge: Over 30 minutes  SIGNED:   Janece Canterbury, MD  [facility] 09/14/2016, 5:02 PM Pager   If 7PM-7AM, please contact night-coverage [email or web address] Password [email or web address]

## 2016-09-14 NOTE — Care Management CC44 (Signed)
Condition Code 44 Documentation Completed  Patient Details  Name: LOPEZ OSTEN MRN: [id number] Date of Birth: [birth year]   Condition Code 44 given:  Yes Patient signature on Condition Code 44 notice:   Pt in isolation, not signed, explained and given to pt. Documentation of 2 MD's agreement:    Code 44 added to claim:       Purcell Mouton, RN 09/14/2016, 12:52 PM

## 2016-09-15 ENCOUNTER — Encounter (HOSPITAL_COMMUNITY): Payer: Self-pay | Admitting: Internal Medicine

## 2016-09-15 DIAGNOSIS — E860 Dehydration: Secondary | ICD-10-CM | POA: Diagnosis present

## 2016-09-15 DIAGNOSIS — G8929 Other chronic pain: Secondary | ICD-10-CM | POA: Diagnosis present

## 2016-09-15 DIAGNOSIS — R846 Abnormal cytological findings in specimens from respiratory organs and thorax: Secondary | ICD-10-CM | POA: Diagnosis not present

## 2016-09-15 DIAGNOSIS — R911 Solitary pulmonary nodule: Secondary | ICD-10-CM | POA: Diagnosis present

## 2016-09-15 DIAGNOSIS — R079 Chest pain, unspecified: Secondary | ICD-10-CM | POA: Diagnosis not present

## 2016-09-15 DIAGNOSIS — D649 Anemia, unspecified: Secondary | ICD-10-CM

## 2016-09-15 DIAGNOSIS — R0602 Shortness of breath: Secondary | ICD-10-CM | POA: Diagnosis not present

## 2016-09-15 DIAGNOSIS — M7989 Other specified soft tissue disorders: Secondary | ICD-10-CM | POA: Diagnosis not present

## 2016-09-15 DIAGNOSIS — M359 Systemic involvement of connective tissue, unspecified: Secondary | ICD-10-CM | POA: Diagnosis not present

## 2016-09-15 DIAGNOSIS — I2510 Atherosclerotic heart disease of native coronary artery without angina pectoris: Secondary | ICD-10-CM | POA: Diagnosis present

## 2016-09-15 DIAGNOSIS — Z85828 Personal history of other malignant neoplasm of skin: Secondary | ICD-10-CM | POA: Diagnosis not present

## 2016-09-15 DIAGNOSIS — D571 Sickle-cell disease without crisis: Secondary | ICD-10-CM | POA: Diagnosis not present

## 2016-09-15 DIAGNOSIS — M25572 Pain in left ankle and joints of left foot: Secondary | ICD-10-CM | POA: Diagnosis present

## 2016-09-15 DIAGNOSIS — R7881 Bacteremia: Secondary | ICD-10-CM | POA: Diagnosis not present

## 2016-09-15 DIAGNOSIS — R509 Fever, unspecified: Secondary | ICD-10-CM | POA: Diagnosis not present

## 2016-09-15 DIAGNOSIS — D696 Thrombocytopenia, unspecified: Secondary | ICD-10-CM | POA: Diagnosis not present

## 2016-09-15 DIAGNOSIS — Z87891 Personal history of nicotine dependence: Secondary | ICD-10-CM

## 2016-09-15 DIAGNOSIS — I509 Heart failure, unspecified: Secondary | ICD-10-CM | POA: Diagnosis present

## 2016-09-15 DIAGNOSIS — I272 Pulmonary hypertension, unspecified: Secondary | ICD-10-CM | POA: Diagnosis present

## 2016-09-15 DIAGNOSIS — R05 Cough: Secondary | ICD-10-CM | POA: Diagnosis not present

## 2016-09-15 DIAGNOSIS — R5081 Fever presenting with conditions classified elsewhere: Secondary | ICD-10-CM | POA: Diagnosis present

## 2016-09-15 DIAGNOSIS — G629 Polyneuropathy, unspecified: Secondary | ICD-10-CM | POA: Diagnosis present

## 2016-09-15 DIAGNOSIS — D709 Neutropenia, unspecified: Secondary | ICD-10-CM | POA: Diagnosis not present

## 2016-09-15 DIAGNOSIS — J9601 Acute respiratory failure with hypoxia: Secondary | ICD-10-CM | POA: Diagnosis present

## 2016-09-15 DIAGNOSIS — R071 Chest pain on breathing: Secondary | ICD-10-CM | POA: Diagnosis not present

## 2016-09-15 DIAGNOSIS — K7689 Other specified diseases of liver: Secondary | ICD-10-CM | POA: Diagnosis not present

## 2016-09-15 DIAGNOSIS — T451X5A Adverse effect of antineoplastic and immunosuppressive drugs, initial encounter: Secondary | ICD-10-CM | POA: Diagnosis present

## 2016-09-15 DIAGNOSIS — D4622 Refractory anemia with excess of blasts 2: Secondary | ICD-10-CM | POA: Diagnosis present

## 2016-09-15 DIAGNOSIS — D469 Myelodysplastic syndrome, unspecified: Secondary | ICD-10-CM

## 2016-09-15 DIAGNOSIS — A4101 Sepsis due to Methicillin susceptible Staphylococcus aureus: Principal | ICD-10-CM | POA: Diagnosis present

## 2016-09-15 DIAGNOSIS — J90 Pleural effusion, not elsewhere classified: Secondary | ICD-10-CM | POA: Diagnosis present

## 2016-09-15 DIAGNOSIS — Z8546 Personal history of malignant neoplasm of prostate: Secondary | ICD-10-CM

## 2016-09-15 DIAGNOSIS — I76 Septic arterial embolism: Secondary | ICD-10-CM | POA: Diagnosis present

## 2016-09-15 DIAGNOSIS — R350 Frequency of micturition: Secondary | ICD-10-CM | POA: Diagnosis not present

## 2016-09-15 DIAGNOSIS — J449 Chronic obstructive pulmonary disease, unspecified: Secondary | ICD-10-CM | POA: Diagnosis present

## 2016-09-15 DIAGNOSIS — Z452 Encounter for adjustment and management of vascular access device: Secondary | ICD-10-CM | POA: Diagnosis not present

## 2016-09-15 DIAGNOSIS — D7589 Other specified diseases of blood and blood-forming organs: Secondary | ICD-10-CM | POA: Diagnosis not present

## 2016-09-15 DIAGNOSIS — E876 Hypokalemia: Secondary | ICD-10-CM | POA: Diagnosis present

## 2016-09-15 DIAGNOSIS — Z79899 Other long term (current) drug therapy: Secondary | ICD-10-CM | POA: Diagnosis not present

## 2016-09-15 DIAGNOSIS — A4901 Methicillin susceptible Staphylococcus aureus infection, unspecified site: Secondary | ICD-10-CM | POA: Diagnosis not present

## 2016-09-15 DIAGNOSIS — R17 Unspecified jaundice: Secondary | ICD-10-CM

## 2016-09-15 DIAGNOSIS — I110 Hypertensive heart disease with heart failure: Secondary | ICD-10-CM | POA: Diagnosis present

## 2016-09-15 DIAGNOSIS — D61818 Other pancytopenia: Secondary | ICD-10-CM | POA: Diagnosis present

## 2016-09-15 DIAGNOSIS — I313 Pericardial effusion (noninflammatory): Secondary | ICD-10-CM | POA: Diagnosis not present

## 2016-09-15 DIAGNOSIS — R6520 Severe sepsis without septic shock: Secondary | ICD-10-CM | POA: Diagnosis not present

## 2016-09-15 DIAGNOSIS — M48061 Spinal stenosis, lumbar region without neurogenic claudication: Secondary | ICD-10-CM | POA: Diagnosis not present

## 2016-09-15 DIAGNOSIS — G4733 Obstructive sleep apnea (adult) (pediatric): Secondary | ICD-10-CM | POA: Diagnosis present

## 2016-09-15 DIAGNOSIS — E8770 Fluid overload, unspecified: Secondary | ICD-10-CM | POA: Diagnosis present

## 2016-09-15 DIAGNOSIS — Z9889 Other specified postprocedural states: Secondary | ICD-10-CM | POA: Diagnosis not present

## 2016-09-15 DIAGNOSIS — L03116 Cellulitis of left lower limb: Secondary | ICD-10-CM | POA: Diagnosis not present

## 2016-09-15 DIAGNOSIS — Z5181 Encounter for therapeutic drug level monitoring: Secondary | ICD-10-CM | POA: Diagnosis not present

## 2016-09-15 DIAGNOSIS — R8290 Unspecified abnormal findings in urine: Secondary | ICD-10-CM | POA: Diagnosis not present

## 2016-09-15 DIAGNOSIS — Z85820 Personal history of malignant melanoma of skin: Secondary | ICD-10-CM | POA: Diagnosis not present

## 2016-09-15 LAB — CBC WITH DIFFERENTIAL/PLATELET
BASOS ABS: 0 10*3/uL (ref 0.0–0.1)
BASOS PCT: 0 %
Basophils Absolute: 0 10*3/uL (ref 0.0–0.1)
Basophils Relative: 0 %
EOS ABS: 0 10*3/uL (ref 0.0–0.7)
EOS PCT: 2 %
Eosinophils Absolute: 0 10*3/uL (ref 0.0–0.7)
Eosinophils Relative: 8 %
HCT: 29.3 % — ABNORMAL LOW (ref 39.0–52.0)
HEMATOCRIT: 27.8 % — AB (ref 39.0–52.0)
Hemoglobin: 8.1 g/dL — ABNORMAL LOW (ref 13.0–17.0)
Hemoglobin: 8.5 g/dL — ABNORMAL LOW (ref 13.0–17.0)
LYMPHS ABS: 0.5 10*3/uL — AB (ref 0.7–4.0)
Lymphocytes Relative: 66 %
Lymphocytes Relative: 74 %
Lymphs Abs: 0.4 10*3/uL — ABNORMAL LOW (ref 0.7–4.0)
MCH: 29 pg (ref 26.0–34.0)
MCH: 28.7 pg (ref 26.0–34.0)
MCHC: 29.1 g/dL — ABNORMAL LOW (ref 30.0–36.0)
MCHC: 29 g/dL — AB (ref 30.0–36.0)
MCV: 99 fL (ref 78.0–100.0)
MCV: 99.6 fL (ref 78.0–100.0)
MONOS PCT: 8 %
Monocytes Absolute: 0 10*3/uL — ABNORMAL LOW (ref 0.1–1.0)
Monocytes Absolute: 0 10*3/uL — ABNORMAL LOW (ref 0.1–1.0)
Monocytes Relative: 5 %
NEUTROS ABS: 0.1 10*3/uL — AB (ref 1.7–7.7)
NEUTROS PCT: 16 %
Neutro Abs: 0.1 10*3/uL — ABNORMAL LOW (ref 1.7–7.7)
Neutrophils Relative %: 21 %
PLATELETS: 142 10*3/uL — AB (ref 150–400)
Platelets: 90 10*3/uL — ABNORMAL LOW (ref 150–400)
RBC: 2.79 MIL/uL — AB (ref 4.22–5.81)
RBC: 2.96 MIL/uL — ABNORMAL LOW (ref 4.22–5.81)
RDW: 20.4 % — ABNORMAL HIGH (ref 11.5–15.5)
RDW: 20.4 % — ABNORMAL HIGH (ref 11.5–15.5)
WBC: 0.5 10*3/uL — AB (ref 4.0–10.5)
WBC: 0.6 10*3/uL — CL (ref 4.0–10.5)

## 2016-09-15 LAB — URINALYSIS, ROUTINE W REFLEX MICROSCOPIC
BILIRUBIN URINE: NEGATIVE
Glucose, UA: NEGATIVE mg/dL
HGB URINE DIPSTICK: NEGATIVE
KETONES UR: NEGATIVE mg/dL
Leukocytes, UA: NEGATIVE
NITRITE: NEGATIVE
PH: 5.5 (ref 5.0–8.0)
Protein, ur: NEGATIVE mg/dL
Specific Gravity, Urine: 1.016 (ref 1.005–1.030)

## 2016-09-15 LAB — BLOOD CULTURE ID PANEL (REFLEXED)
ACINETOBACTER BAUMANNII: NOT DETECTED
CANDIDA ALBICANS: NOT DETECTED
CANDIDA GLABRATA: NOT DETECTED
CANDIDA KRUSEI: NOT DETECTED
Candida parapsilosis: NOT DETECTED
Candida tropicalis: NOT DETECTED
ENTEROBACTER CLOACAE COMPLEX: NOT DETECTED
ENTEROBACTERIACEAE SPECIES: NOT DETECTED
ENTEROCOCCUS SPECIES: NOT DETECTED
ESCHERICHIA COLI: NOT DETECTED
Haemophilus influenzae: NOT DETECTED
KLEBSIELLA OXYTOCA: NOT DETECTED
Klebsiella pneumoniae: NOT DETECTED
LISTERIA MONOCYTOGENES: NOT DETECTED
Methicillin resistance: NOT DETECTED
Neisseria meningitidis: NOT DETECTED
PSEUDOMONAS AERUGINOSA: NOT DETECTED
Proteus species: NOT DETECTED
STAPHYLOCOCCUS AUREUS BCID: DETECTED — AB
STREPTOCOCCUS AGALACTIAE: NOT DETECTED
STREPTOCOCCUS PNEUMONIAE: NOT DETECTED
STREPTOCOCCUS PYOGENES: NOT DETECTED
Serratia marcescens: NOT DETECTED
Staphylococcus species: DETECTED — AB
Streptococcus species: NOT DETECTED

## 2016-09-15 LAB — COMPREHENSIVE METABOLIC PANEL
ALBUMIN: 4.2 g/dL (ref 3.5–5.0)
ALK PHOS: 54 U/L (ref 38–126)
ALT: 30 U/L (ref 17–63)
ALT: 34 U/L (ref 17–63)
AST: 38 U/L (ref 15–41)
AST: 40 U/L (ref 15–41)
Albumin: 3.5 g/dL (ref 3.5–5.0)
Alkaline Phosphatase: 45 U/L (ref 38–126)
Anion gap: 6 (ref 5–15)
Anion gap: 7 (ref 5–15)
BILIRUBIN TOTAL: 1.9 mg/dL — AB (ref 0.3–1.2)
BUN: 21 mg/dL — AB (ref 6–20)
BUN: 21 mg/dL — AB (ref 6–20)
CALCIUM: 8.3 mg/dL — AB (ref 8.9–10.3)
CHLORIDE: 104 mmol/L (ref 101–111)
CO2: 25 mmol/L (ref 22–32)
CO2: 27 mmol/L (ref 22–32)
CREATININE: 1.22 mg/dL (ref 0.61–1.24)
Calcium: 8.2 mg/dL — ABNORMAL LOW (ref 8.9–10.3)
Chloride: 105 mmol/L (ref 101–111)
Creatinine, Ser: 1.21 mg/dL (ref 0.61–1.24)
GFR calc Af Amer: >60 mL/min (ref >60)
GFR calc non Af Amer: 56 mL/min — ABNORMAL LOW (ref >60)
GFR, EST NON AFRICAN AMERICAN: 55 mL/min — AB (ref >60)
GLUCOSE: 100 mg/dL — AB (ref 65–99)
Glucose, Bld: 104 mg/dL — ABNORMAL HIGH (ref 65–99)
POTASSIUM: 3.5 mmol/L (ref 3.5–5.1)
Potassium: 3.6 mmol/L (ref 3.5–5.1)
SODIUM: 135 mmol/L (ref 135–145)
Sodium: 139 mmol/L (ref 135–145)
TOTAL PROTEIN: 6.8 g/dL (ref 6.5–8.1)
Total Bilirubin: 2 mg/dL — ABNORMAL HIGH (ref 0.3–1.2)
Total Protein: 5.9 g/dL — ABNORMAL LOW (ref 6.5–8.1)

## 2016-09-15 LAB — INFLUENZA PANEL BY PCR (TYPE A & B)
H1N1FLUPCR: NOT DETECTED
INFLAPCR: NEGATIVE
INFLBPCR: NEGATIVE

## 2016-09-15 LAB — LACTIC ACID, PLASMA: Lactic Acid, Venous: 1 mmol/L (ref 0.5–1.9)

## 2016-09-15 LAB — PREPARE RBC (CROSSMATCH)

## 2016-09-15 MED ORDER — POSACONAZOLE 100 MG PO TBEC
300.0000 mg | DELAYED_RELEASE_TABLET | Freq: Every day | ORAL | Status: DC
  Administered 2016-09-15 – 2016-09-25 (×11): 300 mg via ORAL
  Filled 2016-09-15 (×12): qty 3

## 2016-09-15 MED ORDER — CHLORHEXIDINE GLUCONATE 0.12 % MT SOLN
5.0000 mL | Freq: Two times a day (BID) | OROMUCOSAL | Status: DC
  Administered 2016-09-15 – 2016-09-23 (×17): 5 mL via OROMUCOSAL
  Administered 2016-09-24: via OROMUCOSAL
  Administered 2016-09-24 – 2016-09-26 (×3): 5 mL via OROMUCOSAL
  Filled 2016-09-15 (×20): qty 15

## 2016-09-15 MED ORDER — HEPARIN SOD (PORK) LOCK FLUSH 100 UNIT/ML IV SOLN
250.0000 [IU] | INTRAVENOUS | Status: DC | PRN
  Filled 2016-09-15: qty 3

## 2016-09-15 MED ORDER — SENNOSIDES-DOCUSATE SODIUM 8.6-50 MG PO TABS
2.0000 | ORAL_TABLET | Freq: Two times a day (BID) | ORAL | Status: DC | PRN
  Administered 2016-09-15 – 2016-09-18 (×3): 2 via ORAL
  Filled 2016-09-15 (×3): qty 2

## 2016-09-15 MED ORDER — MAGIC MOUTHWASH
5.0000 mL | Freq: Four times a day (QID) | ORAL | Status: DC | PRN
  Filled 2016-09-15: qty 5

## 2016-09-15 MED ORDER — CALCIUM CARBONATE ANTACID 500 MG PO CHEW
1.0000 | CHEWABLE_TABLET | Freq: Every day | ORAL | Status: DC
  Administered 2016-09-15 – 2016-09-25 (×7): 200 mg via ORAL
  Filled 2016-09-15 (×11): qty 1

## 2016-09-15 MED ORDER — OSELTAMIVIR PHOSPHATE 30 MG PO CAPS
30.0000 mg | ORAL_CAPSULE | Freq: Two times a day (BID) | ORAL | Status: DC
  Administered 2016-09-15: 30 mg via ORAL
  Filled 2016-09-15 (×3): qty 1

## 2016-09-15 MED ORDER — SODIUM CHLORIDE 0.9% FLUSH: 3.0000 mL | INTRAVENOUS | Status: DC | PRN

## 2016-09-15 MED ORDER — VANCOMYCIN HCL IN DEXTROSE 750-5 MG/150ML-% IV SOLN
750.0000 mg | Freq: Two times a day (BID) | INTRAVENOUS | Status: DC
  Administered 2016-09-15 – 2016-09-16 (×2): 750 mg via INTRAVENOUS
  Filled 2016-09-15 (×3): qty 150

## 2016-09-15 MED ORDER — SODIUM CHLORIDE 0.9 % IV SOLN: 250.0000 mL | Freq: Once | INTRAVENOUS | Status: DC

## 2016-09-15 MED ORDER — ACETAMINOPHEN 325 MG PO TABS
650.0000 mg | ORAL_TABLET | Freq: Four times a day (QID) | ORAL | Status: DC | PRN
  Administered 2016-09-15 – 2016-09-19 (×12): 650 mg via ORAL
  Filled 2016-09-15 (×13): qty 2

## 2016-09-15 MED ORDER — SODIUM CHLORIDE 0.9% FLUSH
10.0000 mL | INTRAVENOUS | Status: AC | PRN
  Administered 2016-09-21: 10 mL

## 2016-09-15 MED ORDER — IRBESARTAN 75 MG PO TABS
37.5000 mg | ORAL_TABLET | Freq: Every day | ORAL | Status: DC
  Administered 2016-09-15 – 2016-09-26 (×12): 37.5 mg via ORAL
  Filled 2016-09-15 (×6): qty 0.5
  Filled 2016-09-15: qty 1
  Filled 2016-09-15 (×2): qty 0.5
  Filled 2016-09-15: qty 1
  Filled 2016-09-15 (×2): qty 0.5

## 2016-09-15 MED ORDER — PANTOPRAZOLE SODIUM 40 MG PO TBEC
40.0000 mg | DELAYED_RELEASE_TABLET | Freq: Every day | ORAL | Status: DC
Start: 2016-09-15 — End: 2016-09-26
  Administered 2016-09-15 – 2016-09-26 (×12): 40 mg via ORAL
  Filled 2016-09-15 (×12): qty 1

## 2016-09-15 MED ORDER — ACETAMINOPHEN 325 MG PO TABS
650.0000 mg | ORAL_TABLET | Freq: Once | ORAL | Status: DC
  Filled 2016-09-15: qty 2

## 2016-09-15 MED ORDER — DEXTROSE 5 % IV SOLN
2.0000 g | Freq: Three times a day (TID) | INTRAVENOUS | Status: DC
  Administered 2016-09-15 – 2016-09-16 (×4): 2 g via INTRAVENOUS
  Filled 2016-09-15 (×4): qty 2

## 2016-09-15 MED ORDER — ONDANSETRON HCL 4 MG/2ML IJ SOLN: 4.0000 mg | Freq: Four times a day (QID) | INTRAMUSCULAR | Status: DC | PRN

## 2016-09-15 MED ORDER — ONDANSETRON HCL 4 MG PO TABS
4.0000 mg | ORAL_TABLET | Freq: Three times a day (TID) | ORAL | Status: DC | PRN
  Filled 2016-09-15: qty 1

## 2016-09-15 MED ORDER — ENOXAPARIN SODIUM 40 MG/0.4ML ~~LOC~~ SOLN
40.0000 mg | SUBCUTANEOUS | Status: DC
  Administered 2016-09-15: 40 mg via SUBCUTANEOUS
  Filled 2016-09-15: qty 0.4

## 2016-09-15 MED ORDER — ACETAMINOPHEN 325 MG PO TABS
650.0000 mg | ORAL_TABLET | Freq: Once | ORAL | Status: AC
  Administered 2016-09-15: 650 mg via ORAL
  Filled 2016-09-15: qty 2

## 2016-09-15 MED ORDER — HEPARIN SOD (PORK) LOCK FLUSH 100 UNIT/ML IV SOLN
500.0000 [IU] | Freq: Every day | INTRAVENOUS | Status: DC | PRN
  Filled 2016-09-15: qty 5

## 2016-09-15 MED ORDER — ADULT MULTIVITAMIN W/MINERALS CH
1.0000 | ORAL_TABLET | Freq: Every day | ORAL | Status: DC
  Administered 2016-09-15 – 2016-09-26 (×12): 1 via ORAL
  Filled 2016-09-15 (×12): qty 1

## 2016-09-15 MED ORDER — FUROSEMIDE 20 MG PO TABS
20.0000 mg | ORAL_TABLET | Freq: Every day | ORAL | Status: DC
  Administered 2016-09-15: 20 mg via ORAL
  Filled 2016-09-15: qty 1

## 2016-09-15 MED ORDER — POTASSIUM CHLORIDE ER 10 MEQ PO TBCR
20.0000 meq | EXTENDED_RELEASE_TABLET | Freq: Every day | ORAL | Status: DC
  Administered 2016-09-15: 20 meq via ORAL
  Filled 2016-09-15 (×3): qty 2

## 2016-09-15 MED ORDER — NITROGLYCERIN 0.4 MG SL SUBL: 0.4000 mg | SUBLINGUAL_TABLET | SUBLINGUAL | Status: DC | PRN

## 2016-09-15 MED ORDER — DIPHENHYDRAMINE HCL 25 MG PO CAPS
25.0000 mg | ORAL_CAPSULE | Freq: Once | ORAL | Status: DC
  Filled 2016-09-15: qty 1

## 2016-09-15 MED ORDER — ONDANSETRON HCL 4 MG/2ML IJ SOLN
4.0000 mg | Freq: Once | INTRAMUSCULAR | Status: AC
  Administered 2016-09-15: 4 mg via INTRAVENOUS
  Filled 2016-09-15: qty 2

## 2016-09-15 MED ORDER — VALACYCLOVIR HCL 500 MG PO TABS
500.0000 mg | ORAL_TABLET | Freq: Two times a day (BID) | ORAL | Status: DC
  Administered 2016-09-15 – 2016-09-26 (×23): 500 mg via ORAL
  Filled 2016-09-15 (×24): qty 1

## 2016-09-15 MED ORDER — ONDANSETRON HCL 4 MG PO TABS
4.0000 mg | ORAL_TABLET | Freq: Four times a day (QID) | ORAL | Status: DC | PRN
  Administered 2016-09-16: 4 mg via ORAL

## 2016-09-15 MED ORDER — ACETAMINOPHEN 650 MG RE SUPP: 650.0000 mg | Freq: Four times a day (QID) | RECTAL | Status: DC | PRN

## 2016-09-15 NOTE — Consult Note (Signed)
   [facility] CM Inpatient Consult   09/15/2016  [name] [birth year] [id number]    Mr. Warren [name] recently signed up with [facility] on 09/13/16 of this week. He is now back in hospital. Spoke with his wife to discuss [place] [facility] follow up. [facility] Care Management information and contact information provided again. Warren [name] also inquires about home health being arranged for physical therapy. Explained the difference between home health and [place] [facility] program. Made her aware that writer will pass information along to inpatient [facility] regarding home health PT.   Sent notification to inpatient RNCM to make aware that Warren [name] is active with [place] [facility] and request for home health PT from patient's wife.   Will continue to follow and alert [facility] RNCM of hospital admission.    Marthenia Rolling, MSN-Ed, RN,BSN [facility] Liaison [phone]

## 2016-09-15 NOTE — ED Notes (Signed)
MD aware of temperature.  

## 2016-09-15 NOTE — Progress Notes (Signed)
PHARMACY - PHYSICIAN COMMUNICATION CRITICAL VALUE ALERT - BLOOD CULTURE IDENTIFICATION (BCID)  Results for orders placed or performed during the hospital encounter of 09/14/16  Blood Culture ID Panel (Reflexed) (Collected: 09/14/2016 11:42 PM)  Result Value Ref Range   Enterococcus species NOT DETECTED NOT DETECTED   Listeria monocytogenes NOT DETECTED NOT DETECTED   Staphylococcus species DETECTED (A) NOT DETECTED   Staphylococcus aureus DETECTED (A) NOT DETECTED   Methicillin resistance NOT DETECTED NOT DETECTED   Streptococcus species NOT DETECTED NOT DETECTED   Streptococcus agalactiae NOT DETECTED NOT DETECTED   Streptococcus pneumoniae NOT DETECTED NOT DETECTED   Streptococcus pyogenes NOT DETECTED NOT DETECTED   Acinetobacter baumannii NOT DETECTED NOT DETECTED   Enterobacteriaceae species NOT DETECTED NOT DETECTED   Enterobacter cloacae complex NOT DETECTED NOT DETECTED   Escherichia coli NOT DETECTED NOT DETECTED   Klebsiella oxytoca NOT DETECTED NOT DETECTED   Klebsiella pneumoniae NOT DETECTED NOT DETECTED   Proteus species NOT DETECTED NOT DETECTED   Serratia marcescens NOT DETECTED NOT DETECTED   Haemophilus influenzae NOT DETECTED NOT DETECTED   Neisseria meningitidis NOT DETECTED NOT DETECTED   Pseudomonas aeruginosa NOT DETECTED NOT DETECTED   Candida albicans NOT DETECTED NOT DETECTED   Candida glabrata NOT DETECTED NOT DETECTED   Candida krusei NOT DETECTED NOT DETECTED   Candida parapsilosis NOT DETECTED NOT DETECTED   Candida tropicalis NOT DETECTED NOT DETECTED    Name of physician (or Provider) Contacted: [place]  Changes to prescribed antibiotics required: None, currently on Vancomycin & Cefepime  Nyoka Cowden, Wane Mollett L 09/15/2016  10:00 PM

## 2016-09-15 NOTE — Progress Notes (Signed)
Pharmacy Antibiotic Note  Warren [name] is a 75 y.o. male admitted on 09/14/2016 with febrile neutropenia.  Pharmacy had been consulted for cefepime dosing, now adding vancomycin.  Plan: Vancomycin 750mg  IV q12h Continue Cefepime 2Gm IV q8h Follow renal function, cultures, clinical course  Height: 5\' 7"  (170.2 cm) Weight: 163 lb (73.9 kg) IBW/kg (Calculated) : 66.1  Temp (24hrs), Avg:100.9 F (38.3 C), Min:98.2 F (36.8 C), Max:103.1 F (39.5 C)   Recent Labs Lab 09/12/16 1046 09/13/16 0315 09/14/16 0458 09/14/16 2338 09/14/16 2350 09/15/16 0700  WBC 1.2* 1.5*  --  0.6*  --  0.5*  CREATININE 1.1 1.05 1.10 1.21  --  1.22  LATICACIDVEN  --   --   --   --  1.52 1.0    Estimated Creatinine Clearance: 47.4 mL/min (by C-G formula based on SCr of 1.22 mg/dL).    Allergies  Allergen Reactions  . Fluconazole     Rash   . Midazolam Other (See Comments)    Agitated caused by versed    Antimicrobials this admission: 10/19 cefepime >>   10/19 vancomycin >>   Dose adjustments this admission:   Microbiology results:  10/18 BCx: sent 10/19 UCx:  sent  Sputum:    MRSA PCR:   Thank you for allowing pharmacy to be a part of this patient's care.  Dolly Rias RPh 09/15/2016, 10:55 AM Pager [phone]

## 2016-09-15 NOTE — Telephone Encounter (Signed)
Left message for patient that Dr. Radford Pax reviewed his records and if her assistance is needed, she will be asked. Also left message that it is fine to wait until upcoming appointment to see Dr. Radford Pax. Instructed him to call back if he has any questions or concerns at all.

## 2016-09-15 NOTE — Progress Notes (Signed)
[facility] PROGRESS NOTE  VANDAN KUSH [id number] DOB: 1941/08/03 DOA: 09/14/2016  PCP: Wenda Low, MD  Brief History/Interval Summary: Warren [name] is a 75 y.o. male with MDS who was admitted recently for shortness of breath and respiratory failure with fluid overload suspected to be secondary to chemotherapy. Patient was discharged home on 10/18 and then subsequently developed fever and chills at home and so he came back to the hospital. Patient found to have neutropenia. He was hospitalized for further management.  Reason for Visit: Neutropenic fever  Consultants: Oncology  Procedures: None  Antibiotics: Vancomycin, cefepime Tamiflu initiated 10/19  Subjective/Interval History: Patient feels weak. Complains of some difficulty breathing at times. Has had a dry cough. Some pain in the right part of his chest which he feels is muscular.   ROS: Denies any nausea, vomiting, abdominal pain, diarrhea. Denies any discomfort with urination.  Objective:  Vital Signs  Vitals:   09/15/16 0541 09/15/16 0719 09/15/16 0819 09/15/16 1149  BP: 135/62   135/63  Pulse: 98   90  Resp: 20   20  Temp: (!) 101.1 F (38.4 C) (!) 102.1 F (38.9 C) (!) 101.1 F (38.4 C) 98.4 F (36.9 C)  TempSrc: Oral Oral Oral Oral  SpO2: 95%   100%  Weight:      Height:        Intake/Output Summary (Last 24 hours) at 09/15/16 1302 Last data filed at 09/15/16 0843  Gross per 24 hour  Intake               50 ml  Output                0 ml  Net               50 ml   Filed Weights   09/14/16 2254 09/15/16 0230  Weight: 76.2 kg (168 lb) 73.9 kg (163 lb)    General appearance: alert, cooperative, appears stated age and no distress Resp: Slightly tachypneic. Clear to auscultation bilaterally with no wheezing, rales or rhonchi. Good air entry bilaterally. Cardio: regular rate and rhythm, S1, S2 normal, no murmur, click, rub or gallop GI: soft, non-tender; bowel sounds normal; no  masses,  no organomegaly Extremities: extremities normal, atraumatic, no cyanosis or edema Neurologic: Awake and alert. Oriented 3. Cranial nerves II-12 intact. Motor strength is equal bilateral upper and lower extremities. No skin rashes noted. No evidence for inflammation over joints.  Lab Results:  Data Reviewed: I have personally reviewed following labs and imaging studies  CBC:  Recent Labs Lab 09/12/16 1046 09/13/16 0315 09/14/16 2338 09/15/16 0700  WBC 1.2* 1.5* 0.6* 0.5*  NEUTROABS 0.1* 0.1* 0.1* 0.1*  HGB 7.9* 7.8* 8.5* 8.1*  HCT 27.3* 27.0* 29.3* 27.8*  MCV 97.8 98.5 99.0 99.6  PLT 124* 134* 142* 90*    Basic Metabolic Panel:  Recent Labs Lab 09/12/16 1046 09/13/16 0315 09/14/16 0458 09/14/16 2338 09/15/16 0700  NA 142  --  142 139 135  K 3.9  --  3.5 3.6 3.5  CL  --   --  109 105 104  CO2 24  --  28 27 25   GLUCOSE 118  --  97 100* 104*  BUN 20.3  --  16 21* 21*  CREATININE 1.1 1.05 1.10 1.21 1.22  CALCIUM 8.4  --  8.2* 8.3* 8.2*    GFR: Estimated Creatinine Clearance: 47.4 mL/min (by C-G formula based on SCr of 1.22 mg/dL).  Liver Function  Tests:  Recent Labs Lab 09/12/16 1046 09/14/16 2338 09/15/16 0700  AST 38* 40 38  ALT 31 34 30  ALKPHOS 59 54 45  BILITOT 1.37* 1.9* 2.0*  PROT 6.0* 6.8 5.9*  ALBUMIN 3.2* 4.2 3.5    Cardiac Enzymes:  Recent Labs Lab 09/13/16 0315 09/13/16 1000 09/13/16 1601  TROPONINI <0.03 <0.03 <0.03    Thyroid Function Tests:  Recent Labs  09/14/16 0827  TSH 0.848    Radiology Studies: Dg Chest Port 1 View  Result Date: 09/15/2016 CLINICAL DATA:  75 year old male with fever. History of prostate cancer and melanoma with chemotherapy. EXAM: PORTABLE CHEST 1 VIEW COMPARISON:  Chest CT dated 09/12/2016 FINDINGS: Single-view of the chest demonstrate mild diffuse interstitial prominence. There is no focal consolidation, pleural effusion, or pneumothorax. The cardiac silhouette is within normal limits. No  acute osseous pathology. IMPRESSION: No acute cardiopulmonary process. Electronically Signed   By: Anner Crete M.D.   On: 09/15/2016 00:42     Medications:  Scheduled: . calcium carbonate  1 tablet Oral QHS  . ceFEPime (MAXIPIME) IV  2 g Intravenous Q8H  . chlorhexidine  5 mL Mouth/Throat BID  . enoxaparin (LOVENOX) injection  40 mg Subcutaneous Q24H  . furosemide  20 mg Oral Daily  . irbesartan  37.5 mg Oral Daily  . multivitamin with minerals  1 tablet Oral Daily  . oseltamivir  30 mg Oral BID  . pantoprazole  40 mg Oral Daily  . posaconazole  300 mg Oral QHS  . potassium chloride  20 mEq Oral Daily  . valACYclovir  500 mg Oral BID  . vancomycin  750 mg Intravenous Q12H   Continuous:  [id number] **OR** acetaminophen, magic mouthwash, nitroGLYCERIN, ondansetron **OR** ondansetron (ZOFRAN) IV, ondansetron, senna-docusate  Assessment/Plan:  Principal Problem:   Neutropenic fever (HCC) Active Problems:   Thrombocytopenia (HCC)   RAEB-2 (refractory anemia with excess blasts-2) (HCC)   MDS (myelodysplastic syndrome) (HCC)    Neutropenic fever Etiology is unclear so far. Patient was initially placed on cefepime. Since he has been on multiple antibiotics recently and has been in the hospital, we will also initiate intravenous vancomycin. Follow blood cultures. Continue patient's home dose of posaconazole and valacyclovir. Influenza PCR is pending. Empirically start Tamiflu. Continue droplet precautions. Discussed consulting infectious disease with patient. He is okay with holding off for now.   Myelodysplastic syndrome with pancytopenia Being followed by Dr. Irene Limbo and also at [facility] for possible stem cell transplant. Continue to monitor CBC. No role for Neupogen at this time due to presence of blasts. Transfuse as needed. No evidence for any bleeding.  Recently admitted for fluid overload Most likely from chemotherapy related. Echocardiogram showed preserved LV  function with no diastolic dysfunction. Continue Lasix for now and closely follow respiratory status and metabolic panel.  Dyspnea in the setting of known COPD Stable. Continue to monitor. Dyspnea noted could be due to fever. Influenza PCR is pending. He did have a CT scan recently which was negative for PE. Continue to monitor for now. No concerning findings noted on the EKG.  DVT Prophylaxis: Lovenox    Code Status: Full code  Family Communication: Discussed with the patient. No family at bedside  Disposition Plan: Continue management as outlined above.    LOS: 0 days   [facility] Pager [phone] 09/15/2016, 1:02 PM  If 7PM-7AM, please contact night-coverage at [email or web address], password [facility]
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
## 2016-09-15 NOTE — Progress Notes (Signed)
pts oral temp 102.1. Notified MD> gave pt 650tyleno. Will cont to monitor.

## 2016-09-15 NOTE — Progress Notes (Signed)
Pharmacy Antibiotic Note  Warren [name] is a 75 y.o. male admitted on 09/14/2016 with febrile neutropenia.  Pharmacy has been consulted for cefepime dosing.  Plan: Cefepime 2Gm IV q8h  Height: 5\' 7"  (170.2 cm) Weight: 168 lb (76.2 kg) IBW/kg (Calculated) : 66.1  Temp (24hrs), Avg:98.7 F (37.1 C), Min:98.1 F (36.7 C), Max:99.6 F (37.6 C)   Recent Labs Lab 09/12/16 1046 09/13/16 0315 09/14/16 0458 09/14/16 2350  WBC 1.2* 1.5*  --   --   CREATININE 1.1 1.05 1.10  --   LATICACIDVEN  --   --   --  1.52    Estimated Creatinine Clearance: 52.6 mL/min (by C-G formula based on SCr of 1.1 mg/dL).    Allergies  Allergen Reactions  . Fluconazole     Rash   . Midazolam Other (See Comments)    Agitated caused by versed    Antimicrobials this admission: 10/19 cefepime >>    >>   Dose adjustments this admission:   Microbiology results:  BCx:   UCx:    Sputum:    MRSA PCR:   Thank you for allowing pharmacy to be a part of this patient's care.  Dorrene German 09/15/2016 12:06 AM

## 2016-09-16 ENCOUNTER — Other Ambulatory Visit: Payer: Self-pay | Admitting: General Surgery

## 2016-09-16 ENCOUNTER — Other Ambulatory Visit: Payer: Medicare Other

## 2016-09-16 ENCOUNTER — Inpatient Hospital Stay (HOSPITAL_COMMUNITY): Payer: Medicare Other

## 2016-09-16 DIAGNOSIS — D696 Thrombocytopenia, unspecified: Secondary | ICD-10-CM

## 2016-09-16 DIAGNOSIS — R7881 Bacteremia: Secondary | ICD-10-CM | POA: Diagnosis present

## 2016-09-16 LAB — CBC
HCT: 30 % — ABNORMAL LOW (ref 39.0–52.0)
Hemoglobin: 8.9 g/dL — ABNORMAL LOW (ref 13.0–17.0)
MCH: 28.8 pg (ref 26.0–34.0)
MCHC: 29.7 g/dL — ABNORMAL LOW (ref 30.0–36.0)
MCV: 97.1 fL (ref 78.0–100.0)
PLATELETS: 112 10*3/uL — AB (ref 150–400)
RBC: 3.09 MIL/uL — AB (ref 4.22–5.81)
RDW: 21.8 % — ABNORMAL HIGH (ref 11.5–15.5)
WBC: 1.2 10*3/uL — AB (ref 4.0–10.5)

## 2016-09-16 LAB — URINE CULTURE: CULTURE: NO GROWTH

## 2016-09-16 MED ORDER — CEFAZOLIN SODIUM-DEXTROSE 2-4 GM/100ML-% IV SOLN
2.0000 g | Freq: Three times a day (TID) | INTRAVENOUS | Status: DC
  Administered 2016-09-16 – 2016-09-26 (×31): 2 g via INTRAVENOUS
  Filled 2016-09-16 (×33): qty 100

## 2016-09-16 MED ORDER — SODIUM CHLORIDE 0.9 % IV SOLN
Freq: Once | INTRAVENOUS | Status: AC
  Administered 2016-09-22: 10 mL/h via INTRAVENOUS

## 2016-09-16 MED ORDER — POLYETHYLENE GLYCOL 3350 17 G PO PACK
17.0000 g | PACK | Freq: Every day | ORAL | Status: DC
  Administered 2016-09-16 – 2016-09-24 (×4): 17 g via ORAL
  Filled 2016-09-16 (×11): qty 1

## 2016-09-16 NOTE — Progress Notes (Signed)
OT Cancellation Note  Patient Details Name: Warren [name] MRN: [id number] DOB: July 20, 1941   Cancelled Treatment:    Reason Eval/Treat Not Completed: OT screened, no needs identified, will sign off. Per PT, pt independent with ADLs   Britt Bottom 09/16/2016, 1:00 PM

## 2016-09-16 NOTE — Evaluation (Signed)
Physical Therapy Evaluation Patient Details Name: Warren [name] MRN: [id number] DOB: Mar 28, 1941 Today's Date: 09/16/2016   History of Present Illness  75 y.o. with h/o CHF, COPD, prostate cancer, myelodysplastic syndrome, on chemotherapy admitted with neutropenic fever. He reports stiffness, pain, and decreased mobility in his neck and would like to address that.   Clinical Impression  Pt admitted with above diagnosis. Pt currently with functional limitations due to the deficits listed below (see PT Problem List). Pt is independent with bed mobility, transfers, and ambulation. He has decreased postural awareness and decreased AROM in his neck. Instructed pt in neck stretches and strengthening exercises, and discussed postural correction of forward head position.  Pt will benefit from skilled PT to increase their independence and safety with mobility to allow discharge to the venue listed below.       Follow Up Recommendations Outpatient PT    Equipment Recommendations  None recommended by PT    Recommendations for Other Services       Precautions / Restrictions Precautions Precautions: Other (comment) Precaution Comments: neutropenic Restrictions Weight Bearing Restrictions: No      Mobility  Bed Mobility Overal bed mobility: Independent                Transfers Overall transfer level: Independent                  Ambulation/Gait Ambulation/Gait assistance: Independent Ambulation Distance (Feet): 100 Feet Assistive device: None Gait Pattern/deviations: WFL(Within Functional Limits)   Gait velocity interpretation: at or above normal speed for age/gender General Gait Details: pt ambulated in room, due to neutropenia he did not want to go into the hallway, steady with no LOB  Stairs            Wheelchair Mobility    Modified Rankin (Stroke Patients Only)       Balance Overall balance assessment: Independent                                            Pertinent Vitals/Pain Pain Assessment: No/denies pain    Home Living Family/patient expects to be discharged to:: Private residence Living Arrangements: Spouse/significant other Available Help at Discharge: Family;Available 24 hours/day           Home Equipment: None      Prior Function Level of Independence: Independent         Comments: walked 2 miles/day PTA     Hand Dominance        Extremity/Trunk Assessment   Upper Extremity Assessment: Overall WFL for tasks assessed           Lower Extremity Assessment: Overall WFL for tasks assessed      Cervical / Trunk Assessment: Other exceptions (forward head, neck rotation and extension AROM limited 40% by pain)  Communication   Communication: No difficulties  Cognition Arousal/Alertness: Awake/alert Behavior During Therapy: WFL for tasks assessed/performed Overall Cognitive Status: Within Functional Limits for tasks assessed                      General Comments      Exercises Other Exercises Other Exercises: neck lateral flexion stretch with 30 sec hold Other Exercises: isometric neck strengthening -lateral flexion, forward flexion, extension 5 sec hold Other Exercises: shoulder rolls in sitting x 10 AROM   Assessment/Plan    PT Assessment Patient needs continued  PT services  PT Problem List Decreased range of motion;Pain          PT Treatment Interventions Therapeutic exercise    PT Goals (Current goals can be found in the Care Plan section)  Acute Rehab PT Goals Patient Stated Goal: to be able to turn his head when driving, strengthen and reduce pain in neck PT Goal Formulation: With patient/family Time For Goal Achievement: 09/30/16 Potential to Achieve Goals: Good    Frequency Min 3X/week   Barriers to discharge        Co-evaluation               End of Session   Activity Tolerance: Patient tolerated treatment well Patient left: in  chair;with call bell/phone within reach;with family/visitor present Nurse Communication: Mobility status         Time: [id number] PT Time Calculation (min) (ACUTE ONLY): 24 min   Charges:   PT Evaluation $PT Eval Low Complexity: 1 Procedure PT Treatments $Therapeutic Exercise: 8-22 mins   PT G Codes:        Philomena Doheny 09/16/2016, 11:47 AM [phone]

## 2016-09-16 NOTE — Consult Note (Addendum)
[facility]       Reason for Consult: MSSA bacteremia    Referring Physician: Dr. 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 sodium chloride  250 mL Intravenous Once  . acetaminophen  650 mg Oral Once  . calcium carbonate  1 tablet Oral QHS  .  ceFAZolin (ANCEF) IV  2 g Intravenous Q8H  . chlorhexidine  5 mL Mouth/Throat BID  . diphenhydrAMINE  25 mg Oral Once  . irbesartan  37.5 mg Oral Daily  . multivitamin with minerals  1 tablet Oral Daily  . pantoprazole  40 mg Oral Daily  . polyethylene glycol  17 g Oral Daily  . posaconazole  300 mg Oral QHS  . valACYclovir  500 mg Oral BID    Recommendations: Cefazolin Repeat blood cultures tomorrow I have asked cardiology to do a TEE, this may need to be coordinated after bone marrow biopsy on Monday by IR picc when blood cultures negative at 72 hours I have ordered an xray of ankle to be sure no osteomyelitis Flu negative so I have canceled droplet precautions I have cancelled respiratory panel  Dr. Tommy Medal will follow over the weekend  Assessment: He has neutropenia fever with 2/2 blood cultures positive for MSSA.  No hypoxia, CXR without significant findings, so I have canceled the respiratory panel with above source  Antibiotics: Vancomycin and cefepime  HPI: Warren [name] is a 75 y.o. male with MDS followed by hematology here and at [facility] with consideration of stem cell transplant who presents with fever and chills that started on 10/18.  He was recently hospitalized with pulmonary edema and edema, sob and has been on lasix and discuarged the day of admission.  He was not having fever or chills at that time. Blood cultures sent and now positive 2/2 with MSSA by [facility]. No lines, no port.  Recent cellulitis of  left ankle and opening with now a scar and was on Keflex for this.  This has healed.  No further ankle pain.  Has chronic neck pain but nothing new.  CT from previous hospitalization independently reviewed and noted pulmonary edema, emphysema.    Review of Systems:  Constitutional: positive for fevers and chills or negative for fatigue Gastrointestinal: negative for diarrhea Musculoskeletal: positive for left knee pain without swelling, no warmth, negative for back pain All other systems reviewed and are negative   Past Medical History:  Diagnosis Date  . Cancer [facility])    Prostate, Melanoma- lt. Shoulder (no further problems since 2010  . CHF (congestive heart failure) ([place]) 09/13/2016  . Complication of anesthesia    versed gave the adverse reaction pt. ,became aggitated  . COPD (chronic obstructive pulmonary disease) (HCC)    Dr. Annamaria Boots follows  . Diffuse esophageal spasm   . Diverticulosis   . DJD (degenerative joint disease)   . Multiple trauma     horse accident 2003 L SHOULDER  . Oral herpes   . OSA (obstructive sleep apnea)    mild, rare cpap use  . Peripheral neuropathy ([place])   . Pulmonary nodule   . Spinal stenosis   . Systolic hypertension     Social History  Substance Use Topics  . Smoking status: Former Smoker    Packs/day: 1.00    Years:  40.00    Quit date: 11/29/1995  . Smokeless tobacco: Never Used  . Alcohol use Yes     Comment: 1 drink per day    Family History  Problem Relation Age of Onset  . Emphysema Father   . Asthma Father   . Lung cancer Father   . Breast cancer Sister   . Breast cancer Sister   . Skin cancer Brother   . Prostate cancer Brother     Allergies  Allergen Reactions  . Fluconazole     Rash   . Midazolam Other (See Comments)    Agitated caused by versed    Physical Exam: Constitutional: in no apparent distress and alert  Vitals:   09/16/16 0837 09/16/16 0900  BP: 124/64 121/61  Pulse: 88 82  Resp: 16 16  Temp: 97.7 F  (36.5 C) 97.2 F (36.2 C)   EYES: anicteric ENMT: no thrush Cardiovascular: Cor RRR and No murmurs Respiratory: CTA B; normal respiratory effort GI: Bowel sounds are normal, liver is not enlarged, spleen is not enlarged Musculoskeletal: no pedal edema noted; no knee effusion or warmth Skin: negatives: no rash; petechiae/purpura on arms Hematologic: no cervical lad  Lab Results  Component Value Date   WBC 0.5 (LL) 09/15/2016   HGB 8.1 (L) 09/15/2016   HCT 27.8 (L) 09/15/2016   MCV 99.6 09/15/2016   PLT 90 (L) 09/15/2016    Lab Results  Component Value Date   CREATININE 1.22 09/15/2016   BUN 21 (H) 09/15/2016   NA 135 09/15/2016   K 3.5 09/15/2016   CL 104 09/15/2016   CO2 25 09/15/2016    Lab Results  Component Value Date   ALT 30 09/15/2016   AST 38 09/15/2016   ALKPHOS 45 09/15/2016     Microbiology: Recent Results (from the past 240 hour(s))  Blood Culture (routine x 2)     Status: Abnormal (Preliminary result)   Collection Time: 09/14/16 11:42 PM  Result Value Ref Range Status   Specimen Description BLOOD RIGHT ANTECUBITAL  Final   Special Requests BOTTLES DRAWN AEROBIC AND ANAEROBIC 5ML EA  Final   Culture  Setup Time   Final    IN BOTH AEROBIC AND ANAEROBIC BOTTLES GRAM POSITIVE COCCI CRITICAL RESULT CALLED TO, READ BACK BY AND VERIFIED WITH: T. GREEN, PHARMD ([facility]) AT 2149 ON 09/15/16 BY C. JESSUP, MLT. Performed at [place] (A)  Final   Report Status PENDING  Incomplete  Blood Culture ID Panel (Reflexed)     Status: Abnormal   Collection Time: 09/14/16 11:42 PM  Result Value Ref Range Status   Enterococcus species NOT DETECTED NOT DETECTED Final   Listeria monocytogenes NOT DETECTED NOT DETECTED Final   Staphylococcus species DETECTED (A) NOT DETECTED Final    Comment: CRITICAL RESULT CALLED TO, READ BACK BY AND VERIFIED WITH: T. GREEN, PHARMD ([facility]) AT 2149 ON 09/15/16 BY C. JESSUP, MLT.    Staphylococcus  aureus DETECTED (A) NOT DETECTED Final    Comment: CRITICAL RESULT CALLED TO, READ BACK BY AND VERIFIED WITH: T. GREEN, PHARMD ([facility]) AT 2149 ON 09/15/16 BY C. JESSUP, MLT.    Methicillin resistance NOT DETECTED NOT DETECTED Final   Streptococcus species NOT DETECTED NOT DETECTED Final   Streptococcus agalactiae NOT DETECTED NOT DETECTED Final   Streptococcus pneumoniae NOT DETECTED NOT DETECTED Final   Streptococcus pyogenes NOT DETECTED NOT DETECTED Final   Acinetobacter baumannii NOT DETECTED NOT DETECTED Final  Enterobacteriaceae species NOT DETECTED NOT DETECTED Final   Enterobacter cloacae complex NOT DETECTED NOT DETECTED Final   Escherichia coli NOT DETECTED NOT DETECTED Final   Klebsiella oxytoca NOT DETECTED NOT DETECTED Final   Klebsiella pneumoniae NOT DETECTED NOT DETECTED Final   Proteus species NOT DETECTED NOT DETECTED Final   Serratia marcescens NOT DETECTED NOT DETECTED Final   Haemophilus influenzae NOT DETECTED NOT DETECTED Final   Neisseria meningitidis NOT DETECTED NOT DETECTED Final   Pseudomonas aeruginosa NOT DETECTED NOT DETECTED Final   Candida albicans NOT DETECTED NOT DETECTED Final   Candida glabrata NOT DETECTED NOT DETECTED Final   Candida krusei NOT DETECTED NOT DETECTED Final   Candida parapsilosis NOT DETECTED NOT DETECTED Final   Candida tropicalis NOT DETECTED NOT DETECTED Final    Comment: Performed at [facility]  Blood Culture (routine x 2)     Status: None (Preliminary result)   Collection Time: 09/14/16 11:58 PM  Result Value Ref Range Status   Specimen Description BLOOD BLOOD RIGHT HAND  Final   Special Requests IN PEDIATRIC BOTTLE 2ML  Final   Culture  Setup Time   Final    GRAM POSITIVE COCCI IN CLUSTERS AEROBIC BOTTLE ONLY CRITICAL VALUE NOTED.  VALUE IS CONSISTENT WITH PREVIOUSLY REPORTED AND CALLED VALUE.    Culture   Final    GRAM POSITIVE COCCI CULTURE REINCUBATED FOR BETTER GROWTH Performed at [facility]     Report Status PENDING  Incomplete  Urine culture     Status: None   Collection Time: 09/15/16 12:01 AM  Result Value Ref Range Status   Specimen Description URINE, RANDOM  Final   Special Requests NONE  Final   Culture   Final    NO GROWTH 1 DAY Performed at [facility]    Report Status 09/16/2016 FINAL  Final    Scharlene Gloss, [place] for Infectious Disease [facility] [email or web address] [phone] pager  [phone] cell 09/16/2016, 2:05 PM

## 2016-09-16 NOTE — Progress Notes (Addendum)
[facility] PROGRESS NOTE  Warren [name] [id number] DOB: 1941/04/15 DOA: 09/14/2016  PCP: Wenda Low, MD  Brief History/Interval Summary: Warren [name] is a 76 y.o. male with MDS who was admitted recently for shortness of breath and respiratory failure with fluid overload suspected to be secondary to chemotherapy. Patient was discharged home on 10/18 and then subsequently developed fever and chills at home and so he came back to the hospital. Patient found to have neutropenia. He was hospitalized for further management.  Reason for Visit: Neutropenic fever  Consultants: Oncology. Infectious disease  Procedures: None  Antibiotics: Vancomycin, cefepime Tamiflu initiated 10/19. Stopped on 10/20.  Subjective/Interval History: Patient feels better this morning. Denies any shortness of breath. No chest pain at this time. Cough has improved.    ROS: Denies any nausea, vomiting, abdominal pain, diarrhea. Denies any discomfort with urination.  Objective:  Vital Signs  Vitals:   09/15/16 1304 09/15/16 1858 09/15/16 2202 09/16/16 0608  BP: (!) 141/57  (!) 122/59 129/60  Pulse: 85  73 89  Resp: 20  18 18   Temp: 98.8 F (37.1 C) (!) 102.1 F (38.9 C) 98.8 F (37.1 C) (!) 101.6 F (38.7 C)  TempSrc: Oral Oral Oral Oral  SpO2: 97%  97% 92%  Weight:      Height:        Intake/Output Summary (Last 24 hours) at 09/16/16 [id number] Last data filed at 09/16/16 0100  Gross per 24 hour  Intake              770 ml  Output                2 ml  Net              768 ml   Filed Weights   09/14/16 2254 09/15/16 0230  Weight: 76.2 kg (168 lb) 73.9 kg (163 lb)    General appearance: alert, cooperative, appears stated age and no distress Resp: Normal effort today. Lungs are clear to auscultation bilaterally with no wheezing, rales or rhonchi.  Cardio: regular rate and rhythm, S1, S2 normal, no murmur, click, rub or gallop GI: soft, non-tender; bowel sounds normal; no masses,  no  organomegaly Extremities: extremities normal, atraumatic, no cyanosis or edema Neurologic: Awake and alert. Oriented 3. Cranial nerves II-12 intact. Motor strength is equal bilateral upper and lower extremities.  Lab Results:  Data Reviewed: I have personally reviewed following labs and imaging studies  CBC:  Recent Labs Lab 09/12/16 1046 09/13/16 0315 09/14/16 2338 09/15/16 0700  WBC 1.2* 1.5* 0.6* 0.5*  NEUTROABS 0.1* 0.1* 0.1* 0.1*  HGB 7.9* 7.8* 8.5* 8.1*  HCT 27.3* 27.0* 29.3* 27.8*  MCV 97.8 98.5 99.0 99.6  PLT 124* 134* 142* 90*    Basic Metabolic Panel:  Recent Labs Lab 09/12/16 1046 09/13/16 0315 09/14/16 0458 09/14/16 2338 09/15/16 0700  NA 142  --  142 139 135  K 3.9  --  3.5 3.6 3.5  CL  --   --  109 105 104  CO2 24  --  28 27 25   GLUCOSE 118  --  97 100* 104*  BUN 20.3  --  16 21* 21*  CREATININE 1.1 1.05 1.10 1.21 1.22  CALCIUM 8.4  --  8.2* 8.3* 8.2*    GFR: Estimated Creatinine Clearance: 47.4 mL/min (by C-G formula based on SCr of 1.22 mg/dL).  Liver Function Tests:  Recent Labs Lab 09/12/16 1046 09/14/16 2338 09/15/16 0700  AST 38* 40  38  ALT 31 34 30  ALKPHOS 59 54 45  BILITOT 1.37* 1.9* 2.0*  PROT 6.0* 6.8 5.9*  ALBUMIN 3.2* 4.2 3.5    Cardiac Enzymes:  Recent Labs Lab 09/13/16 0315 09/13/16 1000 09/13/16 1601  TROPONINI <0.03 <0.03 <0.03    Thyroid Function Tests:  Recent Labs  09/14/16 0827  TSH 0.848    Radiology Studies: Dg Chest Port 1 View  Result Date: 09/15/2016 CLINICAL DATA:  75 year old male with fever. History of prostate cancer and melanoma with chemotherapy. EXAM: PORTABLE CHEST 1 VIEW COMPARISON:  Chest CT dated 09/12/2016 FINDINGS: Single-view of the chest demonstrate mild diffuse interstitial prominence. There is no focal consolidation, pleural effusion, or pneumothorax. The cardiac silhouette is within normal limits. No acute osseous pathology. IMPRESSION: No acute cardiopulmonary process.  Electronically Signed   By: Anner Crete M.D.   On: 09/15/2016 00:42     Medications:  Scheduled: . sodium chloride  250 mL Intravenous Once  . acetaminophen  650 mg Oral Once  . calcium carbonate  1 tablet Oral QHS  . ceFEPime (MAXIPIME) IV  2 g Intravenous Q8H  . chlorhexidine  5 mL Mouth/Throat BID  . diphenhydrAMINE  25 mg Oral Once  . enoxaparin (LOVENOX) injection  40 mg Subcutaneous Q24H  . irbesartan  37.5 mg Oral Daily  . multivitamin with minerals  1 tablet Oral Daily  . pantoprazole  40 mg Oral Daily  . polyethylene glycol  17 g Oral Daily  . posaconazole  300 mg Oral QHS  . valACYclovir  500 mg Oral BID  . 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 Appears to be staph aureus. No clear evidence for sepsis. Infectious disease has been consulted. Source of this infection is not entirely clear, although patient did have cellulitis involving his left ankle area recently. Patient was initially started on cefepime and vancomycin added yesterday. Defer further antibiotics per infectious disease. Patient was also started on Tamiflu, which can be discontinued. Influenza PCR is negative. Continue patient's home dose of posaconazole and valacyclovir.   Myelodysplastic syndrome with pancytopenia Being followed by Dr. Irene Limbo and also at [facility] for possible stem cell transplant. Continue to monitor CBC. No role for Neupogen at  this time due to presence of blasts. Blood transfusion ordered by his hematologist. Platelet counts noted to be trending down. Await results from today, CBC. Hold his Lovenox.  Recently admitted for fluid overload Most likely from chemotherapy related. Echocardiogram showed preserved LV function with no diastolic dysfunction. He appears to be quite euvolemic at this time. Okay to stop his Lasix for now.  Dyspnea in the setting of known COPD Improved. Continue to monitor. Dyspnea could have been due to fever. Influenza PCR is negative. He did have a CT scan of his chest recently which was negative for PE. Continue to monitor for now. No concerning findings noted on the EKG.  DVT Prophylaxis: Change Lovenox to SCDs due to dropping platelets. Code Status: Full code  Family Communication: Discussed with the patient. Discussed with the son at bedside. Son  is a pulmonologist. Disposition Plan: Continue management as outlined above.    LOS: 1 day   [facility] Pager [phone] 09/16/2016, 8:22 AM  If 7PM-7AM, please contact night-coverage at [email or web address], password [facility]

## 2016-09-16 NOTE — Progress Notes (Signed)
[name]   HEMATOLOGY/ONCOLOGY INPATIENT PROGRESS NOTE  Date of Service: 09/16/2016  Inpatient Attending: .Bonnielee Haff, MD   SUBJECTIVE  Warren [name] was admitted for neutropenic fever after recently being in the hospital for what was thought to be mild fluid overload. He notes a stuffy nose and a little cough which he reports his wife had as well. He has been pan cultured and started on broad spectrum antibiotics pending culture results. He notes some symptoms from his anemia and an informed consent was obtained to proceed with PRBC transfusion. No other overt new focal symptoms.  OBJECTIVE:  NAD  PHYSICAL EXAMINATION: . Vitals:   09/16/16 0837 09/16/16 0900 09/16/16 1155 09/16/16 1410  BP: 124/64 121/61 124/68 125/61  Pulse: 88 82 88 99  Resp: 16 16 16 18   Temp: 97.7 F (36.5 C) 97.2 F (36.2 C) 98.2 F (36.8 C) 98.8 F (37.1 C)  TempSrc: Oral Oral Oral Oral  SpO2: 92% 98% 94% 98%  Weight:      Height:       Filed Weights   09/14/16 2254 09/15/16 0230  Weight: 168 lb (76.2 kg) 163 lb (73.9 kg)   .Body mass index is 25.53 kg/m.  GENERAL:alert, appears a little pale and tired SKIN: no acute new rashes EYES: normal, conjunctiva are pink and non-injected, sclera clear OROPHARYNX:no exudate, no erythema and lips, buccal mucosa, and tongue normal  NECK: supple, no JVD, thyroid normal size, non-tender, without nodularity LYMPH:  no palpable lymphadenopathy in the cervical, axillary or inguinal LUNGS: clear to auscultation with normal respiratory effort HEART: regular rate & rhythm,  no murmurs and no lower extremity edema ABDOMEN: abdomen soft, non-tender, normoactive bowel sounds  Musculoskeletal: no cyanosis of digits and no clubbing  PSYCH: alert & oriented x 3 with fluent speech NEURO: no focal motor/sensory deficits  MEDICAL HISTORY:  Past Medical History:  Diagnosis Date  . Cancer [facility])    Prostate, Melanoma- lt. Shoulder (no further problems since 2010  . CHF  (congestive heart failure) ([place]) 09/13/2016  . Complication of anesthesia    versed gave the adverse reaction pt. ,became aggitated  . COPD (chronic obstructive pulmonary disease) ([facility])    Dr. Annamaria Boots follows  . Diffuse esophageal spasm   . Diverticulosis   . DJD (degenerative joint disease)   . Multiple trauma     horse accident 2003 L SHOULDER  . Oral herpes   . OSA (obstructive sleep apnea)    mild, rare cpap use  . Peripheral neuropathy ([place])   . Pulmonary nodule   . Spinal stenosis   . Systolic hypertension     SURGICAL HISTORY: Past Surgical History:  Procedure Laterality Date  . ,laceration to head in accident with hourse    . ACOUSTIC NEUROMA LEFT EAR  1999  . BALLOON DILATION N/A 11/25/2014   Procedure: BALLOON DILATION;  Surgeon: Garlan Fair, MD;  Location: [facility];  Service: Endoscopy;  Laterality: N/A;  . COLONOSCOPY WITH PROPOFOL N/A 10/01/2013   Procedure: COLONOSCOPY WITH PROPOFOL;  Surgeon: Garlan Fair, MD;  Location: [facility];  Service: Endoscopy;  Laterality: N/A;  . ESOPHAGOGASTRODUODENOSCOPY (EGD) WITH PROPOFOL N/A 11/25/2014   Procedure: ESOPHAGOGASTRODUODENOSCOPY (EGD) WITH PROPOFOL;  Surgeon: Garlan Fair, MD;  Location: [facility] ENDOSCOPY;  Service: Endoscopy;  Laterality: N/A;  . EYE SURGERY     cosmetic surgery"brow lift" 4'15  . MULTIPLE PROCEDURES FOR LEFT ARM     ELBOW,RIB FX PNEUMOTHORAX AFTER FALLING OFF MOUNTAIN  . PILONIDAL CYST /  SINUS EXCISION    . RADICAL PROSTATECTOMY      SOCIAL HISTORY: Social History   Social History  . Marital status: Married    Spouse name: N/A  . Number of children: N/A  . Years of education: N/A   Occupational History  . Not on file.   Social History Main Topics  . Smoking status: Former Smoker    Packs/day: 1.00    Years: 40.00    Quit date: 11/29/1995  . Smokeless tobacco: Never Used  . Alcohol use Yes     Comment: 1 drink per day  . Drug use: No  . Sexual activity: Not on file    Other Topics Concern  . Not on file   Social History Narrative   CPAP 8 advanced   Patient drinks 5 cups of caffeine daily   Exercises 3-4 times weekly   Son is a pulmonologist    FAMILY HISTORY: Family History  Problem Relation Age of Onset  . Emphysema Father   . Asthma Father   . Lung cancer Father   . Breast cancer Sister   . Breast cancer Sister   . Skin cancer Brother   . Prostate cancer Brother     ALLERGIES:  is allergic to fluconazole and midazolam.  MEDICATIONS:  Scheduled Meds: . sodium chloride  250 mL Intravenous Once  . sodium chloride   Intravenous Once  . acetaminophen  650 mg Oral Once  . calcium carbonate  1 tablet Oral QHS  .  ceFAZolin (ANCEF) IV  2 g Intravenous Q8H  . chlorhexidine  5 mL Mouth/Throat BID  . diphenhydrAMINE  25 mg Oral Once  . irbesartan  37.5 mg Oral Daily  . multivitamin with minerals  1 tablet Oral Daily  . pantoprazole  40 mg Oral Daily  . polyethylene glycol  17 g Oral Daily  . posaconazole  300 mg Oral QHS  . valACYclovir  500 mg Oral BID   Continuous Infusions:  PRN Meds:.acetaminophen **OR** acetaminophen, heparin lock flush, heparin lock flush, magic mouthwash, nitroGLYCERIN, ondansetron **OR** ondansetron (ZOFRAN) IV, ondansetron, senna-docusate, sodium chloride flush, sodium chloride flush  REVIEW OF SYSTEMS:    10 Point review of Systems was done is negative except as noted above.   LABORATORY DATA:  I have reviewed the data as listed  . CBC Latest Ref Rng & Units 09/15/2016 09/14/2016 09/13/2016  WBC 4.0 - 10.5 K/uL 0.5(LL) 0.6(LL) 1.5(L)  Hemoglobin 13.0 - 17.0 g/dL 8.1(L) 8.5(L) 7.8(L)  Hematocrit 39.0 - 52.0 % 27.8(L) 29.3(L) 27.0(L)  Platelets 150 - 400 K/uL 90(L) 142(L) 134(L)    . CMP Latest Ref Rng & Units 09/15/2016 09/14/2016 09/14/2016  Glucose 65 - 99 mg/dL 104(H) 100(H) 97  BUN 6 - 20 mg/dL 21(H) 21(H) 16  Creatinine 0.61 - 1.24 mg/dL 1.22 1.21 1.10  Sodium 135 - 145 mmol/L 135 139 142   Potassium 3.5 - 5.1 mmol/L 3.5 3.6 3.5  Chloride 101 - 111 mmol/L 104 105 109  CO2 22 - 32 mmol/L 25 27 28   Calcium 8.9 - 10.3 mg/dL 8.2(L) 8.3(L) 8.2(L)  Total Protein 6.5 - 8.1 g/dL 5.9(L) 6.8 -  Total Bilirubin 0.3 - 1.2 mg/dL 2.0(H) 1.9(H) -  Alkaline Phos 38 - 126 U/L 45 54 -  AST 15 - 41 U/L 38 40 -  ALT 17 - 63 U/L 30 34 -     RADIOGRAPHIC STUDIES: I have personally reviewed the radiological images as listed and agreed with the findings in  the report. Dg Chest 2 View  Result Date: 09/12/2016 CLINICAL DATA:  Neutropenic patient with dyspnea and ankle swelling. Patient on chemotherapy. History of prostate cancer. EXAM: CHEST  2 VIEW COMPARISON:  07/17/2016 chest radiograph obtained 02/28/2013 chest CT. FINDINGS: The heart size and mediastinal contours are within normal limits. Both lungs are clear. Surgical screw is seen over the left humeral head. Stable T6 through T8 mild compression deformities since 2014 comparison accounting for mild mid thoracic kyphosis. No apparent blastic disease. IMPRESSION: No active cardiopulmonary disease. Electronically Signed   By: Ashley Royalty M.D.   On: 09/12/2016 18:02   Dg Ankle Complete Left  Result Date: 09/16/2016 CLINICAL DATA:  Cellulitis. EXAM: LEFT ANKLE COMPLETE - 3+ VIEW COMPARISON:  No recent prior. FINDINGS: No acute bony or joint abnormality. No evidence of fracture dislocation. Peripheral vascular calcification. IMPRESSION: No acute bony abnormality.  Peripheral vascular disease. Electronically Signed   By: Marcello Moores  Register   On: 09/16/2016 12:32   Ct Angio Chest Pe W And/or Wo Contrast  Result Date: 09/13/2016 CLINICAL DATA:  Initial evaluation for acute shortness of breath. History of prostate cancer. EXAM: CT ANGIOGRAPHY CHEST WITH CONTRAST TECHNIQUE: Multidetector CT imaging of the chest was performed using the standard protocol during bolus administration of intravenous contrast. Multiplanar CT image reconstructions and MIPs were  obtained to evaluate the vascular anatomy. CONTRAST:  100 cc of Isovue 370. COMPARISON:  Prior radiograph from earlier the same day. FINDINGS: Cardiovascular: Intrathoracic aorta of normal caliber without evidence for aneurysm or other acute abnormality. Visualized great vessels within normal limits. Mild cardiomegaly present. Scattered coronary artery calcifications noted. Small pericardial fusion. Pulmonary arterial tree adequately opacified for evaluation. Main pulmonary artery measures at the upper limits of normal for size at 3 cm in diameter. No filling defect to suggest acute pulmonary embolism. Re-formatted imaging confirms these findings. Mediastinum/Nodes: Thyroid normal. No pathologically enlarged mediastinal, hilar, or axillary lymph nodes identified. Esophagus within normal limits. Lungs/Pleura: Small layering bilateral pleural effusions present. Mild scattered interlobular septal thickening within the lungs, suggesting mild pulmonary interstitial edema. Underlying emphysema. No focal infiltrates. No pneumothorax. No worrisome pulmonary nodule or mass. Few scattered granulomas noted within the left lung base. Upper Abdomen: 1.9 cm hypodense lesion within the liver compatible with a cyst. Additional sub cm hypodensity within the right hepatic lobe noted, too small the characterize, but may reflect a small cyst as well. Probable small cyst noted within the right kidney. Remainder the visualized upper abdomen otherwise unremarkable. Musculoskeletal: No acute osseous abnormality. No worrisome lytic or blastic osseous lesion. Scattered mild degenerative changes within the mid thoracic spine. Review of the MIP images confirms the above findings. IMPRESSION: 1. No CT evidence for acute pulmonary embolism. 2. Mild diffuse interlobular septal thickening, suggestive of mild pulmonary interstitial edema. 3. Small layering bilateral pleural effusions. 4. Mild cardiomegaly with associated small pericardial fusion.  5. Emphysema. Electronically Signed   By: Jeannine Boga M.D.   On: 09/13/2016 00:02   Dg Chest Port 1 View  Result Date: 09/15/2016 CLINICAL DATA:  75 year old male with fever. History of prostate cancer and melanoma with chemotherapy. EXAM: PORTABLE CHEST 1 VIEW COMPARISON:  Chest CT dated 09/12/2016 FINDINGS: Single-view of the chest demonstrate mild diffuse interstitial prominence. There is no focal consolidation, pleural effusion, or pneumothorax. The cardiac silhouette is within normal limits. No acute osseous pathology. IMPRESSION: No acute cardiopulmonary process. Electronically Signed   By: Anner Crete M.D.   On: 09/15/2016 00:42    ASSESSMENT &  PLAN:   75 year old Caucasian male with  1) Myelodysplastic syndrome (RAEB-2 with about 14-15% blasts) ISS-R score of 7 (Very High Risk) Cytogenetics show trisomy 8 (intermediate risk ) Presented with Subacute Anemia (with high normal MCV) + leukopenia/Neutropenia - new since September 2016. Patient has normal platelet counts at this time. Foundation one results reviewed patient has TET2, ASXL1, BCOR RUNX1 SETBP1 SRSF2 and STAG2 mutations identified. Repeat after his bone marrow suggested possibly some decrease in his blasts counts 7% by flow cytometry. 2) Febrile neutropenia. Patient has had persistent neutropenia for several months and was on prophylactic Cipro /Posaconazole and Acyclovir. No clear source. 3) symptomatic anemia hgb 8.1 Plan -pan-cultured -empiric broad spectrum antibiotic coverage for neutropenic fever with cefepime/vancomycin - narrow based on culture results -continue prophylactic acyclovir and posaconazole -flu swab and empiric tamiflu pending results -consider ID consultation -will order 1 unit of irradiated CMV neg PRBC's (use only irradiation and CMV neg products) -he is due to get his followup CT guided bone marrow examination early next week  4) Slightly elevated bilirubin level even before  treatment (?GIlbert's) - stable, slightly higher in the setting of infection.  I spent 30 minutes counseling the patient face to face. The total time spent in the appointment was 35 minutes and more than 50% was on counseling and direct patient cares.    Sullivan Lone MD [place] AAHIVMS [facility] Hematology/Oncology Physician [facility]  (Office):       [phone] (Work cell):  [phone] (Fax):           [phone]

## 2016-09-16 NOTE — Progress Notes (Signed)
Pharmacy Antibiotic Note  Warren [name] is a 75 y.o. male admitted on 09/14/2016 with bacteremia.  Pharmacy has been consulted for cefazolin dosing.  Plan: Cefazolin 2gm IV q8h Follow renal function and clinical course   Height: 5\' 7"  (170.2 cm) Weight: 163 lb (73.9 kg) IBW/kg (Calculated) : 66.1  Temp (24hrs), Avg:99.4 F (37.4 C), Min:97.2 F (36.2 C), Max:102.1 F (38.9 C)   Recent Labs Lab 09/12/16 1046 09/13/16 0315 09/14/16 0458 09/14/16 2338 09/14/16 2350 09/15/16 0700  WBC 1.2* 1.5*  --  0.6*  --  0.5*  CREATININE 1.1 1.05 1.10 1.21  --  1.22  LATICACIDVEN  --   --   --   --  1.52 1.0    Estimated Creatinine Clearance: 47.4 mL/min (by C-G formula based on SCr of 1.22 mg/dL).    Allergies  Allergen Reactions  . Fluconazole     Rash   . Midazolam Other (See Comments)    Agitated caused by versed    Antimicrobials this admission: Resumed Valtrex & Posaconazole PTA 10/19 cefepime >> 10/20 10/19 vanco >> 10/20 10/19 Oseltamvir >> 10/20 10/20 cefazolin >>  Microbiology results: 10/18 BCx: 1/2 cx Staph aureus (MSSA) 10/19UCx: NGF  Thank you for allowing pharmacy to be a part of this patient's care.  Dolly Rias RPh 09/16/2016, 12:20 PM Pager [phone]

## 2016-09-17 ENCOUNTER — Inpatient Hospital Stay (HOSPITAL_COMMUNITY): Payer: Medicare Other

## 2016-09-17 ENCOUNTER — Encounter (HOSPITAL_COMMUNITY): Payer: Self-pay | Admitting: General Surgery

## 2016-09-17 DIAGNOSIS — J449 Chronic obstructive pulmonary disease, unspecified: Secondary | ICD-10-CM

## 2016-09-17 DIAGNOSIS — R350 Frequency of micturition: Secondary | ICD-10-CM

## 2016-09-17 DIAGNOSIS — R3589 Other polyuria: Secondary | ICD-10-CM

## 2016-09-17 DIAGNOSIS — R358 Other polyuria: Secondary | ICD-10-CM

## 2016-09-17 DIAGNOSIS — G8929 Other chronic pain: Secondary | ICD-10-CM

## 2016-09-17 DIAGNOSIS — A4101 Sepsis due to Methicillin susceptible Staphylococcus aureus: Secondary | ICD-10-CM

## 2016-09-17 DIAGNOSIS — R8290 Unspecified abnormal findings in urine: Secondary | ICD-10-CM

## 2016-09-17 DIAGNOSIS — R6520 Severe sepsis without septic shock: Secondary | ICD-10-CM

## 2016-09-17 DIAGNOSIS — M25572 Pain in left ankle and joints of left foot: Secondary | ICD-10-CM

## 2016-09-17 LAB — BASIC METABOLIC PANEL
Anion gap: 6 (ref 5–15)
BUN: 21 mg/dL — AB (ref 6–20)
CALCIUM: 8 mg/dL — AB (ref 8.9–10.3)
CO2: 27 mmol/L (ref 22–32)
CREATININE: 1.1 mg/dL (ref 0.61–1.24)
Chloride: 104 mmol/L (ref 101–111)
GFR calc Af Amer: >60 mL/min (ref >60)
GLUCOSE: 106 mg/dL — AB (ref 65–99)
POTASSIUM: 3.7 mmol/L (ref 3.5–5.1)
SODIUM: 137 mmol/L (ref 135–145)

## 2016-09-17 LAB — CULTURE, BLOOD (ROUTINE X 2)

## 2016-09-17 LAB — CBC
HCT: 29.6 % — ABNORMAL LOW (ref 39.0–52.0)
Hemoglobin: 8.8 g/dL — ABNORMAL LOW (ref 13.0–17.0)
MCH: 28.7 pg (ref 26.0–34.0)
MCHC: 29.7 g/dL — AB (ref 30.0–36.0)
MCV: 96.4 fL (ref 78.0–100.0)
PLATELETS: 82 10*3/uL — AB (ref 150–400)
RBC: 3.07 MIL/uL — ABNORMAL LOW (ref 4.22–5.81)
RDW: 21.9 % — AB (ref 11.5–15.5)
WBC: 1.8 10*3/uL — ABNORMAL LOW (ref 4.0–10.5)

## 2016-09-17 LAB — BRAIN NATRIURETIC PEPTIDE: B NATRIURETIC PEPTIDE 5: 37.1 pg/mL (ref 0.0–100.0)

## 2016-09-17 MED ORDER — ALBUTEROL SULFATE (2.5 MG/3ML) 0.083% IN NEBU
2.5000 mg | INHALATION_SOLUTION | Freq: Four times a day (QID) | RESPIRATORY_TRACT | Status: DC | PRN
  Filled 2016-09-17: qty 3

## 2016-09-17 NOTE — Progress Notes (Signed)
[name]   HEMATOLOGY/ONCOLOGY INPATIENT PROGRESS NOTE  Date of Service: 09/16/2016  Inpatient Attending: .Bonnielee Haff, MD   SUBJECTIVE  [name] notes he is feeling a little better. his fever curves are downtrending . Blood cultures grew out MSSA . Patient has been switched to cefazolin as per ID . Cardiac echocardiogram TTE. Was done and showed no overt evidence of infective endocarditis. Consideration is on for TEE. X-ray of the left ankle showed no evidence of osteomyelitis at the area of his previous laceration.  OBJECTIVE:  NAD  PHYSICAL EXAMINATION: . Vitals:   09/16/16 1600 09/16/16 1733 09/16/16 2116 09/17/16 0457  BP:   117/82 130/61  Pulse:   83 77  Resp:   16 15  Temp: (!) 100.4 F (38 C) (!) 100.5 F (38.1 C) 99.7 F (37.6 C) 98.8 F (37.1 C)  TempSrc: Oral Oral Oral Oral  SpO2:   94% 100%  Weight:      Height:       Filed Weights   09/14/16 2254 09/15/16 0230  Weight: 168 lb (76.2 kg) 163 lb (73.9 kg)   .Body mass index is 25.53 kg/m.  GENERAL:alert, appears brighter today SKIN: no acute new rashes EYES: normal, conjunctiva are pink and non-injected, sclera clear OROPHARYNX:no exudate, no erythema and lips, buccal mucosa, and tongue normal  NECK: supple, no JVD, thyroid normal size, non-tender, without nodularity LYMPH:  no palpable lymphadenopathy in the cervical, axillary or inguinal LUNGS: clear to auscultation with normal respiratory effort HEART: regular rate & rhythm,  no murmurs and no lower extremity edema ABDOMEN: abdomen soft, non-tender, normoactive bowel sounds  Musculoskeletal: no cyanosis of digits and no clubbing  PSYCH: alert & oriented x 3 with fluent speech NEURO: no focal motor/sensory deficits   MEDICAL HISTORY:  Past Medical History:  Diagnosis Date  . Cancer [facility])    Prostate, Melanoma- lt. Shoulder (no further problems since 2010  . CHF (congestive heart failure) ([place]) 09/13/2016  . Complication of anesthesia    versed gave  the adverse reaction pt. ,became aggitated  . COPD (chronic obstructive pulmonary disease) ([facility])    Dr. Annamaria Boots follows  . Diffuse esophageal spasm   . Diverticulosis   . DJD (degenerative joint disease)   . Multiple trauma     horse accident 2003 L SHOULDER  . Oral herpes   . OSA (obstructive sleep apnea)    mild, rare cpap use  . Peripheral neuropathy ([place])   . Pulmonary nodule   . Spinal stenosis   . Systolic hypertension     SURGICAL HISTORY: Past Surgical History:  Procedure Laterality Date  . ,laceration to head in accident with hourse    . ACOUSTIC NEUROMA LEFT EAR  1999  . BALLOON DILATION N/A 11/25/2014   Procedure: BALLOON DILATION;  Surgeon: Garlan Fair, MD;  Location: [facility];  Service: Endoscopy;  Laterality: N/A;  . COLONOSCOPY WITH PROPOFOL N/A 10/01/2013   Procedure: COLONOSCOPY WITH PROPOFOL;  Surgeon: Garlan Fair, MD;  Location: [facility];  Service: Endoscopy;  Laterality: N/A;  . ESOPHAGOGASTRODUODENOSCOPY (EGD) WITH PROPOFOL N/A 11/25/2014   Procedure: ESOPHAGOGASTRODUODENOSCOPY (EGD) WITH PROPOFOL;  Surgeon: Garlan Fair, MD;  Location: [facility];  Service: Endoscopy;  Laterality: N/A;  . EYE SURGERY     cosmetic surgery"brow lift" 4'15  . MULTIPLE PROCEDURES FOR LEFT ARM     ELBOW,RIB FX PNEUMOTHORAX AFTER FALLING OFF MOUNTAIN  . PILONIDAL CYST / SINUS EXCISION    . RADICAL PROSTATECTOMY  SOCIAL HISTORY: Social History   Social History  . Marital status: Married    Spouse name: N/A  . Number of children: N/A  . Years of education: N/A   Occupational History  . Not on file.   Social History Main Topics  . Smoking status: Former Smoker    Packs/day: 1.00    Years: 40.00    Quit date: 11/29/1995  . Smokeless tobacco: Never Used  . Alcohol use Yes     Comment: 1 drink per day  . Drug use: No  . Sexual activity: Not on file   Other Topics Concern  . Not on file   Social History Narrative   CPAP 8 advanced    Patient drinks 5 cups of caffeine daily   Exercises 3-4 times weekly   Son is a pulmonologist    FAMILY HISTORY: Family History  Problem Relation Age of Onset  . Emphysema Father   . Asthma Father   . Lung cancer Father   . Breast cancer Sister   . Breast cancer Sister   . Skin cancer Brother   . Prostate cancer Brother     ALLERGIES:  is allergic to fluconazole and midazolam.  MEDICATIONS:  Scheduled Meds: . sodium chloride  250 mL Intravenous Once  . sodium chloride   Intravenous Once  . acetaminophen  650 mg Oral Once  . calcium carbonate  1 tablet Oral QHS  .  ceFAZolin (ANCEF) IV  2 g Intravenous Q8H  . chlorhexidine  5 mL Mouth/Throat BID  . diphenhydrAMINE  25 mg Oral Once  . irbesartan  37.5 mg Oral Daily  . multivitamin with minerals  1 tablet Oral Daily  . pantoprazole  40 mg Oral Daily  . polyethylene glycol  17 g Oral Daily  . posaconazole  300 mg Oral QHS  . valACYclovir  500 mg Oral BID   Continuous Infusions:  PRN Meds:.acetaminophen **OR** acetaminophen, heparin lock flush, heparin lock flush, magic mouthwash, nitroGLYCERIN, ondansetron **OR** ondansetron (ZOFRAN) IV, ondansetron, senna-docusate, sodium chloride flush, sodium chloride flush  REVIEW OF SYSTEMS:    10 Point review of Systems was done is negative except as noted above.   LABORATORY DATA:  I have reviewed the data as listed  . CBC Latest Ref Rng & Units 09/16/2016 09/15/2016  WBC 4.0 - 10.5 K/uL 1.2(LL) 0.5(LL)  Hemoglobin 13.0 - 17.0 g/dL 8.9(L) 8.1(L)  Hematocrit 39.0 - 52.0 % 30.0(L) 27.8(L)  Platelets 150 - 400 K/uL 112(L) 90(L)    . CMP Latest Ref Rng & Units 09/15/2016 09/14/2016  Glucose 65 - 99 mg/dL 104(H) 100(H)  BUN 6 - 20 mg/dL 21(H) 21(H)  Creatinine 0.61 - 1.24 mg/dL 1.22 1.21  Sodium 135 - 145 mmol/L 135 139  Potassium 3.5 - 5.1 mmol/L 3.5 3.6  Chloride 101 - 111 mmol/L 104 105  CO2 22 - 32 mmol/L 25 27  Calcium 8.9 - 10.3 mg/dL 8.2(L) 8.3(L)  Total Protein  6.5 - 8.1 g/dL 5.9(L) 6.8  Total Bilirubin 0.3 - 1.2 mg/dL 2.0(H) 1.9(H)  Alkaline Phos 38 - 126 U/L 45 54  AST 15 - 41 U/L 38 40  ALT 17 - 63 U/L 30 34     RADIOGRAPHIC STUDIES: I have personally reviewed the radiological images as listed and agreed with the findings in the report. Dg Chest 2 View  Result Date: 09/12/2016 CLINICAL DATA:  Neutropenic patient with dyspnea and ankle swelling. Patient on chemotherapy. History of prostate cancer. EXAM: CHEST  2 VIEW  COMPARISON:  07/17/2016 chest radiograph obtained 02/28/2013 chest CT. FINDINGS: The heart size and mediastinal contours are within normal limits. Both lungs are clear. Surgical screw is seen over the left humeral head. Stable T6 through T8 mild compression deformities since 2014 comparison accounting for mild mid thoracic kyphosis. No apparent blastic disease. IMPRESSION: No active cardiopulmonary disease. Electronically Signed   By: Ashley Royalty M.D.   On: 09/12/2016 18:02   Dg Ankle Complete Left  Result Date: 09/16/2016 CLINICAL DATA:  Cellulitis. EXAM: LEFT ANKLE COMPLETE - 3+ VIEW COMPARISON:  No recent prior. FINDINGS: No acute bony or joint abnormality. No evidence of fracture dislocation. Peripheral vascular calcification. IMPRESSION: No acute bony abnormality.  Peripheral vascular disease. Electronically Signed   By: Marcello Moores  Register   On: 09/16/2016 12:32   Ct Angio Chest Pe W And/or Wo Contrast  Result Date: 09/13/2016 CLINICAL DATA:  Initial evaluation for acute shortness of breath. History of prostate cancer. EXAM: CT ANGIOGRAPHY CHEST WITH CONTRAST TECHNIQUE: Multidetector CT imaging of the chest was performed using the standard protocol during bolus administration of intravenous contrast. Multiplanar CT image reconstructions and MIPs were obtained to evaluate the vascular anatomy. CONTRAST:  100 cc of Isovue 370. COMPARISON:  Prior radiograph from earlier the same day. FINDINGS: Cardiovascular: Intrathoracic aorta of  normal caliber without evidence for aneurysm or other acute abnormality. Visualized great vessels within normal limits. Mild cardiomegaly present. Scattered coronary artery calcifications noted. Small pericardial fusion. Pulmonary arterial tree adequately opacified for evaluation. Main pulmonary artery measures at the upper limits of normal for size at 3 cm in diameter. No filling defect to suggest acute pulmonary embolism. Re-formatted imaging confirms these findings. Mediastinum/Nodes: Thyroid normal. No pathologically enlarged mediastinal, hilar, or axillary lymph nodes identified. Esophagus within normal limits. Lungs/Pleura: Small layering bilateral pleural effusions present. Mild scattered interlobular septal thickening within the lungs, suggesting mild pulmonary interstitial edema. Underlying emphysema. No focal infiltrates. No pneumothorax. No worrisome pulmonary nodule or mass. Few scattered granulomas noted within the left lung base. Upper Abdomen: 1.9 cm hypodense lesion within the liver compatible with a cyst. Additional sub cm hypodensity within the right hepatic lobe noted, too small the characterize, but may reflect a small cyst as well. Probable small cyst noted within the right kidney. Remainder the visualized upper abdomen otherwise unremarkable. Musculoskeletal: No acute osseous abnormality. No worrisome lytic or blastic osseous lesion. Scattered mild degenerative changes within the mid thoracic spine. Review of the MIP images confirms the above findings. IMPRESSION: 1. No CT evidence for acute pulmonary embolism. 2. Mild diffuse interlobular septal thickening, suggestive of mild pulmonary interstitial edema. 3. Small layering bilateral pleural effusions. 4. Mild cardiomegaly with associated small pericardial fusion. 5. Emphysema. Electronically Signed   By: Jeannine Boga M.D.   On: 09/13/2016 00:02   Dg Chest Port 1 View  Result Date: 09/15/2016 CLINICAL DATA:  75 year old male with  fever. History of prostate cancer and melanoma with chemotherapy. EXAM: PORTABLE CHEST 1 VIEW COMPARISON:  Chest CT dated 09/12/2016 FINDINGS: Single-view of the chest demonstrate mild diffuse interstitial prominence. There is no focal consolidation, pleural effusion, or pneumothorax. The cardiac silhouette is within normal limits. No acute osseous pathology. IMPRESSION: No acute cardiopulmonary process. Electronically Signed   By: Anner Crete M.D.   On: 09/15/2016 00:42   Transthoracic Echocardiography  Patient:    Radley, Barto MR #:       [id number] Study Date: 09/13/2016 Gender:     M Age:  75 Height:     170.2 cm Weight:     75.4 kg BSA:        1.9 m^2 Pt. Status: Room:       1441   ADMITTING    Tyrone Apple N  SONOGRAPHER  Tresa Res, [place], [place]  PERFORMING   [facility], Inpatient  cc:  ------------------------------------------------------------------- LV EF: 60% -   65%  ------------------------------------------------------------------- Indications:      CHF - 428.0.  ------------------------------------------------------------------- History:   PMH:   Congestive heart failure.  Chronic obstructive pulmonary disease.  Risk factors:  Former tobacco use. Hypertension.  ------------------------------------------------------------------- Study Conclusions  - Left ventricle: The cavity size was normal. Systolic function was   normal. The estimated ejection fraction was in the range of 60%   to 65%. Wall motion was normal; there were no regional wall   motion abnormalities. Left ventricular diastolic function   parameters were normal. - Aortic valve: Trileaflet; mildly thickened, mildly calcified   leaflets. There was trivial regurgitation. - Mitral valve: There was trivial regurgitation. - Left atrium: The atrium was mildly dilated. - Pulmonary arteries: PA peak pressure: 38 mm Hg  (S). - Pericardium, extracardiac: A trivial, free-flowing pericardial   effusion was identified along the right ventricular free wall.   The fluid had no internal echoes.  Impressions:  - The right ventricular systolic pressure was increased consistent   with mild pulmonary hypertension.  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 Patient has normal platelet counts at this time. Foundation one results reviewed patient has TET2, ASXL1, BCOR RUNX1 SETBP1 SRSF2 and STAG2 mutations identified. Repeat after his bone marrow suggested possibly some decrease in his blasts counts 7% by flow cytometry. 2) Febrile neutropenia. Patient has had persistent neutropenia for several months and was on prophylactic Ciprofloxacin/Posaconazole and Acyclovir. Blood cultures note MSSA bacteremia. TTE Negative for endocarditis . Xray of left ankle neg for osteomyelitis. 3) symptomatic anemia . Hemoglobin has improved 8.9 after transfusion of 1 unit of PRBC. 4) thrombocytopenia - platelet counts lower than baseline due to MSSA sepsis and possibly vancomycin ... Now switched to cefazolin . Plan - appreciated  ID input - Antibiotics narrowed down to IV cefazolin for MSSA bacteremia  -per ID with f/u Blcx -plan for TEE per ID -he is due for rpt CT guided bone marrow biopsy on Monday -transfuse irradiated CMV neg PRBC's (use only irradiated and CMV neg products) as needed for hgb<8 or if symptomatic  5) Slightly elevated bilirubin level even before treatment (?GIlbert's) - stable, slightly higher in the setting of infection.  Appreciate excellent hospitalist cares and infectious disease input .  I spent 30 minutes counseling the patient face to  face. The total time spent in the appointment was 35 minutes and more than 50% was on counseling and direct patient cares.    Sullivan Lone MD [place] AAHIVMS [facility] Hematology/Oncology Physician [facility]  (Office):       [phone] (Work cell):  [phone] (Fax):           [phone]

## 2016-09-17 NOTE — Progress Notes (Signed)
Subjective:  He has had some darker urine and urge to urinate but not dysuria   Antibiotics:  Anti-infectives    Start     Dose/Rate Route Frequency Ordered Stop   09/16/16 1300  ceFAZolin (ANCEF) IVPB 2g/100 mL premix     2 g 200 mL/hr over 30 Minutes Intravenous Every 8 hours 09/16/16 1213     09/15/16 2200  posaconazole (NOXAFIL) delayed-release tablet 300 mg     300 mg Oral Daily at bedtime 09/15/16 0513     09/15/16 1200  oseltamivir (TAMIFLU) capsule 30 mg  Status:  Discontinued     30 mg Oral 2 times daily 09/15/16 1039 09/16/16 0822   09/15/16 1200  vancomycin (VANCOCIN) IVPB 750 mg/150 ml premix  Status:  Discontinued     750 mg 150 mL/hr over 60 Minutes Intravenous Every 12 hours 09/15/16 1056 09/16/16 1159   09/15/16 1000  valACYclovir (VALTREX) tablet 500 mg    Comments:  At night to suppress herpes gingivitis     500 mg Oral 2 times daily 09/15/16 0513     09/15/16 0800  ceFEPIme (MAXIPIME) 2 g in dextrose 5 % 50 mL IVPB  Status:  Discontinued     2 g 100 mL/hr over 30 Minutes Intravenous Every 8 hours 09/15/16 0027 09/16/16 1159   09/15/16 0000  ceFEPIme (MAXIPIME) 2 g in dextrose 5 % 50 mL IVPB     2 g 100 mL/hr over 30 Minutes Intravenous STAT 09/14/16 2358 09/15/16 0036      Medications: Scheduled Meds: . sodium chloride  250 mL Intravenous Once  . sodium chloride   Intravenous Once  . acetaminophen  650 mg Oral Once  . calcium carbonate  1 tablet Oral QHS  .  ceFAZolin (ANCEF) IV  2 g Intravenous Q8H  . chlorhexidine  5 mL Mouth/Throat BID  . diphenhydrAMINE  25 mg Oral Once  . irbesartan  37.5 mg Oral Daily  . multivitamin with minerals  1 tablet Oral Daily  . pantoprazole  40 mg Oral Daily  . polyethylene glycol  17 g Oral Daily  . posaconazole  300 mg Oral QHS  . valACYclovir  500 mg Oral BID   Continuous Infusions:  PRN Meds:.acetaminophen **OR** acetaminophen, albuterol, heparin lock flush, heparin lock flush, magic mouthwash,  nitroGLYCERIN, ondansetron **OR** ondansetron (ZOFRAN) IV, ondansetron, senna-docusate, sodium chloride flush, sodium chloride flush    Objective: Weight change:   Intake/Output Summary (Last 24 hours) at 09/17/16 2015 Last data filed at 09/17/16 1500  Gross per 24 hour  Intake              100 ml  Output                4 ml  Net               96 ml   Blood pressure (!) 127/58, pulse 83, temperature 99.3 F (37.4 C), resp. rate 16, height '5\' 7"'$  (1.702 m), weight 163 lb (73.9 kg), SpO2 99 %. Temp:  [98.8 F (37.1 C)-100 F (37.8 C)] 99.3 F (37.4 C) (10/21 1750) Pulse Rate:  [77-83] 83 (10/21 1447) Resp:  [15-16] 16 (10/21 1447) BP: (117-133)/(58-82) 127/58 (10/21 1447) SpO2:  [94 %-100 %] 99 % (10/21 1447)  Physical Exam: General: Alert and awake, oriented x3, not in any acute distress. HEENT: anicteric sclera, pupils reactive to light and accommodation, EOMI CVS regular rate Chest:  no wheezing resp  distress Abdomen: softnondistended Extremities: ankle nontender. Scab from prior cut and cellulitis episode Skin: no rashes Neuro: nonfocal  CBC:  CBC Latest Ref Rng & Units 09/17/2016 09/16/2016 09/15/2016  WBC 4.0 - 10.5 K/uL 1.8(L) 1.2(LL) 0.5(LL)  Hemoglobin 13.0 - 17.0 g/dL 9.7(N) 8.9(L) 8.1(L)  Hematocrit 39.0 - 52.0 % 29.6(L) 30.0(L) 27.8(L)  Platelets 150 - 400 K/uL 82(L) 112(L) 90(L)      BMET  Recent Labs  09/15/16 0700 09/17/16 0453  NA 135 137  K 3.5 3.7  CL 104 104  CO2 25 27  GLUCOSE 104* 106*  BUN 21* 21*  CREATININE 1.22 1.10  CALCIUM 8.2* 8.0*     Liver Panel   Recent Labs  09/14/16 2338 09/15/16 0700  PROT 6.8 5.9*  ALBUMIN 4.2 3.5  AST 40 38  ALT 34 30  ALKPHOS 54 45  BILITOT 1.9* 2.0*       Sedimentation Rate No results for input(s): ESRSEDRATE in the last 72 hours. C-Reactive Protein No results for input(s): CRP in the last 72 hours.  Micro Results: Recent Results (from the past 720 hour(s))  Blood Culture  (routine x 2)     Status: Abnormal   Collection Time: 09/14/16 11:42 PM  Result Value Ref Range Status   Specimen Description BLOOD RIGHT ANTECUBITAL  Final   Special Requests BOTTLES DRAWN AEROBIC AND ANAEROBIC EA  Final   Culture  Setup Time   Final    IN BOTH AEROBIC AND ANAEROBIC BOTTLES GRAM POSITIVE COCCI CRITICAL RESULT CALLED TO, READ BACK BY AND VERIFIED WITH: T. GREEN, PHARMD ([facility]) AT 2149 ON 09/15/16 BY C. JESSUP, MLT. Performed at [facility]    Culture STAPHYLOCOCCUS AUREUS (A)  Final   Report Status 09/17/2016 FINAL  Final   Organism ID, Bacteria STAPHYLOCOCCUS AUREUS  Final      Susceptibility   Staphylococcus aureus - MIC*    CIPROFLOXACIN >=8 RESISTANT Resistant     ERYTHROMYCIN >=8 RESISTANT Resistant     GENTAMICIN <=0.5 SENSITIVE Sensitive     OXACILLIN 0.5 SENSITIVE Sensitive     TETRACYCLINE <=1 SENSITIVE Sensitive     VANCOMYCIN 1 SENSITIVE Sensitive     TRIMETH/SULFA <=10 SENSITIVE Sensitive     CLINDAMYCIN <=0.25 SENSITIVE Sensitive     RIFAMPIN <=0.5 SENSITIVE Sensitive     Inducible Clindamycin NEGATIVE Sensitive     * STAPHYLOCOCCUS AUREUS  Blood Culture ID Panel (Reflexed)     Status: Abnormal   Collection Time: 09/14/16 11:42 PM  Result Value Ref Range Status   Enterococcus species NOT DETECTED NOT DETECTED Final   Listeria monocytogenes NOT DETECTED NOT DETECTED Final   Staphylococcus species DETECTED (A) NOT DETECTED Final    Comment: CRITICAL RESULT CALLED TO, READ BACK BY AND VERIFIED WITH: T. GREEN, PHARMD ([facility]) AT 2149 ON 09/15/16 BY C. JESSUP, MLT.    Staphylococcus aureus DETECTED (A) NOT DETECTED Final    Comment: CRITICAL RESULT CALLED TO, READ BACK BY AND VERIFIED WITH: T. GREEN, PHARMD ([facility]) AT 2149 ON 09/15/16 BY C. JESSUP, MLT.    Methicillin resistance NOT DETECTED NOT DETECTED Final   Streptococcus species NOT DETECTED NOT DETECTED Final   Streptococcus agalactiae NOT DETECTED NOT DETECTED Final   Streptococcus  pneumoniae NOT DETECTED NOT DETECTED Final   Streptococcus pyogenes NOT DETECTED NOT DETECTED Final   Acinetobacter baumannii NOT DETECTED NOT DETECTED Final   Enterobacteriaceae species NOT DETECTED NOT DETECTED Final   Enterobacter cloacae complex NOT DETECTED NOT DETECTED Final  Escherichia coli NOT DETECTED NOT DETECTED Final   Klebsiella oxytoca NOT DETECTED NOT DETECTED Final   Klebsiella pneumoniae NOT DETECTED NOT DETECTED Final   Proteus species NOT DETECTED NOT DETECTED Final   Serratia marcescens NOT DETECTED NOT DETECTED Final   Haemophilus influenzae NOT DETECTED NOT DETECTED Final   Neisseria meningitidis NOT DETECTED NOT DETECTED Final   Pseudomonas aeruginosa NOT DETECTED NOT DETECTED Final   Candida albicans NOT DETECTED NOT DETECTED Final   Candida glabrata NOT DETECTED NOT DETECTED Final   Candida krusei NOT DETECTED NOT DETECTED Final   Candida parapsilosis NOT DETECTED NOT DETECTED Final   Candida tropicalis NOT DETECTED NOT DETECTED Final    Comment: Performed at [facility]  Blood Culture (routine x 2)     Status: Abnormal   Collection Time: 09/14/16 11:58 PM  Result Value Ref Range Status   Specimen Description BLOOD BLOOD RIGHT HAND  Final   Special Requests IN PEDIATRIC BOTTLE 2ML  Final   Culture  Setup Time   Final    GRAM POSITIVE COCCI IN CLUSTERS AEROBIC BOTTLE ONLY CRITICAL VALUE NOTED.  VALUE IS CONSISTENT WITH PREVIOUSLY REPORTED AND CALLED VALUE.    Culture (A)  Final    STAPHYLOCOCCUS AUREUS SUSCEPTIBILITIES PERFORMED ON PREVIOUS CULTURE WITHIN THE LAST 5 DAYS. Performed at [facility]    Report Status 09/17/2016 FINAL  Final  Urine culture     Status: None   Collection Time: 09/15/16 12:01 AM  Result Value Ref Range Status   Specimen Description URINE, RANDOM  Final   Special Requests NONE  Final   Culture   Final    NO GROWTH 1 DAY Performed at [facility]    Report Status 09/16/2016 FINAL  Final     Studies/Results: Dg Chest 2 View  Result Date: 09/17/2016 CLINICAL DATA:  Cough and shortness of Breath EXAM: CHEST  2 VIEW COMPARISON:  09/14/2016 FINDINGS: Cardiac shadow is within normal limits. Aortic calcifications are again seen. Mild increased vascular congestion with interstitial edema is noted. Some patchy changes are noted in the right lung base which were not well seen on the prior exam consistent with atelectasis. IMPRESSION: Mild increase in vascular congestion with interstitial edema. Mild right basilar atelectasis. Electronically Signed   By: Inez Catalina M.D.   On: 09/17/2016 09:04   Dg Ankle Complete Left  Result Date: 09/16/2016 CLINICAL DATA:  Cellulitis. EXAM: LEFT ANKLE COMPLETE - 3+ VIEW COMPARISON:  No recent prior. FINDINGS: No acute bony or joint abnormality. No evidence of fracture dislocation. Peripheral vascular calcification. IMPRESSION: No acute bony abnormality.  Peripheral vascular disease. 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 male with  MDS followed by hematology here and [facility] with chronic suppression of cell lines and profound neutropenia admitted with MSSA bacteremia with sepsis likely due to seeding of blood from episode of cellulitis after cut several weeks ago       [place] Ant[facility]icrobial Management Team Staphylococcus aureus bacteremia   Staphylococcus aureus bacteremia (SAB) is associated with a high rate of complications and mortality.  Specific aspects of clinical management are critical  to optimizing the outcome of patients with SAB.  Therefore, the [facility] Antimicrobial Management Team  [facility]) has initiated an intervention aimed at improving the management of SAB at [facility].  To do so, Infectious Diseases physicians are providing an evidence-based consult for the management of all patients with SAB.     Yes No Comments  Perform follow-up blood cultures (even if the patient is afebrile) to ensure clearance of bacteremia '[x]'$  '[]'$  Taken today  Remove vascular catheter and obtain follow-up blood cultures after the removal of the catheter '[]'$  '[]'$  DO NOT PLACE PICC UNTIL TODAYS CULTURES ARE NO GROWTH AT 4-5 DAYS  Perform echocardiography to evaluate for endocarditis (transthoracic ECHO is 40-50% sensitive, TEE is > 90% sensitive) '[]'$  '[]'$  Please keep in mind, that neither test can definitively EXCLUDE endocarditis, and that should clinical suspicion remain high for endocarditis the patient should then still be treated with an "endocarditis" duration of therapy = 6 weeks  TEE is NEEDED   Consult electrophysiologist to evaluate implanted cardiac device (pacemaker, ICD) '[]'$  '[]'$  NA  Ensure source control '[]'$  '[]'$  Have all abscesses been drained effectively? Have deep seeded infections (septic joints or osteomyelitis) had appropriate surgical debridement?  Ankle was likely source.  I will get MRI of ankle to ensure no deep infection  Investigate for "metastatic" sites of infection '[]'$  '[]'$  Does the patient have ANY symptom or physical exam finding that would suggest a deeper infection (back or neck pain that may be suggestive of vertebral osteomyelitis or epidural abscess, muscle pain that could be a symptom of pyomyositis)?  Keep in mind that for deep seeded infections MRI imaging with contrast is preferred rather than other often insensitive tests such as plain x-rays, especially early in a patient's presentation.  Change antibiotic therapy to cefazolin '[]'$  '[]'$  Beta-lactam antibiotics are preferred for MSSA due to higher cure rates.   If on Vancomycin, goal trough should be 15 - 20 mcg/mL    Estimated duration of IV antibiotic therapy: I would advocate for 4 weeks even if we exclude endocarditis by TEE.  This patient does not have a straightforward removal source such as an PICC to blame his bacteremia and he is immunocompromised and we will at most prove  Clearance of bactermia 3 days into hospital rather than more rapidly '[]'$  '[]'$  Consult case management for probably prolonged outpatient IV antibiotic therapy   #2 Darker urine with increased frequency: I am not concerned for UTI at this point  #3 MDS: plan is for bone marrow biopsy. IF his repeat blood cultures are NOT showing clearance I would NOT recommend proceeding with the BM bx  #4 COPD: son asked if I could order PRN albuterol for Dad which I did.    LOS: 2 days   Alcide Evener 09/17/2016, 8:15 PM

## 2016-09-17 NOTE — Consult Note (Signed)
Chief Complaint: Myelodysplastic syndrome  Referring Physician:Dr. Sullivan Lone  Supervising Physician: Daryll Brod  Patient Status: [facility] - In-pt  HPI: Warren [name] is an 75 y.o. male with a history of myelodysplastic syndrome. He is followed by Dr. Irene Limbo. He was admitted earlier last week secondary to swelling of his ankles and shortness of breath with findings concerning for fluid overload. He was discharged home but then returned 4 hours later on the same day secondary to right gutters and chills at home. Upon admission he was noted to have fevers as high as 103. He has been found to have bacteremia with positive blood cultures for staph aureus. The patient is scheduled Monday as an outpatient for a repeat bone marrow biopsy. We have been asked to go ahead and proceed with this bone marrow biopsy while he is still here.  Past Medical History:  Past Medical History:  Diagnosis Date  . Cancer [facility])    Prostate, Melanoma- lt. Shoulder (no further problems since 2010  . CHF (congestive heart failure) ([place]) 09/13/2016  . Complication of anesthesia    versed gave the adverse reaction pt. ,became aggitated  . COPD (chronic obstructive pulmonary disease) (HCC)    Dr. Annamaria Boots follows  . Diffuse esophageal spasm   . Diverticulosis   . DJD (degenerative joint disease)   . Multiple trauma     horse accident 2003 L SHOULDER  . Oral herpes   . OSA (obstructive sleep apnea)    mild, rare cpap use  . Peripheral neuropathy ([place])   . Pulmonary nodule   . Spinal stenosis   . Systolic hypertension     Past Surgical History:  Past Surgical History:  Procedure Laterality Date  . ,laceration to head in accident with hourse    . ACOUSTIC NEUROMA LEFT EAR  1999  . BALLOON DILATION N/A 11/25/2014   Procedure: BALLOON DILATION;  Surgeon: Garlan Fair, MD;  Location: [facility];  Service: Endoscopy;  Laterality: N/A;  . COLONOSCOPY WITH PROPOFOL N/A 10/01/2013   Procedure: COLONOSCOPY WITH  PROPOFOL;  Surgeon: Garlan Fair, MD;  Location: [facility];  Service: Endoscopy;  Laterality: N/A;  . ESOPHAGOGASTRODUODENOSCOPY (EGD) WITH PROPOFOL N/A 11/25/2014   Procedure: ESOPHAGOGASTRODUODENOSCOPY (EGD) WITH PROPOFOL;  Surgeon: Garlan Fair, MD;  Location: [facility];  Service: Endoscopy;  Laterality: N/A;  . EYE SURGERY     cosmetic surgery"brow lift" 4'15  . MULTIPLE PROCEDURES FOR LEFT ARM     ELBOW,RIB FX PNEUMOTHORAX AFTER FALLING OFF MOUNTAIN  . PILONIDAL CYST / SINUS EXCISION    . RADICAL PROSTATECTOMY      Family History:  Family History  Problem Relation Age of Onset  . Emphysema Father   . Asthma Father   . Lung cancer Father   . Breast cancer Sister   . Breast cancer Sister   . Skin cancer Brother   . Prostate cancer Brother     Social History:  reports that he quit smoking about 20 years ago. He has a 40.00 pack-year smoking history. He has never used smokeless tobacco. He reports that he drinks alcohol. He reports that he does not use drugs.  Allergies:  Allergies  Allergen Reactions  . Fluconazole     Rash   . Midazolam Other (See Comments)    Agitated caused by versed    Medications: Medications reviewed in Epic.  Please HPI for pertinent positives, otherwise complete 10 system ROS negative.  Mallampati Score: MD Evaluation Airway: WNL Heart:  WNL Abdomen: WNL Chest/ Lungs: WNL ASA  Classification: 3 Mallampati/Airway Score: Two  Physical Exam: BP 133/65 (BP Location: Left Arm)   Pulse 77   Temp 98.8 F (37.1 C) (Oral)   Resp 16   Ht '5\' 7"'$  (1.702 m)   Wt 163 lb (73.9 kg)   SpO2 97%   BMI 25.53 kg/m  Body mass index is 25.53 kg/m. General: pleasant, WD, WN white male who is laying in bed in NAD HEENT: head is normocephalic, atraumatic.  Sclera are noninjected.  PERRL.  Ears and nose without any masses or lesions.  Mouth is pink and moist Heart: regular, rate, and rhythm.  Normal s1,s2. No obvious murmurs, gallops, or rubs  noted.  Palpable radial and pedal pulses bilaterally Lungs: CTAB, no wheezes, rhonchi, or rales noted.  Respiratory effort nonlabored Abd: soft, NT, ND, +BS, no masses, hernias, or organomegaly MS: all 4 extremities are symmetrical with no cyanosis, clubbing, but edema in his lower extremities is present. Psych: A&Ox3 with an appropriate affect.   Labs: Results for orders placed or performed during the hospital encounter of 09/14/16 (from the past 48 hour(s))  Prepare RBC     Status: None   Collection Time: 09/15/16  4:30 PM  Result Value Ref Range   Order Confirmation ORDER PROCESSED BY BLOOD BANK   Type and screen     Status: None (Preliminary result)   Collection Time: 09/15/16  7:20 PM  Result Value Ref Range   ABO/RH(D) O NEG    Antibody Screen NEG    Sample Expiration 09/18/2016    Unit Number [id number]    Blood Component Type RCLI PHER 1    Unit division 00    Status of Unit ISSUED    Transfusion Status OK TO TRANSFUSE    Crossmatch Result Compatible   CBC     Status: Abnormal   Collection Time: 09/16/16  4:22 PM  Result Value Ref Range   WBC 1.2 (LL) 4.0 - 10.5 K/uL    Comment: CRITICAL VALUE NOTED.  VALUE IS CONSISTENT WITH PREVIOUSLY REPORTED AND CALLED VALUE.   RBC 3.09 (L) 4.22 - 5.81 MIL/uL   Hemoglobin 8.9 (L) 13.0 - 17.0 g/dL   HCT 30.0 (L) 39.0 - 52.0 %   MCV 97.1 78.0 - 100.0 fL   MCH 28.8 26.0 - 34.0 pg   MCHC 29.7 (L) 30.0 - 36.0 g/dL   RDW 21.8 (H) 11.5 - 15.5 %   Platelets 112 (L) 150 - 400 K/uL    Comment: CONSISTENT WITH PREVIOUS RESULT  CBC     Status: Abnormal   Collection Time: 09/17/16  4:53 AM  Result Value Ref Range   WBC 1.8 (L) 4.0 - 10.5 K/uL   RBC 3.07 (L) 4.22 - 5.81 MIL/uL   Hemoglobin 8.8 (L) 13.0 - 17.0 g/dL   HCT 29.6 (L) 39.0 - 52.0 %   MCV 96.4 78.0 - 100.0 fL   MCH 28.7 26.0 - 34.0 pg   MCHC 29.7 (L) 30.0 - 36.0 g/dL   RDW 21.9 (H) 11.5 - 15.5 %   Platelets 82 (L) 150 - 400 K/uL    Comment: CONSISTENT WITH PREVIOUS RESULT   Basic metabolic panel     Status: Abnormal   Collection Time: 09/17/16  4:53 AM  Result Value Ref Range   Sodium 137 135 - 145 mmol/L   Potassium 3.7 3.5 - 5.1 mmol/L   Chloride 104 101 - 111 mmol/L   CO2 27 22 -  32 mmol/L   Glucose, Bld 106 (H) 65 - 99 mg/dL   BUN 21 (H) 6 - 20 mg/dL   Creatinine, Ser 1.10 0.61 - 1.24 mg/dL   Calcium 8.0 (L) 8.9 - 10.3 mg/dL   GFR calc non Af Amer >60 >60 mL/min   GFR calc Af Amer >60 >60 mL/min    Comment: (NOTE) The eGFR has been calculated using the CKD EPI equation. This calculation has not been validated in all clinical situations. eGFR's persistently <60 mL/min signify possible Chronic Kidney Disease.    Anion gap 6 5 - 15  Brain natriuretic peptide     Status: None   Collection Time: 09/17/16  5:42 AM  Result Value Ref Range   B Natriuretic Peptide 37.1 0.0 - 100.0 pg/mL    Imaging: Dg Chest 2 View  Result Date: 09/17/2016 CLINICAL DATA:  Cough and shortness of Breath EXAM: CHEST  2 VIEW COMPARISON:  09/14/2016 FINDINGS: Cardiac shadow is within normal limits. Aortic calcifications are again seen. Mild increased vascular congestion with interstitial edema is noted. Some patchy changes are noted in the right lung base which were not well seen on the prior exam consistent with atelectasis. IMPRESSION: Mild increase in vascular congestion with interstitial edema. Mild right basilar atelectasis. Electronically Signed   By: Inez Catalina M.D.   On: 09/17/2016 09:04   Dg Ankle Complete Left  Result Date: 09/16/2016 CLINICAL DATA:  Cellulitis. EXAM: LEFT ANKLE COMPLETE - 3+ VIEW COMPARISON:  No recent prior. FINDINGS: No acute bony or joint abnormality. No evidence of fracture dislocation. Peripheral vascular calcification. IMPRESSION: No acute bony abnormality.  Peripheral vascular disease. Electronically Signed   By: Marcello Moores  Register   On: 09/16/2016 12:32    Assessment/Plan 1. Myelodysplastic syndrome -We will go ahead and proceed with  his bone marrow biopsy while he is here. This has been cleared with the infectious disease doctors, the oncologist, and the interventional radiology physician. The patient seems to be improving and is felt there are no contraindications with proceeding. -Nothing by mouth after midnight on Sunday -Hold blood thinners on Monday although he is currently not on any. -Risks and Benefits discussed with the patient including, but not limited to bleeding, infection, damage to adjacent structures or low yield requiring additional tests. All of the patient's questions were answered, patient is agreeable to proceed. Consent signed and in chart.  Thank you for this interesting consult.  I greatly enjoyed meeting Warren [name] and look forward to participating in their care.  A copy of this report was sent to the requesting provider on this date.  Electronically Signed: Henreitta Cea 09/17/2016, 2:01 PM   I spent a total of  25 minutes in face to face in clinical consultation, greater than 50% of which was counseling/coordinating care for myelodysplastic syndrome

## 2016-09-17 NOTE — Progress Notes (Signed)
[facility] PROGRESS NOTE  Warren [name] [id number] DOB: 17-Jan-1941 DOA: 09/14/2016  PCP: Wenda Low, MD  Brief History/Interval Summary: Warren [name] is a 75 y.o. male with MDS who was admitted recently for shortness of breath and respiratory failure with fluid overload suspected to be secondary to chemotherapy. Patient was discharged home on 10/18 and then subsequently developed fever and chills at home and so he came back to the hospital. Patient found to have neutropenia. He was hospitalized for further management. Blood Cultures returned positive for staph aureus.  Reason for Visit: Neutropenic fever  Consultants: Oncology. Infectious disease  Procedures: None  Antibiotics: Vancomycin, cefepime , stopped on 10/20 Tamiflu initiated 10/19. Stopped on 10/20. Cefazolin 10/20  Subjective/Interval History: Patient feels better, more or less, however, does have occasional cough with sharp chest pains at times. Cough is mainly dry. Denies any difficulty breathing at this time. His son is at the bedside.    ROS: Denies any nausea, vomiting, abdominal pain, diarrhea.   Objective:  Vital Signs  Vitals:   09/16/16 1600 09/16/16 1733 09/16/16 2116 09/17/16 0457  BP:   117/82 130/61  Pulse:   83 77  Resp:   16 15  Temp: (!) 100.4 F (38 C) (!) 100.5 F (38.1 C) 99.7 F (37.6 C) 98.8 F (37.1 C)  TempSrc: Oral Oral Oral Oral  SpO2:   94% 100%  Weight:      Height:        Intake/Output Summary (Last 24 hours) at 09/17/16 0835 Last data filed at 09/17/16 0457  Gross per 24 hour  Intake            717.5 ml  Output                2 ml  Net            715.5 ml   Filed Weights   09/14/16 2254 09/15/16 0230  Weight: 76.2 kg (168 lb) 73.9 kg (163 lb)    General appearance: alert, cooperative, appears stated age and no distress Resp: Lungs show diminished air entry at the bases. No definite crackles, wheezing or rhonchi. Normal effort.  Cardio: regular rate  and rhythm, S1, S2 normal, no murmur, click, rub or gallop GI: soft, non-tender; bowel sounds normal; no masses,  no organomegaly Extremities: Minimal edema lower extremities. Neurologic: Awake and alert. Oriented 3. Cranial nerves II-12 intact. Motor strength is equal bilateral upper and lower extremities.  Lab Results:  Data Reviewed: I have personally reviewed following labs and imaging studies  CBC:  Recent Labs Lab 09/12/16 1046 09/13/16 0315 09/14/16 2338 09/15/16 0700 09/16/16 1622 09/17/16 0453  WBC 1.2* 1.5* 0.6* 0.5* 1.2* 1.8*  NEUTROABS 0.1* 0.1* 0.1* 0.1*  --   --   HGB 7.9* 7.8* 8.5* 8.1* 8.9* 8.8*  HCT 27.3* 27.0* 29.3* 27.8* 30.0* 29.6*  MCV 97.8 98.5 99.0 99.6 97.1 96.4  PLT 124* 134* 142* 90* 112* 82*    Basic Metabolic Panel:  Recent Labs Lab 09/12/16 1046 09/13/16 0315 09/14/16 0458 09/14/16 2338 09/15/16 0700 09/17/16 0453  NA 142  --  142 139 135 137  K 3.9  --  3.5 3.6 3.5 3.7  CL  --   --  109 105 104 104  CO2 24  --  28 27 25 27   GLUCOSE 118  --  97 100* 104* 106*  BUN 20.3  --  16 21* 21* 21*  CREATININE 1.1 1.05 1.10 1.21 1.22 1.10  CALCIUM 8.4  --  8.2* 8.3* 8.2* 8.0*    GFR: Estimated Creatinine Clearance: 52.6 mL/min (by C-G formula based on SCr of 1.1 mg/dL).  Liver Function Tests:  Recent Labs Lab 09/12/16 1046 09/14/16 2338 09/15/16 0700  AST 38* 40 38  ALT 31 34 30  ALKPHOS 59 54 45  BILITOT 1.37* 1.9* 2.0*  PROT 6.0* 6.8 5.9*  ALBUMIN 3.2* 4.2 3.5    Cardiac Enzymes:  Recent Labs Lab 09/13/16 0315 09/13/16 1000 09/13/16 1601  TROPONINI <0.03 <0.03 <0.03    Thyroid Function Tests: No results for input(s): TSH, T4TOTAL, FREET4, T3FREE, THYROIDAB in the last 72 hours.  Results for orders placed or performed during the hospital encounter of 09/14/16  Blood Culture (routine x 2)     Status: Abnormal   Collection Time: 09/14/16 11:42 PM  Result Value Ref Range Status   Specimen Description BLOOD RIGHT  ANTECUBITAL  Final   Special Requests BOTTLES DRAWN AEROBIC AND ANAEROBIC 5ML EA  Final   Culture  Setup Time   Final    IN BOTH AEROBIC AND ANAEROBIC BOTTLES GRAM POSITIVE COCCI CRITICAL RESULT CALLED TO, READ BACK BY AND VERIFIED WITH: T. GREEN, PHARMD ([facility]) AT 2149 ON 09/15/16 BY C. JESSUP, MLT. Performed at [place] (A)  Final   Report Status 09/17/2016 FINAL  Final   Organism ID, Bacteria STAPHYLOCOCCUS AUREUS  Final      Susceptibility   Staphylococcus aureus - MIC*    CIPROFLOXACIN >=8 RESISTANT Resistant     ERYTHROMYCIN >=8 RESISTANT Resistant     GENTAMICIN <=0.5 SENSITIVE Sensitive     OXACILLIN 0.5 SENSITIVE Sensitive     TETRACYCLINE <=1 SENSITIVE Sensitive     VANCOMYCIN 1 SENSITIVE Sensitive     TRIMETH/SULFA <=10 SENSITIVE Sensitive     CLINDAMYCIN <=0.25 SENSITIVE Sensitive     RIFAMPIN <=0.5 SENSITIVE Sensitive     Inducible Clindamycin NEGATIVE Sensitive     * STAPHYLOCOCCUS AUREUS  Blood Culture ID Panel (Reflexed)     Status: Abnormal   Collection Time: 09/14/16 11:42 PM  Result Value Ref Range Status   Enterococcus species NOT DETECTED NOT DETECTED Final   Listeria monocytogenes NOT DETECTED NOT DETECTED Final   Staphylococcus species DETECTED (A) NOT DETECTED Final    Comment: CRITICAL RESULT CALLED TO, READ BACK BY AND VERIFIED WITH: T. GREEN, PHARMD ([facility]) AT 2149 ON 09/15/16 BY C. JESSUP, MLT.    Staphylococcus aureus DETECTED (A) NOT DETECTED Final    Comment: CRITICAL RESULT CALLED TO, READ BACK BY AND VERIFIED WITH: T. GREEN, PHARMD ([facility]) AT 2149 ON 09/15/16 BY C. JESSUP, MLT.    Methicillin resistance NOT DETECTED NOT DETECTED Final   Streptococcus species NOT DETECTED NOT DETECTED Final   Streptococcus agalactiae NOT DETECTED NOT DETECTED Final   Streptococcus pneumoniae NOT DETECTED NOT DETECTED Final   Streptococcus pyogenes NOT DETECTED NOT DETECTED Final   Acinetobacter baumannii NOT DETECTED NOT  DETECTED Final   Enterobacteriaceae species NOT DETECTED NOT DETECTED Final   Enterobacter cloacae complex NOT DETECTED NOT DETECTED Final   Escherichia coli NOT DETECTED NOT DETECTED Final   Klebsiella oxytoca NOT DETECTED NOT DETECTED Final   Klebsiella pneumoniae NOT DETECTED NOT DETECTED Final   Proteus species NOT DETECTED NOT DETECTED Final   Serratia marcescens NOT DETECTED NOT DETECTED Final   Haemophilus influenzae NOT DETECTED NOT DETECTED Final   Neisseria meningitidis NOT DETECTED NOT DETECTED Final   Pseudomonas aeruginosa  NOT DETECTED NOT DETECTED Final   Candida albicans NOT DETECTED NOT DETECTED Final   Candida glabrata NOT DETECTED NOT DETECTED Final   Candida krusei NOT DETECTED NOT DETECTED Final   Candida parapsilosis NOT DETECTED NOT DETECTED Final   Candida tropicalis NOT DETECTED NOT DETECTED Final    Comment: Performed at [facility]  Blood Culture (routine x 2)     Status: Abnormal   Collection Time: 09/14/16 11:58 PM  Result Value Ref Range Status   Specimen Description BLOOD BLOOD RIGHT HAND  Final   Special Requests IN PEDIATRIC BOTTLE 2ML  Final   Culture  Setup Time   Final    GRAM POSITIVE COCCI IN CLUSTERS AEROBIC BOTTLE ONLY CRITICAL VALUE NOTED.  VALUE IS CONSISTENT WITH PREVIOUSLY REPORTED AND CALLED VALUE.    Culture (A)  Final    STAPHYLOCOCCUS AUREUS SUSCEPTIBILITIES PERFORMED ON PREVIOUS CULTURE WITHIN THE LAST 5 DAYS. Performed at [facility]    Report Status 09/17/2016 FINAL  Final  Urine culture     Status: None   Collection Time: 09/15/16 12:01 AM  Result Value Ref Range Status   Specimen Description URINE, RANDOM  Final   Special Requests NONE  Final   Culture   Final    NO GROWTH 1 DAY Performed at [facility]    Report Status 09/16/2016 FINAL  Final     Radiology Studies: Dg Ankle Complete Left  Result Date: 09/16/2016 CLINICAL DATA:  Cellulitis. EXAM: LEFT ANKLE COMPLETE - 3+ VIEW COMPARISON:  No  recent prior. FINDINGS: No acute bony or joint abnormality. No evidence of fracture dislocation. Peripheral vascular calcification. IMPRESSION: No acute bony abnormality.  Peripheral vascular disease. Electronically Signed   By: Marcello Moores  Register   On: 09/16/2016 12:32     Medications:  Scheduled: . sodium chloride  250 mL Intravenous Once  . sodium chloride   Intravenous Once  . acetaminophen  650 mg Oral Once  . calcium carbonate  1 tablet Oral QHS  .  ceFAZolin (ANCEF) IV  2 g Intravenous Q8H  . chlorhexidine  5 mL Mouth/Throat BID  . diphenhydrAMINE  25 mg Oral Once  . irbesartan  37.5 mg Oral Daily  . multivitamin with minerals  1 tablet Oral Daily  . pantoprazole  40 mg Oral Daily  . polyethylene glycol  17 g Oral Daily  . posaconazole  300 mg Oral QHS  . valACYclovir  500 mg Oral BID   Continuous:  [id number] **OR** acetaminophen, heparin lock flush, heparin lock flush, magic mouthwash, nitroGLYCERIN, ondansetron **OR** ondansetron (ZOFRAN) IV, ondansetron, senna-docusate, sodium chloride flush, sodium chloride flush  Assessment/Plan:  Principal Problem:   Neutropenic fever (HCC) Active Problems:   Thrombocytopenia (HCC)   RAEB-2 (refractory anemia with excess blasts-2) (HCC)   MDS (myelodysplastic syndrome) (HCC)   Gram-positive cocci bacteremia    Neutropenic fever/Staphylococcus aureus bacteremia Patient's blood cultures are positive for staph aureus. No clear evidence for sepsis. Infectious disease is following. Cultures have been repeated today. Patient is currently on Ancef. Vancomycin and cefepime were discontinued. Tamiflu was also discontinued. Influenza PCR was negative. Source of this infection is not entirely clear, although patient did have cellulitis involving his left ankle area recently. Continue patient's home dose of posaconazole and valacyclovir.   Myelodysplastic syndrome with pancytopenia Being followed by Dr. Irene Limbo and also at [facility]  for possible stem cell transplant. Continue to monitor CBC. No role for Neupogen at this time due to presence of  blasts. Blood transfusion ordered by his hematologist. Platelet counts noted to be trending down. Platelet counts remain low. No evidence for bleeding. Continue to hold Lovenox.  Recently admitted for fluid overload Most likely from chemotherapy related. Echocardiogram showed preserved LV function with no diastolic dysfunction. He appears to be quite euvolemic at this time. Okay to hold his Lasix for now.  Dyspnea in the setting of known COPD Continues to have on and off cough with what appears to be pleuritic chest pain. Chest x-ray was repeated. Raises concern for mild vascular congestion and interstitial edema. This could be infectious, but cannot rule out volume overload. We will order BNP. May need to give him Lasix but will wait for the results of the BNP to come back. The results were discussed with the patient's son who is a pulmonologist. He agrees with the plan. He will let his father know. Influenza PCR is negative. He did have a CT scan of his chest recently which was negative for PE. Continue to monitor for now. No concerning findings noted on the EKG.  DVT Prophylaxis: Change Lovenox to SCDs due to dropping platelets. Code Status: Full code  Family Communication: Discussed with the patient. Discussed with the son at bedside. Son is a pulmonologist. Disposition Plan: Continue management as outlined above.    LOS: 2 days   [place] [facility] Pager [phone] 09/17/2016, 8:35 AM  If 7PM-7AM, please contact night-coverage at [email or web address], password [facility]

## 2016-09-18 ENCOUNTER — Inpatient Hospital Stay (HOSPITAL_COMMUNITY): Payer: Medicare Other

## 2016-09-18 DIAGNOSIS — R05 Cough: Secondary | ICD-10-CM

## 2016-09-18 DIAGNOSIS — R059 Cough, unspecified: Secondary | ICD-10-CM

## 2016-09-18 LAB — CBC WITH DIFFERENTIAL/PLATELET
BASOS PCT: 0 %
Basophils Absolute: 0 10*3/uL (ref 0.0–0.1)
EOS PCT: 1 %
Eosinophils Absolute: 0 10*3/uL (ref 0.0–0.7)
HEMATOCRIT: 28.7 % — AB (ref 39.0–52.0)
Hemoglobin: 8.6 g/dL — ABNORMAL LOW (ref 13.0–17.0)
LYMPHS ABS: 0.8 10*3/uL (ref 0.7–4.0)
Lymphocytes Relative: 34 %
MCH: 28 pg (ref 26.0–34.0)
MCHC: 30 g/dL (ref 30.0–36.0)
MCV: 93.5 fL (ref 78.0–100.0)
MONO ABS: 0.3 10*3/uL (ref 0.1–1.0)
MONOS PCT: 14 %
NEUTROS ABS: 1.3 10*3/uL — AB (ref 1.7–7.7)
Neutrophils Relative %: 51 %
PLATELETS: 102 10*3/uL — AB (ref 150–400)
RBC: 3.07 MIL/uL — AB (ref 4.22–5.81)
RDW: 21.2 % — AB (ref 11.5–15.5)
WBC: 2.4 10*3/uL — ABNORMAL LOW (ref 4.0–10.5)

## 2016-09-18 LAB — COMPREHENSIVE METABOLIC PANEL
ALT: 32 U/L (ref 17–63)
AST: 59 U/L — AB (ref 15–41)
Albumin: 3.1 g/dL — ABNORMAL LOW (ref 3.5–5.0)
Alkaline Phosphatase: 53 U/L (ref 38–126)
Anion gap: 10 (ref 5–15)
BILIRUBIN TOTAL: 1.6 mg/dL — AB (ref 0.3–1.2)
BUN: 16 mg/dL (ref 6–20)
CHLORIDE: 102 mmol/L (ref 101–111)
CO2: 25 mmol/L (ref 22–32)
CREATININE: 0.99 mg/dL (ref 0.61–1.24)
Calcium: 7.9 mg/dL — ABNORMAL LOW (ref 8.9–10.3)
Glucose, Bld: 119 mg/dL — ABNORMAL HIGH (ref 65–99)
POTASSIUM: 3.2 mmol/L — AB (ref 3.5–5.1)
Sodium: 137 mmol/L (ref 135–145)
TOTAL PROTEIN: 6 g/dL — AB (ref 6.5–8.1)

## 2016-09-18 MED ORDER — MAGNESIUM CITRATE PO SOLN: 1.0000 | Freq: Every day | ORAL | Status: DC | PRN

## 2016-09-18 MED ORDER — GADOBENATE DIMEGLUMINE 529 MG/ML IV SOLN
15.0000 mL | Freq: Once | INTRAVENOUS | Status: AC | PRN
  Administered 2016-09-18: 15 mL via INTRAVENOUS

## 2016-09-18 MED ORDER — POTASSIUM CHLORIDE CRYS ER 20 MEQ PO TBCR
40.0000 meq | EXTENDED_RELEASE_TABLET | Freq: Once | ORAL | Status: AC
  Administered 2016-09-18: 40 meq via ORAL
  Filled 2016-09-18: qty 2

## 2016-09-18 NOTE — Progress Notes (Signed)
Order received verbally from Dr. Tommy Medal for Magnesium citrate Prn per pt request.

## 2016-09-18 NOTE — Progress Notes (Addendum)
[facility] PROGRESS NOTE  Warren [name] DPO:[id number] DOB: 11/26/1941 DOA: 09/14/2016  PCP: Wenda Low, MD  Brief History/Interval Summary: Warren [name] is a 75 y.o. male with MDS who was admitted recently for shortness of breath and respiratory failure with fluid overload suspected to be secondary to chemotherapy. Patient was discharged home on 10/18 and then subsequently developed fever and chills at home and so he came back to the hospital. Patient found to have neutropenia. He was hospitalized for further management. Blood Cultures returned positive for staph aureus.  Reason for Visit: Neutropenic fever  Consultants: Oncology. Infectious disease  Procedures: None  Antibiotics: Vancomycin, cefepime , stopped on 10/20 Tamiflu initiated 10/19. Stopped on 10/20. Cefazolin 10/20  Subjective/Interval History: Patient with intermittent chest pain with a pleuritic component. Some cough as well. Did have some wheezing yesterday, which improved with the nebulizer treatment. Overall, he feels well. Did have fever last night. His son is at the bedside.    ROS: Denies any nausea, vomiting, abdominal pain, diarrhea.   Objective:  Vital Signs  Vitals:   09/17/16 1750 09/17/16 2143 09/18/16 0026 09/18/16 0455  BP:  (!) 143/66  121/76  Pulse:  67  99  Resp:  15  (!) 22  Temp: 99.3 F (37.4 C) (!) 101.2 F (38.4 C) (!) 100.6 F (38.1 C) (!) 100.7 F (38.2 C)  TempSrc:  Oral Oral Oral  SpO2:  98%  93%  Weight:      Height:        Intake/Output Summary (Last 24 hours) at 09/18/16 0839 Last data filed at 09/18/16 4315  Gross per 24 hour  Intake              200 ml  Output                5 ml  Net              195 ml   Filed Weights   09/14/16 2254 09/15/16 0230  Weight: 76.2 kg (168 lb) 73.9 kg (163 lb)    General appearance: alert, cooperative, appears stated age and no distress Resp: Coarse breath sounds bilaterally. Very scattered wheezes appreciated. No  crackles. No rhonchi. Normal effort.   Cardio: regular rate and rhythm, S1, S2 normal, no murmur, click, rub or gallop GI: soft, non-tender; bowel sounds normal; no masses,  no organomegaly Extremities: Minimal edema lower extremities. Neurologic: Awake and alert. Oriented 3. Cranial nerves II-12 intact. Motor strength is equal bilateral upper and lower extremities.  Lab Results:  Data Reviewed: I have personally reviewed following labs and imaging studies  CBC:  Recent Labs Lab 09/12/16 1046  09/13/16 0315 09/14/16 2338 09/15/16 0700 09/16/16 1622 09/17/16 0453 09/18/16 0446  WBC 1.2*  < > 1.5* 0.6* 0.5* 1.2* 1.8* 2.4*  NEUTROABS 0.1*  --  0.1* 0.1* 0.1*  --   --  1.3*  HGB 7.9*  < > 7.8* 8.5* 8.1* 8.9* 8.8* 8.6*  HCT 27.3*  < > 27.0* 29.3* 27.8* 30.0* 29.6* 28.7*  MCV 97.8  < > 98.5 99.0 99.6 97.1 96.4 93.5  PLT 124*  < > 134* 142* 90* 112* 82* 102*  < > = values in this interval not displayed.  Basic Metabolic Panel:  Recent Labs Lab 09/14/16 0458 09/14/16 2338 09/15/16 0700 09/17/16 0453 09/18/16 0446  NA 142 139 135 137 137  K 3.5 3.6 3.5 3.7 3.2*  CL 109 105 104 104 102  CO2 28 27 25  27 25  GLUCOSE 97 100* 104* 106* 119*  BUN 16 21* 21* 21* 16  CREATININE 1.10 1.21 1.22 1.10 0.99  CALCIUM 8.2* 8.3* 8.2* 8.0* 7.9*    GFR: Estimated Creatinine Clearance: 58.4 mL/min (by C-G formula based on SCr of 0.99 mg/dL).  Liver Function Tests:  Recent Labs Lab 09/12/16 1046 09/14/16 2338 09/15/16 0700 09/18/16 0446  AST 38* 40 38 59*  ALT 31 34 30 32  ALKPHOS 59 54 45 53  BILITOT 1.37* 1.9* 2.0* 1.6*  PROT 6.0* 6.8 5.9* 6.0*  ALBUMIN 3.2* 4.2 3.5 3.1*    Cardiac Enzymes:  Recent Labs Lab 09/13/16 0315 09/13/16 1000 09/13/16 1601  TROPONINI <0.03 <0.03 <0.03      Results for orders placed or performed during the hospital encounter of 09/14/16  Blood Culture (routine x 2)     Status: Abnormal   Collection Time: 09/14/16 11:42 PM  Result Value  Ref Range Status   Specimen Description BLOOD RIGHT ANTECUBITAL  Final   Special Requests BOTTLES DRAWN AEROBIC AND ANAEROBIC 5ML EA  Final   Culture  Setup Time   Final    IN BOTH AEROBIC AND ANAEROBIC BOTTLES GRAM POSITIVE COCCI CRITICAL RESULT CALLED TO, READ BACK BY AND VERIFIED WITH: T. GREEN, PHARMD ([facility]) AT 2149 ON 09/15/16 BY C. JESSUP, MLT. Performed at [place] (A)  Final   Report Status 09/17/2016 FINAL  Final   Organism ID, Bacteria STAPHYLOCOCCUS AUREUS  Final      Susceptibility   Staphylococcus aureus - MIC*    CIPROFLOXACIN >=8 RESISTANT Resistant     ERYTHROMYCIN >=8 RESISTANT Resistant     GENTAMICIN <=0.5 SENSITIVE Sensitive     OXACILLIN 0.5 SENSITIVE Sensitive     TETRACYCLINE <=1 SENSITIVE Sensitive     VANCOMYCIN 1 SENSITIVE Sensitive     TRIMETH/SULFA <=10 SENSITIVE Sensitive     CLINDAMYCIN <=0.25 SENSITIVE Sensitive     RIFAMPIN <=0.5 SENSITIVE Sensitive     Inducible Clindamycin NEGATIVE Sensitive     * STAPHYLOCOCCUS AUREUS  Blood Culture ID Panel (Reflexed)     Status: Abnormal   Collection Time: 09/14/16 11:42 PM  Result Value Ref Range Status   Enterococcus species NOT DETECTED NOT DETECTED Final   Listeria monocytogenes NOT DETECTED NOT DETECTED Final   Staphylococcus species DETECTED (A) NOT DETECTED Final    Comment: CRITICAL RESULT CALLED TO, READ BACK BY AND VERIFIED WITH: T. GREEN, PHARMD (WL) AT 2149 ON 09/15/16 BY C. JESSUP, MLT.    Staphylococcus aureus DETECTED (A) NOT DETECTED Final    Comment: CRITICAL RESULT CALLED TO, READ BACK BY AND VERIFIED WITH: T. GREEN, PHARMD (WL) AT 2149 ON 09/15/16 BY C. JESSUP, MLT.    Methicillin resistance NOT DETECTED NOT DETECTED Final   Streptococcus species NOT DETECTED NOT DETECTED Final   Streptococcus agalactiae NOT DETECTED NOT DETECTED Final   Streptococcus pneumoniae NOT DETECTED NOT DETECTED Final   Streptococcus pyogenes NOT DETECTED NOT DETECTED  Final   Acinetobacter baumannii NOT DETECTED NOT DETECTED Final   Enterobacteriaceae species NOT DETECTED NOT DETECTED Final   Enterobacter cloacae complex NOT DETECTED NOT DETECTED Final   Escherichia coli NOT DETECTED NOT DETECTED Final   Klebsiella oxytoca NOT DETECTED NOT DETECTED Final   Klebsiella pneumoniae NOT DETECTED NOT DETECTED Final   Proteus species NOT DETECTED NOT DETECTED Final   Serratia marcescens NOT DETECTED NOT DETECTED Final   Haemophilus influenzae NOT DETECTED NOT DETECTED Final  Neisseria meningitidis NOT DETECTED NOT DETECTED Final   Pseudomonas aeruginosa NOT DETECTED NOT DETECTED Final   Candida albicans NOT DETECTED NOT DETECTED Final   Candida glabrata NOT DETECTED NOT DETECTED Final   Candida krusei NOT DETECTED NOT DETECTED Final   Candida parapsilosis NOT DETECTED NOT DETECTED Final   Candida tropicalis NOT DETECTED NOT DETECTED Final    Comment: Performed at [facility]  Blood Culture (routine x 2)     Status: Abnormal   Collection Time: 09/14/16 11:58 PM  Result Value Ref Range Status   Specimen Description BLOOD BLOOD RIGHT HAND  Final   Special Requests IN PEDIATRIC BOTTLE 2ML  Final   Culture  Setup Time   Final    GRAM POSITIVE COCCI IN CLUSTERS AEROBIC BOTTLE ONLY CRITICAL VALUE NOTED.  VALUE IS CONSISTENT WITH PREVIOUSLY REPORTED AND CALLED VALUE.    Culture (A)  Final    STAPHYLOCOCCUS AUREUS SUSCEPTIBILITIES PERFORMED ON PREVIOUS CULTURE WITHIN THE LAST 5 DAYS. Performed at [facility]    Report Status 09/17/2016 FINAL  Final  Urine culture     Status: None   Collection Time: 09/15/16 12:01 AM  Result Value Ref Range Status   Specimen Description URINE, RANDOM  Final   Special Requests NONE  Final   Culture   Final    NO GROWTH 1 DAY Performed at [facility]    Report Status 09/16/2016 FINAL  Final     Radiology Studies: Dg Chest 2 View  Result Date: 09/17/2016 CLINICAL DATA:  Cough and shortness  of Breath EXAM: CHEST  2 VIEW COMPARISON:  09/14/2016 FINDINGS: Cardiac shadow is within normal limits. Aortic calcifications are again seen. Mild increased vascular congestion with interstitial edema is noted. Some patchy changes are noted in the right lung base which were not well seen on the prior exam consistent with atelectasis. IMPRESSION: Mild increase in vascular congestion with interstitial edema. Mild right basilar atelectasis. Electronically Signed   By: Inez Catalina M.D.   On: 09/17/2016 09:04   Dg Ankle Complete Left  Result Date: 09/16/2016 CLINICAL DATA:  Cellulitis. EXAM: LEFT ANKLE COMPLETE - 3+ VIEW COMPARISON:  No recent prior. FINDINGS: No acute bony or joint abnormality. No evidence of fracture dislocation. Peripheral vascular calcification. IMPRESSION: No acute bony abnormality.  Peripheral vascular disease. Electronically Signed   By: Marcello Moores  Register   On: 09/16/2016 12:32     Medications:  Scheduled: . sodium chloride  250 mL Intravenous Once  . sodium chloride   Intravenous Once  . acetaminophen  650 mg Oral Once  . calcium carbonate  1 tablet Oral QHS  .  ceFAZolin (ANCEF) IV  2 g Intravenous Q8H  . chlorhexidine  5 mL Mouth/Throat BID  . diphenhydrAMINE  25 mg Oral Once  . irbesartan  37.5 mg Oral Daily  . multivitamin with minerals  1 tablet Oral Daily  . pantoprazole  40 mg Oral Daily  . polyethylene glycol  17 g Oral Daily  . posaconazole  300 mg Oral QHS  . potassium chloride  40 mEq Oral Once  . 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 No clear evidence for sepsis. Infectious disease is following. Cultures have been repeated 10/21. Patient is currently on Ancef. Vancomycin and cefepime were discontinued. Tamiflu was also discontinued. Influenza PCR was negative. Source of this infection is not entirely clear, although patient did have cellulitis involving his left ankle area recently. MRI of the ankle has been ordered by ID. TEE has also been recommended. Cardiology consulted by ED. Continue patient's home dose of posaconazole and valacyclovir.   Myelodysplastic syndrome with pancytopenia Being followed by Dr. Irene Limbo and also at [facility] for possible stem cell transplant. Plan is for bone marrow biopsy on Monday. Continue to monitor CBC. Neutrophil counts appear to have improved. No role for Neupogen at this time due to presence of blasts. He did receive 1 unit of blood ordered by his hematologist. Platelet counts noted to be trending down but stable. No evidence for bleeding. Continue to hold Lovenox.  Recently admitted for fluid overload Most likely from chemotherapy related. Echocardiogram showed preserved LV function with no diastolic dysfunction. He appears to be quite euvolemic at this time. A&P is normal. Okay to hold his Lasix for now.  Dyspnea in the setting of known COPD Continues to have on and off cough with what appears to be pleuritic chest pain. Chest x-ray was repeated. Raises concern for mild vascular congestion and interstitial edema. BNP was normal. Findings are likely infectious. He was also given nebulizer treatments with improvement. Had a recent CT scan which is negative for PE. No concerning changes identified on EKG.   Hypokalemia Will be repleted  DVT  Prophylaxis: Changed Lovenox to SCDs due to dropping platelets. Code Status: Full code  Family Communication: Discussed with the patient. Discussed with son at bedside. Son is a pulmonologist. Disposition Plan: Continue management as outlined above.    LOS: 3 days   [place] [facility] Pager [phone] 09/18/2016, 8:39 AM  If 7PM-7AM, please contact night-coverage at [email or web address], password [facility]

## 2016-09-18 NOTE — Progress Notes (Signed)
Subjective:  No new complaints   Antibiotics:  Anti-infectives    Start     Dose/Rate Route Frequency Ordered Stop   09/16/16 1300  ceFAZolin (ANCEF) IVPB 2g/100 mL premix     2 g 200 mL/hr over 30 Minutes Intravenous Every 8 hours 09/16/16 1213     09/15/16 2200  posaconazole (NOXAFIL) delayed-release tablet 300 mg     300 mg Oral Daily at bedtime 09/15/16 0513     09/15/16 1200  oseltamivir (TAMIFLU) capsule 30 mg  Status:  Discontinued     30 mg Oral 2 times daily 09/15/16 1039 09/16/16 0822   09/15/16 1200  vancomycin (VANCOCIN) IVPB 750 mg/150 ml premix  Status:  Discontinued     750 mg 150 mL/hr over 60 Minutes Intravenous Every 12 hours 09/15/16 1056 09/16/16 1159   09/15/16 1000  valACYclovir (VALTREX) tablet 500 mg    Comments:  At night to suppress herpes gingivitis     500 mg Oral 2 times daily 09/15/16 0513     09/15/16 0800  ceFEPIme (MAXIPIME) 2 g in dextrose 5 % 50 mL IVPB  Status:  Discontinued     2 g 100 mL/hr over 30 Minutes Intravenous Every 8 hours 09/15/16 0027 09/16/16 1159   09/15/16 0000  ceFEPIme (MAXIPIME) 2 g in dextrose 5 % 50 mL IVPB     2 g 100 mL/hr over 30 Minutes Intravenous STAT 09/14/16 2358 09/15/16 0036      Medications: Scheduled Meds: . sodium chloride  250 mL Intravenous Once  . sodium chloride   Intravenous Once  . acetaminophen  650 mg Oral Once  . calcium carbonate  1 tablet Oral QHS  .  ceFAZolin (ANCEF) IV  2 g Intravenous Q8H  . chlorhexidine  5 mL Mouth/Throat BID  . diphenhydrAMINE  25 mg Oral Once  . irbesartan  37.5 mg Oral Daily  . multivitamin with minerals  1 tablet Oral Daily  . pantoprazole  40 mg Oral Daily  . polyethylene glycol  17 g Oral Daily  . posaconazole  300 mg Oral QHS  . valACYclovir  500 mg Oral BID   Continuous Infusions:  PRN Meds:.acetaminophen **OR** acetaminophen, albuterol, heparin lock flush, heparin lock flush, magic mouthwash, magnesium citrate, nitroGLYCERIN, ondansetron **OR**  ondansetron (ZOFRAN) IV, ondansetron, senna-docusate, sodium chloride flush, sodium chloride flush    Objective: Weight change:   Intake/Output Summary (Last 24 hours) at 09/18/16 1726 Last data filed at 09/18/16 1300  Gross per 24 hour  Intake              100 ml  Output                3 ml  Net               97 ml   Blood pressure (!) 126/54, pulse 75, temperature (!) 100.5 F (38.1 C), temperature source Oral, resp. rate 20, height 5\' 7"  (1.702 m), weight 163 lb (73.9 kg), SpO2 93 %. Temp:  [99.3 F (37.4 C)-101.2 F (38.4 C)] 100.5 F (38.1 C) (10/22 1418) Pulse Rate:  [67-99] 75 (10/22 1418) Resp:  [15-22] 20 (10/22 1418) BP: (121-143)/(54-76) 126/54 (10/22 1418) SpO2:  [93 %-98 %] 93 % (10/22 1418)  Physical Exam: General: Alert and awake, oriented x3, not in any acute distress. HEENT: anicteric sclera, pupils reactive to light and accommodation, EOMI CVS regular rate no mgr Chest:  no wheezing resp distress, decreased  BS at the bases Abdomen: softnondistended Extremities: ankle nontender. Scab from prior cut and cellulitis episode Skin: no rashes Neuro: nonfocal  CBC:  CBC Latest Ref Rng & Units 09/18/2016 09/17/2016 09/16/2016  WBC 4.0 - 10.5 K/uL 2.4(L) 1.8(L) 1.2(LL)  Hemoglobin 13.0 - 17.0 g/dL 8.6(L) 8.8(L) 8.9(L)  Hematocrit 39.0 - 52.0 % 28.7(L) 29.6(L) 30.0(L)  Platelets 150 - 400 K/uL 102(L) 82(L) 112(L)      BMET  Recent Labs  09/17/16 0453 09/18/16 0446  NA 137 137  K 3.7 3.2*  CL 104 102  CO2 27 25  GLUCOSE 106* 119*  BUN 21* 16  CREATININE 1.10 0.99  CALCIUM 8.0* 7.9*     Liver Panel   Recent Labs  09/18/16 0446  PROT 6.0*  ALBUMIN 3.1*  AST 59*  ALT 32  ALKPHOS 53  BILITOT 1.6*       Sedimentation Rate No results for input(s): ESRSEDRATE in the last 72 hours. C-Reactive Protein No results for input(s): CRP in the last 72 hours.  Micro Results: Recent Results (from the past 720 hour(s))  Blood Culture (routine  x 2)     Status: Abnormal   Collection Time: 09/14/16 11:42 PM  Result Value Ref Range Status   Specimen Description BLOOD RIGHT ANTECUBITAL  Final   Special Requests BOTTLES DRAWN AEROBIC AND ANAEROBIC 5ML EA  Final   Culture  Setup Time   Final    IN BOTH AEROBIC AND ANAEROBIC BOTTLES GRAM POSITIVE COCCI CRITICAL RESULT CALLED TO, READ BACK BY AND VERIFIED WITH: T. GREEN, PHARMD ([place]) AT 2149 ON 09/15/16 BY C. JESSUP, MLT. Performed at [place] (A)  Final   Report Status 09/17/2016 FINAL  Final   Organism ID, Bacteria STAPHYLOCOCCUS AUREUS  Final      Susceptibility   Staphylococcus aureus - MIC*    CIPROFLOXACIN >=8 RESISTANT Resistant     ERYTHROMYCIN >=8 RESISTANT Resistant     GENTAMICIN <=0.5 SENSITIVE Sensitive     OXACILLIN 0.5 SENSITIVE Sensitive     TETRACYCLINE <=1 SENSITIVE Sensitive     VANCOMYCIN 1 SENSITIVE Sensitive     TRIMETH/SULFA <=10 SENSITIVE Sensitive     CLINDAMYCIN <=0.25 SENSITIVE Sensitive     RIFAMPIN <=0.5 SENSITIVE Sensitive     Inducible Clindamycin NEGATIVE Sensitive     * STAPHYLOCOCCUS AUREUS  Blood Culture ID Panel (Reflexed)     Status: Abnormal   Collection Time: 09/14/16 11:42 PM  Result Value Ref Range Status   Enterococcus species NOT DETECTED NOT DETECTED Final   Listeria monocytogenes NOT DETECTED NOT DETECTED Final   Staphylococcus species DETECTED (A) NOT DETECTED Final    Comment: CRITICAL RESULT CALLED TO, READ BACK BY AND VERIFIED WITH: T. GREEN, PHARMD ([facility]) AT 2149 ON 09/15/16 BY C. JESSUP, MLT.    Staphylococcus aureus DETECTED (A) NOT DETECTED Final    Comment: CRITICAL RESULT CALLED TO, READ BACK BY AND VERIFIED WITH: T. GREEN, PHARMD ([facility]) AT 2149 ON 09/15/16 BY C. JESSUP, MLT.    Methicillin resistance NOT DETECTED NOT DETECTED Final   Streptococcus species NOT DETECTED NOT DETECTED Final   Streptococcus agalactiae NOT DETECTED NOT DETECTED Final   Streptococcus pneumoniae NOT  DETECTED NOT DETECTED Final   Streptococcus pyogenes NOT DETECTED NOT DETECTED Final   Acinetobacter baumannii NOT DETECTED NOT DETECTED Final   Enterobacteriaceae species NOT DETECTED NOT DETECTED Final   Enterobacter cloacae complex NOT DETECTED NOT DETECTED Final   Escherichia coli NOT  DETECTED NOT DETECTED Final   Klebsiella oxytoca NOT DETECTED NOT DETECTED Final   Klebsiella pneumoniae NOT DETECTED NOT DETECTED Final   Proteus species NOT DETECTED NOT DETECTED Final   Serratia marcescens NOT DETECTED NOT DETECTED Final   Haemophilus influenzae NOT DETECTED NOT DETECTED Final   Neisseria meningitidis NOT DETECTED NOT DETECTED Final   Pseudomonas aeruginosa NOT DETECTED NOT DETECTED Final   Candida albicans NOT DETECTED NOT DETECTED Final   Candida glabrata NOT DETECTED NOT DETECTED Final   Candida krusei NOT DETECTED NOT DETECTED Final   Candida parapsilosis NOT DETECTED NOT DETECTED Final   Candida tropicalis NOT DETECTED NOT DETECTED Final    Comment: Performed at [facility]  Blood Culture (routine x 2)     Status: Abnormal   Collection Time: 09/14/16 11:58 PM  Result Value Ref Range Status   Specimen Description BLOOD BLOOD RIGHT HAND  Final   Special Requests IN PEDIATRIC BOTTLE 2ML  Final   Culture  Setup Time   Final    GRAM POSITIVE COCCI IN CLUSTERS AEROBIC BOTTLE ONLY CRITICAL VALUE NOTED.  VALUE IS CONSISTENT WITH PREVIOUSLY REPORTED AND CALLED VALUE.    Culture (A)  Final    STAPHYLOCOCCUS AUREUS SUSCEPTIBILITIES PERFORMED ON PREVIOUS CULTURE WITHIN THE LAST 5 DAYS. Performed at [facility]    Report Status 09/17/2016 FINAL  Final  Urine culture     Status: None   Collection Time: 09/15/16 12:01 AM  Result Value Ref Range Status   Specimen Description URINE, RANDOM  Final   Special Requests NONE  Final   Culture   Final    NO GROWTH 1 DAY Performed at [facility]    Report Status 09/16/2016 FINAL  Final  Culture, blood (routine x  2)     Status: None (Preliminary result)   Collection Time: 09/17/16  4:50 AM  Result Value Ref Range Status   Specimen Description BLOOD LEFT ARM  Final   Special Requests BOTTLES DRAWN AEROBIC AND ANAEROBIC 5CC EACH  Final   Culture   Final    NO GROWTH 1 DAY Performed at [facility]    Report Status PENDING  Incomplete  Culture, blood (routine x 2)     Status: None (Preliminary result)   Collection Time: 09/17/16  4:55 AM  Result Value Ref Range Status   Specimen Description BLOOD LEFT HAND  Final   Special Requests BOTTLES DRAWN AEROBIC ONLY 5CC  Final   Culture   Final    NO GROWTH 1 DAY Performed at [facility]    Report Status PENDING  Incomplete    Studies/Results: Dg Chest 2 View  Result Date: 09/17/2016 CLINICAL DATA:  Cough and shortness of Breath EXAM: CHEST  2 VIEW COMPARISON:  09/14/2016 FINDINGS: Cardiac shadow is within normal limits. Aortic calcifications are again seen. Mild increased vascular congestion with interstitial edema is noted. Some patchy changes are noted in the right lung base which were not well seen on the prior exam consistent with atelectasis. IMPRESSION: Mild increase in vascular congestion with interstitial edema. Mild right basilar atelectasis. Electronically Signed   By: Inez Catalina M.D.   On: 09/17/2016 09:04   Mr Ankle Left W Wo Contrast  Result Date: 09/18/2016 CLINICAL DATA:  Swelling of the ankles with fever and bacteremia. Scratch/laceration. History of myelodysplastic syndrome. Clinical concern for possible osteomyelitis. EXAM: MRI OF THE LEFT ANKLE WITHOUT AND WITH CONTRAST TECHNIQUE: Multiplanar, multisequence MR imaging of the ankle was  performed before and after the administration of intravenous contrast. CONTRAST:  7mL MULTIHANCE GADOBENATE DIMEGLUMINE 529 MG/ML IV SOLN COMPARISON:  09/16/2016 radiographs FINDINGS: Despite efforts by the technologist and patient, motion artifact is present on today's exam and could  not be eliminated. This reduces exam sensitivity and specificity. TENDONS Peroneal: Thinning and possibly longitudinal tearing of the peroneus brevis along the lateral malleolus. Posteromedial: Distal tibialis posterior tendinopathy. Anterior: Mild tibialis anterior tendinopathy. Achilles: Unremarkable Plantar Fascia: Thickening of the medial band of the plantar fascia with underlying subcutaneous edema suspicious for plantar fasciitis. Low-level edema within the adjacent flexor digitorum brevis muscle. LIGAMENTS Lateral: Slightly thickened anterior talofibular ligament. Medial: Unremarkable CARTILAGE Ankle Joint: Unremarkable Subtalar Joints/Sinus Tarsi: Unremarkable Bones: No abnormal osseous enhancement are abnormal osseous edema along the ankle to suggest osteomyelitis. Other: No drainable abscess. Low-grade subcutaneous edema along the ankle both anteriorly and posteriorly. IMPRESSION: IMPRESSION 1. No findings of osteomyelitis or abscess. There is low-grade subcutaneous edema around the ankle which is nonspecific -cellulitis not completely excluded. 2. Distal tibialis posterior tendinopathy, correlate clinically in assessing for tibialis posterior dysfunction. 3. Thinning and possible mild longitudinal tearing of the peroneus brevis tendon adjacent to the lateral malleolus. 4. Mild plantar fasciitis. 5. Suspect remote sprain/mild injury of the anterior talofibular ligament. Electronically Signed   By: Van Clines M.D.   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 male with  MDS followed by hematology here  and [facility] with chronic suppression of cell lines and profound neutropenia admitted with MSSA bacteremia with sepsis likely due to seeding of blood from episode of cellulitis after cut several weeks ago       [place] [facility]icrobial Management Team Staphylococcus aureus bacteremia   Staphylococcus aureus bacteremia (SAB) is associated with a high rate of complications and mortality.  Specific aspects of clinical management are critical to optimizing the outcome of patients with SAB.  Therefore, the [facility] Antimicrobial Management Team [facility]) has initiated an intervention aimed at improving the management of SAB at [facility].  To do so, Infectious Diseases physicians are providing an evidence-based consult for the management of all patients with SAB.     Yes No Comments  Perform follow-up blood cultures (even if the patient is afebrile) to ensure clearance of bacteremia [x]  []  Taken yesterday and NO GROWTH x 1 day  Remove vascular catheter and obtain follow-up blood cultures after the removal of the catheter []  []  DO NOT PLACE PICC UNTIL TODAYS CULTURES ARE NO GROWTH AT 4-5 DAYS  Perform echocardiography to evaluate for endocarditis (transthoracic ECHO is 40-50% sensitive, TEE is > 90% sensitive) []  []  Please keep in mind, that neither test can definitively EXCLUDE endocarditis, and that should clinical suspicion remain high for endocarditis the patient should then still be treated with an "endocarditis" duration of therapy = 6 weeks  TEE is NEEDED   Consult electrophysiologist to evaluate implanted cardiac device (pacemaker, ICD) []  []  NA  Ensure source control []  []  Have all abscesses been drained effectively? Have deep seeded infections (septic joints or osteomyelitis) had appropriate surgical  debridement?  Ankle was likely source.  MRI DID NOT SHOW deep infection  Investigate for "metastatic" sites of infection []  []  Does the patient have ANY symptom or physical exam finding that  would suggest a deeper infection (back or neck pain that may be suggestive of vertebral osteomyelitis or epidural abscess, muscle pain that could be a symptom of pyomyositis)?  Keep in mind that for deep seeded infections MRI imaging with contrast is preferred rather than other often insensitive tests such as plain x-rays, especially early in a patient's presentation.  Change antibiotic therapy to cefazolin []  []  Beta-lactam antibiotics are preferred for MSSA due to higher cure rates.   If on Vancomycin, goal trough should be 15 - 20 mcg/mL  Estimated duration of IV antibiotic therapy: I would advocate for 4 weeks even if we exclude endocarditis by TEE.  This patient does not have a straightforward removal source such as an PICC to blame his bacteremia and he is immunocompromised and we will at most prove  Clearance of bactermia 3 days into hospital rather than more rapidly []  []  Consult case management for probably prolonged outpatient IV antibiotic therapy   #2 MDS: I had extensive discussion yesterday with patient and son and patient again today. He would prefer not to risk having the BM biopsy done when he might still be potentially bacteremic (one thing I am anxious about)  I have spoken with IR radiology and Bone Marrow biopy to be deferred until Tuesday.  They DO WANT TO MAKE SURE PATH READ and ready for review the following week with Oncologist at [facility].  Dr. Linus Salmons is back tomorrow.      LOS: 3 days   Alcide Evener 09/18/2016, 5:26 PM

## 2016-09-19 ENCOUNTER — Ambulatory Visit (HOSPITAL_COMMUNITY): Payer: Medicare Other

## 2016-09-19 ENCOUNTER — Ambulatory Visit (HOSPITAL_COMMUNITY): Admission: RE | Admit: 2016-09-19 | Payer: Medicare Other | Source: Ambulatory Visit

## 2016-09-19 ENCOUNTER — Ambulatory Visit: Payer: Medicare Other | Admitting: Internal Medicine

## 2016-09-19 LAB — TYPE AND SCREEN
ABO/RH(D): O NEG
ANTIBODY SCREEN: NEGATIVE
UNIT DIVISION: 0

## 2016-09-19 LAB — CBC WITH DIFFERENTIAL/PLATELET
Basophils Absolute: 0 K/uL (ref 0.0–0.1)
Basophils Relative: 0 %
Eosinophils Absolute: 0 K/uL (ref 0.0–0.7)
Eosinophils Relative: 1 %
HCT: 27.6 % — ABNORMAL LOW (ref 39.0–52.0)
Hemoglobin: 8.4 g/dL — ABNORMAL LOW (ref 13.0–17.0)
Lymphocytes Relative: 36 %
Lymphs Abs: 0.9 K/uL (ref 0.7–4.0)
MCH: 28.9 pg (ref 26.0–34.0)
MCHC: 30.4 g/dL (ref 30.0–36.0)
MCV: 94.8 fL (ref 78.0–100.0)
Monocytes Absolute: 0.3 K/uL (ref 0.1–1.0)
Monocytes Relative: 12 %
Neutro Abs: 1.4 K/uL — ABNORMAL LOW (ref 1.7–7.7)
Neutrophils Relative %: 51 %
Platelets: 100 K/uL — ABNORMAL LOW (ref 150–400)
RBC: 2.91 MIL/uL — ABNORMAL LOW (ref 4.22–5.81)
RDW: 20.3 % — ABNORMAL HIGH (ref 11.5–15.5)
WBC: 2.6 K/uL — ABNORMAL LOW (ref 4.0–10.5)

## 2016-09-19 LAB — PROTIME-INR
INR: 1.26
PROTHROMBIN TIME: 15.9 s — AB (ref 11.4–15.2)

## 2016-09-19 NOTE — Progress Notes (Signed)
[place] for Infectious Disease   Reason for visit: Follow up on MSSA bacteremia  Interval History: still with fever, 102 today; WBC up to 2.6; bone marrow and TEE tomorrow  CXR independently reviewed and clear  Physical Exam: Constitutional:  Vitals:   09/19/16 0549 09/19/16 0850  BP: (!) 146/52   Pulse: 98   Resp: 16   Temp: (!) 102.6 F (39.2 C) 98.4 F (36.9 C)   patient appears in NAD Eyes: anicteric HENT: no thrush Respiratory: Normal respiratory effort; CTA B Cardiovascular: RRR GI: soft, nt, nd  Review of Systems: Constitutional: positive for fevers or negative for anorexia Gastrointestinal: negative for diarrhea Musculoskeletal: negative for myalgias and arthralgias  Lab Results  Component Value Date   WBC 2.6 (L) 09/19/2016   HGB 8.4 (L) 09/19/2016   HCT 27.6 (L) 09/19/2016   MCV 94.8 09/19/2016   PLT 100 (L) 09/19/2016    Lab Results  Component Value Date   CREATININE 0.99 09/18/2016   BUN 16 09/18/2016   NA 137 09/18/2016   K 3.2 (L) 09/18/2016   CL 102 09/18/2016   CO2 25 09/18/2016    Lab Results  Component Value Date   ALT 32 09/18/2016   AST 59 (H) 09/18/2016   ALKPHOS 53 09/18/2016     Microbiology: Recent Results (from the past 240 hour(s))  Blood Culture (routine x 2)     Status: Abnormal   Collection Time: 09/14/16 11:42 PM  Result Value Ref Range Status   Specimen Description BLOOD RIGHT ANTECUBITAL  Final   Special Requests BOTTLES DRAWN AEROBIC AND ANAEROBIC 5ML EA  Final   Culture  Setup Time   Final    IN BOTH AEROBIC AND ANAEROBIC BOTTLES GRAM POSITIVE COCCI CRITICAL RESULT CALLED TO, READ BACK BY AND VERIFIED WITH: T. GREEN, PHARMD ([place]) AT 2149 ON 09/15/16 BY C. JESSUP, MLT. Performed at [place] (A)  Final   Report Status 09/17/2016 FINAL  Final   Organism ID, Bacteria STAPHYLOCOCCUS AUREUS  Final      Susceptibility   Staphylococcus aureus - MIC*    CIPROFLOXACIN  >=8 RESISTANT Resistant     ERYTHROMYCIN >=8 RESISTANT Resistant     GENTAMICIN <=0.5 SENSITIVE Sensitive     OXACILLIN 0.5 SENSITIVE Sensitive     TETRACYCLINE <=1 SENSITIVE Sensitive     VANCOMYCIN 1 SENSITIVE Sensitive     TRIMETH/SULFA <=10 SENSITIVE Sensitive     CLINDAMYCIN <=0.25 SENSITIVE Sensitive     RIFAMPIN <=0.5 SENSITIVE Sensitive     Inducible Clindamycin NEGATIVE Sensitive     * STAPHYLOCOCCUS AUREUS  Blood Culture ID Panel (Reflexed)     Status: Abnormal   Collection Time: 09/14/16 11:42 PM  Result Value Ref Range Status   Enterococcus species NOT DETECTED NOT DETECTED Final   Listeria monocytogenes NOT DETECTED NOT DETECTED Final   Staphylococcus species DETECTED (A) NOT DETECTED Final    Comment: CRITICAL RESULT CALLED TO, READ BACK BY AND VERIFIED WITH: T. GREEN, PHARMD ([facility]) AT 2149 ON 09/15/16 BY C. JESSUP, MLT.    Staphylococcus aureus DETECTED (A) NOT DETECTED Final    Comment: CRITICAL RESULT CALLED TO, READ BACK BY AND VERIFIED WITH: T. GREEN, PHARMD ([facility]) AT 2149 ON 09/15/16 BY C. JESSUP, MLT.    Methicillin resistance NOT DETECTED NOT DETECTED Final   Streptococcus species NOT DETECTED NOT DETECTED Final   Streptococcus agalactiae NOT DETECTED NOT DETECTED Final   Streptococcus pneumoniae  NOT DETECTED NOT DETECTED Final   Streptococcus pyogenes NOT DETECTED NOT DETECTED Final   Acinetobacter baumannii NOT DETECTED NOT DETECTED Final   Enterobacteriaceae species NOT DETECTED NOT DETECTED Final   Enterobacter cloacae complex NOT DETECTED NOT DETECTED Final   Escherichia coli NOT DETECTED NOT DETECTED Final   Klebsiella oxytoca NOT DETECTED NOT DETECTED Final   Klebsiella pneumoniae NOT DETECTED NOT DETECTED Final   Proteus species NOT DETECTED NOT DETECTED Final   Serratia marcescens NOT DETECTED NOT DETECTED Final   Haemophilus influenzae NOT DETECTED NOT DETECTED Final   Neisseria meningitidis NOT DETECTED NOT DETECTED Final   Pseudomonas aeruginosa  NOT DETECTED NOT DETECTED Final   Candida albicans NOT DETECTED NOT DETECTED Final   Candida glabrata NOT DETECTED NOT DETECTED Final   Candida krusei NOT DETECTED NOT DETECTED Final   Candida parapsilosis NOT DETECTED NOT DETECTED Final   Candida tropicalis NOT DETECTED NOT DETECTED Final    Comment: Performed at [facility]  Blood Culture (routine x 2)     Status: Abnormal   Collection Time: 09/14/16 11:58 PM  Result Value Ref Range Status   Specimen Description BLOOD BLOOD RIGHT HAND  Final   Special Requests IN PEDIATRIC BOTTLE 2ML  Final   Culture  Setup Time   Final    GRAM POSITIVE COCCI IN CLUSTERS AEROBIC BOTTLE ONLY CRITICAL VALUE NOTED.  VALUE IS CONSISTENT WITH PREVIOUSLY REPORTED AND CALLED VALUE.    Culture (A)  Final    STAPHYLOCOCCUS AUREUS SUSCEPTIBILITIES PERFORMED ON PREVIOUS CULTURE WITHIN THE LAST 5 DAYS. Performed at [facility]    Report Status 09/17/2016 FINAL  Final  Urine culture     Status: None   Collection Time: 09/15/16 12:01 AM  Result Value Ref Range Status   Specimen Description URINE, RANDOM  Final   Special Requests NONE  Final   Culture   Final    NO GROWTH 1 DAY Performed at [facility]    Report Status 09/16/2016 FINAL  Final  Culture, blood (routine x 2)     Status: None (Preliminary result)   Collection Time: 09/17/16  4:50 AM  Result Value Ref Range Status   Specimen Description BLOOD LEFT ARM  Final   Special Requests BOTTLES DRAWN AEROBIC AND ANAEROBIC 5CC EACH  Final   Culture   Final    NO GROWTH 1 DAY Performed at [facility]    Report Status PENDING  Incomplete  Culture, blood (routine x 2)     Status: None (Preliminary result)   Collection Time: 09/17/16  4:55 AM  Result Value Ref Range Status   Specimen Description BLOOD LEFT HAND  Final   Special Requests BOTTLES DRAWN AEROBIC ONLY 5CC  Final   Culture   Final    NO GROWTH 1 DAY Performed at [facility]    Report Status  PENDING  Incomplete    Impression/Plan:  1. MSSA bacteremia - on cefazolin.  TEE tomorrow.  Duration depends on TEE Picc ok tomorrow if repeat blood cultures remain negative Home Health  2.  Fever - persistent.  May be due to #1 vs tumor fever with MDS.  WBC up today and mounting a response also could account for continued fever.    3.  MDS - bone marrow tomorrow

## 2016-09-19 NOTE — Care Management Important Message (Signed)
Important Message  Patient Details  Name: Warren [name] MRN: [id number] Date of Birth: October 15, 1941   Medicare Important Message Given:  Yes    Camillo Flaming 09/19/2016, 10:33 AMImportant Message  Patient Details  Name: Warren [name] MRN: [id number] Date of Birth: [birth year]   Medicare Important Message Given:  Yes    Camillo Flaming 09/19/2016, 10:33 AM

## 2016-09-19 NOTE — Progress Notes (Signed)
[facility] PROGRESS NOTE  [place]EN [place] CBS:[id number] DOB: 1941/06/25 DOA: 09/14/2016  PCP: Wenda Low, MD  Brief History/Interval Summary: Warren [name] is a 75 y.o. male with MDS who was admitted recently for shortness of breath and respiratory failure with fluid overload suspected to be secondary to chemotherapy. Patient was discharged home on 10/18 and then subsequently developed fever and chills at home and so he came back to the hospital. Patient found to have neutropenia. He was hospitalized for further management. Blood Cultures returned positive for staph aureus.  Reason for Visit: Neutropenic fever  Consultants: Oncology. Infectious disease. Interventional radiology.  Procedures: Bone marrow biopsy and TEE is scheduled for 10/24  Antibiotics: Vancomycin, cefepime , stopped on 10/20 Tamiflu initiated 10/19. Stopped on 10/20. Cefazolin 10/20  Subjective/Interval History: Patient concerned about persistent fevers. His breathing he says is "shallow". Dry cough. No chest pain today. No nausea, vomiting. His wife is at the bedside.   ROS: Denies any nausea, vomiting, abdominal pain, diarrhea.   Objective:  Vital Signs  Vitals:   09/18/16 1941 09/18/16 2142 09/19/16 0549 09/19/16 0850  BP:  (!) 122/54 (!) 146/52   Pulse:  71 98   Resp:  18 16   Temp: 99.3 F (37.4 C) 99.2 F (37.3 C) (!) 102.6 F (39.2 C) 98.4 F (36.9 C)  TempSrc: Oral Oral Oral   SpO2:  97% 92%   Weight:      Height:        Intake/Output Summary (Last 24 hours) at 09/19/16 0920 Last data filed at 09/19/16 0600  Gross per 24 hour  Intake              200 ml  Output                4 ml  Net              196 ml   Filed Weights   09/14/16 2254 09/15/16 0230  Weight: 76.2 kg (168 lb) 73.9 kg (163 lb)    General appearance: alert, cooperative, appears stated age and no distress Resp: No wheezing heard today. Good air entry bilaterally. No rhonchi. Normal effort.   Cardio:  regular rate and rhythm, S1, S2 normal, no murmur, click, rub or gallop GI: soft, non-tender; bowel sounds normal; no masses,  no organomegaly Extremities: Minimal edema lower extremities. Neurologic: Awake and alert. Oriented 3. Cranial nerves II-12 intact. Motor strength is equal bilateral upper and lower extremities.  Lab Results:  Data Reviewed: I have personally reviewed following labs and imaging studies  CBC:  Recent Labs Lab 09/13/16 0315 09/14/16 2338 09/15/16 0700 09/16/16 1622 09/17/16 0453 09/18/16 0446 09/19/16 0409  WBC 1.5* 0.6* 0.5* 1.2* 1.8* 2.4* 2.6*  NEUTROABS 0.1* 0.1* 0.1*  --   --  1.3* 1.4*  HGB 7.8* 8.5* 8.1* 8.9* 8.8* 8.6* 8.4*  HCT 27.0* 29.3* 27.8* 30.0* 29.6* 28.7* 27.6*  MCV 98.5 99.0 99.6 97.1 96.4 93.5 94.8  PLT 134* 142* 90* 112* 82* 102* 100*    Basic Metabolic Panel:  Recent Labs Lab 09/14/16 0458 09/14/16 2338 09/15/16 0700 09/17/16 0453 09/18/16 0446  NA 142 139 135 137 137  K 3.5 3.6 3.5 3.7 3.2*  CL 109 105 104 104 102  CO2 '28 27 25 27 25  '$ GLUCOSE 97 100* 104* 106* 119*  BUN 16 21* 21* 21* 16  CREATININE 1.10 1.21 1.22 1.10 0.99  CALCIUM 8.2* 8.3* 8.2* 8.0* 7.9*    GFR: Estimated  Creatinine Clearance: 58.4 mL/min (by C-G formula based on SCr of 0.99 mg/dL).  Liver Function Tests:  Recent Labs Lab 09/12/16 1046 09/14/16 2338 09/15/16 0700 09/18/16 0446  AST 38* 40 38 59*  ALT 31 34 30 32  ALKPHOS 59 54 45 53  BILITOT 1.37* 1.9* 2.0* 1.6*  PROT 6.0* 6.8 5.9* 6.0*  ALBUMIN 3.2* 4.2 3.5 3.1*    Cardiac Enzymes:  Recent Labs Lab 09/13/16 0315 09/13/16 1000 09/13/16 1601  TROPONINI <0.03 <0.03 <0.03      Results for orders placed or performed during the hospital encounter of 09/14/16  Blood Culture (routine x 2)     Status: Abnormal   Collection Time: 09/14/16 11:42 PM  Result Value Ref Range Status   Specimen Description BLOOD RIGHT ANTECUBITAL  Final   Special Requests BOTTLES DRAWN AEROBIC AND  ANAEROBIC 5ML EA  Final   Culture  Setup Time   Final    IN BOTH AEROBIC AND ANAEROBIC BOTTLES GRAM POSITIVE COCCI CRITICAL RESULT CALLED TO, READ BACK BY AND VERIFIED WITH: T. GREEN, PHARMD (WL) AT 2149 ON 09/15/16 BY C. JESSUP, MLT. Performed at [facility] (A)  Final   Report Status 09/17/2016 FINAL  Final   Organism ID, Bacteria STAPHYLOCOCCUS AUREUS  Final      Susceptibility   Staphylococcus aureus - MIC*    CIPROFLOXACIN >=8 RESISTANT Resistant     ERYTHROMYCIN >=8 RESISTANT Resistant     GENTAMICIN <=0.5 SENSITIVE Sensitive     OXACILLIN 0.5 SENSITIVE Sensitive     TETRACYCLINE <=1 SENSITIVE Sensitive     VANCOMYCIN 1 SENSITIVE Sensitive     TRIMETH/SULFA <=10 SENSITIVE Sensitive     CLINDAMYCIN <=0.25 SENSITIVE Sensitive     RIFAMPIN <=0.5 SENSITIVE Sensitive     Inducible Clindamycin NEGATIVE Sensitive     * STAPHYLOCOCCUS AUREUS  Blood Culture ID Panel (Reflexed)     Status: Abnormal   Collection Time: 09/14/16 11:42 PM  Result Value Ref Range Status   Enterococcus species NOT DETECTED NOT DETECTED Final   Listeria monocytogenes NOT DETECTED NOT DETECTED Final   Staphylococcus species DETECTED (A) NOT DETECTED Final    Comment: CRITICAL RESULT CALLED TO, READ BACK BY AND VERIFIED WITH: T. GREEN, PHARMD ([facility]) AT 2149 ON 09/15/16 BY C. JESSUP, MLT.    Staphylococcus aureus DETECTED (A) NOT DETECTED Final    Comment: CRITICAL RESULT CALLED TO, READ BACK BY AND VERIFIED WITH: T. GREEN, PHARMD ([facility]) AT 2149 ON 09/15/16 BY C. JESSUP, MLT.    Methicillin resistance NOT DETECTED NOT DETECTED Final   Streptococcus species NOT DETECTED NOT DETECTED Final   Streptococcus agalactiae NOT DETECTED NOT DETECTED Final   Streptococcus pneumoniae NOT DETECTED NOT DETECTED Final   Streptococcus pyogenes NOT DETECTED NOT DETECTED Final   Acinetobacter baumannii NOT DETECTED NOT DETECTED Final   Enterobacteriaceae species NOT DETECTED NOT  DETECTED Final   Enterobacter cloacae complex NOT DETECTED NOT DETECTED Final   Escherichia coli NOT DETECTED NOT DETECTED Final   Klebsiella oxytoca NOT DETECTED NOT DETECTED Final   Klebsiella pneumoniae NOT DETECTED NOT DETECTED Final   Proteus species NOT DETECTED NOT DETECTED Final   Serratia marcescens NOT DETECTED NOT DETECTED Final   Haemophilus influenzae NOT DETECTED NOT DETECTED Final   Neisseria meningitidis NOT DETECTED NOT DETECTED Final   Pseudomonas aeruginosa NOT DETECTED NOT DETECTED Final   Candida albicans NOT DETECTED NOT DETECTED Final   Candida glabrata NOT DETECTED NOT DETECTED  Final   Candida krusei NOT DETECTED NOT DETECTED Final   Candida parapsilosis NOT DETECTED NOT DETECTED Final   Candida tropicalis NOT DETECTED NOT DETECTED Final    Comment: Performed at [facility]  Blood Culture (routine x 2)     Status: Abnormal   Collection Time: 09/14/16 11:58 PM  Result Value Ref Range Status   Specimen Description BLOOD BLOOD RIGHT HAND  Final   Special Requests IN PEDIATRIC BOTTLE 2ML  Final   Culture  Setup Time   Final    GRAM POSITIVE COCCI IN CLUSTERS AEROBIC BOTTLE ONLY CRITICAL VALUE NOTED.  VALUE IS CONSISTENT WITH PREVIOUSLY REPORTED AND CALLED VALUE.    Culture (A)  Final    STAPHYLOCOCCUS AUREUS SUSCEPTIBILITIES PERFORMED ON PREVIOUS CULTURE WITHIN THE LAST 5 DAYS. Performed at [facility]    Report Status 09/17/2016 FINAL  Final  Urine culture     Status: None   Collection Time: 09/15/16 12:01 AM  Result Value Ref Range Status   Specimen Description URINE, RANDOM  Final   Special Requests NONE  Final   Culture   Final    NO GROWTH 1 DAY Performed at [facility]    Report Status 09/16/2016 FINAL  Final  Culture, blood (routine x 2)     Status: None (Preliminary result)   Collection Time: 09/17/16  4:50 AM  Result Value Ref Range Status   Specimen Description BLOOD LEFT ARM  Final   Special Requests BOTTLES DRAWN  AEROBIC AND ANAEROBIC 5CC EACH  Final   Culture   Final    NO GROWTH 1 DAY Performed at [facility]    Report Status PENDING  Incomplete  Culture, blood (routine x 2)     Status: None (Preliminary result)   Collection Time: 09/17/16  4:55 AM  Result Value Ref Range Status   Specimen Description BLOOD LEFT HAND  Final   Special Requests BOTTLES DRAWN AEROBIC ONLY 5CC  Final   Culture   Final    NO GROWTH 1 DAY Performed at [facility]    Report Status PENDING  Incomplete     Radiology Studies: Mr Ankle Left W Wo Contrast  Result Date: 09/18/2016 CLINICAL DATA:  Swelling of the ankles with fever and bacteremia. Scratch/laceration. History of myelodysplastic syndrome. Clinical concern for possible osteomyelitis. EXAM: MRI OF THE LEFT ANKLE WITHOUT AND WITH CONTRAST TECHNIQUE: Multiplanar, multisequence MR imaging of the ankle was performed before and after the administration of intravenous contrast. CONTRAST:  64m MULTIHANCE GADOBENATE DIMEGLUMINE 529 MG/ML IV SOLN COMPARISON:  09/16/2016 radiographs FINDINGS: Despite efforts by the technologist and patient, motion artifact is present on today's exam and could not be eliminated. This reduces exam sensitivity and specificity. TENDONS Peroneal: Thinning and possibly longitudinal tearing of the peroneus brevis along the lateral malleolus. Posteromedial: Distal tibialis posterior tendinopathy. Anterior: Mild tibialis anterior tendinopathy. Achilles: Unremarkable Plantar Fascia: Thickening of the medial band of the plantar fascia with underlying subcutaneous edema suspicious for plantar fasciitis. Low-level edema within the adjacent flexor digitorum brevis muscle. LIGAMENTS Lateral: Slightly thickened anterior talofibular ligament. Medial: Unremarkable CARTILAGE Ankle Joint: Unremarkable Subtalar Joints/Sinus Tarsi: Unremarkable Bones: No abnormal osseous enhancement are abnormal osseous edema along the ankle to suggest osteomyelitis.  Other: No drainable abscess. Low-grade subcutaneous edema along the ankle both anteriorly and posteriorly. IMPRESSION: IMPRESSION 1. No findings of osteomyelitis or abscess. There is low-grade subcutaneous edema around the ankle which is nonspecific -cellulitis not completely excluded. 2. Distal tibialis  posterior tendinopathy, correlate clinically in assessing for tibialis posterior dysfunction. 3. Thinning and possible mild longitudinal tearing of the peroneus brevis tendon adjacent to the lateral malleolus. 4. Mild plantar fasciitis. 5. Suspect remote sprain/mild injury of the anterior talofibular ligament. Electronically Signed   By: Gaylyn Rong M.D.   On: 09/18/2016 15:40     Medications:  Scheduled: . sodium chloride  250 mL Intravenous Once  . sodium chloride   Intravenous Once  . acetaminophen  650 mg Oral Once  . calcium carbonate  1 tablet Oral QHS  .  ceFAZolin (ANCEF) IV  2 g Intravenous Q8H  . chlorhexidine  5 mL Mouth/Throat BID  . diphenhydrAMINE  25 mg Oral Once  . irbesartan  37.5 mg Oral Daily  . multivitamin with minerals  1 tablet Oral Daily  . pantoprazole  40 mg Oral Daily  . polyethylene glycol  17 g Oral Daily  . posaconazole  300 mg Oral QHS  . 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 Infectious disease is following. Cultures have been repeated 10/21which have not shown any growth so far. Patient is currently on Ancef. Vancomycin and cefepime were discontinued. Tamiflu was also discontinued. Influenza PCR was negative. Source of this infection is not entirely clear, although patient did have cellulitis involving his left ankle area recently. MRI of the ankle did not show any obvious abscess. No evidence for osteomyelitis. Nonspecific tendon changes were noted. Patient recalls ankle injury remotely. No recent injury. TEE to be scheduled for tomorrow. Continue patient's home dose of posaconazole and valacyclovir.   Myelodysplastic syndrome with pancytopenia Being followed by Dr. Candise Che and also at [facility] for possible stem cell transplant. Plan was for bone marrow biopsy today, however, it has been postponed due to persistent fever. If the repeat blood cultures remained without any growth, bone marrow biopsy to be done tomorrow. Continue to monitor CBC. Neutrophil counts appear to have improved. He did receive 1 unit of blood ordered by his hematologist. Platelet counts noted to be low but stable. No evidence for bleeding. Continue to hold Lovenox.  Recently admitted for fluid overload Most likely from chemotherapy related. Echocardiogram showed preserved LV function with no diastolic dysfunction. He appears to be quite euvolemic at this time. BNP is normal. Okay to hold his Lasix for now.  Dyspnea in the setting of known COPD Continues to have on and off cough with what appears to be pleuritic chest pain. Chest x-ray was repeated. Raised concern for mild vascular congestion and interstitial edema. BNP was normal. Findings are likely infectious. He was also given nebulizer treatments with improvement. Had a recent CT scan which is negative for PE. No concerning changes identified on EKG.   Hypokalemia Repleted. Will recheck tomorrow morning.  DVT Prophylaxis:  Changed Lovenox to SCDs due to dropping platelets. Code Status: Full code  Family Communication: Discussed with the patient and his  wife. Disposition Plan: Continue management as outlined above. Plan is for TEE and bone marrow biopsy tomorrow.    LOS: 4 days   [facility] Pager [phone] 09/19/2016, 9:19 AM  If 7PM-7AM, please contact night-coverage at [email or web address], password [facility]

## 2016-09-19 NOTE — Progress Notes (Addendum)
Patient ID: Warren [name], male   DOB: Oct 16, 1941, 75 y.o.   MRN: [id number] As per ID request, bone marrow biopsy has been rescheduled for 10/24; repeat blood cx's neg to date

## 2016-09-19 NOTE — Progress Notes (Signed)
[name]   HEMATOLOGY/ONCOLOGY INPATIENT PROGRESS NOTE  Date of Service: 09/19/2016  Inpatient Attending: .Bonnielee Haff, MD   SUBJECTIVE  Warren [name] notes he is doing okay but concerned that he is still having fevers. A little frustrate that Bone marrow biopsy and TEE is still pending. It appears that Bone marrow biopsy will be done tomorrow. No other acute new focal symptoms. MRI left ankle with no osteomyelitis/abscess.  OBJECTIVE:  NAD  PHYSICAL EXAMINATION: . Vitals:   09/19/16 0549 09/19/16 0850 09/19/16 1300 09/19/16 1420  BP: (!) 146/52     Pulse: 98     Resp: 16     Temp: (!) 102.6 F (39.2 C) 98.4 F (36.9 C) 98.6 F (37 C) 99.5 F (37.5 C)  TempSrc: Oral  Axillary Oral  SpO2: 92%     Weight:      Height:       Filed Weights   09/14/16 2254 09/15/16 0230  Weight: 168 lb (76.2 kg) 163 lb (73.9 kg)   .Body mass index is 25.53 kg/m.  GENERAL:alert, appears brighter today SKIN: no acute new rashes EYES: normal, conjunctiva are pink and non-injected, sclera clear OROPHARYNX:no exudate, no erythema and lips, buccal mucosa, and tongue normal  NECK: supple, no JVD, thyroid normal size, non-tender, without nodularity LYMPH:  no palpable lymphadenopathy in the cervical, axillary or inguinal LUNGS: clear to auscultation with normal respiratory effort HEART: regular rate & rhythm,  no murmurs and no lower extremity edema ABDOMEN: abdomen soft, non-tender, normoactive bowel sounds  Musculoskeletal: no cyanosis of digits and no clubbing  PSYCH: alert & oriented x 3 with fluent speech NEURO: no focal motor/sensory deficits   MEDICAL HISTORY:  Past Medical History:  Diagnosis Date  . Cancer [facility])    Prostate, Melanoma- lt. Shoulder (no further problems since 2010  . CHF (congestive heart failure) ([place]) 09/13/2016  . Complication of anesthesia    versed gave the adverse reaction pt. ,became aggitated  . COPD (chronic obstructive pulmonary disease) ([facility])    Dr. Annamaria Boots  follows  . Diffuse esophageal spasm   . Diverticulosis   . DJD (degenerative joint disease)   . Multiple trauma     horse accident 2003 L SHOULDER  . Oral herpes   . OSA (obstructive sleep apnea)    mild, rare cpap use  . Peripheral neuropathy ([place])   . Pulmonary nodule   . Spinal stenosis   . Systolic hypertension     SURGICAL HISTORY: Past Surgical History:  Procedure Laterality Date  . ,laceration to head in accident with hourse    . ACOUSTIC NEUROMA LEFT EAR  1999  . BALLOON DILATION N/A 11/25/2014   Procedure: BALLOON DILATION;  Surgeon: Garlan Fair, MD;  Location: [facility];  Service: Endoscopy;  Laterality: N/A;  . COLONOSCOPY WITH PROPOFOL N/A 10/01/2013   Procedure: COLONOSCOPY WITH PROPOFOL;  Surgeon: Garlan Fair, MD;  Location: [facility];  Service: Endoscopy;  Laterality: N/A;  . ESOPHAGOGASTRODUODENOSCOPY (EGD) WITH PROPOFOL N/A 11/25/2014   Procedure: ESOPHAGOGASTRODUODENOSCOPY (EGD) WITH PROPOFOL;  Surgeon: Garlan Fair, MD;  Location: [facility] END[facility];  Service: Endoscopy;  Laterality: N/A;  . EYE SURGERY     cosmetic surgery"brow lift" 4'15  . MULTIPLE PROCEDURES FOR LEFT ARM     ELBOW,RIB FX PNEUMOTHORAX AFTER FALLING OFF MOUNTAIN  . PILONIDAL CYST / SINUS EXCISION    . RADICAL PROSTATECTOMY      SOCIAL HISTORY: Social History   Social History  . Marital status: Married  Spouse name: N/A  . Number of children: N/A  . Years of education: N/A   Occupational History  . Not on file.   Social History Main Topics  . Smoking status: Former Smoker    Packs/day: 1.00    Years: 40.00    Quit date: 11/29/1995  . Smokeless tobacco: Never Used  . Alcohol use Yes     Comment: 1 drink per day  . Drug use: No  . Sexual activity: Not on file   Other Topics Concern  . Not on file   Social History Narrative   CPAP 8 advanced   Patient drinks 5 cups of caffeine daily   Exercises 3-4 times weekly   Son is a pulmonologist    FAMILY  HISTORY: Family History  Problem Relation Age of Onset  . Emphysema Father   . Asthma Father   . Lung cancer Father   . Breast cancer Sister   . Breast cancer Sister   . Skin cancer Brother   . Prostate cancer Brother     ALLERGIES:  is allergic to fluconazole and midazolam.  MEDICATIONS:  Scheduled Meds: . sodium chloride  250 mL Intravenous Once  . sodium chloride   Intravenous Once  . acetaminophen  650 mg Oral Once  . calcium carbonate  1 tablet Oral QHS  .  ceFAZolin (ANCEF) IV  2 g Intravenous Q8H  . chlorhexidine  5 mL Mouth/Throat BID  . diphenhydrAMINE  25 mg Oral Once  . irbesartan  37.5 mg Oral Daily  . multivitamin with minerals  1 tablet Oral Daily  . pantoprazole  40 mg Oral Daily  . polyethylene glycol  17 g Oral Daily  . posaconazole  300 mg Oral QHS  . valACYclovir  500 mg Oral BID   Continuous Infusions:  PRN Meds:.acetaminophen **OR** acetaminophen, albuterol, heparin lock flush, heparin lock flush, magic mouthwash, magnesium citrate, nitroGLYCERIN, ondansetron **OR** ondansetron (ZOFRAN) IV, ondansetron, senna-docusate, sodium chloride flush, sodium chloride flush  REVIEW OF SYSTEMS:    10 Point review of Systems was done is negative except as noted above.   LABORATORY DATA:  I have reviewed the data as listed  .[name] CBC Latest Ref Rng & Units 09/19/2016 09/18/2016 09/17/2016  WBC 4.0 - 10.5 K/uL 2.6(L) 2.4(L) 1.8(L)  Hemoglobin 13.0 - 17.0 g/dL 8.4(L) 8.6(L) 8.8(L)  Hematocrit 39.0 - 52.0 % 27.6(L) 28.7(L) 29.6(L)  Platelets 150 - 400 K/uL 100(L) 102(L) 82(L)   . CBC    Component Value Date/Time   WBC 2.6 (L) 09/19/2016 0409   RBC 2.91 (L) 09/19/2016 0409   HGB 8.4 (L) 09/19/2016 0409   HGB 7.9 (L) 09/12/2016 1046   HCT 27.6 (L) 09/19/2016 0409   HCT 27.3 (L) 09/12/2016 1046   PLT 100 (L) 09/19/2016 0409   PLT 124 (L) 09/12/2016 1046   MCV 94.8 09/19/2016 0409   MCV 97.8 09/12/2016 1046   MCH 28.9 09/19/2016 0409   MCHC 30.4 09/19/2016  0409   RDW 20.3 (H) 09/19/2016 0409   RDW 20.7 (H) 09/12/2016 1046   LYMPHSABS 0.9 09/19/2016 0409   LYMPHSABS 1.0 09/12/2016 1046   MONOABS 0.3 09/19/2016 0409   MONOABS 0.1 09/12/2016 1046   EOSABS 0.0 09/19/2016 0409   EOSABS 0.1 09/12/2016 1046   BASOSABS 0.0 09/19/2016 0409   BASOSABS 0.0 09/12/2016 1046    . CMP Latest Ref Rng & Units 09/18/2016 09/17/2016 09/15/2016  Glucose 65 - 99 mg/dL 119(H) 106(H) 104(H)  BUN 6 - 20 mg/dL  16 21(H) 21(H)  Creatinine 0.61 - 1.24 mg/dL 0.99 1.10 1.22  Sodium 135 - 145 mmol/L 137 137 135  Potassium 3.5 - 5.1 mmol/L 3.2(L) 3.7 3.5  Chloride 101 - 111 mmol/L 102 104 104  CO2 22 - 32 mmol/L '25 27 25  '$ Calcium 8.9 - 10.3 mg/dL 7.9(L) 8.0(L) 8.2(L)  Total Protein 6.5 - 8.1 g/dL 6.0(L) - 5.9(L)  Total Bilirubin 0.3 - 1.2 mg/dL 1.6(H) - 2.0(H)  Alkaline Phos 38 - 126 U/L 53 - 45  AST 15 - 41 U/L 59(H) - 38  ALT 17 - 63 U/L 32 - 30     RADIOGRAPHIC STUDIES: I have personally reviewed the radiological images as listed and agreed with the findings in the report. Dg Chest 2 View  Result Date: 09/17/2016 CLINICAL DATA:  Cough and shortness of Breath EXAM: CHEST  2 VIEW COMPARISON:  09/14/2016 FINDINGS: Cardiac shadow is within normal limits. Aortic calcifications are again seen. Mild increased vascular congestion with interstitial edema is noted. Some patchy changes are noted in the right lung base which were not well seen on the prior exam consistent with atelectasis. IMPRESSION: Mild increase in vascular congestion with interstitial edema. Mild right basilar atelectasis. Electronically Signed   By: Inez Catalina M.D.   On: 09/17/2016 09:04   Dg Chest 2 View  Result Date: 09/12/2016 CLINICAL DATA:  Neutropenic patient with dyspnea and ankle swelling. Patient on chemotherapy. History of prostate cancer. EXAM: CHEST  2 VIEW COMPARISON:  07/17/2016 chest radiograph obtained 02/28/2013 chest CT. FINDINGS: The heart size and mediastinal contours are  within normal limits. Both lungs are clear. Surgical screw is seen over the left humeral head. Stable T6 through T8 mild compression deformities since 2014 comparison accounting for mild mid thoracic kyphosis. No apparent blastic disease. IMPRESSION: No active cardiopulmonary disease. Electronically Signed   By: Ashley Royalty M.D.   On: 09/12/2016 18:02   Dg Ankle Complete Left  Result Date: 09/16/2016 CLINICAL DATA:  Cellulitis. EXAM: LEFT ANKLE COMPLETE - 3+ VIEW COMPARISON:  No recent prior. FINDINGS: No acute bony or joint abnormality. No evidence of fracture dislocation. Peripheral vascular calcification. IMPRESSION: No acute bony abnormality.  Peripheral vascular disease. Electronically Signed   By: Marcello Moores  Register   On: 09/16/2016 12:32   Ct Angio Chest Pe W And/or Wo Contrast  Result Date: 09/13/2016 CLINICAL DATA:  Initial evaluation for acute shortness of breath. History of prostate cancer. EXAM: CT ANGIOGRAPHY CHEST WITH CONTRAST TECHNIQUE: Multidetector CT imaging of the chest was performed using the standard protocol during bolus administration of intravenous contrast. Multiplanar CT image reconstructions and MIPs were obtained to evaluate the vascular anatomy. CONTRAST:  100 cc of Isovue 370. COMPARISON:  Prior radiograph from earlier the same day. FINDINGS: Cardiovascular: Intrathoracic aorta of normal caliber without evidence for aneurysm or other acute abnormality. Visualized great vessels within normal limits. Mild cardiomegaly present. Scattered coronary artery calcifications noted. Small pericardial fusion. Pulmonary arterial tree adequately opacified for evaluation. Main pulmonary artery measures at the upper limits of normal for size at 3 cm in diameter. No filling defect to suggest acute pulmonary embolism. Re-formatted imaging confirms these findings. Mediastinum/Nodes: Thyroid normal. No pathologically enlarged mediastinal, hilar, or axillary lymph nodes identified. Esophagus within  normal limits. Lungs/Pleura: Small layering bilateral pleural effusions present. Mild scattered interlobular septal thickening within the lungs, suggesting mild pulmonary interstitial edema. Underlying emphysema. No focal infiltrates. No pneumothorax. No worrisome pulmonary nodule or mass. Few scattered granulomas noted within the left lung  base. Upper Abdomen: 1.9 cm hypodense lesion within the liver compatible with a cyst. Additional sub cm hypodensity within the right hepatic lobe noted, too small the characterize, but may reflect a small cyst as well. Probable small cyst noted within the right kidney. Remainder the visualized upper abdomen otherwise unremarkable. Musculoskeletal: No acute osseous abnormality. No worrisome lytic or blastic osseous lesion. Scattered mild degenerative changes within the mid thoracic spine. Review of the MIP images confirms the above findings. IMPRESSION: 1. No CT evidence for acute pulmonary embolism. 2. Mild diffuse interlobular septal thickening, suggestive of mild pulmonary interstitial edema. 3. Small layering bilateral pleural effusions. 4. Mild cardiomegaly with associated small pericardial fusion. 5. Emphysema. Electronically Signed   By: Jeannine Boga M.D.   On: 09/13/2016 00:02   Warren Ankle Left W Wo Contrast  Result Date: 09/18/2016 CLINICAL DATA:  Swelling of the ankles with fever and bacteremia. Scratch/laceration. History of myelodysplastic syndrome. Clinical concern for possible osteomyelitis. EXAM: MRI OF THE LEFT ANKLE WITHOUT AND WITH CONTRAST TECHNIQUE: Multiplanar, multisequence Warren imaging of the ankle was performed before and after the administration of intravenous contrast. CONTRAST:  39m MULTIHANCE GADOBENATE DIMEGLUMINE 529 MG/ML IV SOLN COMPARISON:  09/16/2016 radiographs FINDINGS: Despite efforts by the technologist and patient, motion artifact is present on today's exam and could not be eliminated. This reduces exam sensitivity and specificity.  TENDONS Peroneal: Thinning and possibly longitudinal tearing of the peroneus brevis along the lateral malleolus. Posteromedial: Distal tibialis posterior tendinopathy. Anterior: Mild tibialis anterior tendinopathy. Achilles: Unremarkable Plantar Fascia: Thickening of the medial band of the plantar fascia with underlying subcutaneous edema suspicious for plantar fasciitis. Low-level edema within the adjacent flexor digitorum brevis muscle. LIGAMENTS Lateral: Slightly thickened anterior talofibular ligament. Medial: Unremarkable CARTILAGE Ankle Joint: Unremarkable Subtalar Joints/Sinus Tarsi: Unremarkable Bones: No abnormal osseous enhancement are abnormal osseous edema along the ankle to suggest osteomyelitis. Other: No drainable abscess. Low-grade subcutaneous edema along the ankle both anteriorly and posteriorly. IMPRESSION: IMPRESSION 1. No findings of osteomyelitis or abscess. There is low-grade subcutaneous edema around the ankle which is nonspecific -cellulitis not completely excluded. 2. Distal tibialis posterior tendinopathy, correlate clinically in assessing for tibialis posterior dysfunction. 3. Thinning and possible mild longitudinal tearing of the peroneus brevis tendon adjacent to the lateral malleolus. 4. Mild plantar fasciitis. 5. Suspect remote sprain/mild injury of the anterior talofibular ligament. Electronically Signed   By: WVan ClinesM.D.   On: 09/18/2016 15:40   Dg Chest Port 1 View  Result Date: 09/15/2016 CLINICAL DATA:  75year old male with fever. History of prostate cancer and melanoma with chemotherapy. EXAM: PORTABLE CHEST 1 VIEW COMPARISON:  Chest CT dated 09/12/2016 FINDINGS: Single-view of the chest demonstrate mild diffuse interstitial prominence. There is no focal consolidation, pleural effusion, or pneumothorax. The cardiac silhouette is within normal limits. No acute osseous pathology. IMPRESSION: No acute cardiopulmonary process. Electronically Signed   By: AAnner CreteM.D.   On: 09/15/2016 00:42   Transthoracic Echocardiography  Patient:    OTyler, CubitMR #:       [id number] Date: 09/13/2016 Gender:     M Age:        89Height:     170.2 cm Weight:     75.4 kg BSA:        1.9 m^2 Pt. Status: Room:       [address]   KTyrone AppleN  SONOGRAPHER  TTresa Res  [place], Ashley  PERFORMING   [facility], Inpatient  cc:  ------------------------------------------------------------------- LV EF: 60% -   65%  ------------------------------------------------------------------- Indications:      CHF - 428.0.  ------------------------------------------------------------------- History:   PMH:   Congestive heart failure.  Chronic obstructive pulmonary disease.  Risk factors:  Former tobacco use. Hypertension.  ------------------------------------------------------------------- Study Conclusions  - Left ventricle: The cavity size was normal. Systolic function was   normal. The estimated ejection fraction was in the range of 60%   to 65%. Wall motion was normal; there were no regional wall   motion abnormalities. Left ventricular diastolic function   parameters were normal. - Aortic valve: Trileaflet; mildly thickened, mildly calcified   leaflets. There was trivial regurgitation. - Mitral valve: There was trivial regurgitation. - Left atrium: The atrium was mildly dilated. - Pulmonary arteries: PA peak pressure: 38 mm Hg (S). - Pericardium, extracardiac: A trivial, free-flowing pericardial   effusion was identified along the right ventricular free wall.   The fluid had no internal echoes.  Impressions:  - The right ventricular systolic pressure was increased consistent   with mild pulmonary hypertension.  ASSESSMENT & PLAN:  75 year old Caucasian male with  1) Myelodysplastic syndrome (RAEB-2 with about 14-15% blasts) ISS-R score of 7 (Very  High Risk) Cytogenetics show trisomy 8 (intermediate risk ) Presented with Subacute Anemia (with high normal MCV) + leukopenia/Neutropenia - new since September 2016. Patient has normal platelet counts at this time. Foundation one results reviewed patient has TET2, ASXL1, BCOR RUNX1 SETBP1 SRSF2 and STAG2 mutations identified. Repeat after his bone marrow suggested possibly some decrease in his blasts counts 7% by flow cytometry. 2) Febrile neutropenia. Patient has had persistent neutropenia for several months and was on prophylactic Ciprofloxacin/Posaconazole and Acyclovir. Blood cultures note MSSA bacteremia. TTE Negative for endocarditis . Xray of left ankle neg for osteomyelitis. 3) symptomatic anemia . Hemoglobin has improved 8.9 after transfusion of 1 unit of PRBC. 4) Thrombocytopenia - platelet counts lower than baseline due to MSSA sepsis and possibly vancomycin ... Now switched to cefazolin .PLT better at 100k. Plan -interesting patients chronic neutropenia a little better ?reaction to infection vs improvement in MDS -scheduled for CT guided BM Bx tomorrow. Would not delay this further - will f/u on results. - appreciate  ID input -awaiting TEE per ID - Antibiotics narrowed down to IV cefazolin for MSSA bacteremia. Blcx have become negative. -plan for TEE per ID -transfuse irradiated CMV neg PRBC's (use only irradiated and CMV neg products) as needed for hgb<8 or if symptomatic  5) Slightly elevated bilirubin level even before treatment (?GIlbert's) - stable, slightly higher in the setting of infection.  I spent 20 minutes counseling the patient face to face. The total time spent in the appointment was 25 minutes and more than 50% was on counseling and direct patient cares.    Sullivan Lone MD [place] AAHIVMS [facility] Hematology/Oncology Physician [facility]  (Office):       [phone] (Work cell):  [phone] (Fax):           [phone]

## 2016-09-19 NOTE — Progress Notes (Signed)
Pharmacy Antibiotic Note  Warren [name] is a 75 y.o. male admitted on 09/14/2016 with MSSA bacteremia.  Pharmacy has been consulted for cefazolin dosing.  Plan: Cefazolin 2gm IV q8h BMB rescheduled for 10/24, plan TEE PICC after cx neg x 72 hr  Repeat blood cx from 10/21 no growth        Height: 5\' 7"  (170.2 cm) Weight: 163 lb (73.9 kg) IBW/kg (Calculated) : 66.1  Temp (24hrs), Avg:100.3 F (37.9 C), Min:98.4 F (36.9 C), Max:102.6 F (39.2 C)   Recent Labs Lab 09/14/16 0458 09/14/16 2338 09/14/16 2350 09/15/16 0700 09/16/16 1622 09/17/16 0453 09/18/16 0446 09/19/16 0409  WBC  --  0.6*  --  0.5* 1.2* 1.8* 2.4* 2.6*  CREATININE 1.10 1.21  --  1.22  --  1.10 0.99  --   LATICACIDVEN  --   --  1.52 1.0  --   --   --   --     Estimated Creatinine Clearance: 58.4 mL/min (by C-G formula based on SCr of 0.99 mg/dL).    Allergies  Allergen Reactions  . Fluconazole     Rash   . Midazolam Other (See Comments)    Agitated caused by versed   Antimicrobials this admission: Resumed Valtrex & Posaconazole PTA 10/19 cefepime >> 10/20 10/19 vanco >> 10/20 10/19 Oseltamvir >> 10/20 10/20 cefazolin >>  Microbiology results: 10/18 BCx: MSSA 2/2 10/19 UCx: NGF 10/21 BCx (repeat): ngtd  Thank you for allowing pharmacy to be a part of this patient's care.  Minda Ditto PharmD Pager [phone] 09/19/2016, 10:54 AM

## 2016-09-19 NOTE — Progress Notes (Addendum)
Physical Therapy Treatment Patient Details Name: Warren [name] MRN: [id number] DOB: 09-02-1941 Today's Date: 09/19/2016    History of Present Illness      PT Comments    Pt was seen sitting in recliner upon arrival. No c/o of pain before, during, or after treatment. Patient states that he walks 30 minutes everyday. Patient ambulated 600 ft in hallway with minor signs/symptoms of increased fatigue. No c/o increased fatigue during ambulation.   Follow Up Recommendations  Per LPT eval recommended outpatient, however patient would like to focus on getting medically stable and pursue walking program.      Equipment Recommendations  None recommended by PT    Recommendations for Other Services       Precautions / Restrictions Precautions Precautions: Other (comment) Precaution Comments: neutropenic Restrictions Weight Bearing Restrictions: No    Mobility  Bed Mobility                  Transfers Overall transfer level: Independent                  Ambulation/Gait Ambulation/Gait assistance: Min guard (hand-held assist) Ambulation Distance (Feet): 600 Feet Assistive device: 1 person hand held assist Gait Pattern/deviations: Step-through pattern;Drifts right/left Gait velocity: WFL Gait velocity interpretation: at or above normal speed for age/gender General Gait Details: pt. ambulated in hallway with hand held assist. Completed two laps with no c/o fatigue. Pt did portray some signs and symptoms of fatigue by drifting R/L.    Stairs            Wheelchair Mobility    Modified Rankin (Stroke Patients Only)       Balance                                    Cognition Arousal/Alertness: Awake/alert Behavior During Therapy: WFL for tasks assessed/performed Overall Cognitive Status: Within Functional Limits for tasks assessed                      Exercises      General Comments        Pertinent Vitals/Pain Pain  Assessment: No/denies pain    Home Living                      Prior Function            PT Goals (current goals can now be found in the care plan section) Progress towards PT goals: Progressing toward goals    Frequency    Min 3X/week      PT Plan Current plan remains appropriate    Co-evaluation             End of Session Equipment Utilized During Treatment: Gait belt Activity Tolerance: Patient tolerated treatment well Patient left: in chair;with call bell/phone within reach     Time: 1439-1457 PT Time Calculation (min) (ACUTE ONLY): 18 min  Charges:  $Gait Training: 8-22 mins                    G CodesHall [name], SPTA [facility] Acute Rehab [phone]  Present and agree with the above Warren [name]  PTA [facility]  Acute  Rehab Pager      [phone]

## 2016-09-20 ENCOUNTER — Inpatient Hospital Stay (HOSPITAL_COMMUNITY): Payer: Medicare Other

## 2016-09-20 ENCOUNTER — Encounter (HOSPITAL_COMMUNITY)
Admission: EM | Disposition: A | Discharge status: Home Health | Payer: Self-pay | Source: Home / Self Care | Attending: Internal Medicine

## 2016-09-20 ENCOUNTER — Ambulatory Visit (HOSPITAL_COMMUNITY): Admission: RE | Admit: 2016-09-20 | Payer: Medicare Other | Source: Ambulatory Visit | Admitting: Cardiology

## 2016-09-20 ENCOUNTER — Encounter (HOSPITAL_COMMUNITY): Payer: Self-pay | Admitting: Radiology

## 2016-09-20 DIAGNOSIS — R7881 Bacteremia: Secondary | ICD-10-CM

## 2016-09-20 DIAGNOSIS — R509 Fever, unspecified: Secondary | ICD-10-CM

## 2016-09-20 DIAGNOSIS — R0602 Shortness of breath: Secondary | ICD-10-CM

## 2016-09-20 DIAGNOSIS — D469 Myelodysplastic syndrome, unspecified: Secondary | ICD-10-CM | POA: Diagnosis not present

## 2016-09-20 DIAGNOSIS — I313 Pericardial effusion (noninflammatory): Secondary | ICD-10-CM

## 2016-09-20 HISTORY — PX: TEE WITHOUT CARDIOVERSION: SHX5443

## 2016-09-20 LAB — CBC WITH DIFFERENTIAL/PLATELET
BASOS ABS: 0 10*3/uL (ref 0.0–0.1)
BASOS PCT: 0 %
Eosinophils Absolute: 0 10*3/uL (ref 0.0–0.7)
Eosinophils Relative: 1 %
HEMATOCRIT: 26.7 % — AB (ref 39.0–52.0)
Hemoglobin: 8 g/dL — ABNORMAL LOW (ref 13.0–17.0)
LYMPHS ABS: 0.9 10*3/uL (ref 0.7–4.0)
Lymphocytes Relative: 40 %
MCH: 28.2 pg (ref 26.0–34.0)
MCHC: 30 g/dL (ref 30.0–36.0)
MCV: 94 fL (ref 78.0–100.0)
MONO ABS: 0.2 10*3/uL (ref 0.1–1.0)
Monocytes Relative: 10 %
NEUTROS ABS: 1.1 10*3/uL — AB (ref 1.7–7.7)
NEUTROS PCT: 49 %
Platelets: 87 10*3/uL — ABNORMAL LOW (ref 150–400)
RBC: 2.84 MIL/uL — AB (ref 4.22–5.81)
RDW: 19.8 % — AB (ref 11.5–15.5)
WBC: 2.2 10*3/uL — AB (ref 4.0–10.5)
nRBC: 2 /100 WBC — ABNORMAL HIGH

## 2016-09-20 LAB — BASIC METABOLIC PANEL
Anion gap: 7 (ref 5–15)
BUN: 19 mg/dL (ref 6–20)
CALCIUM: 7.9 mg/dL — AB (ref 8.9–10.3)
CO2: 25 mmol/L (ref 22–32)
CREATININE: 1.09 mg/dL (ref 0.61–1.24)
Chloride: 104 mmol/L (ref 101–111)
GFR calc non Af Amer: >60 mL/min (ref >60)
GLUCOSE: 119 mg/dL — AB (ref 65–99)
Potassium: 3.9 mmol/L (ref 3.5–5.1)
Sodium: 136 mmol/L (ref 135–145)

## 2016-09-20 LAB — BONE MARROW EXAM

## 2016-09-20 SURGERY — ECHOCARDIOGRAM, TRANSESOPHAGEAL
Anesthesia: Moderate Sedation

## 2016-09-20 MED ORDER — BUTAMBEN-TETRACAINE-BENZOCAINE 2-2-14 % EX AERO
INHALATION_SPRAY | CUTANEOUS | Status: DC | PRN
  Administered 2016-09-20: 2 via TOPICAL

## 2016-09-20 MED ORDER — SODIUM CHLORIDE 0.9% FLUSH
10.0000 mL | INTRAVENOUS | Status: DC | PRN
  Administered 2016-09-22: 20 mL
  Administered 2016-09-22 – 2016-09-26 (×4): 10 mL
  Filled 2016-09-20 (×5): qty 40

## 2016-09-20 MED ORDER — FENTANYL CITRATE (PF) 100 MCG/2ML IJ SOLN
INTRAMUSCULAR | Status: DC | PRN
  Administered 2016-09-20: 12.5 ug via INTRAVENOUS
  Administered 2016-09-20: 25 ug via INTRAVENOUS

## 2016-09-20 MED ORDER — FENTANYL CITRATE (PF) 100 MCG/2ML IJ SOLN
75.0000 ug | Freq: Once | INTRAMUSCULAR | Status: AC
  Administered 2016-09-20: 75 ug via INTRAVENOUS

## 2016-09-20 MED ORDER — FENTANYL CITRATE (PF) 100 MCG/2ML IJ SOLN
INTRAMUSCULAR | Status: AC
  Filled 2016-09-20: qty 2

## 2016-09-20 MED ORDER — DIPHENHYDRAMINE HCL 50 MG/ML IJ SOLN
INTRAMUSCULAR | Status: DC | PRN
  Administered 2016-09-20: 25 mg via INTRAVENOUS

## 2016-09-20 MED ORDER — FENTANYL CITRATE (PF) 100 MCG/2ML IJ SOLN
50.0000 ug | Freq: Once | INTRAMUSCULAR | Status: AC
  Administered 2016-09-20: 50 ug via INTRAVENOUS

## 2016-09-20 MED ORDER — FENTANYL CITRATE (PF) 100 MCG/2ML IJ SOLN
INTRAMUSCULAR | Status: AC
  Administered 2016-09-20: 75 ug via INTRAVENOUS
  Filled 2016-09-20: qty 4

## 2016-09-20 MED ORDER — DIPHENHYDRAMINE HCL 50 MG/ML IJ SOLN
INTRAMUSCULAR | Status: AC
  Filled 2016-09-20: qty 1

## 2016-09-20 NOTE — Consult Note (Signed)
Name: Warren [name] MRN: [id number] DOB: [birth year]    ADMISSION DATE:  09/14/2016 CONSULTATION DATE: 10/24  REFERRING MD :  Maryland Pink   CHIEF COMPLAINT:  Dyspnea, immunocompromised. ? bronch  BRIEF PATIENT DESCRIPTION:  75 year old male w/ MDS, admitted w/ neutropenia MSSA bacteremia. PCCM asked to see on 10/24  Due to on-going fever and thoughts on bronchoscopy in this setting.   SIGNIFICANT EVENTS  Bone narrow biopsy 10/24>>>  STUDIES:  MR ankle: negative for osteo.  TEE 10/24>>>   HISTORY OF PRESENT ILLNESS:     This is a 75 year old male followed by med/onc w/ known h/o myelodysplastic syndrome, subacute anemia w/ leukopenia and neutropenia. He is on prophylactic Ciprofloxacin/Posaconazole and Acyclovir at home. He was admitted on 10/18 w/ CC: fever and chills. Dx eval demonstrated blood cultures positive for S Aureus. He initially had some dyspnea associated w/ volume overload. This was treated w/ lasix and improved. He was seen by hematology and Infectious disease. Since admit he has had on-going fever in spite of antibiotics. Bone marrow biopsy and TEE are pending. PCCM was asked to see on 10/24 because over the last 2 days he has had a dry cough, "shallow breathing" and also the on-going fever. Specifically to assess role for bronchoscopy.   PAST MEDICAL HISTORY :   has a past medical history of Cancer [facility]); CHF (congestive heart failure) ([place]) (09/13/2016); Complication of anesthesia; COPD (chronic obstructive pulmonary disease) ([place]); Diffuse esophageal spasm; Diverticulosis; DJD (degenerative joint disease); Multiple trauma; Oral herpes; OSA (obstructive sleep apnea); Peripheral neuropathy ([place]); Pulmonary nodule; Spinal stenosis; and Systolic hypertension.  has a past surgical history that includes Pilonidal cyst / sinus excision; ACOUSTIC NEUROMA LEFT EAR (1999); RADICAL PROSTATECTOMY; MULTIPLE PROCEDURES FOR LEFT ARM; Colonoscopy with propofol (N/A, 10/01/2013); Eye  surgery; ,laceration to head in accident with hourse; Esophagogastroduodenoscopy (egd) with propofol (N/A, 11/25/2014); and Balloon dilation (N/A, 11/25/2014). Prior to Admission medications   Medication Sig Start Date End Date Taking? Authorizing Provider  calcium carbonate (TUMS - DOSED IN MG ELEMENTAL CALCIUM) 500 MG chewable tablet Chew 1 tablet by mouth at bedtime.    Yes Historical Provider, MD  chlorhexidine (PERIDEX) 0.12 % solution RINSE 10 ML (CC) IN MOUTH TWICE DAILY AS DIRECTED **SWISH  AND  SPIT,  DO  NOT  SWALLOW** 09/12/16  Yes Gautam Juleen China, MD  ciprofloxacin (CIPRO) 500 MG tablet Take 1 tablet (500 mg total) by mouth 2 (two) times daily. 07/25/16  Yes Brunetta Genera, MD  furosemide (LASIX) 20 MG tablet Take 1 tablet (20 mg total) by mouth daily. 09/14/16  Yes Janece Canterbury, MD  magic mouthwash SOLN Take 5 mLs by mouth 4 (four) times daily as needed for mouth pain. 08/03/16  Yes Heath Lark, MD  Multiple Vitamin (MULTIVITAMIN WITH MINERALS) TABS tablet Take 1 tablet by mouth daily.   Yes Historical Provider, MD  omeprazole (PRILOSEC) 40 MG capsule Take 40 mg by mouth daily before lunch. 1/2 hour before.   Yes Historical Provider, MD  ondansetron (ZOFRAN) 4 MG tablet Take 4 mg by mouth every 8 (eight) hours as needed for nausea or vomiting.  05/07/16  Yes Historical Provider, MD  posaconazole (NOXAFIL) 100 MG TBEC delayed-release tablet Take 3 tablets (300 mg total) by mouth daily. Patient taking differently: Take 300 mg by mouth at bedtime.  08/22/16  Yes Brunetta Genera, MD  potassium chloride (K-DUR) 10 MEQ tablet Take 2 tablets (20 mEq total) by mouth daily. 09/14/16  Yes Janece Canterbury, MD  senna-docusate (SENNA S) 8.6-50 MG tablet Take 2 tablets by mouth 2 (two) times daily as needed for mild constipation or moderate constipation. 09/14/16  Yes Janece Canterbury, MD  valACYclovir (VALTREX) 500 MG tablet Take 1 tablet (500 mg total) by mouth 2 (two) times daily. At night  to suppress herpes gingivitis 08/22/16  Yes Brunetta Genera, MD  valsartan (DIOVAN) 80 MG tablet Take 40 mg by mouth at bedtime.    Yes Historical Provider, MD  nitroGLYCERIN (NITROSTAT) 0.4 MG SL tablet Place 1 tablet (0.4 mg total) under the tongue every 5 (five) minutes as needed for chest pain. 11/11/14   Sueanne Margarita, MD   Allergies  Allergen Reactions  . Fluconazole     Rash   . Midazolam Other (See Comments)    Agitated caused by versed    FAMILY HISTORY:  family history includes Asthma in his father; Breast cancer in his sister and sister; Emphysema in his father; Lung cancer in his father; Prostate cancer in his brother; Skin cancer in his brother. SOCIAL HISTORY:  reports that he quit smoking about 20 years ago. He has a 40.00 pack-year smoking history. He has never used smokeless tobacco. He reports that he drinks alcohol. He reports that he does not use drugs.  ROS Constitutional: Positive for chills, diaphoresis, fever and malaise/fatigue. Negative for weight loss.  HENT: Negative for congestion and nosebleeds.   Eyes: Negative.   Respiratory: Positive for cough, sputum production, shortness of breath and wheezing.        Pain w/ deep breath Cough w/ oyster colored sputum   Cardiovascular: Positive for chest pain and leg swelling. Negative for palpitations and orthopnea.  Gastrointestinal: Negative for abdominal pain, constipation, diarrhea and heartburn.  Genitourinary: Negative.   Musculoskeletal: Positive for myalgias.  Skin: Negative for itching and rash.  Neurological: Positive for weakness. Negative for headaches.  Endo/Heme/Allergies: Negative.   Psychiatric/Behavioral: Negative.    SUBJECTIVE:  Feels warn out and short of breath  VITAL SIGNS: Temp:  [98.6 F (37 C)-101.5 F (38.6 C)] 100 F (37.8 C) (10/24 1025) Pulse Rate:  [83-89] 86 (10/24 1025) Resp:  [18] 18 (10/24 0609) BP: (135-151)/(60-74) 135/60 (10/24 1025) SpO2:  [93 %-96 %] 94 % (10/24  1025) Room air  PHYSICAL EXAMINATION: General:  75 year old male, no distress. Resting in bed Neuro:  Awake, alert, no focal def  HEENT:  NCAT, no JVD  Cardiovascular:  RRR w/out MRG Lungs:  Diffuse rales, no accessory use  Abdomen:  Soft, not tender, + bowel sounds Musculoskeletal:  Equal st and bulk  Skin:  Warm and dry no sig edema    Recent Labs Lab 09/17/16 0453 09/18/16 0446 09/20/16 0438  NA 137 137 136  K 3.7 3.2* 3.9  CL 104 102 104  CO2 _0 BUN 21* 16 19  CREATININE 1.10 0.99 1.09  GLUCOSE 106* 119* 119*    Recent Labs Lab 09/18/16 0446 09/19/16 0409 09/20/16 0438  HGB 8.6* 8.4* 8.0*  HCT 28.7* 27.6* 26.7*  WBC 2.4* 2.6* 2.2*  PLT 102* 100* 87*   No results found.  ASSESSMENT / PLAN: MSSA bacteremia in Neutropenic host Neutropenic fever  MDS Pancytopenia  Dyspnea  H/o volume overload H/o COPD but most recent PFTs nml  Discussion  This is a 75 year old male w/ MDS who was admitted for neutropenic fever and MSSA bacteremia. Since admit he continues to have intermittent fever and on-going  dyspnea. His son is a Technical brewer and raises the concern could his on-going dyspnea and fever be related to a pulmonary source, given the  interstitial changes on his CT scan earlier this month. This is certainly a possibility, although his on-going dyspnea could also be explained by: ALI associated w/ recent bacteremia, non-cardiogenic pulmonary edema or even septic pulmonary emboli IF we find his heart valves have vegetation.  He has not had any chest imaging since 10/21 of his chest.    Plan Repeat CXR today Cont current abx Cont BDs IF TEE positive for vegetation seems like first step would be to repeat CT chest. As if we identify evidence of septic pulmonary emboli  Bronchoscopy would not likely be needed.   Erick Colace ACNP-BC [place] Pager # [phone] OR # [phone] if no answer    09/20/2016, 10:39 AM

## 2016-09-20 NOTE — Interval H&P Note (Signed)
History and Physical Interval Note:  09/20/2016 2:51 PM  Warren [name]  has presented today for surgery, with the diagnosis of bacteremia  The various methods of treatment have been discussed with the patient and family. After consideration of risks, benefits and other options for treatment, the patient has consented to  Procedure(s): TRANSESOPHAGEAL ECHOCARDIOGRAM (TEE) (N/A) as a surgical intervention .  The patient's history has been reviewed, patient examined, no change in status, stable for surgery.  I have reviewed the patient's chart and labs.  Questions were answered to the patient's satisfaction.     [employer]

## 2016-09-20 NOTE — Procedures (Signed)
Interventional Radiology Procedure Note  Procedure: CT guided aspirate and core biopsy of right iliac bone Complications: None Recommendations: - Bedrest supine x 1 hr - Follow biopsy results  Malayna Noori T. Xaine Sansom, M.D Pager:  [phone]   

## 2016-09-20 NOTE — Progress Notes (Signed)
[facility] called unit to receive report on pt  prior to arrival. En route to transport pt to [facility] for TEE.

## 2016-09-20 NOTE — Progress Notes (Signed)
Pt refused CPAP - qhs.  Pt prefers to remain on O2 while sleeping and will sleep in an inclined position.  Pt encouraged to contact RT if he changes his mind.

## 2016-09-20 NOTE — CV Procedure (Signed)
    Transesophageal echocardiogram  Indication: Bacteremia  Findings: No vegetation   - Small pericardial effusion  - Normal ejection fraction  Please see full report for details.   Candee Furbish, MD

## 2016-09-20 NOTE — Progress Notes (Signed)
Dr. Maryland Pink aware via phone of Rapid Response visit and recommendations. See new entered into EPIC by MD.

## 2016-09-20 NOTE — Progress Notes (Signed)
Echocardiogram Echocardiogram Transesophageal has been performed.  Warren [name] 09/20/2016, 3:35 PM

## 2016-09-20 NOTE — Progress Notes (Signed)
[facility] PROGRESS NOTE  Warren [name] BZJ:[id number] DOB: [birth year] DOA: 09/14/2016  PCP: Wenda Low, MD  Brief History/Interval Summary: Warren [name] is a 75 y.o. male with MDS who was admitted recently for shortness of breath and respiratory failure with fluid overload suspected to be secondary to chemotherapy. Patient was discharged home on 10/18 and then subsequently developed fever and chills at home and so he came back to the hospital. Patient found to have neutropenia. He was hospitalized for further management. Blood Cultures returned positive for staph aureus. Patient to undergo bone marrow biopsy and TEE 10/24.  Reason for Visit: Neutropenic fever  Consultants: Oncology. Infectious disease. Interventional radiology. Pulmonology  Procedures: Bone marrow biopsy and TEE is scheduled for 10/24  Antibiotics: Vancomycin, cefepime , stopped on 10/20 Tamiflu initiated 10/19. Stopped on 10/20. Cefazolin 10/20  Subjective/Interval History: Patient feels well. Still gets dyspneic at times and complains of cough. Occasional wheezing. Denies any nausea or vomiting. His wife is at the bedside.  ROS: Denies any nausea, vomiting, abdominal pain, diarrhea.   Objective:  Vital Signs  Vitals:   09/20/16 0609 09/20/16 0815 09/20/16 0925 09/20/16 1000  BP: (!) 151/74  (!) 142/67 136/72  Pulse: 89  87   Resp: 18     Temp: (!) 101.5 F (38.6 C) 99.7 F (37.6 C) 99.7 F (37.6 C) 98.8 F (37.1 C)  TempSrc: Oral Oral Oral Oral  SpO2: 94% 93%  95%  Weight:      Height:        Intake/Output Summary (Last 24 hours) at 09/20/16 1029 Last data filed at 09/20/16 1000  Gross per 24 hour  Intake              480 ml  Output                0 ml  Net              480 ml   Filed Weights   09/14/16 2254 09/15/16 0230  Weight: 76.2 kg (168 lb) 73.9 kg (163 lb)    General appearance: alert, cooperative, appears stated age and no distress Resp: No wheezing heard today. Good  air entry bilaterally. No rhonchi. Normal effort.   Cardio: regular rate and rhythm, S1, S2 normal, no murmur, click, rub or gallop GI: soft, non-tender; bowel sounds normal; no masses,  no organomegaly Extremities: Minimal edema lower extremities. Neurologic: Awake and alert. Oriented 3. Cranial nerves II-12 intact. Motor strength is equal bilateral upper and lower extremities.  Lab Results:  Data Reviewed: I have personally reviewed following labs and imaging studies  CBC:  Recent Labs Lab 09/14/16 2338 09/15/16 0700 09/16/16 1622 09/17/16 0453 09/18/16 0446 09/19/16 0409 09/20/16 0438  WBC 0.6* 0.5* 1.2* 1.8* 2.4* 2.6* 2.2*  NEUTROABS 0.1* 0.1*  --   --  1.3* 1.4* 1.1*  HGB 8.5* 8.1* 8.9* 8.8* 8.6* 8.4* 8.0*  HCT 29.3* 27.8* 30.0* 29.6* 28.7* 27.6* 26.7*  MCV 99.0 99.6 97.1 96.4 93.5 94.8 94.0  PLT 142* 90* 112* 82* 102* 100* 87*    Basic Metabolic Panel:  Recent Labs Lab 09/14/16 2338 09/15/16 0700 09/17/16 0453 09/18/16 0446 09/20/16 0438  NA 139 135 137 137 136  K 3.6 3.5 3.7 3.2* 3.9  CL 105 104 104 102 104  CO2 _0 GLUCOSE 100* 104* 106* 119* 119*  BUN 21* 21* 21* 16 19  CREATININE 1.21 1.22 1.10 0.99 1.09  CALCIUM 8.3* 8.2*  8.0* 7.9* 7.9*    GFR: Estimated Creatinine Clearance: 53.1 mL/min (by C-G formula based on SCr of 1.09 mg/dL).  Liver Function Tests:  Recent Labs Lab 09/14/16 2338 09/15/16 0700 09/18/16 0446  AST 40 38 59*  ALT 34 30 32  ALKPHOS 54 45 53  BILITOT 1.9* 2.0* 1.6*  PROT 6.8 5.9* 6.0*  ALBUMIN 4.2 3.5 3.1*    Cardiac Enzymes:  Recent Labs Lab 09/13/16 1601  TROPONINI <0.03      Results for orders placed or performed during the hospital encounter of 09/14/16  Blood Culture (routine x 2)     Status: Abnormal   Collection Time: 09/14/16 11:42 PM  Result Value Ref Range Status   Specimen Description BLOOD RIGHT ANTECUBITAL  Final   Special Requests BOTTLES DRAWN AEROBIC AND ANAEROBIC 5ML EA  Final    Culture  Setup Time   Final    IN BOTH AEROBIC AND ANAEROBIC BOTTLES GRAM POSITIVE COCCI CRITICAL RESULT CALLED TO, READ BACK BY AND VERIFIED WITH: T. GREEN, PHARMD ([place]) AT 2149 ON 09/15/16 BY C. JESSUP, MLT. Performed at [place] (A)  Final   Report Status 09/17/2016 FINAL  Final   Organism ID, Bacteria STAPHYLOCOCCUS AUREUS  Final      Susceptibility   Staphylococcus aureus - MIC*    CIPROFLOXACIN >=8 RESISTANT Resistant     ERYTHROMYCIN >=8 RESISTANT Resistant     GENTAMICIN <=0.5 SENSITIVE Sensitive     OXACILLIN 0.5 SENSITIVE Sensitive     TETRACYCLINE <=1 SENSITIVE Sensitive     VANCOMYCIN 1 SENSITIVE Sensitive     TRIMETH/SULFA <=10 SENSITIVE Sensitive     CLINDAMYCIN <=0.25 SENSITIVE Sensitive     RIFAMPIN <=0.5 SENSITIVE Sensitive     Inducible Clindamycin NEGATIVE Sensitive     * STAPHYLOCOCCUS AUREUS  Blood Culture ID Panel (Reflexed)     Status: Abnormal   Collection Time: 09/14/16 11:42 PM  Result Value Ref Range Status   Enterococcus species NOT DETECTED NOT DETECTED Final   Listeria monocytogenes NOT DETECTED NOT DETECTED Final   Staphylococcus species DETECTED (A) NOT DETECTED Final    Comment: CRITICAL RESULT CALLED TO, READ BACK BY AND VERIFIED WITH: T. GREEN, PHARMD ([place]) AT 2149 ON 09/15/16 BY C. JESSUP, MLT.    Staphylococcus aureus DETECTED (A) NOT DETECTED Final    Comment: CRITICAL RESULT CALLED TO, READ BACK BY AND VERIFIED WITH: T. GREEN, PHARMD ([place]) AT 2149 ON 09/15/16 BY C. JESSUP, MLT.    Methicillin resistance NOT DETECTED NOT DETECTED Final   Streptococcus species NOT DETECTED NOT DETECTED Final   Streptococcus agalactiae NOT DETECTED NOT DETECTED Final   Streptococcus pneumoniae NOT DETECTED NOT DETECTED Final   Streptococcus pyogenes NOT DETECTED NOT DETECTED Final   Acinetobacter baumannii NOT DETECTED NOT DETECTED Final   Enterobacteriaceae species NOT DETECTED NOT DETECTED Final   Enterobacter  cloacae complex NOT DETECTED NOT DETECTED Final   Escherichia coli NOT DETECTED NOT DETECTED Final   Klebsiella oxytoca NOT DETECTED NOT DETECTED Final   Klebsiella pneumoniae NOT DETECTED NOT DETECTED Final   Proteus species NOT DETECTED NOT DETECTED Final   Serratia marcescens NOT DETECTED NOT DETECTED Final   Haemophilus influenzae NOT DETECTED NOT DETECTED Final   Neisseria meningitidis NOT DETECTED NOT DETECTED Final   Pseudomonas aeruginosa NOT DETECTED NOT DETECTED Final   Candida albicans NOT DETECTED NOT DETECTED Final   Candida glabrata NOT DETECTED NOT DETECTED Final   Candida krusei NOT  DETECTED NOT DETECTED Final   Candida parapsilosis NOT DETECTED NOT DETECTED Final   Candida tropicalis NOT DETECTED NOT DETECTED Final    Comment: Performed at [facility]  Blood Culture (routine x 2)     Status: Abnormal   Collection Time: 09/14/16 11:58 PM  Result Value Ref Range Status   Specimen Description BLOOD BLOOD RIGHT HAND  Final   Special Requests IN PEDIATRIC BOTTLE 2ML  Final   Culture  Setup Time   Final    GRAM POSITIVE COCCI IN CLUSTERS AEROBIC BOTTLE ONLY CRITICAL VALUE NOTED.  VALUE IS CONSISTENT WITH PREVIOUSLY REPORTED AND CALLED VALUE.    Culture (A)  Final    STAPHYLOCOCCUS AUREUS SUSCEPTIBILITIES PERFORMED ON PREVIOUS CULTURE WITHIN THE LAST 5 DAYS. Performed at [facility]    Report Status 09/17/2016 FINAL  Final  Urine culture     Status: None   Collection Time: 09/15/16 12:01 AM  Result Value Ref Range Status   Specimen Description URINE, RANDOM  Final   Special Requests NONE  Final   Culture   Final    NO GROWTH 1 DAY Performed at [facility]    Report Status 09/16/2016 FINAL  Final  Culture, blood (routine x 2)     Status: None (Preliminary result)   Collection Time: 09/17/16  4:50 AM  Result Value Ref Range Status   Specimen Description BLOOD LEFT ARM  Final   Special Requests BOTTLES DRAWN AEROBIC AND ANAEROBIC 5CC EACH   Final   Culture   Final    NO GROWTH 2 DAYS Performed at [facility]    Report Status PENDING  Incomplete  Culture, blood (routine x 2)     Status: None (Preliminary result)   Collection Time: 09/17/16  4:55 AM  Result Value Ref Range Status   Specimen Description BLOOD LEFT HAND  Final   Special Requests BOTTLES DRAWN AEROBIC ONLY 5CC  Final   Culture   Final    NO GROWTH 2 DAYS Performed at [facility]    Report Status PENDING  Incomplete     Radiology Studies: No results found.   Medications:  Scheduled: . sodium chloride  250 mL Intravenous Once  . sodium chloride   Intravenous Once  . acetaminophen  650 mg Oral Once  . calcium carbonate  1 tablet Oral QHS  .  ceFAZolin (ANCEF) IV  2 g Intravenous Q8H  . chlorhexidine  5 mL Mouth/Throat BID  . diphenhydrAMINE  25 mg Oral Once  . irbesartan  37.5 mg Oral Daily  . multivitamin with minerals  1 tablet Oral Daily  . pantoprazole  40 mg Oral Daily  . polyethylene glycol  17 g Oral Daily  . posaconazole  300 mg Oral QHS  . 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 Infectious disease is following. Cultures have been repeated 10/21 which have not shown  any growth so far. Patient is currently on Ancef. Vancomycin and  cefepime were discontinued. Tamiflu was also discontinued. Influenza PCR was negative. Source of this infection is not entirely clear, although patient did have cellulitis involving his left ankle area recently. MRI of the ankle did not show any obvious abscess. No evidence for osteomyelitis. Nonspecific tendon changes were noted. Patient recalls ankle injury remotely. No recent injury. Plan is for TEE today. Continue patient's home dose of posaconazole and valacyclovir. PICC line ordered by ID.  Myelodysplastic syndrome with pancytopenia Being followed by Dr. Irene Limbo and also at [facility] for possible stem cell transplant. Patient underwent bone marrow biopsy earlier today. Continue to monitor CBC. Neutrophil counts appear to have improved. He did receive 1 unit of blood ordered by his hematologist. Platelet counts noted to be low but stable. No evidence for bleeding. Continue to hold Lovenox.  Recently admitted for fluid overload Most likely from chemotherapy related. Echocardiogram showed preserved LV function with no diastolic dysfunction. He appears to be quite euvolemic at this time. BNP is normal. Okay to hold his Lasix for now. Keep legs elevated.  Dyspnea in the setting of known COPD Continues to have on and off cough with what appears to be pleuritic chest pain. Chest x-ray was repeated. Raised concern for mild vascular congestion and interstitial edema. BNP was normal. Findings are likely infectious. He was also given nebulizer treatments with improvement. Had a recent CT scan which is negative for PE. No concerning changes identified on EKG. patient continues to have respiratory symptoms. His son, who is a pulmonologist, is asking whether patient may benefit from being seen by pulmonologist and consider bronchoscopy, especially considering his symptoms and persistent fever. This may be reasonable. Discussed with Dr. Lake Bells, who will see the patient.  Hypokalemia Repleted.   DVT  Prophylaxis: Changed Lovenox to SCDs due to low platelets. Code Status: Full code  Family Communication: Discussed with the patient and his wife. Disposition Plan: Continue management as outlined above. Plan is for TEE and bone marrow biopsy today. Pulmonology consulted.    LOS: 5 days   [facility] Pager [phone] 09/20/2016, 10:29 AM  If 7PM-7AM, please contact night-coverage at [email or web address], password [facility]

## 2016-09-20 NOTE — Progress Notes (Signed)
Obtained regular diet order via phone from Dr. Maryland Pink following CT of chest.

## 2016-09-20 NOTE — H&P (View-Only) (Signed)
[facility] PROGRESS NOTE  Warren [name] BZJ:[id number] DOB: [birth year] DOA: 09/14/2016  PCP: Wenda Low, MD  Brief History/Interval Summary: Warren [name] is a 75 y.o. male with MDS who was admitted recently for shortness of breath and respiratory failure with fluid overload suspected to be secondary to chemotherapy. Patient was discharged home on 10/18 and then subsequently developed fever and chills at home and so he came back to the hospital. Patient found to have neutropenia. He was hospitalized for further management. Blood Cultures returned positive for staph aureus. Patient to undergo bone marrow biopsy and TEE 10/24.  Reason for Visit: Neutropenic fever  Consultants: Oncology. Infectious disease. Interventional radiology. Pulmonology  Procedures: Bone marrow biopsy and TEE is scheduled for 10/24  Antibiotics: Vancomycin, cefepime , stopped on 10/20 Tamiflu initiated 10/19. Stopped on 10/20. Cefazolin 10/20  Subjective/Interval History: Patient feels well. Still gets dyspneic at times and complains of cough. Occasional wheezing. Denies any nausea or vomiting. His wife is at the bedside.  ROS: Denies any nausea, vomiting, abdominal pain, diarrhea.   Objective:  Vital Signs  Vitals:   09/20/16 0609 09/20/16 0815 09/20/16 0925 09/20/16 1000  BP: (!) 151/74  (!) 142/67 136/72  Pulse: 89  87   Resp: 18     Temp: (!) 101.5 F (38.6 C) 99.7 F (37.6 C) 99.7 F (37.6 C) 98.8 F (37.1 C)  TempSrc: Oral Oral Oral Oral  SpO2: 94% 93%  95%  Weight:      Height:        Intake/Output Summary (Last 24 hours) at 09/20/16 1029 Last data filed at 09/20/16 1000  Gross per 24 hour  Intake              480 ml  Output                0 ml  Net              480 ml   Filed Weights   09/14/16 2254 09/15/16 0230  Weight: 76.2 kg (168 lb) 73.9 kg (163 lb)    General appearance: alert, cooperative, appears stated age and no distress Resp: No wheezing heard today. Good  air entry bilaterally. No rhonchi. Normal effort.   Cardio: regular rate and rhythm, S1, S2 normal, no murmur, click, rub or gallop GI: soft, non-tender; bowel sounds normal; no masses,  no organomegaly Extremities: Minimal edema lower extremities. Neurologic: Awake and alert. Oriented 3. Cranial nerves II-12 intact. Motor strength is equal bilateral upper and lower extremities.  Lab Results:  Data Reviewed: I have personally reviewed following labs and imaging studies  CBC:  Recent Labs Lab 09/14/16 2338 09/15/16 0700 09/16/16 1622 09/17/16 0453 09/18/16 0446 09/19/16 0409 09/20/16 0438  WBC 0.6* 0.5* 1.2* 1.8* 2.4* 2.6* 2.2*  NEUTROABS 0.1* 0.1*  --   --  1.3* 1.4* 1.1*  HGB 8.5* 8.1* 8.9* 8.8* 8.6* 8.4* 8.0*  HCT 29.3* 27.8* 30.0* 29.6* 28.7* 27.6* 26.7*  MCV 99.0 99.6 97.1 96.4 93.5 94.8 94.0  PLT 142* 90* 112* 82* 102* 100* 87*    Basic Metabolic Panel:  Recent Labs Lab 09/14/16 2338 09/15/16 0700 09/17/16 0453 09/18/16 0446 09/20/16 0438  NA 139 135 137 137 136  K 3.6 3.5 3.7 3.2* 3.9  CL 105 104 104 102 104  CO2 _0 GLUCOSE 100* 104* 106* 119* 119*  BUN 21* 21* 21* 16 19  CREATININE 1.21 1.22 1.10 0.99 1.09  CALCIUM 8.3* 8.2*  8.0* 7.9* 7.9*    GFR: Estimated Creatinine Clearance: 53.1 mL/min (by C-G formula based on SCr of 1.09 mg/dL).  Liver Function Tests:  Recent Labs Lab 09/14/16 2338 09/15/16 0700 09/18/16 0446  AST 40 38 59*  ALT 34 30 32  ALKPHOS 54 45 53  BILITOT 1.9* 2.0* 1.6*  PROT 6.8 5.9* 6.0*  ALBUMIN 4.2 3.5 3.1*    Cardiac Enzymes:  Recent Labs Lab 09/13/16 1601  TROPONINI <0.03      Results for orders placed or performed during the hospital encounter of 09/14/16  Blood Culture (routine x 2)     Status: Abnormal   Collection Time: 09/14/16 11:42 PM  Result Value Ref Range Status   Specimen Description BLOOD RIGHT ANTECUBITAL  Final   Special Requests BOTTLES DRAWN AEROBIC AND ANAEROBIC 5ML EA  Final    Culture  Setup Time   Final    IN BOTH AEROBIC AND ANAEROBIC BOTTLES GRAM POSITIVE COCCI CRITICAL RESULT CALLED TO, READ BACK BY AND VERIFIED WITH: T. GREEN, PHARMD ([facility]) AT 2149 ON 09/15/16 BY C. JESSUP, MLT. Performed at [facility] (A)  Final   Report Status 09/17/2016 FINAL  Final   Organism ID, Bacteria STAPHYLOCOCCUS AUREUS  Final      Susceptibility   Staphylococcus aureus - MIC*    CIPROFLOXACIN >=8 RESISTANT Resistant     ERYTHROMYCIN >=8 RESISTANT Resistant     GENTAMICIN <=0.5 SENSITIVE Sensitive     OXACILLIN 0.5 SENSITIVE Sensitive     TETRACYCLINE <=1 SENSITIVE Sensitive     VANCOMYCIN 1 SENSITIVE Sensitive     TRIMETH/SULFA <=10 SENSITIVE Sensitive     CLINDAMYCIN <=0.25 SENSITIVE Sensitive     RIFAMPIN <=0.5 SENSITIVE Sensitive     Inducible Clindamycin NEGATIVE Sensitive     * STAPHYLOCOCCUS AUREUS  Blood Culture ID Panel (Reflexed)     Status: Abnormal   Collection Time: 09/14/16 11:42 PM  Result Value Ref Range Status   Enterococcus species NOT DETECTED NOT DETECTED Final   Listeria monocytogenes NOT DETECTED NOT DETECTED Final   Staphylococcus species DETECTED (A) NOT DETECTED Final    Comment: CRITICAL RESULT CALLED TO, READ BACK BY AND VERIFIED WITH: T. GREEN, PHARMD ([facility]) AT 2149 ON 09/15/16 BY C. JESSUP, MLT.    Staphylococcus aureus DETECTED (A) NOT DETECTED Final    Comment: CRITICAL RESULT CALLED TO, READ BACK BY AND VERIFIED WITH: T. GREEN, PHARMD ([facility]) AT 2149 ON 09/15/16 BY C. JESSUP, MLT.    Methicillin resistance NOT DETECTED NOT DETECTED Final   Streptococcus species NOT DETECTED NOT DETECTED Final   Streptococcus agalactiae NOT DETECTED NOT DETECTED Final   Streptococcus pneumoniae NOT DETECTED NOT DETECTED Final   Streptococcus pyogenes NOT DETECTED NOT DETECTED Final   Acinetobacter baumannii NOT DETECTED NOT DETECTED Final   Enterobacteriaceae species NOT DETECTED NOT DETECTED Final   Enterobacter  cloacae complex NOT DETECTED NOT DETECTED Final   Escherichia coli NOT DETECTED NOT DETECTED Final   Klebsiella oxytoca NOT DETECTED NOT DETECTED Final   Klebsiella pneumoniae NOT DETECTED NOT DETECTED Final   Proteus species NOT DETECTED NOT DETECTED Final   Serratia marcescens NOT DETECTED NOT DETECTED Final   Haemophilus influenzae NOT DETECTED NOT DETECTED Final   Neisseria meningitidis NOT DETECTED NOT DETECTED Final   Pseudomonas aeruginosa NOT DETECTED NOT DETECTED Final   Candida albicans NOT DETECTED NOT DETECTED Final   Candida glabrata NOT DETECTED NOT DETECTED Final   Candida krusei NOT  DETECTED NOT DETECTED Final   Candida parapsilosis NOT DETECTED NOT DETECTED Final   Candida tropicalis NOT DETECTED NOT DETECTED Final    Comment: Performed at [facility]  Blood Culture (routine x 2)     Status: Abnormal   Collection Time: 09/14/16 11:58 PM  Result Value Ref Range Status   Specimen Description BLOOD BLOOD RIGHT HAND  Final   Special Requests IN PEDIATRIC BOTTLE 2ML  Final   Culture  Setup Time   Final    GRAM POSITIVE COCCI IN CLUSTERS AEROBIC BOTTLE ONLY CRITICAL VALUE NOTED.  VALUE IS CONSISTENT WITH PREVIOUSLY REPORTED AND CALLED VALUE.    Culture (A)  Final    STAPHYLOCOCCUS AUREUS SUSCEPTIBILITIES PERFORMED ON PREVIOUS CULTURE WITHIN THE LAST 5 DAYS. Performed at [facility]    Report Status 09/17/2016 FINAL  Final  Urine culture     Status: None   Collection Time: 09/15/16 12:01 AM  Result Value Ref Range Status   Specimen Description URINE, RANDOM  Final   Special Requests NONE  Final   Culture   Final    NO GROWTH 1 DAY Performed at [facility]    Report Status 09/16/2016 FINAL  Final  Culture, blood (routine x 2)     Status: None (Preliminary result)   Collection Time: 09/17/16  4:50 AM  Result Value Ref Range Status   Specimen Description BLOOD LEFT ARM  Final   Special Requests BOTTLES DRAWN AEROBIC AND ANAEROBIC 5CC EACH   Final   Culture   Final    NO GROWTH 2 DAYS Performed at [facility]    Report Status PENDING  Incomplete  Culture, blood (routine x 2)     Status: None (Preliminary result)   Collection Time: 09/17/16  4:55 AM  Result Value Ref Range Status   Specimen Description BLOOD LEFT HAND  Final   Special Requests BOTTLES DRAWN AEROBIC ONLY 5CC  Final   Culture   Final    NO GROWTH 2 DAYS Performed at [facility]    Report Status PENDING  Incomplete     Radiology Studies: No results found.   Medications:  Scheduled: . sodium chloride  250 mL Intravenous Once  . sodium chloride   Intravenous Once  . acetaminophen  650 mg Oral Once  . calcium carbonate  1 tablet Oral QHS  .  ceFAZolin (ANCEF) IV  2 g Intravenous Q8H  . chlorhexidine  5 mL Mouth/Throat BID  . diphenhydrAMINE  25 mg Oral Once  . irbesartan  37.5 mg Oral Daily  . multivitamin with minerals  1 tablet Oral Daily  . pantoprazole  40 mg Oral Daily  . polyethylene glycol  17 g Oral Daily  . posaconazole  300 mg Oral QHS  . 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 Infectious disease is following. Cultures have been repeated 10/21 which have not shown  any growth so far. Patient is currently on Ancef. Vancomycin and  cefepime were discontinued. Tamiflu was also discontinued. Influenza PCR was negative. Source of this infection is not entirely clear, although patient did have cellulitis involving his left ankle area recently. MRI of the ankle did not show any obvious abscess. No evidence for osteomyelitis. Nonspecific tendon changes were noted. Patient recalls ankle injury remotely. No recent injury. Plan is for TEE today. Continue patient's home dose of posaconazole and valacyclovir. PICC line ordered by ID.  Myelodysplastic syndrome with pancytopenia Being followed by Dr. Irene Limbo and also at [facility] for possible stem cell transplant. Patient underwent bone marrow biopsy earlier today. Continue to monitor CBC. Neutrophil counts appear to have improved. He did receive 1 unit of blood ordered by his hematologist. Platelet counts noted to be low but stable. No evidence for bleeding. Continue to hold Lovenox.  Recently admitted for fluid overload Most likely from chemotherapy related. Echocardiogram showed preserved LV function with no diastolic dysfunction. He appears to be quite euvolemic at this time. BNP is normal. Okay to hold his Lasix for now. Keep legs elevated.  Dyspnea in the setting of known COPD Continues to have on and off cough with what appears to be pleuritic chest pain. Chest x-ray was repeated. Raised concern for mild vascular congestion and interstitial edema. BNP was normal. Findings are likely infectious. He was also given nebulizer treatments with improvement. Had a recent CT scan which is negative for PE. No concerning changes identified on EKG. patient continues to have respiratory symptoms. His son, who is a pulmonologist, is asking whether patient may benefit from being seen by pulmonologist and consider bronchoscopy, especially considering his symptoms and persistent fever. This may be reasonable. Discussed with Dr. Lake Bells, who will see the patient.  Hypokalemia Repleted.   DVT  Prophylaxis: Changed Lovenox to SCDs due to low platelets. Code Status: Full code  Family Communication: Discussed with the patient and his wife. Disposition Plan: Continue management as outlined above. Plan is for TEE and bone marrow biopsy today. Pulmonology consulted.    LOS: 5 days   [facility] Pager [phone] 09/20/2016, 10:29 AM  If 7PM-7AM, please contact night-coverage at [email or web address], password [facility]

## 2016-09-20 NOTE — Progress Notes (Signed)
Report called to [facility] and given to [facility], RN regarding pt status. [facility] picked pt up at 1330 and is en route to [facility] for procedure.

## 2016-09-20 NOTE — Progress Notes (Signed)
Peripherally Inserted Central Catheter/Midline Placement  The IV Nurse has discussed with the patient and/or persons authorized to consent for the patient, the purpose of this procedure and the potential benefits and risks involved with this procedure.  The benefits include less needle sticks, lab draws from the catheter, and the patient may be discharged home with the catheter. Risks include, but not limited to, infection, bleeding, blood clot (thrombus formation), and puncture of an artery; nerve damage and irregular heartbeat and possibility to perform a PICC exchange if needed/ordered by physician.  Alternatives to this procedure were also discussed.  B[employer] PICC patient education guide, fact sheet on infection prevention and patient information card has been provided to patient /or left at bedside.    PICC/Midline Placement Documentation        Warren [name] 09/20/2016, 11:53 AM

## 2016-09-20 NOTE — Progress Notes (Signed)
Rapid Response Event Note  Overview:  Called to room by RN for patient having "gasping breathing" while sleeping. Also, patient noted to desat to 70s-80s while sleeping. RN called rapid response and applied 2L [place]. RN reports patient had multiple procedures throughout the day and received fentanyl as sedation.  Pt has OSA per history. Pt has CPAP at home but refuses to wear. Says he does not want to wear at hospital either.  Initial Focused Assessment: Upon entering the room patient alert and can answer all orientation questions.  VSS - 148/97, P - 82 NSR, RR - 18, O2 sat - 99% on 2L. Lung sounds clear except fine crackle in RLL. Pt reports dyspnea with exertion, none at rest.   Interventions:  Continue 2L [place] and monitor. Patient on continuous pulse ox.   Plan of Care (if not transferred):  Continue O2 and continue pulse ox, Chest CT ordered prior to event - obtaining now. Made MD aware of RRT call and stable assessment. RN instructed to call RRT if patient continues to desat or new symptoms develop.   Event Summary:  Stable assessment, call RRT if needed.         Kathie Rhodes Verita Lamb

## 2016-09-20 NOTE — Progress Notes (Signed)
Pt having gasping,snoring respirations intermittently while sleeping since back from biopsy earlier today. Sats were stable in low to mid 90's. Pt returned from [facility] via [facility] post TEE at [place]. [facility] nurse reported continued grunting, gasping resp when sleeping with occasional drops in sats into lower 80's. Pt wakes clear and oriented. O2 at 2L via cannula since arrival with O2 sats at 94-97%. Rapid Response nurse called and currently in to assess pt's irregular breathing when sleeping. No distress noted although tachypnea noted at times.

## 2016-09-21 ENCOUNTER — Encounter (HOSPITAL_COMMUNITY): Payer: Self-pay | Admitting: Cardiology

## 2016-09-21 ENCOUNTER — Encounter: Payer: Self-pay | Admitting: *Deleted

## 2016-09-21 DIAGNOSIS — R05 Cough: Secondary | ICD-10-CM

## 2016-09-21 LAB — CBC WITH DIFFERENTIAL/PLATELET
Basophils Absolute: 0 10*3/uL (ref 0.0–0.1)
Basophils Relative: 0 %
EOS ABS: 0 10*3/uL (ref 0.0–0.7)
EOS PCT: 0 %
HCT: 23.7 % — ABNORMAL LOW (ref 39.0–52.0)
Hemoglobin: 7.1 g/dL — ABNORMAL LOW (ref 13.0–17.0)
Lymphocytes Relative: 47 %
Lymphs Abs: 1 10*3/uL (ref 0.7–4.0)
MCH: 28.5 pg (ref 26.0–34.0)
MCHC: 30 g/dL (ref 30.0–36.0)
MCV: 95.2 fL (ref 78.0–100.0)
MONO ABS: 0.2 10*3/uL (ref 0.1–1.0)
Monocytes Relative: 8 %
NEUTROS PCT: 45 %
Neutro Abs: 0.9 10*3/uL — ABNORMAL LOW (ref 1.7–7.7)
PLATELETS: 87 10*3/uL — AB (ref 150–400)
RBC: 2.49 MIL/uL — AB (ref 4.22–5.81)
RDW: 19.4 % — AB (ref 11.5–15.5)
WBC: 2.1 10*3/uL — AB (ref 4.0–10.5)

## 2016-09-21 LAB — BASIC METABOLIC PANEL
ANION GAP: 9 (ref 5–15)
BUN: 19 mg/dL (ref 6–20)
CALCIUM: 7.8 mg/dL — AB (ref 8.9–10.3)
CO2: 26 mmol/L (ref 22–32)
Chloride: 103 mmol/L (ref 101–111)
Creatinine, Ser: 0.94 mg/dL (ref 0.61–1.24)
Glucose, Bld: 114 mg/dL — ABNORMAL HIGH (ref 65–99)
Potassium: 3.6 mmol/L (ref 3.5–5.1)
SODIUM: 138 mmol/L (ref 135–145)

## 2016-09-21 NOTE — Progress Notes (Signed)
PT Cancellation Note  Patient Details Name: GWEN STOCKERT MRN: [id number] DOB: [birth year]   Cancelled Treatment:     will hold PT for today due to new onset respiratory issue.  Pt stated "its all I can do just to get to the bathroom".  Will check back as schedule permits.    Rica Koyanagi  PTA [facility]  Acute  Rehab Pager      [phone]

## 2016-09-21 NOTE — Progress Notes (Signed)
[facility] PROGRESS NOTE  BAILY SERPE IRJ:[id number] DOB: Apr 07, 1941 DOA: 09/14/2016  PCP: Wenda Low, MD  Brief History/Interval Summary: Warren [name] is a 75 y.o. male with MDS who was admitted recently for shortness of breath and respiratory failure with fluid overload suspected to be secondary to chemotherapy. Patient was discharged home on 10/18 and then subsequently developed fever and chills at home and so he came back to the hospital. Patient found to have neutropenia. He was hospitalized for further management. Blood Cultures returned positive for staph aureus. Patient to undergo bone marrow biopsy and TEE 10/24.  Reason for Visit: Neutropenic fever  Consultants: Oncology. Infectious disease. Interventional radiology. Pulmonology  Procedures: Bone marrow biopsy and TEE is scheduled for 10/24  Antibiotics: Vancomycin, cefepime , stopped on 10/20 Tamiflu initiated 10/19. Stopped on 10/20. Cefazolin 10/20  Subjective/Interval History: Patient has no new complaints.  ROS: Denies any nausea, vomiting, abdominal pain, diarrhea.   Objective:  Vital Signs  Vitals:   09/21/16 0531 09/21/16 0745 09/21/16 1339 09/21/16 1430  BP: 133/70   135/69  Pulse: 83 81  78  Resp: 19   (!) 22  Temp: 99.6 F (37.6 C)  99.1 F (37.3 C) 99.3 F (37.4 C)  TempSrc: Oral  Oral Oral  SpO2: 95% 93%  96%  Weight:      Height:        Intake/Output Summary (Last 24 hours) at 09/21/16 1653 Last data filed at 09/21/16 1502  Gross per 24 hour  Intake              600 ml  Output              250 ml  Net              350 ml   Filed Weights   09/14/16 2254 09/15/16 0230  Weight: 76.2 kg (168 lb) 73.9 kg (163 lb)    General appearance: Awake and alert, in no acute distress Resp: No wheezing heard today. Good air entry bilaterally. No rhonchi. Normal effort.   Cardio: regular rate and rhythm, S1, S2 normal, no murmur, click, rub or gallop GI: soft, non-tender; bowel sounds  normal; no masses,  no organomegaly Extremities: Minimal edema lower extremities. Neurologic: Awake and alert. Oriented 3. Cranial nerves II-12 intact. Motor strength is equal bilateral upper and lower extremities.  Lab Results:  Data Reviewed: I have personally reviewed following labs and imaging studies  CBC:  Recent Labs Lab 09/15/16 0700  09/17/16 0453 09/18/16 0446 09/19/16 0409 09/20/16 0438 09/21/16 0430  WBC 0.5*  < > 1.8* 2.4* 2.6* 2.2* 2.1*  NEUTROABS 0.1*  --   --  1.3* 1.4* 1.1* 0.9*  HGB 8.1*  < > 8.8* 8.6* 8.4* 8.0* 7.1*  HCT 27.8*  < > 29.6* 28.7* 27.6* 26.7* 23.7*  MCV 99.6  < > 96.4 93.5 94.8 94.0 95.2  PLT 90*  < > 82* 102* 100* 87* 87*  < > = values in this interval not displayed.  Basic Metabolic Panel:  Recent Labs Lab 09/15/16 0700 09/17/16 0453 09/18/16 0446 09/20/16 0438 09/21/16 0430  NA 135 137 137 136 138  K 3.5 3.7 3.2* 3.9 3.6  CL 104 104 102 104 103  CO2 _0 GLUCOSE 104* 106* 119* 119* 114*  BUN 21* 21* _1 CREATININE 1.22 1.10 0.99 1.09 0.94  CALCIUM 8.2* 8.0* 7.9* 7.9* 7.8*    GFR: Estimated Creatinine Clearance: 61.5  mL/min (by C-G formula based on SCr of 0.94 mg/dL).  Liver Function Tests:  Recent Labs Lab 09/14/16 2338 09/15/16 0700 09/18/16 0446  AST 40 38 59*  ALT 34 30 32  ALKPHOS 54 45 53  BILITOT 1.9* 2.0* 1.6*  PROT 6.8 5.9* 6.0*  ALBUMIN 4.2 3.5 3.1*    Cardiac Enzymes: No results for input(s): CKTOTAL, CKMB, CKMBINDEX, TROPONINI in the last 168 hours.    Results for orders placed or performed during the hospital encounter of 09/14/16  Blood Culture (routine x 2)     Status: Abnormal   Collection Time: 09/14/16 11:42 PM  Result Value Ref Range Status   Specimen Description BLOOD RIGHT ANTECUBITAL  Final   Special Requests BOTTLES DRAWN AEROBIC AND ANAEROBIC 5ML EA  Final   Culture  Setup Time   Final    IN BOTH AEROBIC AND ANAEROBIC BOTTLES GRAM POSITIVE COCCI CRITICAL RESULT  CALLED TO, READ BACK BY AND VERIFIED WITH: T. GREEN, PHARMD ([place]) AT 2149 ON 09/15/16 BY C. JESSUP, MLT. Performed at [facility] (A)  Final   Report Status 09/17/2016 FINAL  Final   Organism ID, Bacteria STAPHYLOCOCCUS AUREUS  Final      Susceptibility   Staphylococcus aureus - MIC*    CIPROFLOXACIN >=8 RESISTANT Resistant     ERYTHROMYCIN >=8 RESISTANT Resistant     GENTAMICIN <=0.5 SENSITIVE Sensitive     OXACILLIN 0.5 SENSITIVE Sensitive     TETRACYCLINE <=1 SENSITIVE Sensitive     VANCOMYCIN 1 SENSITIVE Sensitive     TRIMETH/SULFA <=10 SENSITIVE Sensitive     CLINDAMYCIN <=0.25 SENSITIVE Sensitive     RIFAMPIN <=0.5 SENSITIVE Sensitive     Inducible Clindamycin NEGATIVE Sensitive     * STAPHYLOCOCCUS AUREUS  Blood Culture ID Panel (Reflexed)     Status: Abnormal   Collection Time: 09/14/16 11:42 PM  Result Value Ref Range Status   Enterococcus species NOT DETECTED NOT DETECTED Final   Listeria monocytogenes NOT DETECTED NOT DETECTED Final   Staphylococcus species DETECTED (A) NOT DETECTED Final    Comment: CRITICAL RESULT CALLED TO, READ BACK BY AND VERIFIED WITH: T. GREEN, PHARMD ([facility]) AT 2149 ON 09/15/16 BY C. JESSUP, MLT.    Staphylococcus aureus DETECTED (A) NOT DETECTED Final    Comment: CRITICAL RESULT CALLED TO, READ BACK BY AND VERIFIED WITH: T. GREEN, PHARMD ([place]) AT 2149 ON 09/15/16 BY C. JESSUP, MLT.    Methicillin resistance NOT DETECTED NOT DETECTED Final   Streptococcus species NOT DETECTED NOT DETECTED Final   Streptococcus agalactiae NOT DETECTED NOT DETECTED Final   Streptococcus pneumoniae NOT DETECTED NOT DETECTED Final   Streptococcus pyogenes NOT DETECTED NOT DETECTED Final   Acinetobacter baumannii NOT DETECTED NOT DETECTED Final   Enterobacteriaceae species NOT DETECTED NOT DETECTED Final   Enterobacter cloacae complex NOT DETECTED NOT DETECTED Final   Escherichia coli NOT DETECTED NOT DETECTED Final    Klebsiella oxytoca NOT DETECTED NOT DETECTED Final   Klebsiella pneumoniae NOT DETECTED NOT DETECTED Final   Proteus species NOT DETECTED NOT DETECTED Final   Serratia marcescens NOT DETECTED NOT DETECTED Final   Haemophilus influenzae NOT DETECTED NOT DETECTED Final   Neisseria meningitidis NOT DETECTED NOT DETECTED Final   Pseudomonas aeruginosa NOT DETECTED NOT DETECTED Final   Candida albicans NOT DETECTED NOT DETECTED Final   Candida glabrata NOT DETECTED NOT DETECTED Final   Candida krusei NOT DETECTED NOT DETECTED Final   Candida parapsilosis NOT  DETECTED NOT DETECTED Final   Candida tropicalis NOT DETECTED NOT DETECTED Final    Comment: Performed at [facility]  Blood Culture (routine x 2)     Status: Abnormal   Collection Time: 09/14/16 11:58 PM  Result Value Ref Range Status   Specimen Description BLOOD BLOOD RIGHT HAND  Final   Special Requests IN PEDIATRIC BOTTLE 2ML  Final   Culture  Setup Time   Final    GRAM POSITIVE COCCI IN CLUSTERS AEROBIC BOTTLE ONLY CRITICAL VALUE NOTED.  VALUE IS CONSISTENT WITH PREVIOUSLY REPORTED AND CALLED VALUE.    Culture (A)  Final    STAPHYLOCOCCUS AUREUS SUSCEPTIBILITIES PERFORMED ON PREVIOUS CULTURE WITHIN THE LAST 5 DAYS. Performed at [facility]    Report Status 09/17/2016 FINAL  Final  Urine culture     Status: None   Collection Time: 09/15/16 12:01 AM  Result Value Ref Range Status   Specimen Description URINE, RANDOM  Final   Special Requests NONE  Final   Culture   Final    NO GROWTH 1 DAY Performed at [facility]    Report Status 09/16/2016 FINAL  Final  Culture, blood (routine x 2)     Status: None (Preliminary result)   Collection Time: 09/17/16  4:50 AM  Result Value Ref Range Status   Specimen Description BLOOD LEFT ARM  Final   Special Requests BOTTLES DRAWN AEROBIC AND ANAEROBIC 5CC EACH  Final   Culture   Final    NO GROWTH 4 DAYS Performed at [facility]    Report Status  PENDING  Incomplete  Culture, blood (routine x 2)     Status: None (Preliminary result)   Collection Time: 09/17/16  4:55 AM  Result Value Ref Range Status   Specimen Description BLOOD LEFT HAND  Final   Special Requests BOTTLES DRAWN AEROBIC ONLY 5CC  Final   Culture   Final    NO GROWTH 4 DAYS Performed at [facility]    Report Status PENDING  Incomplete     Radiology Studies: Ct Chest Wo Contrast  Result Date: 09/21/2016 CLINICAL DATA:  Abnormal chest x-ray EXAM: CT CHEST WITHOUT CONTRAST TECHNIQUE: Multidetector CT imaging of the chest was performed following the standard protocol without IV contrast. COMPARISON:  Radiograph 09/17/2016, CTA chest 09/12/2016 FINDINGS: Cardiovascular: Limited without intravenous contrast. Right-sided central venous catheter tip terminates at the distal SVC. Scattered calcifications within the thoracic aorta ectasia of the ascending segment measuring 3.7 cm. , descending thoracic aorta measures 3.4 cm. There is cardiomegaly no significant interval change in small pericardial effusion. Mediastinum/Nodes: Mild mediastinal adenopathy. Pretracheal node measures 8 mm. Trachea and mainstem bronchi appear normal. Imaged thyroid gland within normal limits. Mild air distention of the esophagus. Lungs/Pleura: Interim development of small bilateral right greater than left pleural effusions. Calcified granuloma within the left lower lobe. Interim development of multifocal pulmonary nodules and spiculated soft tissue densities within the lower lobes, right middle lobe, and bilateral upper lobes. Upper Abdomen: Stable cyst in the liver. No acute abnormality in the upper abdomen. Musculoskeletal: No acute osseous abnormality. IMPRESSION: 1. Interim development of small bilateral pleural effusions. Interim development of multiple bilateral small pulmonary nodules an multifocal spiculated soft tissue densities throughout all lobes of the lung, many of which demonstrate  somewhat peripheral distribution. Differential considerations include septic emboli, multifocal foci of infection or pneumonitis, and unusual entity such as sarcoidosis. Neoplasm or metastatic foci remain in the differential but are felt less  likely given the rapidity of development of the findings. 2. Small pericardial effusion, unchanged. Electronically Signed   By: Donavan Foil M.D.   On: 09/21/2016 03:29   Ct Biopsy  Result Date: 09/20/2016 CLINICAL DATA:  Myelodysplastic syndrome and need for bone marrow biopsy. EXAM: CT GUIDED BONE MARROW ASPIRATION AND BIOPSY ANESTHESIA/SEDATION: The patient was not sedated. He did receive 125 mcg of IV fentanyl during the procedure. PROCEDURE: The procedure risks, benefits, and alternatives were explained to the patient. Questions regarding the procedure were encouraged and answered. The patient understands and consents to the procedure. A time out was performed prior to initiating the procedure. The right gluteal region was prepped with chlorhexidine. Sterile gown and sterile gloves were used for the procedure. Local anesthesia was provided with 1% Lidocaine. Under CT guidance, an 11 gauge On Control bone cutting needle was advanced from a posterior approach into the right iliac bone. Needle positioning was confirmed with CT. Initial non heparinized and heparinized aspirate samples were obtained of bone marrow. Core biopsy was performed via the On Control drill needle. COMPLICATIONS: None FINDINGS: Inspection of initial aspirate did reveal visible particles. Intact core biopsy sample was obtained. IMPRESSION: CT guided bone marrow biopsy of right posterior iliac bone with both aspirate and core samples obtained. Electronically Signed   By: Aletta Edouard M.D.   On: 09/20/2016 12:28     Medications:  Scheduled: . sodium chloride  250 mL Intravenous Once  . sodium chloride   Intravenous Once  . acetaminophen  650 mg Oral Once  . calcium carbonate  1 tablet  Oral QHS  .  ceFAZolin (ANCEF) IV  2 g Intravenous Q8H  . chlorhexidine  5 mL Mouth/Throat BID  . diphenhydrAMINE  25 mg Oral Once  . irbesartan  37.5 mg Oral Daily  . multivitamin with minerals  1 tablet Oral Daily  . pantoprazole  40 mg Oral Daily  . polyethylene glycol  17 g Oral Daily  . posaconazole  300 mg Oral QHS  . 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 Infectious disease is following. Cultures have been repeated 10/21 which have not shown any growth so far. Patient is currently on Ancef. Vancomycin and cefepime were discontinued. Tamiflu was also discontinued. Influenza PCR was negative. Source of this infection is not entirely clear, although patient did have cellulitis involving his left ankle area recently. MRI of the ankle did not show any obvious abscess. No evidence for osteomyelitis. Nonspecific tendon changes were noted. Patient recalls ankle injury remotely. No recent injury. patient is status post TEE. Continue patient's home dose of posaconazole and valacyclovir. PICC line ordered by ID.  Myelodysplastic syndrome with pancytopenia Being followed by Dr. Irene Limbo and also at [facility] for possible stem cell transplant. Patient underwent bone marrow biopsy earlier today. Continue to  monitor CBC. Neutrophil counts appear to have improved. He did receive 1 unit of blood  ordered by his hematologist. Platelet counts noted to be low but stable. No evidence for bleeding. Continue to hold Lovenox.  Recently admitted for fluid overload Most likely from chemotherapy related. Echocardiogram showed preserved LV function with no diastolic dysfunction. He appears to be quite euvolemic at this time. BNP is normal. Okay to hold his Lasix for now. Keep legs elevated.  Dyspnea in the setting of known COPD Pulmonary on board and has not recommended bronchoscopy.  Hypokalemia Repleted. And wnl on last check  DVT Prophylaxis: Changed Lovenox to SCDs due to low platelets. Code Status: Full code  Family Communication: Discussed with the patient and his wife. Disposition Plan: pending final recommendations from specialist involved.    LOS: 6 days   Velvet Bathe  [facility] Pager [phone] 09/21/2016, 4:53 PM  If 7PM-7AM, please contact night-coverage at [email or web address], password [facility]

## 2016-09-21 NOTE — Progress Notes (Signed)
Per ID, will require 4 weeks IV abx therapy. Contacted [facility] to make them aware.

## 2016-09-21 NOTE — Progress Notes (Signed)
Took patient sputum collection cup. Was eating and said he would try to get the sputum after he was finished.

## 2016-09-21 NOTE — Progress Notes (Signed)
[place] pt for [facility] this admission  [facility] will provide [facility] team for home IV ABX.  [facility] will follow pt until DC to home to ensure [facility] needs are met for transition home.   If patient discharges after hours, please call [phone].   Larry Sierras 09/21/2016, 1:51 PM

## 2016-09-21 NOTE — Progress Notes (Signed)
QI encounter 

## 2016-09-21 NOTE — Progress Notes (Signed)
Pt refused CPAP QHS.

## 2016-09-21 NOTE — Progress Notes (Signed)
Name: Warren [name] MRN: [id number] DOB: [birth year]    ADMISSION DATE:  09/14/2016 CONSULTATION DATE: 10/24  REFERRING MD :  [place] Pink   CHIEF COMPLAINT:  Dyspnea, immunocompromised. ? bronch  BRIEF PATIENT DESCRIPTION:  75 year old male w/ MDS, admitted w/ neutropenia MSSA bacteremia. PCCM asked to see on 10/24  Due to on-going fever and thoughts on bronchoscopy in this setting.   SIGNIFICANT EVENTS  Bone narrow biopsy 10/24>>>  STUDIES:  MR ankle: negative for osteo.  TEE 10/24>>> negative for vegetation CT chest 10/24 > multiple nodules in a bronchovascular distribution  BRIEF: 75 y/o male with months long neutropenia related to MDS and chemotherapy in with MSSA bacteremia.  PCCM consulted for evaluation of cough, ongoing fevers, dyspnea.  Subjective: Still producing brown sputum Has cough  VITAL SIGNS: Temp:  [99.2 F (37.3 C)-101.7 F (38.7 C)] 99.6 F (37.6 C) (10/25 0531) Pulse Rate:  [81-90] 81 (10/25 0745) Resp:  [15-31] 19 (10/25 0531) BP: (133-172)/(46-110) 133/70 (10/25 0531) SpO2:  [92 %-99 %] 93 % (10/25 0745) Room air  PHYSICAL EXAMINATION: General:  Resting comfortably in bed HENT: NCAT OP clear PULM: few crackles left base, normal effort CV: RRR, no mgr GI: BS+, soft, nontender MSK: normal bulk and tone   Recent Labs Lab 09/18/16 0446 09/20/16 0438 09/21/16 0430  NA 137 136 138  K 3.2* 3.9 3.6  CL 102 104 103  CO2 '25 25 26  '$ BUN '16 19 19  '$ CREATININE 0.99 1.09 0.94  GLUCOSE 119* 119* 114*    Recent Labs Lab 09/19/16 0409 09/20/16 0438 09/21/16 0430  HGB 8.4* 8.0* 7.1*  HCT 27.6* 26.7* 23.7*  WBC 2.6* 2.2* 2.1*  PLT 100* 87* 87*   Ct Chest Wo Contrast  Result Date: 09/21/2016 CLINICAL DATA:  Abnormal chest x-ray EXAM: CT CHEST WITHOUT CONTRAST TECHNIQUE: Multidetector CT imaging of the chest was performed following the standard protocol without IV contrast. COMPARISON:  Radiograph 09/17/2016, CTA chest 09/12/2016 FINDINGS:  Cardiovascular: Limited without intravenous contrast. Right-sided central venous catheter tip terminates at the distal SVC. Scattered calcifications within the thoracic aorta ectasia of the ascending segment measuring 3.7 cm. , descending thoracic aorta measures 3.4 cm. There is cardiomegaly no significant interval change in small pericardial effusion. Mediastinum/Nodes: Mild mediastinal adenopathy. Pretracheal node measures 8 mm. Trachea and mainstem bronchi appear normal. Imaged thyroid gland within normal limits. Mild air distention of the esophagus. Lungs/Pleura: Interim development of small bilateral right greater than left pleural effusions. Calcified granuloma within the left lower lobe. Interim development of multifocal pulmonary nodules and spiculated soft tissue densities within the lower lobes, right middle lobe, and bilateral upper lobes. Upper Abdomen: Stable cyst in the liver. No acute abnormality in the upper abdomen. Musculoskeletal: No acute osseous abnormality. IMPRESSION: 1. Interim development of small bilateral pleural effusions. Interim development of multiple bilateral small pulmonary nodules an multifocal spiculated soft tissue densities throughout all lobes of the lung, many of which demonstrate somewhat peripheral distribution. Differential considerations include septic emboli, multifocal foci of infection or pneumonitis, and unusual entity such as sarcoidosis. Neoplasm or metastatic foci remain in the differential but are felt less likely given the rapidity of development of the findings. 2. Small pericardial effusion, unchanged. Electronically Signed   By: Donavan Foil M.D.   On: 09/21/2016 03:29   Ct Biopsy  Result Date: 09/20/2016 CLINICAL DATA:  Myelodysplastic syndrome and need for bone marrow biopsy. EXAM: CT GUIDED BONE MARROW ASPIRATION AND BIOPSY ANESTHESIA/SEDATION: The patient was not  sedated. He did receive 125 mcg of IV fentanyl during the procedure. PROCEDURE: The  procedure risks, benefits, and alternatives were explained to the patient. Questions regarding the procedure were encouraged and answered. The patient understands and consents to the procedure. A time out was performed prior to initiating the procedure. The right gluteal region was prepped with chlorhexidine. Sterile gown and sterile gloves were used for the procedure. Local anesthesia was provided with 1% Lidocaine. Under CT guidance, an 11 gauge On Control bone cutting needle was advanced from a posterior approach into the right iliac bone. Needle positioning was confirmed with CT. Initial non heparinized and heparinized aspirate samples were obtained of bone marrow. Core biopsy was performed via the On Control drill needle. COMPLICATIONS: None FINDINGS: Inspection of initial aspirate did reveal visible particles. Intact core biopsy sample was obtained. IMPRESSION: CT guided bone marrow biopsy of right posterior iliac bone with both aspirate and core samples obtained. Electronically Signed   By: Aletta Edouard M.D.   On: 09/20/2016 12:28    ASSESSMENT / PLAN: MSSA bacteremia in Neutropenic host Neutropenic fever  MDS Pancytopenia  Dyspnea  H/o volume overload H/o COPD but most recent PFTs with minimal airflow obstruction Multiple pulmonary nodules on CT chest Cough  Discussion  Case reviewed in detail.  CT chest images reviewed, see my description above.  There are now new multiple nodules in a bronchovascular distribution which are most representative of staph septic emboli considering his history, bacteremia and current brown sputum production. Suspect sputum culture will yield MSSA again.  Concomitant viral respiratory infection also possible.  I don't think that a bronchoscopy will yield any additional helpful information but technically it would not be difficult, though his risk of an iatrogenic infection from the procedure is not trivial given his immunosuppression.   Plan Sputum  culture Respiratory viral panel Continue anti-staph therapy as directed by ID Will hold off on bronchoscopy Will follow  Case discussed with son (pulmonologist in [place]) in detail today  Roselie Awkward, MD [place] [facility] Pager: [phone] Cell: [phone] After 3pm or if no response, call [phone]   09/21/2016, 11:15 AM

## 2016-09-21 NOTE — Progress Notes (Signed)
[facility] for Infectious Disease   Reason for visit: Follow up on MSSA bacteremia  Interval History: last fever yesterday, feels better though even when he gets fever, CT chest noted and small nodules that are new.  TEE negative for vegetation.   Physical Exam: Constitutional:  Vitals:   09/21/16 0531 09/21/16 0745  BP: 133/70   Pulse: 83 81  Resp: 19   Temp: 99.6 F (37.6 C)    patient appears in NAD Eyes: anicteric HENT: no thrush Respiratory: Normal respiratory effort; CTA B Cardiovascular: RRR GI: soft, nt, nd  Review of Systems: Constitutional: positive for fevers or negative for anorexia Gastrointestinal: negative for diarrhea Musculoskeletal: negative for myalgias and arthralgias  Lab Results  Component Value Date   WBC 2.1 (L) 09/21/2016   HGB 7.1 (L) 09/21/2016   HCT 23.7 (L) 09/21/2016   MCV 95.2 09/21/2016   PLT 87 (L) 09/21/2016    Lab Results  Component Value Date   CREATININE 0.94 09/21/2016   BUN 19 09/21/2016   NA 138 09/21/2016   K 3.6 09/21/2016   CL 103 09/21/2016   CO2 26 09/21/2016    Lab Results  Component Value Date   ALT 32 09/18/2016   AST 59 (H) 09/18/2016   ALKPHOS 53 09/18/2016     Microbiology: Recent Results (from the past 240 hour(s))  Blood Culture (routine x 2)     Status: Abnormal   Collection Time: 09/14/16 11:42 PM  Result Value Ref Range Status   Specimen Description BLOOD RIGHT ANTECUBITAL  Final   Special Requests BOTTLES DRAWN AEROBIC AND ANAEROBIC 5ML EA  Final   Culture  Setup Time   Final    IN BOTH AEROBIC AND ANAEROBIC BOTTLES GRAM POSITIVE COCCI CRITICAL RESULT CALLED TO, READ BACK BY AND VERIFIED WITH: T. GREEN, PHARMD ([place]) AT 2149 ON 09/15/16 BY C. JESSUP, MLT. Performed at [facility] (A)  Final   Report Status 09/17/2016 FINAL  Final   Organism ID, Bacteria STAPHYLOCOCCUS AUREUS  Final      Susceptibility   Staphylococcus aureus - MIC*   CIPROFLOXACIN >=8 RESISTANT Resistant     ERYTHROMYCIN >=8 RESISTANT Resistant     GENTAMICIN <=0.5 SENSITIVE Sensitive     OXACILLIN 0.5 SENSITIVE Sensitive     TETRACYCLINE <=1 SENSITIVE Sensitive     VANCOMYCIN 1 SENSITIVE Sensitive     TRIMETH/SULFA <=10 SENSITIVE Sensitive     CLINDAMYCIN <=0.25 SENSITIVE Sensitive     RIFAMPIN <=0.5 SENSITIVE Sensitive     Inducible Clindamycin NEGATIVE Sensitive     * STAPHYLOCOCCUS AUREUS  Blood Culture ID Panel (Reflexed)     Status: Abnormal   Collection Time: 09/14/16 11:42 PM  Result Value Ref Range Status   Enterococcus species NOT DETECTED NOT DETECTED Final   Listeria monocytogenes NOT DETECTED NOT DETECTED Final   Staphylococcus species DETECTED (A) NOT DETECTED Final    Comment: CRITICAL RESULT CALLED TO, READ BACK BY AND VERIFIED WITH: T. GREEN, PHARMD ([facility]) AT 2149 ON 09/15/16 BY C. JESSUP, MLT.    Staphylococcus aureus DETECTED (A) NOT DETECTED Final    Comment: CRITICAL RESULT CALLED TO, READ BACK BY AND VERIFIED WITH: T. GREEN, PHARMD ([facility]) AT 2149 ON 09/15/16 BY C. JESSUP, MLT.    Methicillin resistance NOT DETECTED NOT DETECTED Final   Streptococcus species NOT DETECTED NOT DETECTED Final   Streptococcus agalactiae NOT DETECTED NOT DETECTED Final   Streptococcus pneumoniae NOT DETECTED  NOT DETECTED Final   Streptococcus pyogenes NOT DETECTED NOT DETECTED Final   Acinetobacter baumannii NOT DETECTED NOT DETECTED Final   Enterobacteriaceae species NOT DETECTED NOT DETECTED Final   Enterobacter cloacae complex NOT DETECTED NOT DETECTED Final   Escherichia coli NOT DETECTED NOT DETECTED Final   Klebsiella oxytoca NOT DETECTED NOT DETECTED Final   Klebsiella pneumoniae NOT DETECTED NOT DETECTED Final   Proteus species NOT DETECTED NOT DETECTED Final   Serratia marcescens NOT DETECTED NOT DETECTED Final   Haemophilus influenzae NOT DETECTED NOT DETECTED Final   Neisseria meningitidis NOT DETECTED NOT DETECTED Final   Pseudomonas  aeruginosa NOT DETECTED NOT DETECTED Final   Candida albicans NOT DETECTED NOT DETECTED Final   Candida glabrata NOT DETECTED NOT DETECTED Final   Candida krusei NOT DETECTED NOT DETECTED Final   Candida parapsilosis NOT DETECTED NOT DETECTED Final   Candida tropicalis NOT DETECTED NOT DETECTED Final    Comment: Performed at [facility]  Blood Culture (routine x 2)     Status: Abnormal   Collection Time: 09/14/16 11:58 PM  Result Value Ref Range Status   Specimen Description BLOOD BLOOD RIGHT HAND  Final   Special Requests IN PEDIATRIC BOTTLE 2ML  Final   Culture  Setup Time   Final    GRAM POSITIVE COCCI IN CLUSTERS AEROBIC BOTTLE ONLY CRITICAL VALUE NOTED.  VALUE IS CONSISTENT WITH PREVIOUSLY REPORTED AND CALLED VALUE.    Culture (A)  Final    STAPHYLOCOCCUS AUREUS SUSCEPTIBILITIES PERFORMED ON PREVIOUS CULTURE WITHIN THE LAST 5 DAYS. Performed at [facility]    Report Status 09/17/2016 FINAL  Final  Urine culture     Status: None   Collection Time: 09/15/16 12:01 AM  Result Value Ref Range Status   Specimen Description URINE, RANDOM  Final   Special Requests NONE  Final   Culture   Final    NO GROWTH 1 DAY Performed at [facility]    Report Status 09/16/2016 FINAL  Final  Culture, blood (routine x 2)     Status: None (Preliminary result)   Collection Time: 09/17/16  4:50 AM  Result Value Ref Range Status   Specimen Description BLOOD LEFT ARM  Final   Special Requests BOTTLES DRAWN AEROBIC AND ANAEROBIC 5CC EACH  Final   Culture   Final    NO GROWTH 3 DAYS Performed at [facility]    Report Status PENDING  Incomplete  Culture, blood (routine x 2)     Status: None (Preliminary result)   Collection Time: 09/17/16  4:55 AM  Result Value Ref Range Status   Specimen Description BLOOD LEFT HAND  Final   Special Requests BOTTLES DRAWN AEROBIC ONLY 5CC  Final   Culture   Final    NO GROWTH 3 DAYS Performed at [facility]    Report  Status PENDING  Incomplete    Impression/Plan:  1. MSSA bacteremia - on cefazolin.  TEE unrevealing but with CT scan with nodules, this certainly does suggest septic emboli in this setting. A sputum culture may help if positive. I would treat for 4 weeks total and rescan to be sure it is improving/resolved at the end of treatment.   2.  Fever - persistent but trending down.    3.  MDS - bone marrow sent and has follow up with [facility] next week.

## 2016-09-22 ENCOUNTER — Inpatient Hospital Stay (HOSPITAL_COMMUNITY): Payer: Medicare Other

## 2016-09-22 ENCOUNTER — Encounter (HOSPITAL_COMMUNITY): Payer: Self-pay | Admitting: Radiology

## 2016-09-22 DIAGNOSIS — B9689 Other specified bacterial agents as the cause of diseases classified elsewhere: Secondary | ICD-10-CM

## 2016-09-22 LAB — CBC WITH DIFFERENTIAL/PLATELET
BASOS ABS: 0 10*3/uL (ref 0.0–0.1)
BASOS PCT: 1 %
EOS ABS: 0 10*3/uL (ref 0.0–0.7)
Eosinophils Relative: 1 %
HCT: 23.1 % — ABNORMAL LOW (ref 39.0–52.0)
Hemoglobin: 6.9 g/dL — CL (ref 13.0–17.0)
LYMPHS ABS: 0.8 10*3/uL (ref 0.7–4.0)
Lymphocytes Relative: 54 %
MCH: 28.2 pg (ref 26.0–34.0)
MCHC: 29.9 g/dL — AB (ref 30.0–36.0)
MCV: 94.3 fL (ref 78.0–100.0)
MONO ABS: 0.1 10*3/uL (ref 0.1–1.0)
Monocytes Relative: 7 %
NEUTROS ABS: 0.6 10*3/uL — AB (ref 1.7–7.7)
Neutrophils Relative %: 37 %
PLATELETS: 83 10*3/uL — AB (ref 150–400)
RBC: 2.45 MIL/uL — ABNORMAL LOW (ref 4.22–5.81)
RDW: 18.9 % — ABNORMAL HIGH (ref 11.5–15.5)
WBC: 1.5 10*3/uL — ABNORMAL LOW (ref 4.0–10.5)

## 2016-09-22 LAB — RESPIRATORY PANEL BY PCR
ADENOVIRUS-RVPPCR: NOT DETECTED
Bordetella pertussis: NOT DETECTED
CORONAVIRUS NL63-RVPPCR: NOT DETECTED
CORONAVIRUS OC43-RVPPCR: NOT DETECTED
Chlamydophila pneumoniae: NOT DETECTED
Coronavirus 229E: NOT DETECTED
Coronavirus HKU1: NOT DETECTED
INFLUENZA A-RVPPCR: NOT DETECTED
INFLUENZA B-RVPPCR: NOT DETECTED
METAPNEUMOVIRUS-RVPPCR: NOT DETECTED
MYCOPLASMA PNEUMONIAE-RVPPCR: NOT DETECTED
PARAINFLUENZA VIRUS 1-RVPPCR: NOT DETECTED
PARAINFLUENZA VIRUS 2-RVPPCR: NOT DETECTED
PARAINFLUENZA VIRUS 3-RVPPCR: NOT DETECTED
PARAINFLUENZA VIRUS 4-RVPPCR: NOT DETECTED
RESPIRATORY SYNCYTIAL VIRUS-RVPPCR: NOT DETECTED
RHINOVIRUS / ENTEROVIRUS - RVPPCR: NOT DETECTED

## 2016-09-22 LAB — CULTURE, BLOOD (ROUTINE X 2)
CULTURE: NO GROWTH
Culture: NO GROWTH

## 2016-09-22 LAB — PREPARE RBC (CROSSMATCH)

## 2016-09-22 MED ORDER — IOPAMIDOL (ISOVUE-300) INJECTION 61%
30.0000 mL | Freq: Once | INTRAVENOUS | Status: DC | PRN
  Administered 2016-09-22: 30 mL via ORAL
  Filled 2016-09-22: qty 30

## 2016-09-22 MED ORDER — SODIUM CHLORIDE 0.9 % IV SOLN
Freq: Once | INTRAVENOUS | Status: AC
  Administered 2016-09-22: 07:00:00 via INTRAVENOUS

## 2016-09-22 MED ORDER — IOPAMIDOL (ISOVUE-300) INJECTION 61%
100.0000 mL | Freq: Once | INTRAVENOUS | Status: AC | PRN
  Administered 2016-09-22: 100 mL via INTRAVENOUS

## 2016-09-22 NOTE — Progress Notes (Signed)
Pharmacy Antibiotic Note  Warren [name] is a 75 y.o. male admitted on 09/14/2016 with MSSA bacteremia.  Pharmacy has been consulted for cefazolin dosing.  Today, 09/22/2016: Day #8 total antibiotic therapy (Day #6 from 1st day of repeat blood cx with no growth).  Plan is 4 weeks of IV antibiotics  Renal: stable, WNL  WBC: low - trending down (ANC = 600)  Afebrile  Repeat Bcx are NGTD  TEE without evidence if endocarditis  Plan:  Cefazolin 2gm IV q8h     Pharmacy to follow at distance, do not anticipate dose adjustment   Height: 5\' 7"  (170.2 cm) Weight: 163 lb (73.9 kg) IBW/kg (Calculated) : 66.1  Temp (24hrs), Avg:98.6 F (37 C), Min:97.7 F (36.5 C), Max:99.3 F (37.4 C)   Recent Labs Lab 09/17/16 0453 09/18/16 0446 09/19/16 0409 09/20/16 0438 09/21/16 0430 09/22/16 0540  WBC 1.8* 2.4* 2.6* 2.2* 2.1* 1.5*  CREATININE 1.10 0.99  --  1.09 0.94  --     Estimated Creatinine Clearance: 61.5 mL/min (by C-G formula based on SCr of 0.94 mg/dL).    Allergies  Allergen Reactions  . Fluconazole     Rash   . Midazolam Other (See Comments)    Agitated caused by versed   Antimicrobials this admission: Resumed Valtrex & Posaconazole PTA 10/19 cefepime >> 10/20 10/19 vanco >> 10/20 10/19 Oseltamvir >> 10/20 10/20 cefazolin >>  Microbiology results:  10/18 BCx: MSSA 2/2 10/19 UCx: NGF 10/21 BCx (repeat): ngtd  Thank you for allowing pharmacy to be a part of this patient's care.  Doreene Eland, PharmD, BCPS.   Pager: [phone] 09/22/2016 7:12 AM

## 2016-09-22 NOTE — Progress Notes (Signed)
[facility] for Infectious Disease   Reason for visit: Follow up on MSSA bacteremia  Interval History: afebrile 48 hours; no new complaints; CT now abd/pelvis per Dr. Irene Limbo.   Physical Exam: Constitutional:  Vitals:   09/22/16 1020 09/22/16 1319  BP: (!) 144/66 (!) 157/74  Pulse: 70 67  Resp: 16 16  Temp: 98.1 F (36.7 C) 97.9 F (36.6 C)   patient appears in NAD Eyes: anicteric HENT: no thrush Respiratory: Normal respiratory effort; CTA B Cardiovascular: RRR GI: soft, nt, nd  Review of Systems: Constitutional: positive for fevers or negative for anorexia Gastrointestinal: negative for diarrhea Musculoskeletal: negative for myalgias and arthralgias  Lab Results  Component Value Date   WBC 1.5 (L) 09/22/2016   HGB 6.9 (LL) 09/22/2016   HCT 23.1 (L) 09/22/2016   MCV 94.3 09/22/2016   PLT 83 (L) 09/22/2016    Lab Results  Component Value Date   CREATININE 0.94 09/21/2016   BUN 19 09/21/2016   NA 138 09/21/2016   K 3.6 09/21/2016   CL 103 09/21/2016   CO2 26 09/21/2016    Lab Results  Component Value Date   ALT 32 09/18/2016   AST 59 (H) 09/18/2016   ALKPHOS 53 09/18/2016     Microbiology: Recent Results (from the past 240 hour(s))  Blood Culture (routine x 2)     Status: Abnormal   Collection Time: 09/14/16 11:42 PM  Result Value Ref Range Status   Specimen Description BLOOD RIGHT ANTECUBITAL  Final   Special Requests BOTTLES DRAWN AEROBIC AND ANAEROBIC 5ML EA  Final   Culture  Setup Time   Final    IN BOTH AEROBIC AND ANAEROBIC BOTTLES GRAM POSITIVE COCCI CRITICAL RESULT CALLED TO, READ BACK BY AND VERIFIED WITH: T. GREEN, PHARMD ([place]) AT 2149 ON 09/15/16 BY C. JESSUP, MLT. Performed at [facility] (A)  Final   Report Status 09/17/2016 FINAL  Final   Organism ID, Bacteria STAPHYLOCOCCUS AUREUS  Final      Susceptibility   Staphylococcus aureus - MIC*    CIPROFLOXACIN >=8 RESISTANT Resistant    ERYTHROMYCIN >=8 RESISTANT Resistant     GENTAMICIN <=0.5 SENSITIVE Sensitive     OXACILLIN 0.5 SENSITIVE Sensitive     TETRACYCLINE <=1 SENSITIVE Sensitive     VANCOMYCIN 1 SENSITIVE Sensitive     TRIMETH/SULFA <=10 SENSITIVE Sensitive     CLINDAMYCIN <=0.25 SENSITIVE Sensitive     RIFAMPIN <=0.5 SENSITIVE Sensitive     Inducible Clindamycin NEGATIVE Sensitive     * STAPHYLOCOCCUS AUREUS  Blood Culture ID Panel (Reflexed)     Status: Abnormal   Collection Time: 09/14/16 11:42 PM  Result Value Ref Range Status   Enterococcus species NOT DETECTED NOT DETECTED Final   Listeria monocytogenes NOT DETECTED NOT DETECTED Final   Staphylococcus species DETECTED (A) NOT DETECTED Final    Comment: CRITICAL RESULT CALLED TO, READ BACK BY AND VERIFIED WITH: T. GREEN, PHARMD ([place]) AT 2149 ON 09/15/16 BY C. JESSUP, MLT.    Staphylococcus aureus DETECTED (A) NOT DETECTED Final    Comment: CRITICAL RESULT CALLED TO, READ BACK BY AND VERIFIED WITH: T. GREEN, PHARMD ([facility]) AT 2149 ON 09/15/16 BY C. JESSUP, MLT.    Methicillin resistance NOT DETECTED NOT DETECTED Final   Streptococcus species NOT DETECTED NOT DETECTED Final   Streptococcus agalactiae NOT DETECTED NOT DETECTED Final   Streptococcus pneumoniae NOT DETECTED NOT DETECTED Final   Streptococcus pyogenes NOT  DETECTED NOT DETECTED Final   Acinetobacter baumannii NOT DETECTED NOT DETECTED Final   Enterobacteriaceae species NOT DETECTED NOT DETECTED Final   Enterobacter cloacae complex NOT DETECTED NOT DETECTED Final   Escherichia coli NOT DETECTED NOT DETECTED Final   Klebsiella oxytoca NOT DETECTED NOT DETECTED Final   Klebsiella pneumoniae NOT DETECTED NOT DETECTED Final   Proteus species NOT DETECTED NOT DETECTED Final   Serratia marcescens NOT DETECTED NOT DETECTED Final   Haemophilus influenzae NOT DETECTED NOT DETECTED Final   Neisseria meningitidis NOT DETECTED NOT DETECTED Final   Pseudomonas aeruginosa NOT DETECTED NOT DETECTED Final    Candida albicans NOT DETECTED NOT DETECTED Final   Candida glabrata NOT DETECTED NOT DETECTED Final   Candida krusei NOT DETECTED NOT DETECTED Final   Candida parapsilosis NOT DETECTED NOT DETECTED Final   Candida tropicalis NOT DETECTED NOT DETECTED Final    Comment: Performed at [facility]  Blood Culture (routine x 2)     Status: Abnormal   Collection Time: 09/14/16 11:58 PM  Result Value Ref Range Status   Specimen Description BLOOD BLOOD RIGHT HAND  Final   Special Requests IN PEDIATRIC BOTTLE 2ML  Final   Culture  Setup Time   Final    GRAM POSITIVE COCCI IN CLUSTERS AEROBIC BOTTLE ONLY CRITICAL VALUE NOTED.  VALUE IS CONSISTENT WITH PREVIOUSLY REPORTED AND CALLED VALUE.    Culture (A)  Final    STAPHYLOCOCCUS AUREUS SUSCEPTIBILITIES PERFORMED ON PREVIOUS CULTURE WITHIN THE LAST 5 DAYS. Performed at [facility]    Report Status 09/17/2016 FINAL  Final  Urine culture     Status: None   Collection Time: 09/15/16 12:01 AM  Result Value Ref Range Status   Specimen Description URINE, RANDOM  Final   Special Requests NONE  Final   Culture   Final    NO GROWTH 1 DAY Performed at [facility]    Report Status 09/16/2016 FINAL  Final  Culture, blood (routine x 2)     Status: None   Collection Time: 09/17/16  4:50 AM  Result Value Ref Range Status   Specimen Description BLOOD LEFT ARM  Final   Special Requests BOTTLES DRAWN AEROBIC AND ANAEROBIC 5CC EACH  Final   Culture   Final    NO GROWTH 5 DAYS Performed at [facility]    Report Status 09/22/2016 FINAL  Final  Culture, blood (routine x 2)     Status: None   Collection Time: 09/17/16  4:55 AM  Result Value Ref Range Status   Specimen Description BLOOD LEFT HAND  Final   Special Requests BOTTLES DRAWN AEROBIC ONLY 5CC  Final   Culture   Final    NO GROWTH 5 DAYS Performed at [facility]    Report Status 09/22/2016 FINAL  Final  Respiratory Panel by PCR     Status: None    Collection Time: 09/21/16  3:41 PM  Result Value Ref Range Status   Adenovirus NOT DETECTED NOT DETECTED Final   Coronavirus 229E NOT DETECTED NOT DETECTED Final   Coronavirus HKU1 NOT DETECTED NOT DETECTED Final   Coronavirus NL63 NOT DETECTED NOT DETECTED Final   Coronavirus OC43 NOT DETECTED NOT DETECTED Final   Metapneumovirus NOT DETECTED NOT DETECTED Final   Rhinovirus / Enterovirus NOT DETECTED NOT DETECTED Final   Influenza A NOT DETECTED NOT DETECTED Final   Influenza B NOT DETECTED NOT DETECTED Final   Parainfluenza Virus 1 NOT DETECTED NOT DETECTED  Final   Parainfluenza Virus 2 NOT DETECTED NOT DETECTED Final   Parainfluenza Virus 3 NOT DETECTED NOT DETECTED Final   Parainfluenza Virus 4 NOT DETECTED NOT DETECTED Final   Respiratory Syncytial Virus NOT DETECTED NOT DETECTED Final   Bordetella pertussis NOT DETECTED NOT DETECTED Final   Chlamydophila pneumoniae NOT DETECTED NOT DETECTED Final   Mycoplasma pneumoniae NOT DETECTED NOT DETECTED Final    Comment: Performed at [facility]    Impression/Plan:  1. MSSA bacteremia - on cefazolin.  Negative TEE but possible septic emboli on lungs and so will treat for 4 weeks total with cefazolin through November 17th. Antibiotics per home health protocol We will arrange follow up in [place] to pull picc at the end of treatment  2.  Fever - likely from #1 and resolving; respiratory panel negative  3.  Isolation - panel negative, will remove droplet isolation.

## 2016-09-22 NOTE — Progress Notes (Signed)
[facility] PROGRESS NOTE  Warren [name] VVO:[id number] DOB: [birth year] DOA: 09/14/2016  PCP: Wenda Low, MD  Brief History/Interval Summary: Warren [name] is a 75 y.o. male with MDS who was admitted recently for shortness of breath and respiratory failure with fluid overload suspected to be secondary to chemotherapy. Patient was discharged home on 10/18 and then subsequently developed fever and chills at home and so he came back to the hospital. Patient found to have neutropenia. He was hospitalized for further management. Blood Cultures returned positive for staph aureus. Patient to undergo bone marrow biopsy and TEE 10/24.  Reason for Visit: Neutropenic fever  Consultants: Oncology. Infectious disease. Interventional radiology. Pulmonology  Procedures: Bone marrow biopsy and TEE is scheduled for 10/24  Antibiotics: Vancomycin, cefepime , stopped on 10/20 Tamiflu initiated 10/19. Stopped on 10/20. Cefazolin 10/20  Subjective/Interval History: Patient was inquiring about flu shot and results of abdominal scan. Patient's questions have been answered to their satisfaction.  ROS: Denies any nausea, vomiting, abdominal pain, diarrhea.   Objective:  Vital Signs  Vitals:   09/22/16 0609 09/22/16 0940 09/22/16 1020 09/22/16 1319  BP: (!) 150/68 136/67 (!) 144/66 (!) 157/74  Pulse: 78 71 70 67  Resp: '16 16 16 16  '$ Temp: 98.7 F (37.1 C) 98.5 F (36.9 C) 98.1 F (36.7 C) 97.9 F (36.6 C)  TempSrc: Oral Oral Oral Oral  SpO2: 96% 98% 98% 100%  Weight:      Height:        Intake/Output Summary (Last 24 hours) at 09/22/16 1718 Last data filed at 09/22/16 1500  Gross per 24 hour  Intake             2618 ml  Output                0 ml  Net             2618 ml   Filed Weights   09/14/16 2254 09/15/16 0230  Weight: 76.2 kg (168 lb) 73.9 kg (163 lb)    General appearance: Awake and alert, in no acute distress Resp: No wheezing heard today. Good air entry  bilaterally. No rhonchi. Normal effort.   Cardio: regular rate and rhythm, S1, S2 normal, no murmur, click, rub or gallop GI: soft, non-tender; bowel sounds normal; no masses,  no organomegaly Extremities: Minimal edema lower extremities. Neurologic: Awake and alert. Oriented 3. Cranial nerves II-12 intact. Motor strength is equal bilateral upper and lower extremities.  Lab Results:  Data Reviewed: I have personally reviewed following labs and imaging studies  CBC:  Recent Labs Lab 09/18/16 0446 09/19/16 0409 09/20/16 0438 09/21/16 0430 09/22/16 0540  WBC 2.4* 2.6* 2.2* 2.1* 1.5*  NEUTROABS 1.3* 1.4* 1.1* 0.9* 0.6*  HGB 8.6* 8.4* 8.0* 7.1* 6.9*  HCT 28.7* 27.6* 26.7* 23.7* 23.1*  MCV 93.5 94.8 94.0 95.2 94.3  PLT 102* 100* 87* 87* 83*    Basic Metabolic Panel:  Recent Labs Lab 09/17/16 0453 09/18/16 0446 09/20/16 0438 09/21/16 0430  NA 137 137 136 138  K 3.7 3.2* 3.9 3.6  CL 104 102 104 103  CO2 '27 25 25 26  '$ GLUCOSE 106* 119* 119* 114*  BUN 21* '16 19 19  '$ CREATININE 1.10 0.99 1.09 0.94  CALCIUM 8.0* 7.9* 7.9* 7.8*    GFR: Estimated Creatinine Clearance: 61.5 mL/min (by C-G formula based on SCr of 0.94 mg/dL).  Liver Function Tests:  Recent Labs Lab 09/18/16 0446  AST 59*  ALT 32  ALKPHOS  53  BILITOT 1.6*  PROT 6.0*  ALBUMIN 3.1*    Cardiac Enzymes: No results for input(s): CKTOTAL, CKMB, CKMBINDEX, TROPONINI in the last 168 hours.    Results for orders placed or performed during the hospital encounter of 09/14/16  Blood Culture (routine x 2)     Status: Abnormal   Collection Time: 09/14/16 11:42 PM  Result Value Ref Range Status   Specimen Description BLOOD RIGHT ANTECUBITAL  Final   Special Requests BOTTLES DRAWN AEROBIC AND ANAEROBIC 5ML EA  Final   Culture  Setup Time   Final    IN BOTH AEROBIC AND ANAEROBIC BOTTLES GRAM POSITIVE COCCI CRITICAL RESULT CALLED TO, READ BACK BY AND VERIFIED WITH: T. GREEN, PHARMD ([facility]) AT 2149 ON 09/15/16 BY  C. JESSUP, MLT. Performed at [facility] (A)  Final   Report Status 09/17/2016 FINAL  Final   Organism ID, Bacteria STAPHYLOCOCCUS AUREUS  Final      Susceptibility   Staphylococcus aureus - MIC*    CIPROFLOXACIN >=8 RESISTANT Resistant     ERYTHROMYCIN >=8 RESISTANT Resistant     GENTAMICIN <=0.5 SENSITIVE Sensitive     OXACILLIN 0.5 SENSITIVE Sensitive     TETRACYCLINE <=1 SENSITIVE Sensitive     VANCOMYCIN 1 SENSITIVE Sensitive     TRIMETH/SULFA <=10 SENSITIVE Sensitive     CLINDAMYCIN <=0.25 SENSITIVE Sensitive     RIFAMPIN <=0.5 SENSITIVE Sensitive     Inducible Clindamycin NEGATIVE Sensitive     * STAPHYLOCOCCUS AUREUS  Blood Culture ID Panel (Reflexed)     Status: Abnormal   Collection Time: 09/14/16 11:42 PM  Result Value Ref Range Status   Enterococcus species NOT DETECTED NOT DETECTED Final   Listeria monocytogenes NOT DETECTED NOT DETECTED Final   Staphylococcus species DETECTED (A) NOT DETECTED Final    Comment: CRITICAL RESULT CALLED TO, READ BACK BY AND VERIFIED WITH: T. GREEN, PHARMD ([facility]) AT 2149 ON 09/15/16 BY C. JESSUP, MLT.    Staphylococcus aureus DETECTED (A) NOT DETECTED Final    Comment: CRITICAL RESULT CALLED TO, READ BACK BY AND VERIFIED WITH: T. GREEN, PHARMD ([facility]) AT 2149 ON 09/15/16 BY C. JESSUP, MLT.    Methicillin resistance NOT DETECTED NOT DETECTED Final   Streptococcus species NOT DETECTED NOT DETECTED Final   Streptococcus agalactiae NOT DETECTED NOT DETECTED Final   Streptococcus pneumoniae NOT DETECTED NOT DETECTED Final   Streptococcus pyogenes NOT DETECTED NOT DETECTED Final   Acinetobacter baumannii NOT DETECTED NOT DETECTED Final   Enterobacteriaceae species NOT DETECTED NOT DETECTED Final   Enterobacter cloacae complex NOT DETECTED NOT DETECTED Final   Escherichia coli NOT DETECTED NOT DETECTED Final   Klebsiella oxytoca NOT DETECTED NOT DETECTED Final   Klebsiella pneumoniae NOT DETECTED NOT  DETECTED Final   Proteus species NOT DETECTED NOT DETECTED Final   Serratia marcescens NOT DETECTED NOT DETECTED Final   Haemophilus influenzae NOT DETECTED NOT DETECTED Final   Neisseria meningitidis NOT DETECTED NOT DETECTED Final   Pseudomonas aeruginosa NOT DETECTED NOT DETECTED Final   Candida albicans NOT DETECTED NOT DETECTED Final   Candida glabrata NOT DETECTED NOT DETECTED Final   Candida krusei NOT DETECTED NOT DETECTED Final   Candida parapsilosis NOT DETECTED NOT DETECTED Final   Candida tropicalis NOT DETECTED NOT DETECTED Final    Comment: Performed at [facility]  Blood Culture (routine x 2)     Status: Abnormal   Collection Time: 09/14/16 11:58 PM  Result Value  Ref Range Status   Specimen Description BLOOD BLOOD RIGHT HAND  Final   Special Requests IN PEDIATRIC BOTTLE 2ML  Final   Culture  Setup Time   Final    GRAM POSITIVE COCCI IN CLUSTERS AEROBIC BOTTLE ONLY CRITICAL VALUE NOTED.  VALUE IS CONSISTENT WITH PREVIOUSLY REPORTED AND CALLED VALUE.    Culture (A)  Final    STAPHYLOCOCCUS AUREUS SUSCEPTIBILITIES PERFORMED ON PREVIOUS CULTURE WITHIN THE LAST 5 DAYS. Performed at [facility]    Report Status 09/17/2016 FINAL  Final  Urine culture     Status: None   Collection Time: 09/15/16 12:01 AM  Result Value Ref Range Status   Specimen Description URINE, RANDOM  Final   Special Requests NONE  Final   Culture   Final    NO GROWTH 1 DAY Performed at [facility]    Report Status 09/16/2016 FINAL  Final  Culture, blood (routine x 2)     Status: None   Collection Time: 09/17/16  4:50 AM  Result Value Ref Range Status   Specimen Description BLOOD LEFT ARM  Final   Special Requests BOTTLES DRAWN AEROBIC AND ANAEROBIC 5CC EACH  Final   Culture   Final    NO GROWTH 5 DAYS Performed at [facility]    Report Status 09/22/2016 FINAL  Final  Culture, blood (routine x 2)     Status: None   Collection Time: 09/17/16  4:55 AM  Result  Value Ref Range Status   Specimen Description BLOOD LEFT HAND  Final   Special Requests BOTTLES DRAWN AEROBIC ONLY 5CC  Final   Culture   Final    NO GROWTH 5 DAYS Performed at [facility]    Report Status 09/22/2016 FINAL  Final  Respiratory Panel by PCR     Status: None   Collection Time: 09/21/16  3:41 PM  Result Value Ref Range Status   Adenovirus NOT DETECTED NOT DETECTED Final   Coronavirus 229E NOT DETECTED NOT DETECTED Final   Coronavirus HKU1 NOT DETECTED NOT DETECTED Final   Coronavirus NL63 NOT DETECTED NOT DETECTED Final   Coronavirus OC43 NOT DETECTED NOT DETECTED Final   Metapneumovirus NOT DETECTED NOT DETECTED Final   Rhinovirus / Enterovirus NOT DETECTED NOT DETECTED Final   Influenza A NOT DETECTED NOT DETECTED Final   Influenza B NOT DETECTED NOT DETECTED Final   Parainfluenza Virus 1 NOT DETECTED NOT DETECTED Final   Parainfluenza Virus 2 NOT DETECTED NOT DETECTED Final   Parainfluenza Virus 3 NOT DETECTED NOT DETECTED Final   Parainfluenza Virus 4 NOT DETECTED NOT DETECTED Final   Respiratory Syncytial Virus NOT DETECTED NOT DETECTED Final   Bordetella pertussis NOT DETECTED NOT DETECTED Final   Chlamydophila pneumoniae NOT DETECTED NOT DETECTED Final   Mycoplasma pneumoniae NOT DETECTED NOT DETECTED Final    Comment: Performed at [facility]     Radiology Studies: Ct Chest Wo Contrast  Result Date: 09/21/2016 CLINICAL DATA:  Abnormal chest x-ray EXAM: CT CHEST WITHOUT CONTRAST TECHNIQUE: Multidetector CT imaging of the chest was performed following the standard protocol without IV contrast. COMPARISON:  Radiograph 09/17/2016, CTA chest 09/12/2016 FINDINGS: Cardiovascular: Limited without intravenous contrast. Right-sided central venous catheter tip terminates at the distal SVC. Scattered calcifications within the thoracic aorta ectasia of the ascending segment measuring 3.7 cm. , descending thoracic aorta measures 3.4 cm. There is cardiomegaly  no significant interval change in small pericardial effusion. Mediastinum/Nodes: Mild mediastinal adenopathy. Pretracheal node measures  8 mm. Trachea and mainstem bronchi appear normal. Imaged thyroid gland within normal limits. Mild air distention of the esophagus. Lungs/Pleura: Interim development of small bilateral right greater than left pleural effusions. Calcified granuloma within the left lower lobe. Interim development of multifocal pulmonary nodules and spiculated soft tissue densities within the lower lobes, right middle lobe, and bilateral upper lobes. Upper Abdomen: Stable cyst in the liver. No acute abnormality in the upper abdomen. Musculoskeletal: No acute osseous abnormality. IMPRESSION: 1. Interim development of small bilateral pleural effusions. Interim development of multiple bilateral small pulmonary nodules an multifocal spiculated soft tissue densities throughout all lobes of the lung, many of which demonstrate somewhat peripheral distribution. Differential considerations include septic emboli, multifocal foci of infection or pneumonitis, and unusual entity such as sarcoidosis. Neoplasm or metastatic foci remain in the differential but are felt less likely given the rapidity of development of the findings. 2. Small pericardial effusion, unchanged. Electronically Signed   By: Donavan Foil M.D.   On: 09/21/2016 03:29   Ct Abdomen Pelvis W Contrast  Result Date: 09/22/2016 CLINICAL DATA:  Leukocytosis, sepsis. History of prostate cancer. Evaluate for pelvic abscess. EXAM: CT ABDOMEN AND PELVIS WITH CONTRAST TECHNIQUE: Multidetector CT imaging of the abdomen and pelvis was performed using the standard protocol following bolus administration of intravenous contrast. CONTRAST:  100 mL Isovue 300 IV COMPARISON:  Correlation with CT chest dated 09/20/2016 FINDINGS: Lower chest: Bilateral pulmonary nodules, including a dominant 1.9 cm right lower lobe nodule (series 6/ image 3), unchanged from  recent CT. Small bilateral pleural effusions, right greater than left, mildly increased. Hepatobiliary: Scattered hepatic cysts, measuring up to 2.0 cm in the central right liver (series 2/ image 17). Gallbladder is unremarkable. No intrahepatic or extrahepatic ductal dilatation. Pancreas: Within normal limits. Spleen: Within normal limits. Adrenals/Urinary Tract: Adrenal glands are within normal limits. Right renal cysts, including a dominant 3.0 cm right renal sinus cyst (series 2/ image 31). Left kidney is within normal limits. No hydronephrosis. Bladder is within normal limits. Stomach/Bowel: Stomach is within normal limits. No evidence of bowel obstruction. Normal appendix (series 2/ image 51). Vascular/Lymphatic: Atherosclerotic calcifications of the abdominal aorta and branch vessels. No evidence of abdominal aortic aneurysm. No suspicious abdominopelvic lymphadenopathy. Status post pelvic lymphadenectomy. Reproductive: Status post prostatectomy. Other: No abdominopelvic ascites. No drainable fluid collection/abscess. Musculoskeletal: Mild degenerative changes of the visualized thoracolumbar spine. IMPRESSION: No acute findings in the abdomen/pelvis. Specifically, no evidence of drainable fluid collection/abscess. Status post prostatectomy with pelvic lymphadenectomy. No evidence of recurrent or metastatic disease. Multiple bilateral pulmonary nodules, measuring up to 1.9 cm in the right lower lobe, unchanged from recent CT although new from 09/12/2016. Infection such as septic emboli is considered the diagnosis of conclusion. Neoplasm, metastatic disease, and sarcoidosis are not considered likely. Small bilateral pleural effusions, mildly increased. Electronically Signed   By: Julian Hy M.D.   On: 09/22/2016 15:43     Medications:  Scheduled: . sodium chloride  250 mL Intravenous Once  . acetaminophen  650 mg Oral Once  . calcium carbonate  1 tablet Oral QHS  .  ceFAZolin (ANCEF) IV  2 g  Intravenous Q8H  . chlorhexidine  5 mL Mouth/Throat BID  . diphenhydrAMINE  25 mg Oral Once  . irbesartan  37.5 mg Oral Daily  . multivitamin with minerals  1 tablet Oral Daily  . pantoprazole  40 mg Oral Daily  . polyethylene glycol  17 g Oral Daily  . posaconazole  300 mg Oral QHS  .  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 Infectious disease is following. Cultures have been repeated 10/21 which have not shown any growth so far. Patient is currently on Ancef. Vancomycin and cefepime were discontinued. Tamiflu was also discontinued. Influenza PCR was negative. Source of this infection is not entirely clear, although patient did have cellulitis involving his left ankle area recently. MRI of the ankle did not show any obvious abscess. No evidence for osteomyelitis. Nonspecific tendon changes were noted. Patient recalls ankle injury remotely. No recent injury. patient is status post TEE. Continue patient's home dose of posaconazole and valacyclovir. PICC line inserted.  Antibiotic plan outlined by infectious disease specialist, CT scan of abdomen and pelvis negative  Myelodysplastic syndrome with pancytopenia Being followed by Dr. Irene Limbo and also at [facility] for possible stem cell transplant. Patient  underwent bone marrow biopsy earlier today. Continue to monitor CBC. Neutrophil counts appear to have improved. He did receive 1 unit of blood ordered by his hematologist. Platelet counts noted to be low but stable. No evidence for bleeding. Continue to hold Lovenox.  Recently admitted for fluid overload Most likely from chemotherapy related. Echocardiogram showed preserved LV function with no diastolic dysfunction. He appears to be quite euvolemic at this time. BNP is normal. Continue to hold Lasix.  Dyspnea in the setting of known COPD Pulmonary on board  Hypokalemia Repleted. And wnl on last check  DVT Prophylaxis: Changed Lovenox to SCDs due to low platelets. Code Status: Full code  Family Communication: Discussed with the patient and his wife. Disposition Plan: pending final recommendations from specialist involved.    LOS: 7 days   Velvet Bathe  [facility] Pager [phone] 09/22/2016, 5:18 PM  If 7PM-7AM, please contact night-coverage at [email or web address], password [facility]

## 2016-09-22 NOTE — Progress Notes (Signed)
PT Cancellation Note  Patient Details Name: Warren [name] MRN: [id number] DOB: 12/15/1940   Cancelled Treatment:     will hold off PT for today due to need for blood transfusion and ABD CT.   Rica Koyanagi  PTA [facility]  Acute  Rehab Pager      [phone]

## 2016-09-22 NOTE — Progress Notes (Signed)
[name]   HEMATOLOGY/ONCOLOGY INPATIENT PROGRESS NOTE  Date of Service: 09/19/2016  Inpatient Attending: .Velvet Bathe, MD   SUBJECTIVE  Warren [name] notes he is doing okay and is glad that the fevers are gone. CT chest with multiple nodular lesions concerning for septic embolic. CT abd done today with no evidence of metastatic abscesses. Tolerating Cefazolin. Getting PRBC transfusions for symptomatic anemia. Has a f/u with Dr Royce Macadamia at [facility] on Monday and hopes to make it to that appointment.  OBJECTIVE:  NAD  PHYSICAL EXAMINATION: . Vitals:   09/22/16 0609 09/22/16 0940 09/22/16 1020 09/22/16 1319  BP: (!) 150/68 136/67 (!) 144/66 (!) 157/74  Pulse: 78 71 70 67  Resp: '16 16 16 16  '$ Temp: 98.7 F (37.1 C) 98.5 F (36.9 C) 98.1 F (36.7 C) 97.9 F (36.6 C)  TempSrc: Oral Oral Oral Oral  SpO2: 96% 98% 98% 100%  Weight:      Height:       Filed Weights   09/14/16 2254 09/15/16 0230  Weight: 168 lb (76.2 kg) 163 lb (73.9 kg)   .Body mass index is 25.53 kg/m.  GENERAL:alert, appears brighter today SKIN: no acute new rashes EYES: normal, conjunctiva are pink and non-injected, sclera clear OROPHARYNX:no exudate, no erythema and lips, buccal mucosa, and tongue normal  NECK: supple, no JVD, thyroid normal size, non-tender, without nodularity LYMPH:  no palpable lymphadenopathy in the cervical, axillary or inguinal LUNGS: clear to auscultation with normal respiratory effort HEART: regular rate & rhythm,  no murmurs and no lower extremity edema ABDOMEN: abdomen soft, non-tender, normoactive bowel sounds  Musculoskeletal: no cyanosis of digits and no clubbing  PSYCH: alert & oriented x 3 with fluent speech NEURO: no focal motor/sensory deficits   MEDICAL HISTORY:  Past Medical History:  Diagnosis Date  . Cancer [facility])    Prostate, Melanoma- lt. Shoulder (no further problems since 2010  . CHF (congestive heart failure) ([place]) 09/13/2016  . Complication of anesthesia    versed gave the adverse reaction pt. ,became aggitated  . COPD (chronic obstructive pulmonary disease) ([facility])    Dr. Annamaria Boots follows  . Diffuse esophageal spasm   . Diverticulosis   . DJD (degenerative joint disease)   . Multiple trauma     horse accident 2003 L SHOULDER  . Oral herpes   . OSA (obstructive sleep apnea)    mild, rare cpap use  . Peripheral neuropathy ([place])   . Pulmonary nodule   . Spinal stenosis   . Systolic hypertension     SURGICAL HISTORY: Past Surgical History:  Procedure Laterality Date  . ,laceration to head in accident with hourse    . ACOUSTIC NEUROMA LEFT EAR  1999  . BALLOON DILATION N/A 11/25/2014   Procedure: BALLOON DILATION;  Surgeon: Garlan Fair, MD;  Location: [facility];  Service: Endoscopy;  Laterality: N/A;  . COLONOSCOPY WITH PROPOFOL N/A 10/01/2013   Procedure: COLONOSCOPY WITH PROPOFOL;  Surgeon: Garlan Fair, MD;  Location: [facility] ENDOSCOPY;  Service: Endoscopy;  Laterality: N/A;  . ESOPHAGOGASTRODUODENOSCOPY (EGD) WITH PROPOFOL N/A 11/25/2014   Procedure: ESOPHAGOGASTRODUODENOSCOPY (EGD) WITH PROPOFOL;  Surgeon: Garlan Fair, MD;  Location: [facility] ENDOSCOPY;  Service: Endoscopy;  Laterality: N/A;  . EYE SURGERY     cosmetic surgery"brow lift" 4'15  . MULTIPLE PROCEDURES FOR LEFT ARM     ELBOW,RIB FX PNEUMOTHORAX AFTER FALLING OFF MOUNTAIN  . PILONIDAL CYST / SINUS EXCISION    . RADICAL PROSTATECTOMY    . TEE WITHOUT  CARDIOVERSION N/A 09/20/2016   Procedure: TRANSESOPHAGEAL ECHOCARDIOGRAM (TEE);  Surgeon: Jerline Pain, MD;  Location: [facility] ENDOSCOPY;  Service: Cardiovascular;  Laterality: N/A;    SOCIAL HISTORY: Social History   Social History  . Marital status: Married    Spouse name: N/A  . Number of children: N/A  . Years of education: N/A   Occupational History  . Not on file.   Social History Main Topics  . Smoking status: Former Smoker    Packs/day: 1.00    Years: 40.00    Quit date: 11/29/1995  . Smokeless tobacco:  Never Used  . Alcohol use Yes     Comment: 1 drink per day  . Drug use: No  . Sexual activity: Not on file   Other Topics Concern  . Not on file   Social History Narrative   CPAP 8 advanced   Patient drinks 5 cups of caffeine daily   Exercises 3-4 times weekly   Son is a pulmonologist    FAMILY HISTORY: Family History  Problem Relation Age of Onset  . Emphysema Father   . Asthma Father   . Lung cancer Father   . Breast cancer Sister   . Breast cancer Sister   . Skin cancer Brother   . Prostate cancer Brother     ALLERGIES:  is allergic to fluconazole and midazolam.  MEDICATIONS:  Scheduled Meds: . sodium chloride  250 mL Intravenous Once  . acetaminophen  650 mg Oral Once  . calcium carbonate  1 tablet Oral QHS  .  ceFAZolin (ANCEF) IV  2 g Intravenous Q8H  . chlorhexidine  5 mL Mouth/Throat BID  . diphenhydrAMINE  25 mg Oral Once  . irbesartan  37.5 mg Oral Daily  . multivitamin with minerals  1 tablet Oral Daily  . pantoprazole  40 mg Oral Daily  . polyethylene glycol  17 g Oral Daily  . posaconazole  300 mg Oral QHS  . valACYclovir  500 mg Oral BID   Continuous Infusions:  PRN Meds:.acetaminophen **OR** acetaminophen, albuterol, heparin lock flush, heparin lock flush, iopamidol, magic mouthwash, magnesium citrate, nitroGLYCERIN, ondansetron **OR** ondansetron (ZOFRAN) IV, senna-docusate, sodium chloride flush, sodium chloride flush  REVIEW OF SYSTEMS:    10 Point review of Systems was done is negative except as noted above.   LABORATORY DATA:  I have reviewed the data as listed  .[name] CBC Latest Ref Rng & Units 09/22/2016 09/21/2016 09/20/2016  WBC 4.0 - 10.5 K/uL 1.5(L) 2.1(L) 2.2(L)  Hemoglobin 13.0 - 17.0 g/dL 6.9(LL) 7.1(L) 8.0(L)  Hematocrit 39.0 - 52.0 % 23.1(L) 23.7(L) 26.7(L)  Platelets 150 - 400 K/uL 83(L) 87(L) 87(L)   . CBC    Component Value Date/Time   WBC 1.5 (L) 09/22/2016 0540   RBC 2.45 (L) 09/22/2016 0540   HGB 6.9 (LL) 09/22/2016  0540   HGB 7.9 (L) 09/12/2016 1046   HCT 23.1 (L) 09/22/2016 0540   HCT 27.3 (L) 09/12/2016 1046   PLT 83 (L) 09/22/2016 0540   PLT 124 (L) 09/12/2016 1046   MCV 94.3 09/22/2016 0540   MCV 97.8 09/12/2016 1046   MCH 28.2 09/22/2016 0540   MCHC 29.9 (L) 09/22/2016 0540   RDW 18.9 (H) 09/22/2016 0540   RDW 20.7 (H) 09/12/2016 1046   LYMPHSABS 0.8 09/22/2016 0540   LYMPHSABS 1.0 09/12/2016 1046   MONOABS 0.1 09/22/2016 0540   MONOABS 0.1 09/12/2016 1046   EOSABS 0.0 09/22/2016 0540   EOSABS 0.1 09/12/2016 1046   BASOSABS  0.0 09/22/2016 0540   BASOSABS 0.0 09/12/2016 1046    . CMP Latest Ref Rng & Units 09/21/2016 09/20/2016 09/18/2016  Glucose 65 - 99 mg/dL 114(H) 119(H) 119(H)  BUN 6 - 20 mg/dL '19 19 16  '$ Creatinine 0.61 - 1.24 mg/dL 0.94 1.09 0.99  Sodium 135 - 145 mmol/L 138 136 137  Potassium 3.5 - 5.1 mmol/L 3.6 3.9 3.2(L)  Chloride 101 - 111 mmol/L 103 104 102  CO2 22 - 32 mmol/L '26 25 25  '$ Calcium 8.9 - 10.3 mg/dL 7.8(L) 7.9(L) 7.9(L)  Total Protein 6.5 - 8.1 g/dL - - 6.0(L)  Total Bilirubin 0.3 - 1.2 mg/dL - - 1.6(H)  Alkaline Phos 38 - 126 U/L - - 53  AST 15 - 41 U/L - - 59(H)  ALT 17 - 63 U/L - - 32     RADIOGRAPHIC STUDIES: I have personally reviewed the radiological images as listed and agreed with the findings in the report. Dg Chest 2 View  Result Date: 09/17/2016 CLINICAL DATA:  Cough and shortness of Breath EXAM: CHEST  2 VIEW COMPARISON:  09/14/2016 FINDINGS: Cardiac shadow is within normal limits. Aortic calcifications are again seen. Mild increased vascular congestion with interstitial edema is noted. Some patchy changes are noted in the right lung base which were not well seen on the prior exam consistent with atelectasis. IMPRESSION: Mild increase in vascular congestion with interstitial edema. Mild right basilar atelectasis. Electronically Signed   By: Inez Catalina M.D.   On: 09/17/2016 09:04   Dg Chest 2 View  Result Date: 09/12/2016 CLINICAL DATA:   Neutropenic patient with dyspnea and ankle swelling. Patient on chemotherapy. History of prostate cancer. EXAM: CHEST  2 VIEW COMPARISON:  07/17/2016 chest radiograph obtained 02/28/2013 chest CT. FINDINGS: The heart size and mediastinal contours are within normal limits. Both lungs are clear. Surgical screw is seen over the left humeral head. Stable T6 through T8 mild compression deformities since 2014 comparison accounting for mild mid thoracic kyphosis. No apparent blastic disease. IMPRESSION: No active cardiopulmonary disease. Electronically Signed   By: Ashley Royalty M.D.   On: 09/12/2016 18:02   Dg Ankle Complete Left  Result Date: 09/16/2016 CLINICAL DATA:  Cellulitis. EXAM: LEFT ANKLE COMPLETE - 3+ VIEW COMPARISON:  No recent prior. FINDINGS: No acute bony or joint abnormality. No evidence of fracture dislocation. Peripheral vascular calcification. IMPRESSION: No acute bony abnormality.  Peripheral vascular disease. Electronically Signed   By: Marcello Moores  Register   On: 09/16/2016 12:32   Ct Chest Wo Contrast  Result Date: 09/21/2016 CLINICAL DATA:  Abnormal chest x-ray EXAM: CT CHEST WITHOUT CONTRAST TECHNIQUE: Multidetector CT imaging of the chest was performed following the standard protocol without IV contrast. COMPARISON:  Radiograph 09/17/2016, CTA chest 09/12/2016 FINDINGS: Cardiovascular: Limited without intravenous contrast. Right-sided central venous catheter tip terminates at the distal SVC. Scattered calcifications within the thoracic aorta ectasia of the ascending segment measuring 3.7 cm. , descending thoracic aorta measures 3.4 cm. There is cardiomegaly no significant interval change in small pericardial effusion. Mediastinum/Nodes: Mild mediastinal adenopathy. Pretracheal node measures 8 mm. Trachea and mainstem bronchi appear normal. Imaged thyroid gland within normal limits. Mild air distention of the esophagus. Lungs/Pleura: Interim development of small bilateral right greater than  left pleural effusions. Calcified granuloma within the left lower lobe. Interim development of multifocal pulmonary nodules and spiculated soft tissue densities within the lower lobes, right middle lobe, and bilateral upper lobes. Upper Abdomen: Stable cyst in the liver. No acute abnormality in the  upper abdomen. Musculoskeletal: No acute osseous abnormality. IMPRESSION: 1. Interim development of small bilateral pleural effusions. Interim development of multiple bilateral small pulmonary nodules an multifocal spiculated soft tissue densities throughout all lobes of the lung, many of which demonstrate somewhat peripheral distribution. Differential considerations include septic emboli, multifocal foci of infection or pneumonitis, and unusual entity such as sarcoidosis. Neoplasm or metastatic foci remain in the differential but are felt less likely given the rapidity of development of the findings. 2. Small pericardial effusion, unchanged. Electronically Signed   By: Donavan Foil M.D.   On: 09/21/2016 03:29   Ct Angio Chest Pe W And/or Wo Contrast  Result Date: 09/13/2016 CLINICAL DATA:  Initial evaluation for acute shortness of breath. History of prostate cancer. EXAM: CT ANGIOGRAPHY CHEST WITH CONTRAST TECHNIQUE: Multidetector CT imaging of the chest was performed using the standard protocol during bolus administration of intravenous contrast. Multiplanar CT image reconstructions and MIPs were obtained to evaluate the vascular anatomy. CONTRAST:  100 cc of Isovue 370. COMPARISON:  Prior radiograph from earlier the same day. FINDINGS: Cardiovascular: Intrathoracic aorta of normal caliber without evidence for aneurysm or other acute abnormality. Visualized great vessels within normal limits. Mild cardiomegaly present. Scattered coronary artery calcifications noted. Small pericardial fusion. Pulmonary arterial tree adequately opacified for evaluation. Main pulmonary artery measures at the upper limits of normal  for size at 3 cm in diameter. No filling defect to suggest acute pulmonary embolism. Re-formatted imaging confirms these findings. Mediastinum/Nodes: Thyroid normal. No pathologically enlarged mediastinal, hilar, or axillary lymph nodes identified. Esophagus within normal limits. Lungs/Pleura: Small layering bilateral pleural effusions present. Mild scattered interlobular septal thickening within the lungs, suggesting mild pulmonary interstitial edema. Underlying emphysema. No focal infiltrates. No pneumothorax. No worrisome pulmonary nodule or mass. Few scattered granulomas noted within the left lung base. Upper Abdomen: 1.9 cm hypodense lesion within the liver compatible with a cyst. Additional sub cm hypodensity within the right hepatic lobe noted, too small the characterize, but may reflect a small cyst as well. Probable small cyst noted within the right kidney. Remainder the visualized upper abdomen otherwise unremarkable. Musculoskeletal: No acute osseous abnormality. No worrisome lytic or blastic osseous lesion. Scattered mild degenerative changes within the mid thoracic spine. Review of the MIP images confirms the above findings. IMPRESSION: 1. No CT evidence for acute pulmonary embolism. 2. Mild diffuse interlobular septal thickening, suggestive of mild pulmonary interstitial edema. 3. Small layering bilateral pleural effusions. 4. Mild cardiomegaly with associated small pericardial fusion. 5. Emphysema. Electronically Signed   By: Jeannine Boga M.D.   On: 09/13/2016 00:02   Ct Abdomen Pelvis W Contrast  Result Date: 09/22/2016 CLINICAL DATA:  Leukocytosis, sepsis. History of prostate cancer. Evaluate for pelvic abscess. EXAM: CT ABDOMEN AND PELVIS WITH CONTRAST TECHNIQUE: Multidetector CT imaging of the abdomen and pelvis was performed using the standard protocol following bolus administration of intravenous contrast. CONTRAST:  100 mL Isovue 300 IV COMPARISON:  Correlation with CT chest dated  09/20/2016 FINDINGS: Lower chest: Bilateral pulmonary nodules, including a dominant 1.9 cm right lower lobe nodule (series 6/ image 3), unchanged from recent CT. Small bilateral pleural effusions, right greater than left, mildly increased. Hepatobiliary: Scattered hepatic cysts, measuring up to 2.0 cm in the central right liver (series 2/ image 17). Gallbladder is unremarkable. No intrahepatic or extrahepatic ductal dilatation. Pancreas: Within normal limits. Spleen: Within normal limits. Adrenals/Urinary Tract: Adrenal glands are within normal limits. Right renal cysts, including a dominant 3.0 cm right renal sinus cyst (series 2/  image 31). Left kidney is within normal limits. No hydronephrosis. Bladder is within normal limits. Stomach/Bowel: Stomach is within normal limits. No evidence of bowel obstruction. Normal appendix (series 2/ image 51). Vascular/Lymphatic: Atherosclerotic calcifications of the abdominal aorta and branch vessels. No evidence of abdominal aortic aneurysm. No suspicious abdominopelvic lymphadenopathy. Status post pelvic lymphadenectomy. Reproductive: Status post prostatectomy. Other: No abdominopelvic ascites. No drainable fluid collection/abscess. Musculoskeletal: Mild degenerative changes of the visualized thoracolumbar spine. IMPRESSION: No acute findings in the abdomen/pelvis. Specifically, no evidence of drainable fluid collection/abscess. Status post prostatectomy with pelvic lymphadenectomy. No evidence of recurrent or metastatic disease. Multiple bilateral pulmonary nodules, measuring up to 1.9 cm in the right lower lobe, unchanged from recent CT although new from 09/12/2016. Infection such as septic emboli is considered the diagnosis of conclusion. Neoplasm, metastatic disease, and sarcoidosis are not considered likely. Small bilateral pleural effusions, mildly increased. Electronically Signed   By: Julian Hy M.D.   On: 09/22/2016 15:43   Warren Ankle Left W Wo  Contrast  Result Date: 09/18/2016 CLINICAL DATA:  Swelling of the ankles with fever and bacteremia. Scratch/laceration. History of myelodysplastic syndrome. Clinical concern for possible osteomyelitis. EXAM: MRI OF THE LEFT ANKLE WITHOUT AND WITH CONTRAST TECHNIQUE: Multiplanar, multisequence Warren imaging of the ankle was performed before and after the administration of intravenous contrast. CONTRAST:  37m MULTIHANCE GADOBENATE DIMEGLUMINE 529 MG/ML IV SOLN COMPARISON:  09/16/2016 radiographs FINDINGS: Despite efforts by the technologist and patient, motion artifact is present on today's exam and could not be eliminated. This reduces exam sensitivity and specificity. TENDONS Peroneal: Thinning and possibly longitudinal tearing of the peroneus brevis along the lateral malleolus. Posteromedial: Distal tibialis posterior tendinopathy. Anterior: Mild tibialis anterior tendinopathy. Achilles: Unremarkable Plantar Fascia: Thickening of the medial band of the plantar fascia with underlying subcutaneous edema suspicious for plantar fasciitis. Low-level edema within the adjacent flexor digitorum brevis muscle. LIGAMENTS Lateral: Slightly thickened anterior talofibular ligament. Medial: Unremarkable CARTILAGE Ankle Joint: Unremarkable Subtalar Joints/Sinus Tarsi: Unremarkable Bones: No abnormal osseous enhancement are abnormal osseous edema along the ankle to suggest osteomyelitis. Other: No drainable abscess. Low-grade subcutaneous edema along the ankle both anteriorly and posteriorly. IMPRESSION: IMPRESSION 1. No findings of osteomyelitis or abscess. There is low-grade subcutaneous edema around the ankle which is nonspecific -cellulitis not completely excluded. 2. Distal tibialis posterior tendinopathy, correlate clinically in assessing for tibialis posterior dysfunction. 3. Thinning and possible mild longitudinal tearing of the peroneus brevis tendon adjacent to the lateral malleolus. 4. Mild plantar fasciitis. 5.  Suspect remote sprain/mild injury of the anterior talofibular ligament. Electronically Signed   By: WVan ClinesM.D.   On: 09/18/2016 15:40   Ct Biopsy  Result Date: 09/20/2016 CLINICAL DATA:  Myelodysplastic syndrome and need for bone marrow biopsy. EXAM: CT GUIDED BONE MARROW ASPIRATION AND BIOPSY ANESTHESIA/SEDATION: The patient was not sedated. He did receive 125 mcg of IV fentanyl during the procedure. PROCEDURE: The procedure risks, benefits, and alternatives were explained to the patient. Questions regarding the procedure were encouraged and answered. The patient understands and consents to the procedure. A time out was performed prior to initiating the procedure. The right gluteal region was prepped with chlorhexidine. Sterile gown and sterile gloves were used for the procedure. Local anesthesia was provided with 1% Lidocaine. Under CT guidance, an 11 gauge On Control bone cutting needle was advanced from a posterior approach into the right iliac bone. Needle positioning was confirmed with CT. Initial non heparinized and heparinized aspirate samples were obtained of bone marrow. Core  biopsy was performed via the On Control drill needle. COMPLICATIONS: None FINDINGS: Inspection of initial aspirate did reveal visible particles. Intact core biopsy sample was obtained. IMPRESSION: CT guided bone marrow biopsy of right posterior iliac bone with both aspirate and core samples obtained. Electronically Signed   By: Aletta Edouard M.D.   On: 09/20/2016 12:28   Dg Chest Port 1 View  Result Date: 09/15/2016 CLINICAL DATA:  75 year old male with fever. History of prostate cancer and melanoma with chemotherapy. EXAM: PORTABLE CHEST 1 VIEW COMPARISON:  Chest CT dated 09/12/2016 FINDINGS: Single-view of the chest demonstrate mild diffuse interstitial prominence. There is no focal consolidation, pleural effusion, or pneumothorax. The cardiac silhouette is within normal limits. No acute osseous pathology.  IMPRESSION: No acute cardiopulmonary process. Electronically Signed   By: Anner Crete M.D.   On: 09/15/2016 00:42   Transthoracic Echocardiography  Patient:    Warren [name], Warren [name] Warren #:       [id number] Study Date: 09/13/2016 Gender:     M Age:        3 Height:     170.2 cm Weight:     75.4 kg BSA:        1.9 m^2 Pt. Status: Room:       1441   ADMITTING    Tyrone Apple N  SONOGRAPHER  Tresa Res, [place], [place]  PERFORMING   [facility], Inpatient  cc:  ------------------------------------------------------------------- LV EF: 60% -   65%  ------------------------------------------------------------------- Indications:      CHF - 428.0.  ------------------------------------------------------------------- History:   PMH:   Congestive heart failure.  Chronic obstructive pulmonary disease.  Risk factors:  Former tobacco use. Hypertension.  ------------------------------------------------------------------- Study Conclusions  - Left ventricle: The cavity size was normal. Systolic function was   normal. The estimated ejection fraction was in the range of 60%   to 65%. Wall motion was normal; there were no regional wall   motion abnormalities. Left ventricular diastolic function   parameters were normal. - Aortic valve: Trileaflet; mildly thickened, mildly calcified   leaflets. There was trivial regurgitation. - Mitral valve: There was trivial regurgitation. - Left atrium: The atrium was mildly dilated. - Pulmonary arteries: PA peak pressure: 38 mm Hg (S). - Pericardium, extracardiac: A trivial, free-flowing pericardial   effusion was identified along the right ventricular free wall.   The fluid had no internal echoes.  Impressions:  - The right ventricular systolic pressure was increased consistent   with mild pulmonary hypertension.  ASSESSMENT & PLAN:  75 year old Caucasian male with  1)  Myelodysplastic syndrome (RAEB-2 with about 14-15% blasts) ISS-R score of 7 (Very High Risk) Cytogenetics show trisomy 8 (intermediate risk ) Presented with Subacute Anemia (with high normal MCV) + leukopenia/Neutropenia - new since September 2016. Patient has normal platelet counts at this time. Foundation one results reviewed patient has TET2, ASXL1, BCOR RUNX1 SETBP1 SRSF2 and STAG2 mutations identified. Repeat after his bone marrow on 09/20/2016 -- shows similar picture of RAEB-2/MDS with about 6% blasts (slightly lower)  2) Febrile neutropenia due to MSSA bacteremia with CT chest showing signs of spetic emboli. CT abd/pelvis with no abscesses. Patient has had persistent neutropenia for several months and was on prophylactic Ciprofloxacin/Posaconazole and Acyclovir. Blood cultures note MSSA bacteremia. TTE Negative for endocarditis . Xray of left ankle neg for osteomyelitis. 3) symptomatic anemia . Hg now back down to 6.9  4) Thrombocytopenia - platelet  counts lower than baseline due to MSSA sepsis and possibly vancomycin ... Now switched to cefazolin .PLT stable Plan -continue Cefazolin for 4 weeks as per ID recommendations -BM Bx results noted -patient hopes to make it to his [facility] appointment with Dr Royce Macadamia to discuss BM Bx results to determine other treatment options vs continued Vidaza. -I have message my RN to fax BM Bx results to Dr Ok Edwards and Dr Royce Macadamia at [place] give patient a print of bone marrow biopsy results as well so he can carry them. -transfuse irradiated CMV neg PRBC's (use only irradiated and CMV neg products) as needed for hgb<8 or if symptomatic  5) Slightly elevated bilirubin level even before treatment (?GIlbert's) - stable, slightly higher in the setting of infection.  I spent 30 minutes counseling the patient face to face. The total time spent in the appointment was 35 minutes and more than 50% was on counseling and direct patient cares.     Sullivan Lone MD [place] AAHIVMS [facility] Hematology/Oncology Physician [facility]  (Office):       [phone] (Work cell):  [phone] (Fax):           [phone]

## 2016-09-22 NOTE — Progress Notes (Signed)
Name: Warren [name] MRN: [id number] DOB: 13-Jun-1941    ADMISSION DATE:  09/14/2016 CONSULTATION DATE: 10/24  REFERRING MD :  [place] Pink   CHIEF COMPLAINT:  Dyspnea, immunocompromised. ? bronch  BRIEF PATIENT DESCRIPTION:  75 year old male w/ MDS, admitted w/ neutropenia MSSA bacteremia. PCCM asked to see on 10/24  Due to on-going fever and thoughts on bronchoscopy in this setting.   SIGNIFICANT EVENTS  Bone narrow biopsy 10/24>>>  STUDIES:  MR ankle: negative for osteo.  TEE 10/24>>> negative for vegetation CT chest 10/24 > multiple nodules in a bronchovascular distribution  BRIEF: 75 y/o male with months long neutropenia related to MDS and chemotherapy in with MSSA bacteremia.  PCCM consulted for evaluation of cough, ongoing fevers, dyspnea.  Subjective:  cough improved Dyspnea improved  VITAL SIGNS: Temp:  [97.7 F (36.5 C)-99.3 F (37.4 C)] 98.1 F (36.7 C) (10/26 1020) Pulse Rate:  [70-78] 70 (10/26 1020) Resp:  [16-22] 16 (10/26 1020) BP: (135-150)/(66-69) 144/66 (10/26 1020) SpO2:  [96 %-100 %] 98 % (10/26 1020) Room air  PHYSICAL EXAMINATION: General:  Resting comfortably in bed HENT: NCAT OP clear PULM: few crackles left base, normal effort CV: RRR, no mgr GI: BS+, soft, nontender MSK: normal bulk and tone, significant ankle edema today   Recent Labs Lab 09/18/16 0446 09/20/16 0438 09/21/16 0430  NA 137 136 138  K 3.2* 3.9 3.6  CL 102 104 103  CO2 25 25 26   BUN 16 19 19   CREATININE 0.99 1.09 0.94  GLUCOSE 119* 119* 114*    Recent Labs Lab 09/20/16 0438 09/21/16 0430 09/22/16 0540  HGB 8.0* 7.1* 6.9*  HCT 26.7* 23.7* 23.1*  WBC 2.2* 2.1* 1.5*  PLT 87* 87* 83*   Ct Chest Wo Contrast  Result Date: 09/21/2016 CLINICAL DATA:  Abnormal chest x-ray EXAM: CT CHEST WITHOUT CONTRAST TECHNIQUE: Multidetector CT imaging of the chest was performed following the standard protocol without IV contrast. COMPARISON:  Radiograph 09/17/2016, CTA chest  09/12/2016 FINDINGS: Cardiovascular: Limited without intravenous contrast. Right-sided central venous catheter tip terminates at the distal SVC. Scattered calcifications within the thoracic aorta ectasia of the ascending segment measuring 3.7 cm. , descending thoracic aorta measures 3.4 cm. There is cardiomegaly no significant interval change in small pericardial effusion. Mediastinum/Nodes: Mild mediastinal adenopathy. Pretracheal node measures 8 mm. Trachea and mainstem bronchi appear normal. Imaged thyroid gland within normal limits. Mild air distention of the esophagus. Lungs/Pleura: Interim development of small bilateral right greater than left pleural effusions. Calcified granuloma within the left lower lobe. Interim development of multifocal pulmonary nodules and spiculated soft tissue densities within the lower lobes, right middle lobe, and bilateral upper lobes. Upper Abdomen: Stable cyst in the liver. No acute abnormality in the upper abdomen. Musculoskeletal: No acute osseous abnormality. IMPRESSION: 1. Interim development of small bilateral pleural effusions. Interim development of multiple bilateral small pulmonary nodules an multifocal spiculated soft tissue densities throughout all lobes of the lung, many of which demonstrate somewhat peripheral distribution. Differential considerations include septic emboli, multifocal foci of infection or pneumonitis, and unusual entity such as sarcoidosis. Neoplasm or metastatic foci remain in the differential but are felt less likely given the rapidity of development of the findings. 2. Small pericardial effusion, unchanged. Electronically Signed   By: Donavan Foil M.D.   On: 09/21/2016 03:29    ASSESSMENT / PLAN: MSSA bacteremia in Neutropenic host Neutropenic fever  MDS Pancytopenia  Dyspnea  H/o volume overload H/o COPD but most recent PFTs with  minimal airflow obstruction Multiple pulmonary nodules on CT chest Cough Mild volume overload with  ankle edema 09/22/2016  Discussion  CT discussion from 09/21/2016 note. I feel that the findings in his lung represent septic emboli. Viral panel is negative. He does have some mild ankle edema and anemia today which contribute to his dyspnea.   Plan Sputum culture Lasix 1 dose today Continue anti-staph therapy as directed by ID Will hold off on bronchoscopy Will follow  Case discussed with son (pulmonologist in [place]) in detail 10/25  Roselie Awkward, MD [facility] Pager: [phone] Cell: [phone] After 3pm or if no response, call [phone]   09/22/2016, 1:23 PM

## 2016-09-22 NOTE — Progress Notes (Signed)
Previous unit of blood given on 09/16/2016 not completed in epic. today's unit  Merged onto same column with previous unit.

## 2016-09-23 ENCOUNTER — Inpatient Hospital Stay (HOSPITAL_COMMUNITY): Payer: Medicare Other

## 2016-09-23 ENCOUNTER — Telehealth: Payer: Self-pay | Admitting: *Deleted

## 2016-09-23 DIAGNOSIS — R911 Solitary pulmonary nodule: Secondary | ICD-10-CM

## 2016-09-23 DIAGNOSIS — R079 Chest pain, unspecified: Secondary | ICD-10-CM

## 2016-09-23 DIAGNOSIS — R071 Chest pain on breathing: Secondary | ICD-10-CM

## 2016-09-23 DIAGNOSIS — Z9889 Other specified postprocedural states: Secondary | ICD-10-CM

## 2016-09-23 DIAGNOSIS — J90 Pleural effusion, not elsewhere classified: Secondary | ICD-10-CM

## 2016-09-23 LAB — CBC WITH DIFFERENTIAL/PLATELET
BASOS ABS: 0 10*3/uL (ref 0.0–0.1)
BASOS PCT: 0 %
EOS ABS: 0 10*3/uL (ref 0.0–0.7)
Eosinophils Relative: 1 %
HCT: 25.2 % — ABNORMAL LOW (ref 39.0–52.0)
HEMOGLOBIN: 7.9 g/dL — AB (ref 13.0–17.0)
LYMPHS PCT: 59 %
Lymphs Abs: 0.7 10*3/uL (ref 0.7–4.0)
MCH: 28.9 pg (ref 26.0–34.0)
MCHC: 31.3 g/dL (ref 30.0–36.0)
MCV: 92.3 fL (ref 78.0–100.0)
MONOS PCT: 7 %
Monocytes Absolute: 0.1 10*3/uL (ref 0.1–1.0)
NEUTROS ABS: 0.4 10*3/uL — AB (ref 1.7–7.7)
NEUTROS PCT: 33 %
PLATELETS: 77 10*3/uL — AB (ref 150–400)
RBC: 2.73 MIL/uL — ABNORMAL LOW (ref 4.22–5.81)
RDW: 18.6 % — ABNORMAL HIGH (ref 11.5–15.5)
WBC: 1.2 10*3/uL — CL (ref 4.0–10.5)

## 2016-09-23 LAB — BODY FLUID CELL COUNT WITH DIFFERENTIAL
Lymphs, Fluid: 33 %
Monocyte-Macrophage-Serous Fluid: 63 % (ref 50–90)
NEUTROPHIL FLUID: 4 % (ref 0–25)
WBC FLUID: 699 uL (ref 0–1000)

## 2016-09-23 LAB — TYPE AND SCREEN
ABO/RH(D): O NEG
Antibody Screen: NEGATIVE
Unit division: 0

## 2016-09-23 LAB — CHOLESTEROL, TOTAL: CHOLESTEROL: 70 mg/dL (ref 0–200)

## 2016-09-23 LAB — PROTEIN, BODY FLUID: Total protein, fluid: <3 g/dL

## 2016-09-23 LAB — GLUCOSE, SEROUS FLUID: Glucose, Fluid: 120 mg/dL

## 2016-09-23 LAB — PROTEIN, TOTAL: TOTAL PROTEIN: 5.8 g/dL — AB (ref 6.5–8.1)

## 2016-09-23 LAB — LACTATE DEHYDROGENASE, PLEURAL OR PERITONEAL FLUID: LD FL: 236 U/L — AB (ref 3–23)

## 2016-09-23 LAB — LACTATE DEHYDROGENASE: LDH: 287 U/L — ABNORMAL HIGH (ref 98–192)

## 2016-09-23 MED ORDER — CEFAZOLIN SODIUM-DEXTROSE 2-4 GM/100ML-% IV SOLN: 2.0000 g | Freq: Three times a day (TID) | INTRAVENOUS | 0 refills | Status: AC

## 2016-09-23 MED ORDER — FUROSEMIDE 20 MG PO TABS
20.0000 mg | ORAL_TABLET | Freq: Once | ORAL | Status: AC
  Administered 2016-09-24: 20 mg via ORAL
  Filled 2016-09-23: qty 1

## 2016-09-23 NOTE — Progress Notes (Signed)
Lab called with a critical WBC 1.2.  Notified Dr. Glenis Smoker.  No new order.

## 2016-09-23 NOTE — Care Management Important Message (Signed)
Important Message  Patient Details  Name: Warren [name] MRN: [id number] Date of Birth: 08-08-1941   Medicare Important Message Given:  Yes    Camillo Flaming 09/23/2016, 9:04 AMImportant Message  Patient Details  Name: Warren [name] MRN: [id number] Date of Birth: [birth year]   Medicare Important Message Given:  Yes    Camillo Flaming 09/23/2016, 9:04 AM

## 2016-09-23 NOTE — Progress Notes (Signed)
Patient complained of shortness of breath. Writer offered patient nebulizer treatment. Patient refused stating he was waiting on the pulmonologist to visit him later today.  Diminished lung sounds noted to right side. SpO2 at 98% on room air. Patient does not appear to be in any distress. Continues to rest in recliner chair. Will monitor.

## 2016-09-23 NOTE — Progress Notes (Signed)
[facility]  Teaching for IV Ancef with Mrs. Pfahler yesterday and she did very well. [facility] is prepared to support DC home when MD orders.  If DC is over the weekend, [facility] will need the script for the Ancef.  Please alert the [facility] Transition to Home Specialist to coordinate the DC.   If patient discharges after hours, please call [phone].   Larry Sierras 09/23/2016, 3:25 PM

## 2016-09-23 NOTE — Progress Notes (Signed)
Patient had thoracentesis performed to right lung. After procedure patient continues to be alert and oriented. Patient reports some relief of his complaint of shortness of breath after fluid removal, however, still has some pain to right side of chest, but not bothersome at this time. Diminished lung sounds to right side remain. Will continue to monitor patient for progress.

## 2016-09-23 NOTE — Discharge Summary (Addendum)
Physician Discharge Summary  Warren [name] XNA:[id number] DOB: 1941/04/06 DOA: 09/14/2016  PCP: Wenda Low, MD  Admit date: 09/14/2016 Discharge date: 09/26/2016  Time spent: > 35 minutes  Recommendations for Outpatient Follow-up:  1. 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 Patient was discharged home on 10/18 and then subsequently developed fever and chills at home and so he came back to the hospital. Patient found to have neutropenia. He was hospitalized for further management. Blood Cultures returned positive for staph aureus  Hospital Course:  Neutropenic fever/Staphylococcus aureus bacteremia Patient's blood cultures are positive for staph aureus. Infectious disease is following. Cultures have been repeated 10/21 which have not shown any growth so far. Patient is currently on Ancef. Vancomycin and cefepime were discontinued. Tamiflu was also discontinued. Influenza PCR was negative. Source of this infection is not entirely clear, although patient did have cellulitis involving his left ankle area recently. CT of abdomen and pelvis was negative for source MRI of the ankle did  not show any obvious abscess. No evidence for osteomyelitis. Nonspecific tendon changes were noted. Patient recalls ankle injury remotely. No recent injury. patient is status post TEE which was negative for vegetations. Continue patient's home dose of posaconazole and valacyclovir. PICC line inserted.  Antibiotic plan outlined by infectious disease specialist, CT scan of abdomen and pelvis negative  Myelodysplastic syndrome with pancytopenia Being followed by Dr. Melchor Amour also at [facility] for possible stem cell transplant. Patient underwent bone marrow biopsy earlier today. Continue to monitor CBC. Neutrophil counts appear to have improved. He did receive 1 unit of blood ordered by his hematologist. Platelet counts noted to be low but stable. No evidence for bleeding. Continue to hold Lovenox.  Recently admitted for fluid overload Most likely from chemotherapy related. Echocardiogram showed preserved LV function with no diastolic dysfunction. He appears to be quite euvolemic at this time. BNP is normal.   Dyspnea in the setting of known COPD Pulmonary on board  Hypokalemia Repleted. And wnl on last check  Procedures:  Bone Marrow biopsy  TEE: negative for vegetations  Consultations:  Pulmonologist   Oncology  ID  Discharge Exam: Vitals:   09/25/16 1400 09/25/16 2045  BP: (!) 147/77 (!) 146/73  Pulse: 66 68  Resp: 16 20  Temp: 98.5 F (36.9 C) 98.7 F (37.1 C)    General: Pt in nad, alert and awake Cardiovascular: rrr, no rubs  Respiratory: no increased wob, decrease breath sounds at bases  Discharge Instructions   Discharge Instructions    Type and screen    Complete by:  Sep 15, 2016    [place] order/instruction  Complete by:  As directed    Transfuse Parameters   Complete patient signature process for consent form    Complete by:  As directed    Diet - low sodium heart healthy    Complete by:  As directed    Diet  - low sodium heart healthy    Complete by:  As directed    Diet - low sodium heart healthy    Complete by:  As directed    Discharge instructions    Complete by:  As directed    Pt to f/u with pulmonologist and oncologist after hospital discharge   Increase activity slowly    Complete by:  As directed    Increase activity slowly    Complete by:  As directed    Increase activity slowly    Complete by:  As directed    Practitioner attestation of consent    Complete by:  As directed    I, the ordering practitioner, attest that I have discussed with the patient the benefits, risks, side effects, alternatives, likelihood of achieving goals and potential problems during recovery for the procedure listed.   Procedure:  Blood Product(s)     Current Discharge Medication List    START taking these medications   Details  ceFAZolin (ANCEF) 2-4 GM/100ML-% IVPB Inject 100 mLs (2 g total) into the vein every 8 (eight) hours. Qty: 1 each, Refills: 0      CONTINUE these medications which have NOT CHANGED   Details  calcium carbonate (TUMS - DOSED IN MG ELEMENTAL CALCIUM) 500 MG chewable tablet Chew 1 tablet by mouth at bedtime.     chlorhexidine (PERIDEX) 0.12 % solution RINSE 10 ML (CC) IN MOUTH TWICE DAILY AS DIRECTED **SWISH  AND  SPIT,  DO  NOT  SWALLOW** Qty: 473 mL, Refills: 1    furosemide (LASIX) 20 MG tablet Take 1 tablet (20 mg total) by mouth daily. Qty: 30 tablet, Refills: 0    magic mouthwash SOLN Take 5 mLs by mouth 4 (four) times daily as needed for mouth pain. Qty: 480 mL, Refills: 0    Multiple Vitamin (MULTIVITAMIN WITH MINERALS) TABS tablet Take 1 tablet by mouth daily.    omeprazole (PRILOSEC) 40 MG capsule Take 40 mg by mouth daily before lunch. 1/2 hour before.    ondansetron (ZOFRAN) 4 MG tablet Take 4 mg by mouth every 8 (eight) hours as needed for nausea or vomiting.     posaconazole (NOXAFIL) 100 MG TBEC delayed-release tablet Take 3 tablets (300 mg total) by  mouth daily. Qty: 90 tablet, Refills: 0    potassium chloride (K-DUR) 10 MEQ tablet Take 2 tablets (20 mEq total) by mouth daily. Qty: 60 tablet, Refills: 0    senna-docusate (SENNA S) 8.6-50 MG tablet Take 2 tablets by mouth 2 (two) times daily as needed for mild constipation or moderate constipation.    valACYclovir (VALTREX) 500 MG tablet Take 1 tablet (500 mg total) by mouth 2 (two) times daily. At night to suppress herpes gingivitis Qty: 60 tablet, Refills: 1    valsartan (DIOVAN) 80 MG tablet Take 40 mg by mouth at bedtime.     nitroGLYCERIN (NITROSTAT) 0.4 MG SL tablet Place 1 tablet (0.4 mg total) under the tongue every 5 (five) minutes as needed for chest pain. Qty: 25 tablet, Refills: 1      STOP taking these medications     ciprofloxacin (CIPRO) 500 MG tablet        Allergies  Allergen Reactions  . Fluconazole     Rash   . Midazolam Other (See Comments)    Agitated caused by versed   Follow-up Information    Rexene Edison, NP Follow up on 09/29/2016.   Specialty:  Pulmonary Disease Why:  check in at 130pm, then you will have Chest xray  then see Tammy at 2.  Contact information: [address] [place] [address] [phone]            The results of significant diagnostics from this hospitalization (including imaging, microbiology, ancillary and laboratory) are listed below for reference.    Significant Diagnostic Studies: Dg Chest 2 View  Result Date: 09/26/2016 CLINICAL DATA:  Pleural effusion. Shortness of breath. Congestive heart failure. Neutropenic fever. Sepsis. Myelodysplastic syndrome. EXAM: CHEST  2 VIEW COMPARISON:  09/24/2016 FINDINGS: The heart size and mediastinal contours are within normal limits. Right arm PICC line remains in stable position. Mild to moderate diffuse interstitial infiltrates show no significant change. Tiny bilateral pleural effusions are also unchanged. No evidence of pulmonary consolidation or pneumothorax. Aortic  atherosclerosis. IMPRESSION: No significant change in diffuse interstitial infiltrates and tiny bilateral pleural effusions. This is suspicious for pulmonary edema. Atypical infection considered less likely but cannot be excluded. Electronically Signed   By: Earle Gell M.D.   On: 09/26/2016 11:50   Dg Chest 2 View  Result Date: 09/23/2016 CLINICAL DATA:  Chest pain. EXAM: CHEST  2 VIEW COMPARISON:  09/17/2016 and chest CT 09/20/2016 FINDINGS: Interval placement of right-sided PICC line with tip just below the cavoatrial junction. Lungs are adequately inflated demonstrate slight worsening of mixed interstitial airspace density over the lung bases with patchy perihilar opacification. Findings are likely due to infection. Small amount of bilateral pleural fluid. Mild stable cardiomegaly. Minimal calcified plaque over the aortic arch. Mild degenerate changes spine with mild stable anterior wedging of a mid thoracic vertebral body. IMPRESSION: Worsening bibasilar opacification and patchy perihilar opacification suggesting infection. Small amount of bilateral pleural fluid. Mild stable cardiomegaly. Right-sided PICC line with tip just below the region of the cavoatrial junction. Electronically Signed   By: Marin Olp M.D.   On: 09/23/2016 13:46   Dg Chest 2 View  Result Date: 09/17/2016 CLINICAL DATA:  Cough and shortness of Breath EXAM: CHEST  2 VIEW COMPARISON:  09/14/2016 FINDINGS: Cardiac shadow is within normal limits. Aortic calcifications are again seen. Mild increased vascular congestion with interstitial edema is noted. Some patchy changes are noted in the right lung base which were not well seen on the prior exam consistent with atelectasis. IMPRESSION: Mild increase in vascular congestion with interstitial edema. Mild right basilar atelectasis. Electronically Signed   By: Inez Catalina M.D.   On: 09/17/2016 09:04   Dg Chest 2 View  Result Date: 09/12/2016 CLINICAL DATA:  Neutropenic patient  with dyspnea and ankle swelling. Patient on chemotherapy. History of prostate cancer. EXAM: CHEST  2 VIEW COMPARISON:  07/17/2016 chest radiograph obtained 02/28/2013 chest CT. FINDINGS: The heart size and mediastinal contours are within normal limits. Both lungs are clear. Surgical screw is seen over the left humeral head. Stable T6 through T8 mild compression deformities since 2014 comparison accounting for mild mid thoracic kyphosis. No apparent blastic disease. IMPRESSION: No active cardiopulmonary disease. Electronically Signed   By: Ashley Royalty M.D.   On: 09/12/2016 18:02   Dg Ankle Complete Left  Result Date: 09/16/2016 CLINICAL DATA:  Cellulitis. EXAM: LEFT ANKLE COMPLETE - 3+ VIEW COMPARISON:  No recent prior.  FINDINGS: No acute bony or joint abnormality. No evidence of fracture dislocation. Peripheral vascular calcification. IMPRESSION: No acute bony abnormality.  Peripheral vascular disease. Electronically Signed   By: Marcello Moores  Register   On: 09/16/2016 12:32   Ct Chest Wo Contrast  Result Date: 09/21/2016 CLINICAL DATA:  Abnormal chest x-ray EXAM: CT CHEST WITHOUT CONTRAST TECHNIQUE: Multidetector CT imaging of the chest was performed following the standard protocol without IV contrast. COMPARISON:  Radiograph 09/17/2016, CTA chest 09/12/2016 FINDINGS: Cardiovascular: Limited without intravenous contrast. Right-sided central venous catheter tip terminates at the distal SVC. Scattered calcifications within the thoracic aorta ectasia of the ascending segment measuring 3.7 cm. , descending thoracic aorta measures 3.4 cm. There is cardiomegaly no significant interval change in small pericardial effusion. Mediastinum/Nodes: Mild mediastinal adenopathy. Pretracheal node measures 8 mm. Trachea and mainstem bronchi appear normal. Imaged thyroid gland within normal limits. Mild air distention of the esophagus. Lungs/Pleura: Interim development of small bilateral right greater than left pleural effusions.  Calcified granuloma within the left lower lobe. Interim development of multifocal pulmonary nodules and spiculated soft tissue densities within the lower lobes, right middle lobe, and bilateral upper lobes. Upper Abdomen: Stable cyst in the liver. No acute abnormality in the upper abdomen. Musculoskeletal: No acute osseous abnormality. IMPRESSION: 1. Interim development of small bilateral pleural effusions. Interim development of multiple bilateral small pulmonary nodules an multifocal spiculated soft tissue densities throughout all lobes of the lung, many of which demonstrate somewhat peripheral distribution. Differential considerations include septic emboli, multifocal foci of infection or pneumonitis, and unusual entity such as sarcoidosis. Neoplasm or metastatic foci remain in the differential but are felt less likely given the rapidity of development of the findings. 2. Small pericardial effusion, unchanged. Electronically Signed   By: Donavan Foil M.D.   On: 09/21/2016 03:29   Ct Angio Chest Pe W And/or Wo Contrast  Result Date: 09/13/2016 CLINICAL DATA:  Initial evaluation for acute shortness of breath. History of prostate cancer. EXAM: CT ANGIOGRAPHY CHEST WITH CONTRAST TECHNIQUE: Multidetector CT imaging of the chest was performed using the standard protocol during bolus administration of intravenous contrast. Multiplanar CT image reconstructions and MIPs were obtained to evaluate the vascular anatomy. CONTRAST:  100 cc of Isovue 370. COMPARISON:  Prior radiograph from earlier the same day. FINDINGS: Cardiovascular: Intrathoracic aorta of normal caliber without evidence for aneurysm or other acute abnormality. Visualized great vessels within normal limits. Mild cardiomegaly present. Scattered coronary artery calcifications noted. Small pericardial fusion. Pulmonary arterial tree adequately opacified for evaluation. Main pulmonary artery measures at the upper limits of normal for size at 3 cm in  diameter. No filling defect to suggest acute pulmonary embolism. Re-formatted imaging confirms these findings. Mediastinum/Nodes: Thyroid normal. No pathologically enlarged mediastinal, hilar, or axillary lymph nodes identified. Esophagus within normal limits. Lungs/Pleura: Small layering bilateral pleural effusions present. Mild scattered interlobular septal thickening within the lungs, suggesting mild pulmonary interstitial edema. Underlying emphysema. No focal infiltrates. No pneumothorax. No worrisome pulmonary nodule or mass. Few scattered granulomas noted within the left lung base. Upper Abdomen: 1.9 cm hypodense lesion within the liver compatible with a cyst. Additional sub cm hypodensity within the right hepatic lobe noted, too small the characterize, but may reflect a small cyst as well. Probable small cyst noted within the right kidney. Remainder the visualized upper abdomen otherwise unremarkable. Musculoskeletal: No acute osseous abnormality. No worrisome lytic or blastic osseous lesion. Scattered mild degenerative changes within the mid thoracic spine. Review of the MIP images confirms the above  findings. IMPRESSION: 1. No CT evidence for acute pulmonary embolism. 2. Mild diffuse interlobular septal thickening, suggestive of mild pulmonary interstitial edema. 3. Small layering bilateral pleural effusions. 4. Mild cardiomegaly with associated small pericardial fusion. 5. Emphysema. Electronically Signed   By: Jeannine Boga M.D.   On: 09/13/2016 00:02   Ct Abdomen Pelvis W Contrast  Result Date: 09/22/2016 CLINICAL DATA:  Leukocytosis, sepsis. History of prostate cancer. Evaluate for pelvic abscess. EXAM: CT ABDOMEN AND PELVIS WITH CONTRAST TECHNIQUE: Multidetector CT imaging of the abdomen and pelvis was performed using the standard protocol following bolus administration of intravenous contrast. CONTRAST:  100 mL Isovue 300 IV COMPARISON:  Correlation with CT chest dated 09/20/2016 FINDINGS:  Lower chest: Bilateral pulmonary nodules, including a dominant 1.9 cm right lower lobe nodule (series 6/ image 3), unchanged from recent CT. Small bilateral pleural effusions, right greater than left, mildly increased. Hepatobiliary: Scattered hepatic cysts, measuring up to 2.0 cm in the central right liver (series 2/ image 17). Gallbladder is unremarkable. No intrahepatic or extrahepatic ductal dilatation. Pancreas: Within normal limits. Spleen: Within normal limits. Adrenals/Urinary Tract: Adrenal glands are within normal limits. Right renal cysts, including a dominant 3.0 cm right renal sinus cyst (series 2/ image 31). Left kidney is within normal limits. No hydronephrosis. Bladder is within normal limits. Stomach/Bowel: Stomach is within normal limits. No evidence of bowel obstruction. Normal appendix (series 2/ image 51). Vascular/Lymphatic: Atherosclerotic calcifications of the abdominal aorta and branch vessels. No evidence of abdominal aortic aneurysm. No suspicious abdominopelvic lymphadenopathy. Status post pelvic lymphadenectomy. Reproductive: Status post prostatectomy. Other: No abdominopelvic ascites. No drainable fluid collection/abscess. Musculoskeletal: Mild degenerative changes of the visualized thoracolumbar spine. IMPRESSION: No acute findings in the abdomen/pelvis. Specifically, no evidence of drainable fluid collection/abscess. Status post prostatectomy with pelvic lymphadenectomy. No evidence of recurrent or metastatic disease. Multiple bilateral pulmonary nodules, measuring up to 1.9 cm in the right lower lobe, unchanged from recent CT although new from 09/12/2016. Infection such as septic emboli is considered the diagnosis of conclusion. Neoplasm, metastatic disease, and sarcoidosis are not considered likely. Small bilateral pleural effusions, mildly increased. Electronically Signed   By: Julian Hy M.D.   On: 09/22/2016 15:43   Mr Ankle Left W Wo Contrast  Result Date:  09/18/2016 CLINICAL DATA:  Swelling of the ankles with fever and bacteremia. Scratch/laceration. History of myelodysplastic syndrome. Clinical concern for possible osteomyelitis. EXAM: MRI OF THE LEFT ANKLE WITHOUT AND WITH CONTRAST TECHNIQUE: Multiplanar, multisequence MR imaging of the ankle was performed before and after the administration of intravenous contrast. CONTRAST:  57m MULTIHANCE GADOBENATE DIMEGLUMINE 529 MG/ML IV SOLN COMPARISON:  09/16/2016 radiographs FINDINGS: Despite efforts by the technologist and patient, motion artifact is present on today's exam and could not be eliminated. This reduces exam sensitivity and specificity. TENDONS Peroneal: Thinning and possibly longitudinal tearing of the peroneus brevis along the lateral malleolus. Posteromedial: Distal tibialis posterior tendinopathy. Anterior: Mild tibialis anterior tendinopathy. Achilles: Unremarkable Plantar Fascia: Thickening of the medial band of the plantar fascia with underlying subcutaneous edema suspicious for plantar fasciitis. Low-level edema within the adjacent flexor digitorum brevis muscle. LIGAMENTS Lateral: Slightly thickened anterior talofibular ligament. Medial: Unremarkable CARTILAGE Ankle Joint: Unremarkable Subtalar Joints/Sinus Tarsi: Unremarkable Bones: No abnormal osseous enhancement are abnormal osseous edema along the ankle to suggest osteomyelitis. Other: No drainable abscess. Low-grade subcutaneous edema along the ankle both anteriorly and posteriorly. IMPRESSION: IMPRESSION 1. No findings of osteomyelitis or abscess. There is low-grade subcutaneous edema around the ankle which is nonspecific -cellulitis  not completely excluded. 2. Distal tibialis posterior tendinopathy, correlate clinically in assessing for tibialis posterior dysfunction. 3. Thinning and possible mild longitudinal tearing of the peroneus brevis tendon adjacent to the lateral malleolus. 4. Mild plantar fasciitis. 5. Suspect remote sprain/mild  injury of the anterior talofibular ligament. Electronically Signed   By: Van Clines M.D.   On: 09/18/2016 15:40   Ct Biopsy  Result Date: 09/20/2016 CLINICAL DATA:  Myelodysplastic syndrome and need for bone marrow biopsy. EXAM: CT GUIDED BONE MARROW ASPIRATION AND BIOPSY ANESTHESIA/SEDATION: The patient was not sedated. He did receive 125 mcg of IV fentanyl during the procedure. PROCEDURE: The procedure risks, benefits, and alternatives were explained to the patient. Questions regarding the procedure were encouraged and answered. The patient understands and consents to the procedure. A time out was performed prior to initiating the procedure. The right gluteal region was prepped with chlorhexidine. Sterile gown and sterile gloves were used for the procedure. Local anesthesia was provided with 1% Lidocaine. Under CT guidance, an 11 gauge On Control bone cutting needle was advanced from a posterior approach into the right iliac bone. Needle positioning was confirmed with CT. Initial non heparinized and heparinized aspirate samples were obtained of bone marrow. Core biopsy was performed via the On Control drill needle. COMPLICATIONS: None FINDINGS: Inspection of initial aspirate did reveal visible particles. Intact core biopsy sample was obtained. IMPRESSION: CT guided bone marrow biopsy of right posterior iliac bone with both aspirate and core samples obtained. Electronically Signed   By: Aletta Edouard M.D.   On: 09/20/2016 12:28   Dg Chest Port 1 View  Result Date: 09/24/2016 CLINICAL DATA:  Pleural effusion, CHF, COPD EXAM: PORTABLE CHEST 1 VIEW COMPARISON:  09/23/2016 FINDINGS: Right-sided PICC line with the tip projecting over the SVC. Trace bilateral pleural effusions. Bilateral diffuse interstitial thickening. No pneumothorax. Stable cardiomediastinal silhouette. No acute osseous abnormality. IMPRESSION: 1. Bilateral interstitial airspace disease consistent concerning for infectious  etiology. Trace bilateral pleural effusions. Electronically Signed   By: Kathreen Devoid   On: 09/24/2016 09:01   Dg Chest Port 1 View  Result Date: 09/23/2016 CLINICAL DATA:  Status post thoracentesis EXAM: PORTABLE CHEST 1 VIEW COMPARISON:  Chest radiograph from earlier today. FINDINGS: Right PICC terminates at the cavoatrial junction. Stable cardiomediastinal silhouette with mild cardiomegaly and aortic atherosclerosis. No pneumothorax. Small left pleural effusion appears stable. No right pleural effusion. Extensive patchy reticular and nodular opacities throughout both lungs have not appreciably changed. IMPRESSION: 1. No pneumothorax. 2. Stable small left pleural effusion. 3. No change in nonspecific extensive patchy reticular and nodular opacities throughout both lungs. Electronically Signed   By: Ilona Sorrel M.D.   On: 09/23/2016 17:04   Dg Chest Port 1 View  Result Date: 09/15/2016 CLINICAL DATA:  75 year old male with fever. History of prostate cancer and melanoma with chemotherapy. EXAM: PORTABLE CHEST 1 VIEW COMPARISON:  Chest CT dated 09/12/2016 FINDINGS: Single-view of the chest demonstrate mild diffuse interstitial prominence. There is no focal consolidation, pleural effusion, or pneumothorax. The cardiac silhouette is within normal limits. No acute osseous pathology. IMPRESSION: No acute cardiopulmonary process. Electronically Signed   By: Anner Crete M.D.   On: 09/15/2016 00:42    Microbiology: Recent Results (from the past 240 hour(s))  Culture, blood (routine x 2)     Status: None   Collection Time: 09/17/16  4:50 AM  Result Value Ref Range Status   Specimen Description BLOOD LEFT ARM  Final   Special Requests BOTTLES DRAWN AEROBIC AND  ANAEROBIC 5CC EACH  Final   Culture   Final    NO GROWTH 5 DAYS Performed at [facility]    Report Status 09/22/2016 FINAL  Final  Culture, blood (routine x 2)     Status: None   Collection Time: 09/17/16  4:55 AM  Result Value  Ref Range Status   Specimen Description BLOOD LEFT HAND  Final   Special Requests BOTTLES DRAWN AEROBIC ONLY 5CC  Final   Culture   Final    NO GROWTH 5 DAYS Performed at [facility]    Report Status 09/22/2016 FINAL  Final  Respiratory Panel by PCR     Status: None   Collection Time: 09/21/16  3:41 PM  Result Value Ref Range Status   Adenovirus NOT DETECTED NOT DETECTED Final   Coronavirus 229E NOT DETECTED NOT DETECTED Final   Coronavirus HKU1 NOT DETECTED NOT DETECTED Final   Coronavirus NL63 NOT DETECTED NOT DETECTED Final   Coronavirus OC43 NOT DETECTED NOT DETECTED Final   Metapneumovirus NOT DETECTED NOT DETECTED Final   Rhinovirus / Enterovirus NOT DETECTED NOT DETECTED Final   Influenza A NOT DETECTED NOT DETECTED Final   Influenza B NOT DETECTED NOT DETECTED Final   Parainfluenza Virus 1 NOT DETECTED NOT DETECTED Final   Parainfluenza Virus 2 NOT DETECTED NOT DETECTED Final   Parainfluenza Virus 3 NOT DETECTED NOT DETECTED Final   Parainfluenza Virus 4 NOT DETECTED NOT DETECTED Final   Respiratory Syncytial Virus NOT DETECTED NOT DETECTED Final   Bordetella pertussis NOT DETECTED NOT DETECTED Final   Chlamydophila pneumoniae NOT DETECTED NOT DETECTED Final   Mycoplasma pneumoniae NOT DETECTED NOT DETECTED Final    Comment: Performed at [facility]  Body fluid culture     Status: None (Preliminary result)   Collection Time: 09/23/16  4:45 PM  Result Value Ref Range Status   Specimen Description PLEURAL  Final   Special Requests Immunocompromised  Final   Gram Stain   Final    MODERATE WBC PRESENT,BOTH PMN AND MONONUCLEAR NO ORGANISMS SEEN    Culture   Final    NO GROWTH 3 DAYS Performed at [facility]    Report Status PENDING  Incomplete     Labs: Basic Metabolic Panel:  Recent Labs Lab 09/20/16 0438 09/21/16 0430 09/25/16 0343 09/26/16 0745  NA 136 138 141  --   K 3.9 3.6 3.0* 3.2*  CL 104 103 104  --   CO2 _0 --    GLUCOSE 119* 114* 106*  --   BUN _1 --   CREATININE 1.09 0.94 1.04  --   CALCIUM 7.9* 7.8* 7.8*  --    Liver Function Tests:  Recent Labs Lab 09/23/16 1900  PROT 5.8*   No results for input(s): LIPASE, AMYLASE in the last 168 hours. No results for input(s): AMMONIA in the last 168 hours. CBC:  Recent Labs Lab 09/22/16 0540 09/23/16 0500 09/24/16 0500 09/25/16 0342 09/26/16 0745  WBC 1.5* 1.2* 1.0* 1.1* 0.9*  NEUTROABS 0.6* 0.4* 0.2* 0.2* 0.1*  HGB 6.9* 7.9* 7.6* 7.7* 7.7*  HCT 23.1* 25.2* 24.6* 24.7* 25.4*  MCV 94.3 92.3 92.5 92.9 93.4  PLT 83* 77* 105* 149* 192   Cardiac Enzymes: No results for input(s): CKTOTAL, CKMB, CKMBINDEX, TROPONINI in the last 168 hours. BNP: BNP (last 3 results)  Recent Labs  09/12/16 1926 09/17/16 0542  BNP 87.7 37.1    ProBNP (last 3  results)  Recent Labs  03/09/16 1510  PROBNP 46.0    CBG: No results for input(s): GLUCAP in the last 168 hours.  Signed:  Velvet Bathe MD.  [facility] 09/26/2016, 2:06 PM   Patient stable for discharge. Had further work up by pulmonologist prior to discharge evaluating effusion. Patient to f/u with pulmonology after hospital discharge for further evaluation and recommendations.  Virgilio Broadhead, [employer]

## 2016-09-23 NOTE — Telephone Encounter (Signed)
BM BX from 10/24 faxed to Dr. Royce Macadamia at [facility] and Son Dr. Jacquelynn Cree per Dr. Irene Limbo request.    Royce Macadamia fax: [phone] Jacquelynn Cree fax:  [phone]

## 2016-09-23 NOTE — Procedures (Signed)
Thoracentesis Procedure Note  Pre-operative Diagnosis: pleural effusion Post-operative Diagnosis: same  Indications: evaluation of pleural fluid   Procedure Details  Consent: Informed consent was obtained. Risks of the procedure were discussed including: infection, bleeding, pain, pneumothorax.  Under sterile conditions the patient was positioned. Betadine solution and sterile drapes were utilized.  1% buffered lidocaine was used to anesthetize the  rib space which was identified via real time [place]. Fluid was obtained without any difficulties and minimal blood loss.  A dressing was applied to the wound and wound care instructions were provided.   Findings 675 ml of rust colored pleural fluid was obtained.   Complications:  None; patient tolerated the procedure well.          Condition: stable  Plan A follow up chest x-ray was ordered.  Erick Colace ACNP-BC [place] Pager # [phone] OR # [phone] if no answer

## 2016-09-23 NOTE — Progress Notes (Signed)
Name: Warren [name] MRN: [id number] DOB: [birth year]    ADMISSION DATE:  09/14/2016 CONSULTATION DATE: 10/24  REFERRING MD :  [place] Pink   CHIEF COMPLAINT:  Dyspnea, immunocompromised. ? bronch  BRIEF PATIENT DESCRIPTION:  75 year old male w/ MDS, admitted w/ neutropenia MSSA bacteremia. PCCM asked to see on 10/24  Due to on-going fever and thoughts on bronchoscopy in this setting.   SIGNIFICANT EVENTS  Bone narrow biopsy 10/24>>>  STUDIES:  MR ankle: negative for osteo.  TEE 10/24>>> negative for vegetation CT chest 10/24 > multiple nodules in a bronchovascular distribution  BRIEF: 75 y/o male with months long neutropenia related to MDS and chemotherapy in with MSSA bacteremia.  PCCM consulted for evaluation of cough, ongoing fevers, dyspnea.  Subjective: Some right sided chest pain overnight Breathing about the same, dyspnea with exertion  VITAL SIGNS: Temp:  [97.9 F (36.6 C)-99.1 F (37.3 C)] 99.1 F (37.3 C) (10/27 0300) Pulse Rate:  [67-78] 78 (10/27 0300) Resp:  [15-16] 15 (10/27 0300) BP: (155-173)/(71-81) 155/81 (10/27 0300) SpO2:  [95 %-100 %] 95 % (10/27 0300) Room air  PHYSICAL EXAMINATION: General:  Resting comfortably in bed HENT: NCAT OP clear PULM: CTA B today, clear throughout CV: RRR, no mgr GI: BS+, soft, nontender MSK: normal bulk and tone, significant ankle edema today   Recent Labs Lab 09/18/16 0446 09/20/16 0438 09/21/16 0430  NA 137 136 138  K 3.2* 3.9 3.6  CL 102 104 103  CO2 25 25 26   BUN 16 19 19   CREATININE 0.99 1.09 0.94  GLUCOSE 119* 119* 114*    Recent Labs Lab 09/21/16 0430 09/22/16 0540 09/23/16 0500  HGB 7.1* 6.9* 7.9*  HCT 23.7* 23.1* 25.2*  WBC 2.1* 1.5* 1.2*  PLT 87* 83* 77*   Ct Abdomen Pelvis W Contrast  Result Date: 09/22/2016 CLINICAL DATA:  Leukocytosis, sepsis. History of prostate cancer. Evaluate for pelvic abscess. EXAM: CT ABDOMEN AND PELVIS WITH CONTRAST TECHNIQUE: Multidetector CT imaging of the  abdomen and pelvis was performed using the standard protocol following bolus administration of intravenous contrast. CONTRAST:  100 mL Isovue 300 IV COMPARISON:  Correlation with CT chest dated 09/20/2016 FINDINGS: Lower chest: Bilateral pulmonary nodules, including a dominant 1.9 cm right lower lobe nodule (series 6/ image 3), unchanged from recent CT. Small bilateral pleural effusions, right greater than left, mildly increased. Hepatobiliary: Scattered hepatic cysts, measuring up to 2.0 cm in the central right liver (series 2/ image 17). Gallbladder is unremarkable. No intrahepatic or extrahepatic ductal dilatation. Pancreas: Within normal limits. Spleen: Within normal limits. Adrenals/Urinary Tract: Adrenal glands are within normal limits. Right renal cysts, including a dominant 3.0 cm right renal sinus cyst (series 2/ image 31). Left kidney is within normal limits. No hydronephrosis. Bladder is within normal limits. Stomach/Bowel: Stomach is within normal limits. No evidence of bowel obstruction. Normal appendix (series 2/ image 51). Vascular/Lymphatic: Atherosclerotic calcifications of the abdominal aorta and branch vessels. No evidence of abdominal aortic aneurysm. No suspicious abdominopelvic lymphadenopathy. Status post pelvic lymphadenectomy. Reproductive: Status post prostatectomy. Other: No abdominopelvic ascites. No drainable fluid collection/abscess. Musculoskeletal: Mild degenerative changes of the visualized thoracolumbar spine. IMPRESSION: No acute findings in the abdomen/pelvis. Specifically, no evidence of drainable fluid collection/abscess. Status post prostatectomy with pelvic lymphadenectomy. No evidence of recurrent or metastatic disease. Multiple bilateral pulmonary nodules, measuring up to 1.9 cm in the right lower lobe, unchanged from recent CT although new from 09/12/2016. Infection such as septic emboli is considered the diagnosis of conclusion.  Neoplasm, metastatic disease, and  sarcoidosis are not considered likely. Small bilateral pleural effusions, mildly increased. Electronically Signed   By: Julian Hy M.D.   On: 09/22/2016 15:43    ASSESSMENT / PLAN: MSSA bacteremia in Neutropenic host Neutropenic fever  MDS Pancytopenia  Dyspnea  H/o volume overload H/o COPD but most recent PFTs with minimal airflow obstruction Multiple pulmonary nodules on CT chest Cough Mild volume overload with ankle edema 09/22/2016 Very small bilateral effusions on CT chest Pleuritic pain 10/27  Discussion  CT discussion from 09/21/2016 note. I feel that the findings in his lung represent septic emboli. Viral panel is negative. He does have some mild ankle edema and anemia today which contribute to his dyspnea.  The small effusion in his chest is too small for thoracentesis, but given pain overnight will repeat CXR today to ensure it has not grown.  Suspect transudate given significant edema on exam   Plan Sputum culture Lasix again today CXR 2 view prior to discharge Continue anti-staph therapy as directed by ID Will hold off on bronchoscopy Will follow  Case discussed with son (pulmonologist in [place]) in detail 10/25  Roselie Awkward, MD [place] [facility] Pager: [phone] Cell: [phone] After 3pm or if no response, call [phone]   09/23/2016, 12:54 PM

## 2016-09-24 ENCOUNTER — Inpatient Hospital Stay (HOSPITAL_COMMUNITY): Payer: Medicare Other

## 2016-09-24 LAB — CBC WITH DIFFERENTIAL/PLATELET
BASOS ABS: 0 10*3/uL (ref 0.0–0.1)
Basophils Relative: 0 %
EOS ABS: 0 10*3/uL (ref 0.0–0.7)
EOS PCT: 0 %
HCT: 24.6 % — ABNORMAL LOW (ref 39.0–52.0)
HEMOGLOBIN: 7.6 g/dL — AB (ref 13.0–17.0)
Lymphocytes Relative: 74 %
Lymphs Abs: 0.8 10*3/uL (ref 0.7–4.0)
MCH: 28.6 pg (ref 26.0–34.0)
MCHC: 30.9 g/dL (ref 30.0–36.0)
MCV: 92.5 fL (ref 78.0–100.0)
Monocytes Absolute: 0.1 10*3/uL (ref 0.1–1.0)
Monocytes Relative: 7 %
NEUTROS PCT: 19 %
Neutro Abs: 0.2 10*3/uL — ABNORMAL LOW (ref 1.7–7.7)
PLATELETS: 105 10*3/uL — AB (ref 150–400)
RBC: 2.66 MIL/uL — AB (ref 4.22–5.81)
RDW: 18.6 % — ABNORMAL HIGH (ref 11.5–15.5)
WBC: 1 10*3/uL — AB (ref 4.0–10.5)

## 2016-09-24 MED ORDER — FUROSEMIDE 10 MG/ML IJ SOLN
40.0000 mg | Freq: Once | INTRAMUSCULAR | Status: AC
  Administered 2016-09-24: 40 mg via INTRAVENOUS
  Filled 2016-09-24: qty 4

## 2016-09-24 MED ORDER — FUROSEMIDE 20 MG PO TABS
20.0000 mg | ORAL_TABLET | Freq: Every day | ORAL | Status: DC
  Administered 2016-09-25 – 2016-09-26 (×2): 20 mg via ORAL
  Filled 2016-09-24 (×2): qty 1

## 2016-09-24 NOTE — Progress Notes (Signed)
[facility] PROGRESS NOTE  [name] CHE:[id number] DOB: [birth year] DOA: 09/14/2016  PCP: Wenda Low, MD  Brief History/Interval Summary: Warren [name] is a 75 y.o. male with MDS who was admitted recently for shortness of breath and respiratory failure with fluid overload suspected to be secondary to chemotherapy. Patient was discharged home on 10/18 and then subsequently developed fever and chills at home and so he came back to the hospital. Patient found to have neutropenia. He was hospitalized for further management. Blood Cultures returned positive for staph aureus. Patient to undergo bone marrow biopsy and TEE 10/24.  Reason for Visit: Neutropenic fever  Consultants: Oncology. Infectious disease. Interventional radiology. Pulmonology  Procedures: Bone marrow biopsy and TEE is scheduled for 10/24  Antibiotics: Vancomycin, cefepime , stopped on 10/20 Tamiflu initiated 10/19. Stopped on 10/20. Cefazolin 10/20  Subjective/Interval History: Patient is worried about his feet swelling. States he has hard time putting on his shoes.   ROS: Denies any nausea, vomiting, abdominal pain, diarrhea.   Objective:  Vital Signs  Vitals:   09/23/16 1400 09/23/16 2110 09/24/16 0500 09/24/16 1348  BP: (!) 149/73 (!) 151/85 (!) 154/83 139/71  Pulse: 70 72 71 75  Resp: '17 16 15 15  '$ Temp: 97.8 F (36.6 C) 98.4 F (36.9 C) 98.4 F (36.9 C) 98.5 F (36.9 C)  TempSrc: Oral Oral Oral Axillary  SpO2: 100% 96% 96% 99%  Weight:      Height:        Intake/Output Summary (Last 24 hours) at 09/24/16 1639 Last data filed at 09/24/16 1348  Gross per 24 hour  Intake              660 ml  Output                0 ml  Net              660 ml   Filed Weights   09/14/16 2254 09/15/16 0230  Weight: 76.2 kg (168 lb) 73.9 kg (163 lb)    General appearance: Awake and alert, in no acute distress Resp: No wheezing heard today. Good air entry bilaterally. No rhonchi. Normal effort.     Cardio: regular rate and rhythm, S1, S2 normal, no murmur, click, rub or gallop, + BL lower extremity edema GI: soft, non-tender; bowel sounds normal; no masses,  no organomegaly Extremities: Minimal edema lower extremities. Neurologic: Awake and alert. Oriented 3. Cranial nerves II-12 intact. Motor strength is equal bilateral upper and lower extremities.  Lab Results:  Data Reviewed: I have personally reviewed following labs and imaging studies  CBC:  Recent Labs Lab 09/20/16 0438 09/21/16 0430 09/22/16 0540 09/23/16 0500 09/24/16 0500  WBC 2.2* 2.1* 1.5* 1.2* 1.0*  NEUTROABS 1.1* 0.9* 0.6* 0.4* 0.2*  HGB 8.0* 7.1* 6.9* 7.9* 7.6*  HCT 26.7* 23.7* 23.1* 25.2* 24.6*  MCV 94.0 95.2 94.3 92.3 92.5  PLT 87* 87* 83* 77* 105*    Basic Metabolic Panel:  Recent Labs Lab 09/18/16 0446 09/20/16 0438 09/21/16 0430  NA 137 136 138  K 3.2* 3.9 3.6  CL 102 104 103  CO2 '25 25 26  '$ GLUCOSE 119* 119* 114*  BUN '16 19 19  '$ CREATININE 0.99 1.09 0.94  CALCIUM 7.9* 7.9* 7.8*    GFR: Estimated Creatinine Clearance: 61.5 mL/min (by C-G formula based on SCr of 0.94 mg/dL).  Liver Function Tests:  Recent Labs Lab 09/18/16 0446 09/23/16 1900  AST 59*  --   ALT 32  --  ALKPHOS 53  --   BILITOT 1.6*  --   PROT 6.0* 5.8*  ALBUMIN 3.1*  --     Cardiac Enzymes: No results for input(s): CKTOTAL, CKMB, CKMBINDEX, TROPONINI in the last 168 hours.    Results for orders placed or performed during the hospital encounter of 09/14/16  Blood Culture (routine x 2)     Status: Abnormal   Collection Time: 09/14/16 11:42 PM  Result Value Ref Range Status   Specimen Description BLOOD RIGHT ANTECUBITAL  Final   Special Requests BOTTLES DRAWN AEROBIC AND ANAEROBIC EA  Final   Culture  Setup Time   Final    IN BOTH AEROBIC AND ANAEROBIC BOTTLES GRAM POSITIVE COCCI CRITICAL RESULT CALLED TO, READ BACK BY AND VERIFIED WITH: T. GREEN, PHARMD (WL) AT 2149 ON 09/15/16 BY C. JESSUP,  MLT. Performed at [facility]    Culture STAPHYLOCOCCUS AUREUS (A)  Final   Report Status 09/17/2016 FINAL  Final   Organism ID, Bacteria STAPHYLOCOCCUS AUREUS  Final      Susceptibility   Staphylococcus aureus - MIC*    CIPROFLOXACIN >=8 RESISTANT Resistant     ERYTHROMYCIN >=8 RESISTANT Resistant     GENTAMICIN <=0.5 SENSITIVE Sensitive     OXACILLIN 0.5 SENSITIVE Sensitive     TETRACYCLINE <=1 SENSITIVE Sensitive     VANCOMYCIN 1 SENSITIVE Sensitive     TRIMETH/SULFA <=10 SENSITIVE Sensitive     CLINDAMYCIN <=0.25 SENSITIVE Sensitive     RIFAMPIN <=0.5 SENSITIVE Sensitive     Inducible Clindamycin NEGATIVE Sensitive     * STAPHYLOCOCCUS AUREUS  Blood Culture ID Panel (Reflexed)     Status: Abnormal   Collection Time: 09/14/16 11:42 PM  Result Value Ref Range Status   Enterococcus species NOT DETECTED NOT DETECTED Final   Listeria monocytogenes NOT DETECTED NOT DETECTED Final   Staphylococcus species DETECTED (A) NOT DETECTED Final    Comment: CRITICAL RESULT CALLED TO, READ BACK BY AND VERIFIED WITH: T. GREEN, PHARMD (WL) AT 2149 ON 09/15/16 BY C. JESSUP, MLT.    Staphylococcus aureus DETECTED (A) NOT DETECTED Final    Comment: CRITICAL RESULT CALLED TO, READ BACK BY AND VERIFIED WITH: T. GREEN, PHARMD (WL) AT 2149 ON 09/15/16 BY C. JESSUP, MLT.    Methicillin resistance NOT DETECTED NOT DETECTED Final   Streptococcus species NOT DETECTED NOT DETECTED Final   Streptococcus agalactiae NOT DETECTED NOT DETECTED Final   Streptococcus pneumoniae NOT DETECTED NOT DETECTED Final   Streptococcus pyogenes NOT DETECTED NOT DETECTED Final   Acinetobacter baumannii NOT DETECTED NOT DETECTED Final   Enterobacteriaceae species NOT DETECTED NOT DETECTED Final   Enterobacter cloacae complex NOT DETECTED NOT DETECTED Final   Escherichia coli NOT DETECTED NOT DETECTED Final   Klebsiella oxytoca NOT DETECTED NOT DETECTED Final   Klebsiella pneumoniae NOT DETECTED NOT DETECTED Final    Proteus species NOT DETECTED NOT DETECTED Final   Serratia marcescens NOT DETECTED NOT DETECTED Final   Haemophilus influenzae NOT DETECTED NOT DETECTED Final   Neisseria meningitidis NOT DETECTED NOT DETECTED Final   Pseudomonas aeruginosa NOT DETECTED NOT DETECTED Final   Candida albicans NOT DETECTED NOT DETECTED Final   Candida glabrata NOT DETECTED NOT DETECTED Final   Candida krusei NOT DETECTED NOT DETECTED Final   Candida parapsilosis NOT DETECTED NOT DETECTED Final   Candida tropicalis NOT DETECTED NOT DETECTED Final    Comment: Performed at [facility]  Blood Culture (routine x 2)     Status:  Abnormal   Collection Time: 09/14/16 11:58 PM  Result Value Ref Range Status   Specimen Description BLOOD BLOOD RIGHT HAND  Final   Special Requests IN PEDIATRIC BOTTLE 2ML  Final   Culture  Setup Time   Final    GRAM POSITIVE COCCI IN CLUSTERS AEROBIC BOTTLE ONLY CRITICAL VALUE NOTED.  VALUE IS CONSISTENT WITH PREVIOUSLY REPORTED AND CALLED VALUE.    Culture (A)  Final    STAPHYLOCOCCUS AUREUS SUSCEPTIBILITIES PERFORMED ON PREVIOUS CULTURE WITHIN THE LAST 5 DAYS. Performed at [facility]    Report Status 09/17/2016 FINAL  Final  Urine culture     Status: None   Collection Time: 09/15/16 12:01 AM  Result Value Ref Range Status   Specimen Description URINE, RANDOM  Final   Special Requests NONE  Final   Culture   Final    NO GROWTH 1 DAY Performed at [facility]    Report Status 09/16/2016 FINAL  Final  Culture, blood (routine x 2)     Status: None   Collection Time: 09/17/16  4:50 AM  Result Value Ref Range Status   Specimen Description BLOOD LEFT ARM  Final   Special Requests BOTTLES DRAWN AEROBIC AND ANAEROBIC 5CC EACH  Final   Culture   Final    NO GROWTH 5 DAYS Performed at [facility]    Report Status 09/22/2016 FINAL  Final  Culture, blood (routine x 2)     Status: None   Collection Time: 09/17/16  4:55 AM  Result Value Ref Range  Status   Specimen Description BLOOD LEFT HAND  Final   Special Requests BOTTLES DRAWN AEROBIC ONLY 5CC  Final   Culture   Final    NO GROWTH 5 DAYS Performed at [facility]    Report Status 09/22/2016 FINAL  Final  Respiratory Panel by PCR     Status: None   Collection Time: 09/21/16  3:41 PM  Result Value Ref Range Status   Adenovirus NOT DETECTED NOT DETECTED Final   Coronavirus 229E NOT DETECTED NOT DETECTED Final   Coronavirus HKU1 NOT DETECTED NOT DETECTED Final   Coronavirus NL63 NOT DETECTED NOT DETECTED Final   Coronavirus OC43 NOT DETECTED NOT DETECTED Final   Metapneumovirus NOT DETECTED NOT DETECTED Final   Rhinovirus / Enterovirus NOT DETECTED NOT DETECTED Final   Influenza A NOT DETECTED NOT DETECTED Final   Influenza B NOT DETECTED NOT DETECTED Final   Parainfluenza Virus 1 NOT DETECTED NOT DETECTED Final   Parainfluenza Virus 2 NOT DETECTED NOT DETECTED Final   Parainfluenza Virus 3 NOT DETECTED NOT DETECTED Final   Parainfluenza Virus 4 NOT DETECTED NOT DETECTED Final   Respiratory Syncytial Virus NOT DETECTED NOT DETECTED Final   Bordetella pertussis NOT DETECTED NOT DETECTED Final   Chlamydophila pneumoniae NOT DETECTED NOT DETECTED Final   Mycoplasma pneumoniae NOT DETECTED NOT DETECTED Final    Comment: Performed at [facility]  Body fluid culture     Status: None (Preliminary result)   Collection Time: 09/23/16  4:45 PM  Result Value Ref Range Status   Specimen Description PLEURAL  Final   Special Requests Immunocompromised  Final   Gram Stain   Final    MODERATE WBC PRESENT,BOTH PMN AND MONONUCLEAR NO ORGANISMS SEEN    Culture   Final    NO GROWTH < 24 HOURS Performed at [facility]    Report Status PENDING  Incomplete     Radiology Studies:  Dg Chest 2 View  Result Date: 09/23/2016 CLINICAL DATA:  Chest pain. EXAM: CHEST  2 VIEW COMPARISON:  09/17/2016 and chest CT 09/20/2016 FINDINGS: Interval placement of right-sided  PICC line with tip just below the cavoatrial junction. Lungs are adequately inflated demonstrate slight worsening of mixed interstitial airspace density over the lung bases with patchy perihilar opacification. Findings are likely due to infection. Small amount of bilateral pleural fluid. Mild stable cardiomegaly. Minimal calcified plaque over the aortic arch. Mild degenerate changes spine with mild stable anterior wedging of a mid thoracic vertebral body. IMPRESSION: Worsening bibasilar opacification and patchy perihilar opacification suggesting infection. Small amount of bilateral pleural fluid. Mild stable cardiomegaly. Right-sided PICC line with tip just below the region of the cavoatrial junction. Electronically Signed   By: Marin Olp M.D.   On: 09/23/2016 13:46   Dg Chest Port 1 View  Result Date: 09/24/2016 CLINICAL DATA:  Pleural effusion, CHF, COPD EXAM: PORTABLE CHEST 1 VIEW COMPARISON:  09/23/2016 FINDINGS: Right-sided PICC line with the tip projecting over the SVC. Trace bilateral pleural effusions. Bilateral diffuse interstitial thickening. No pneumothorax. Stable cardiomediastinal silhouette. No acute osseous abnormality. IMPRESSION: 1. Bilateral interstitial airspace disease consistent concerning for infectious etiology. Trace bilateral pleural effusions. Electronically Signed   By: Kathreen Devoid   On: 09/24/2016 09:01   Dg Chest Port 1 View  Result Date: 09/23/2016 CLINICAL DATA:  Status post thoracentesis EXAM: PORTABLE CHEST 1 VIEW COMPARISON:  Chest radiograph from earlier today. FINDINGS: Right PICC terminates at the cavoatrial junction. Stable cardiomediastinal silhouette with mild cardiomegaly and aortic atherosclerosis. No pneumothorax. Small left pleural effusion appears stable. No right pleural effusion. Extensive patchy reticular and nodular opacities throughout both lungs have not appreciably changed. IMPRESSION: 1. No pneumothorax. 2. Stable small left pleural effusion. 3. No  change in nonspecific extensive patchy reticular and nodular opacities throughout both lungs. Electronically Signed   By: Ilona Sorrel M.D.   On: 09/23/2016 17:04     Medications:  Scheduled: . sodium chloride  250 mL Intravenous Once  . acetaminophen  650 mg Oral Once  . calcium carbonate  1 tablet Oral QHS  .  ceFAZolin (ANCEF) IV  2 g Intravenous Q8H  . chlorhexidine  5 mL Mouth/Throat BID  . diphenhydrAMINE  25 mg Oral Once  . [START ON 09/25/2016] furosemide  20 mg Oral Daily  . irbesartan  37.5 mg Oral Daily  . multivitamin with minerals  1 tablet Oral Daily  . pantoprazole  40 mg Oral Daily  . polyethylene glycol  17 g Oral Daily  . posaconazole  300 mg Oral QHS  . 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 Infectious disease is following. Cultures have been repeated 10/21 which have not shown any growth so far. Patient is currently on Ancef. Vancomycin and cefepime were discontinued. Tamiflu was also  discontinued. Influenza PCR was negative. Source of this infection is not entirely clear, although patient did have cellulitis involving his left ankle area recently. MRI of the ankle did not show any obvious abscess. No evidence for osteomyelitis.  Nonspecific tendon changes were noted. Patient recalls ankle injury remotely. No recent injury. patient is status post TEE. Continue patient's home dose of posaconazole and valacyclovir. PICC line inserted.  Antibiotic plan outlined by infectious disease specialist, CT scan of abdomen and pelvis negative  BL lower extremity edema - Will add compression stockings and add lasix. - recommended patient elevate legs as well.  Myelodysplastic syndrome with pancytopenia Being followed by Dr. Irene Limbo and also at [facility] for possible stem cell transplant. Patient underwent bone marrow biopsy earlier today. Continue to monitor CBC. Neutrophil counts appear to have improved. He did receive 1 unit of blood ordered by his hematologist. Platelet counts noted to be low but stable. No evidence for bleeding. Continue to hold Lovenox.  Recently admitted for fluid overload Most likely from chemotherapy related. Echocardiogram showed preserved LV function with no diastolic dysfunction. He appears to be quite euvolemic at this time. BNP is normal. Continue to hold Lasix.  Dyspnea in the setting of known COPD Pulmonary on board  Hypokalemia Repleted. And wnl on last check  DVT Prophylaxis: Changed Lovenox to SCDs due to low platelets. Code Status: Full code  Family Communication: Discussed with the patient and his wife. Disposition Plan: pending final recommendations from specialist involved.    LOS: 9 days   Fantasy Donald, [facility] Pager [phone] 09/24/2016, 4:39 PM  If 7PM-7AM, please contact night-coverage at [email or web address], password [facility]

## 2016-09-24 NOTE — Progress Notes (Addendum)
Name: Warren [name] MRN: [id number] DOB: [birth year]    ADMISSION DATE:  09/14/2016 CONSULTATION DATE: 10/24  REFERRING MD :  [place] Pink   CHIEF COMPLAINT:  Dyspnea, immunocompromised. ? bronch  BRIEF PATIENT DESCRIPTION:  75 year old male w/ MDS, admitted w/ neutropenia MSSA bacteremia. PCCM asked to see on 10/24  Due to on-going fever and thoughts on bronchoscopy in this setting.   SIGNIFICANT EVENTS  Bone narrow biopsy 10/24>>> Rt Thoracentesis 10/27  STUDIES:  MR ankle: negative for osteo.  TEE 10/24>>> negative for vegetation CT chest 10/24 > multiple nodules in a bronchovascular distribution  Pleural studies 10/27 LDH 236 (serum 287), Protein <3 (serum 5.8) Micro NGTD Cell count WBC 699, 33% L, 4% N, 63% M Cytology pending  BRIEF: 75 y/o male with months long neutropenia related to MDS and chemotherapy in with MSSA bacteremia.  PCCM consulted for evaluation of cough, ongoing fevers, dyspnea.  Subjective: States that the pleuritic chest pain and breathing are markedly better after thoracentesis. Feels well today AM. No new compliants.  VITAL SIGNS: Temp:  [97.8 F (36.6 C)-98.4 F (36.9 C)] 98.4 F (36.9 C) (10/28 0500) Pulse Rate:  [70-72] 71 (10/28 0500) Resp:  [15-17] 15 (10/28 0500) BP: (149-154)/(73-85) 154/83 (10/28 0500) SpO2:  [96 %-100 %] 96 % (10/28 0500) Room air  PHYSICAL EXAMINATION: General:  Resting comfortably in bed HENT: Moist mucus membranes, no thyromegaly, JVD PULM: Clear, No wheeze or crackles CV: RRR, no MRG GI: BS+, soft, nontender MSK: normal bulk and tone, significant ankle edema today   Recent Labs Lab 09/18/16 0446 09/20/16 0438 09/21/16 0430  NA 137 136 138  K 3.2* 3.9 3.6  CL 102 104 103  CO2 25 25 26   BUN 16 19 19   CREATININE 0.99 1.09 0.94  GLUCOSE 119* 119* 114*    Recent Labs Lab 09/22/16 0540 09/23/16 0500 09/24/16 0500  HGB 6.9* 7.9* 7.6*  HCT 23.1* 25.2* 24.6*  WBC 1.5* 1.2* 1.0*  PLT 83* 77* 105*    Dg Chest 2 View  Result Date: 09/23/2016 CLINICAL DATA:  Chest pain. EXAM: CHEST  2 VIEW COMPARISON:  09/17/2016 and chest CT 09/20/2016 FINDINGS: Interval placement of right-sided PICC line with tip just below the cavoatrial junction. Lungs are adequately inflated demonstrate slight worsening of mixed interstitial airspace density over the lung bases with patchy perihilar opacification. Findings are likely due to infection. Small amount of bilateral pleural fluid. Mild stable cardiomegaly. Minimal calcified plaque over the aortic arch. Mild degenerate changes spine with mild stable anterior wedging of a mid thoracic vertebral body. IMPRESSION: Worsening bibasilar opacification and patchy perihilar opacification suggesting infection. Small amount of bilateral pleural fluid. Mild stable cardiomegaly. Right-sided PICC line with tip just below the region of the cavoatrial junction. Electronically Signed   By: Marin Olp M.D.   On: 09/23/2016 13:46   Ct Abdomen Pelvis W Contrast  Result Date: 09/22/2016 CLINICAL DATA:  Leukocytosis, sepsis. History of prostate cancer. Evaluate for pelvic abscess. EXAM: CT ABDOMEN AND PELVIS WITH CONTRAST TECHNIQUE: Multidetector CT imaging of the abdomen and pelvis was performed using the standard protocol following bolus administration of intravenous contrast. CONTRAST:  100 mL Isovue 300 IV COMPARISON:  Correlation with CT chest dated 09/20/2016 FINDINGS: Lower chest: Bilateral pulmonary nodules, including a dominant 1.9 cm right lower lobe nodule (series 6/ image 3), unchanged from recent CT. Small bilateral pleural effusions, right greater than left, mildly increased. Hepatobiliary: Scattered hepatic cysts, measuring up to 2.0 cm in the  central right liver (series 2/ image 17). Gallbladder is unremarkable. No intrahepatic or extrahepatic ductal dilatation. Pancreas: Within normal limits. Spleen: Within normal limits. Adrenals/Urinary Tract: Adrenal glands are within  normal limits. Right renal cysts, including a dominant 3.0 cm right renal sinus cyst (series 2/ image 31). Left kidney is within normal limits. No hydronephrosis. Bladder is within normal limits. Stomach/Bowel: Stomach is within normal limits. No evidence of bowel obstruction. Normal appendix (series 2/ image 51). Vascular/Lymphatic: Atherosclerotic calcifications of the abdominal aorta and branch vessels. No evidence of abdominal aortic aneurysm. No suspicious abdominopelvic lymphadenopathy. Status post pelvic lymphadenectomy. Reproductive: Status post prostatectomy. Other: No abdominopelvic ascites. No drainable fluid collection/abscess. Musculoskeletal: Mild degenerative changes of the visualized thoracolumbar spine. IMPRESSION: No acute findings in the abdomen/pelvis. Specifically, no evidence of drainable fluid collection/abscess. Status post prostatectomy with pelvic lymphadenectomy. No evidence of recurrent or metastatic disease. Multiple bilateral pulmonary nodules, measuring up to 1.9 cm in the right lower lobe, unchanged from recent CT although new from 09/12/2016. Infection such as septic emboli is considered the diagnosis of conclusion. Neoplasm, metastatic disease, and sarcoidosis are not considered likely. Small bilateral pleural effusions, mildly increased. Electronically Signed   By: Julian Hy M.D.   On: 09/22/2016 15:43   Dg Chest Port 1 View  Result Date: 09/24/2016 CLINICAL DATA:  Pleural effusion, CHF, COPD EXAM: PORTABLE CHEST 1 VIEW COMPARISON:  09/23/2016 FINDINGS: Right-sided PICC line with the tip projecting over the SVC. Trace bilateral pleural effusions. Bilateral diffuse interstitial thickening. No pneumothorax. Stable cardiomediastinal silhouette. No acute osseous abnormality. IMPRESSION: 1. Bilateral interstitial airspace disease consistent concerning for infectious etiology. Trace bilateral pleural effusions. Electronically Signed   By: Kathreen Devoid   On: 09/24/2016 09:01     Dg Chest Port 1 View  Result Date: 09/23/2016 CLINICAL DATA:  Status post thoracentesis EXAM: PORTABLE CHEST 1 VIEW COMPARISON:  Chest radiograph from earlier today. FINDINGS: Right PICC terminates at the cavoatrial junction. Stable cardiomediastinal silhouette with mild cardiomegaly and aortic atherosclerosis. No pneumothorax. Small left pleural effusion appears stable. No right pleural effusion. Extensive patchy reticular and nodular opacities throughout both lungs have not appreciably changed. IMPRESSION: 1. No pneumothorax. 2. Stable small left pleural effusion. 3. No change in nonspecific extensive patchy reticular and nodular opacities throughout both lungs. Electronically Signed   By: Ilona Sorrel M.D.   On: 09/23/2016 17:04    ASSESSMENT / PLAN: MSSA bacteremia in Neutropenic host Neutropenic fever  MDS Pancytopenia  Dyspnea  H/o volume overload H/o COPD but most recent PFTs with minimal airflow obstruction Multiple pulmonary nodules on CT chest Cough Mild volume overload with ankle edema 09/22/2016 Very small bilateral effusions on CT chest Pleuritic pain 10/27  Discussion  CT scan and lung imaging reviewed. The findings in his lung likely represent septic emboli. Viral panel is negative.  S/p thoracentesis 10/27 with improvement in symptoms. Fluid is exudative. Cultures and cytology are pending  Plan Continue anti-staph therapy as directed by ID Will hold off on bronchoscopy  We will check back on Monday after the results of the final cultures and cytology are back  Case discussed with son (pulmonologist in [place]) by Dr. Lake Bells in detail 10/25  Marshell Garfinkel MD [facility] Pager [phone] If no answer or after 3pm call: [phone] 09/24/2016, 9:36 AM

## 2016-09-24 NOTE — Progress Notes (Signed)
Pt has declined use of CPAP during his stay at this facility.  RT to monitor and assess as needed.

## 2016-09-25 LAB — BASIC METABOLIC PANEL
ANION GAP: 7 (ref 5–15)
BUN: 15 mg/dL (ref 6–20)
CHLORIDE: 104 mmol/L (ref 101–111)
CO2: 30 mmol/L (ref 22–32)
Calcium: 7.8 mg/dL — ABNORMAL LOW (ref 8.9–10.3)
Creatinine, Ser: 1.04 mg/dL (ref 0.61–1.24)
GFR calc non Af Amer: >60 mL/min (ref >60)
Glucose, Bld: 106 mg/dL — ABNORMAL HIGH (ref 65–99)
Potassium: 3 mmol/L — ABNORMAL LOW (ref 3.5–5.1)
Sodium: 141 mmol/L (ref 135–145)

## 2016-09-25 LAB — CBC WITH DIFFERENTIAL/PLATELET
BASOS PCT: 0 %
Basophils Absolute: 0 10*3/uL (ref 0.0–0.1)
EOS ABS: 0 10*3/uL (ref 0.0–0.7)
EOS PCT: 0 %
HCT: 24.7 % — ABNORMAL LOW (ref 39.0–52.0)
HEMOGLOBIN: 7.7 g/dL — AB (ref 13.0–17.0)
LYMPHS PCT: 78 %
Lymphs Abs: 0.9 10*3/uL (ref 0.7–4.0)
MCH: 28.9 pg (ref 26.0–34.0)
MCHC: 31.2 g/dL (ref 30.0–36.0)
MCV: 92.9 fL (ref 78.0–100.0)
Monocytes Absolute: 0 10*3/uL — ABNORMAL LOW (ref 0.1–1.0)
Monocytes Relative: 4 %
NEUTROS PCT: 18 %
Neutro Abs: 0.2 10*3/uL — ABNORMAL LOW (ref 1.7–7.7)
Platelets: 149 10*3/uL — ABNORMAL LOW (ref 150–400)
RBC: 2.66 MIL/uL — ABNORMAL LOW (ref 4.22–5.81)
RDW: 18.2 % — ABNORMAL HIGH (ref 11.5–15.5)
WBC: 1.1 10*3/uL — CL (ref 4.0–10.5)

## 2016-09-25 LAB — PH, BODY FLUID: pH, Body Fluid: 7.5

## 2016-09-25 MED ORDER — POTASSIUM CHLORIDE CRYS ER 20 MEQ PO TBCR
40.0000 meq | EXTENDED_RELEASE_TABLET | Freq: Once | ORAL | Status: AC
  Administered 2016-09-25: 40 meq via ORAL
  Filled 2016-09-25: qty 2

## 2016-09-25 NOTE — Progress Notes (Signed)
[facility] PROGRESS NOTE  [name] OIN:[id number] DOB: Apr 19, 1941 DOA: 09/14/2016  PCP: Wenda Low, MD  Brief History/Interval Summary: Warren [name] is a 75 y.o. male with MDS who was admitted recently for shortness of breath and respiratory failure with fluid overload suspected to be secondary to chemotherapy. Patient was discharged home on 10/18 and then subsequently developed fever and chills at home and so he came back to the hospital. Patient found to have neutropenia. He was hospitalized for further management. Blood Cultures returned positive for staph aureus. Patient to undergo bone marrow biopsy and TEE 10/24.  Reason for Visit: Neutropenic fever  Consultants: Oncology. Infectious disease. Interventional radiology. Pulmonology  Procedures: Bone marrow biopsy and TEE is scheduled for 10/24  Antibiotics: Vancomycin, cefepime , stopped on 10/20 Tamiflu initiated 10/19. Stopped on 10/20. Cefazolin 10/20  Subjective/Interval History: Patient is worried about his feet swelling. States he has hard time putting on his shoes.   ROS: Denies any nausea, vomiting, abdominal pain, diarrhea.   Objective:  Vital Signs  Vitals:   09/24/16 0500 09/24/16 1348 09/24/16 2229 09/25/16 0637  BP: (!) 154/83 139/71 (!) 152/67 (!) 149/83  Pulse: 71 75 67 77  Resp: '15 15 16 16  '$ Temp: 98.4 F (36.9 C) 98.5 F (36.9 C) 98.4 F (36.9 C) 98.6 F (37 C)  TempSrc: Oral Axillary Oral Oral  SpO2: 96% 99% 99% 95%  Weight:      Height:        Intake/Output Summary (Last 24 hours) at 09/25/16 1335 Last data filed at 09/25/16 7096  Gross per 24 hour  Intake             1760 ml  Output                2 ml  Net             1758 ml   Filed Weights   09/14/16 2254 09/15/16 0230  Weight: 76.2 kg (168 lb) 73.9 kg (163 lb)    General appearance: Awake and alert, in no acute distress Resp: No wheezing heard today. Good air entry bilaterally. No rhonchi. Normal effort.     Cardio: regular rate and rhythm, S1, S2 normal, no murmur, click, rub or gallop, + BL lower extremity edema GI: soft, non-tender; bowel sounds normal; no masses,  no organomegaly Extremities: Minimal edema lower extremities. Neurologic: Awake and alert. Oriented 3. Cranial nerves III-12 intact. Motor strength is equal bilateral upper and lower extremities.  Lab Results:  Data Reviewed: I have personally reviewed following labs and imaging studies  CBC:  Recent Labs Lab 09/21/16 0430 09/22/16 0540 09/23/16 0500 09/24/16 0500 09/25/16 0342  WBC 2.1* 1.5* 1.2* 1.0* 1.1*  NEUTROABS 0.9* 0.6* 0.4* 0.2* 0.2*  HGB 7.1* 6.9* 7.9* 7.6* 7.7*  HCT 23.7* 23.1* 25.2* 24.6* 24.7*  MCV 95.2 94.3 92.3 92.5 92.9  PLT 87* 83* 77* 105* 149*    Basic Metabolic Panel:  Recent Labs Lab 09/20/16 0438 09/21/16 0430 09/25/16 0343  NA 136 138 141  K 3.9 3.6 3.0*  CL 104 103 104  CO2 '25 26 30  '$ GLUCOSE 119* 114* 106*  BUN '19 19 15  '$ CREATININE 1.09 0.94 1.04  CALCIUM 7.9* 7.8* 7.8*    GFR: Estimated Creatinine Clearance: 55.6 mL/min (by C-G formula based on SCr of 1.04 mg/dL).  Liver Function Tests:  Recent Labs Lab 09/23/16 1900  PROT 5.8*    Cardiac Enzymes: No results for input(s): CKTOTAL, CKMB,  CKMBINDEX, TROPONINI in the last 168 hours.    Results for orders placed or performed during the hospital encounter of 09/14/16  Blood Culture (routine x 2)     Status: Abnormal   Collection Time: 09/14/16 11:42 PM  Result Value Ref Range Status   Specimen Description BLOOD RIGHT ANTECUBITAL  Final   Special Requests BOTTLES DRAWN AEROBIC AND ANAEROBIC 5ML EA  Final   Culture  Setup Time   Final    IN BOTH AEROBIC AND ANAEROBIC BOTTLES GRAM POSITIVE COCCI CRITICAL RESULT CALLED TO, READ BACK BY AND VERIFIED WITH: T. GREEN, PHARMD ([place]) AT 2149 ON 09/15/16 BY C. JESSUP, MLT. Performed at [facility] (A)  Final   Report Status 09/17/2016  FINAL  Final   Organism ID, Bacteria STAPHYLOCOCCUS AUREUS  Final      Susceptibility   Staphylococcus aureus - MIC*    CIPROFLOXACIN >=8 RESISTANT Resistant     ERYTHROMYCIN >=8 RESISTANT Resistant     GENTAMICIN <=0.5 SENSITIVE Sensitive     OXACILLIN 0.5 SENSITIVE Sensitive     TETRACYCLINE <=1 SENSITIVE Sensitive     VANCOMYCIN 1 SENSITIVE Sensitive     TRIMETH/SULFA <=10 SENSITIVE Sensitive     CLINDAMYCIN <=0.25 SENSITIVE Sensitive     RIFAMPIN <=0.5 SENSITIVE Sensitive     Inducible Clindamycin NEGATIVE Sensitive     * STAPHYLOCOCCUS AUREUS  Blood Culture ID Panel (Reflexed)     Status: Abnormal   Collection Time: 09/14/16 11:42 PM  Result Value Ref Range Status   Enterococcus species NOT DETECTED NOT DETECTED Final   Listeria monocytogenes NOT DETECTED NOT DETECTED Final   Staphylococcus species DETECTED (A) NOT DETECTED Final    Comment: CRITICAL RESULT CALLED TO, READ BACK BY AND VERIFIED WITH: T. GREEN, PHARMD ([facility]) AT 2149 ON 09/15/16 BY C. JESSUP, MLT.    Staphylococcus aureus DETECTED (A) NOT DETECTED Final    Comment: CRITICAL RESULT CALLED TO, READ BACK BY AND VERIFIED WITH: T. GREEN, PHARMD ([facility]) AT 2149 ON 09/15/16 BY C. JESSUP, MLT.    Methicillin resistance NOT DETECTED NOT DETECTED Final   Streptococcus species NOT DETECTED NOT DETECTED Final   Streptococcus agalactiae NOT DETECTED NOT DETECTED Final   Streptococcus pneumoniae NOT DETECTED NOT DETECTED Final   Streptococcus pyogenes NOT DETECTED NOT DETECTED Final   Acinetobacter baumannii NOT DETECTED NOT DETECTED Final   Enterobacteriaceae species NOT DETECTED NOT DETECTED Final   Enterobacter cloacae complex NOT DETECTED NOT DETECTED Final   Escherichia coli NOT DETECTED NOT DETECTED Final   Klebsiella oxytoca NOT DETECTED NOT DETECTED Final   Klebsiella pneumoniae NOT DETECTED NOT DETECTED Final   Proteus species NOT DETECTED NOT DETECTED Final   Serratia marcescens NOT DETECTED NOT DETECTED Final    Haemophilus influenzae NOT DETECTED NOT DETECTED Final   Neisseria meningitidis NOT DETECTED NOT DETECTED Final   Pseudomonas aeruginosa NOT DETECTED NOT DETECTED Final   Candida albicans NOT DETECTED NOT DETECTED Final   Candida glabrata NOT DETECTED NOT DETECTED Final   Candida krusei NOT DETECTED NOT DETECTED Final   Candida parapsilosis NOT DETECTED NOT DETECTED Final   Candida tropicalis NOT DETECTED NOT DETECTED Final    Comment: Performed at [facility]  Blood Culture (routine x 2)     Status: Abnormal   Collection Time: 09/14/16 11:58 PM  Result Value Ref Range Status   Specimen Description BLOOD BLOOD RIGHT HAND  Final   Special Requests IN PEDIATRIC BOTTLE 2ML  Final   Culture  Setup Time   Final    GRAM POSITIVE COCCI IN CLUSTERS AEROBIC BOTTLE ONLY CRITICAL VALUE NOTED.  VALUE IS CONSISTENT WITH PREVIOUSLY REPORTED AND CALLED VALUE.    Culture (A)  Final    STAPHYLOCOCCUS AUREUS SUSCEPTIBILITIES PERFORMED ON PREVIOUS CULTURE WITHIN THE LAST 5 DAYS. Performed at [facility]    Report Status 09/17/2016 FINAL  Final  Urine culture     Status: None   Collection Time: 09/15/16 12:01 AM  Result Value Ref Range Status   Specimen Description URINE, RANDOM  Final   Special Requests NONE  Final   Culture   Final    NO GROWTH 1 DAY Performed at [facility]    Report Status 09/16/2016 FINAL  Final  Culture, blood (routine x 2)     Status: None   Collection Time: 09/17/16  4:50 AM  Result Value Ref Range Status   Specimen Description BLOOD LEFT ARM  Final   Special Requests BOTTLES DRAWN AEROBIC AND ANAEROBIC 5CC EACH  Final   Culture   Final    NO GROWTH 5 DAYS Performed at [facility]    Report Status 09/22/2016 FINAL  Final  Culture, blood (routine x 2)     Status: None   Collection Time: 09/17/16  4:55 AM  Result Value Ref Range Status   Specimen Description BLOOD LEFT HAND  Final   Special Requests BOTTLES DRAWN AEROBIC ONLY 5CC   Final   Culture   Final    NO GROWTH 5 DAYS Performed at [facility]    Report Status 09/22/2016 FINAL  Final  Respiratory Panel by PCR     Status: None   Collection Time: 09/21/16  3:41 PM  Result Value Ref Range Status   Adenovirus NOT DETECTED NOT DETECTED Final   Coronavirus 229E NOT DETECTED NOT DETECTED Final   Coronavirus HKU1 NOT DETECTED NOT DETECTED Final   Coronavirus NL63 NOT DETECTED NOT DETECTED Final   Coronavirus OC43 NOT DETECTED NOT DETECTED Final   Metapneumovirus NOT DETECTED NOT DETECTED Final   Rhinovirus / Enterovirus NOT DETECTED NOT DETECTED Final   Influenza A NOT DETECTED NOT DETECTED Final   Influenza B NOT DETECTED NOT DETECTED Final   Parainfluenza Virus 1 NOT DETECTED NOT DETECTED Final   Parainfluenza Virus 2 NOT DETECTED NOT DETECTED Final   Parainfluenza Virus 3 NOT DETECTED NOT DETECTED Final   Parainfluenza Virus 4 NOT DETECTED NOT DETECTED Final   Respiratory Syncytial Virus NOT DETECTED NOT DETECTED Final   Bordetella pertussis NOT DETECTED NOT DETECTED Final   Chlamydophila pneumoniae NOT DETECTED NOT DETECTED Final   Mycoplasma pneumoniae NOT DETECTED NOT DETECTED Final    Comment: Performed at [facility]  Body fluid culture     Status: None (Preliminary result)   Collection Time: 09/23/16  4:45 PM  Result Value Ref Range Status   Specimen Description PLEURAL  Final   Special Requests Immunocompromised  Final   Gram Stain   Final    MODERATE WBC PRESENT,BOTH PMN AND MONONUCLEAR NO ORGANISMS SEEN    Culture   Final    NO GROWTH 2 DAYS Performed at [facility]    Report Status PENDING  Incomplete     Radiology Studies: Dg Chest 2 View  Result Date: 09/23/2016 CLINICAL DATA:  Chest pain. EXAM: CHEST  2 VIEW COMPARISON:  09/17/2016 and chest CT 09/20/2016 FINDINGS: Interval placement of right-sided PICC line with tip  just below the cavoatrial junction. Lungs are adequately inflated demonstrate slight  worsening of mixed interstitial airspace density over the lung bases with patchy perihilar opacification. Findings are likely due to infection. Small amount of bilateral pleural fluid. Mild stable cardiomegaly. Minimal calcified plaque over the aortic arch. Mild degenerate changes spine with mild stable anterior wedging of a mid thoracic vertebral body. IMPRESSION: Worsening bibasilar opacification and patchy perihilar opacification suggesting infection. Small amount of bilateral pleural fluid. Mild stable cardiomegaly. Right-sided PICC line with tip just below the region of the cavoatrial junction. Electronically Signed   By: Marin Olp M.D.   On: 09/23/2016 13:46   Dg Chest Port 1 View  Result Date: 09/24/2016 CLINICAL DATA:  Pleural effusion, CHF, COPD EXAM: PORTABLE CHEST 1 VIEW COMPARISON:  09/23/2016 FINDINGS: Right-sided PICC line with the tip projecting over the SVC. Trace bilateral pleural effusions. Bilateral diffuse interstitial thickening. No pneumothorax. Stable cardiomediastinal silhouette. No acute osseous abnormality. IMPRESSION: 1. Bilateral interstitial airspace disease consistent concerning for infectious etiology. Trace bilateral pleural effusions. Electronically Signed   By: Kathreen Devoid   On: 09/24/2016 09:01   Dg Chest Port 1 View  Result Date: 09/23/2016 CLINICAL DATA:  Status post thoracentesis EXAM: PORTABLE CHEST 1 VIEW COMPARISON:  Chest radiograph from earlier today. FINDINGS: Right PICC terminates at the cavoatrial junction. Stable cardiomediastinal silhouette with mild cardiomegaly and aortic atherosclerosis. No pneumothorax. Small left pleural effusion appears stable. No right pleural effusion. Extensive patchy reticular and nodular opacities throughout both lungs have not appreciably changed. IMPRESSION: 1. No pneumothorax. 2. Stable small left pleural effusion. 3. No change in nonspecific extensive patchy reticular and nodular opacities throughout both lungs.  Electronically Signed   By: Ilona Sorrel M.D.   On: 09/23/2016 17:04     Medications:  Scheduled: . sodium chloride  250 mL Intravenous Once  . acetaminophen  650 mg Oral Once  . calcium carbonate  1 tablet Oral QHS  .  ceFAZolin (ANCEF) IV  2 g Intravenous Q8H  . chlorhexidine  5 mL Mouth/Throat BID  . diphenhydrAMINE  25 mg Oral Once  . furosemide  20 mg Oral Daily  . irbesartan  37.5 mg Oral Daily  . multivitamin with minerals  1 tablet Oral Daily  . pantoprazole  40 mg Oral Daily  . polyethylene glycol  17 g Oral Daily  . posaconazole  300 mg Oral QHS  . 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 Infectious disease is following. Cultures have been repeated 10/21 which have not shown any growth so far. Patient is currently on Ancef. Vancomycin and cefepime were discontinued. Tamiflu was also discontinued. Influenza PCR was negative. Source of this infection is not entirely clear, although patient did have cellulitis involving his left ankle area recently. MRI of the ankle did not show any obvious abscess. No evidence for  osteomyelitis. Nonspecific tendon changes were noted. Patient recalls ankle injury remotely. No recent injury. patient is status post  TEE. Continue patient's home dose of posaconazole and valacyclovir. PICC line inserted.  Antibiotic plan outlined by infectious disease specialist, CT scan of abdomen and pelvis negative  BL lower extremity edema - much improved with compression stockings and lasix. - pt to continue to elevate legs as well.  Myelodysplastic syndrome with pancytopenia Being followed by Dr. Irene Limbo and also at [facility] for possible stem cell transplant. Patient underwent bone marrow biopsy earlier today. Continue to monitor CBC. Neutrophil counts appear to have improved. He did receive 1 unit of blood ordered by his hematologist. Platelet counts noted to be low but stable. No evidence for bleeding. Continue to hold Lovenox. - Pt doesn't want transfusions until hemoglobin is at or below 7.0  Recently admitted for fluid overload Most likely from chemotherapy related. Echocardiogram showed preserved LV function with no diastolic dysfunction. He appears to be quite euvolemic at this time. BNP is normal. Continue to hold Lasix.  Dyspnea in the setting of known COPD Pulmonary on board and patient is s/p thoracentesis with pleural fluid analysis. Currently waiting on results of serology.  Hypokalemia Replete. Most likely due to the lasix.   DVT Prophylaxis: Changed Lovenox to SCDs due to low platelets. Code Status: Full code  Family Communication: Discussed with the patient and his wife. Disposition Plan: pending final recommendations from specialist involved. (awaiting serology of pleural fluid collected by pulmonologist)    LOS: 10 days   Velvet Bathe  [facility] Pager [phone] 09/25/2016, 1:35 PM  If 7PM-7AM, please contact night-coverage at [email or web address], password [facility]

## 2016-09-25 NOTE — Progress Notes (Signed)
Pt refuses cpap.  RN notified.  RT to monitor and assess as needed.

## 2016-09-26 ENCOUNTER — Inpatient Hospital Stay (HOSPITAL_COMMUNITY): Payer: Medicare Other

## 2016-09-26 DIAGNOSIS — I110 Hypertensive heart disease with heart failure: Secondary | ICD-10-CM | POA: Diagnosis not present

## 2016-09-26 DIAGNOSIS — I509 Heart failure, unspecified: Secondary | ICD-10-CM | POA: Diagnosis not present

## 2016-09-26 DIAGNOSIS — A4901 Methicillin susceptible Staphylococcus aureus infection, unspecified site: Secondary | ICD-10-CM | POA: Diagnosis not present

## 2016-09-26 DIAGNOSIS — D571 Sickle-cell disease without crisis: Secondary | ICD-10-CM | POA: Diagnosis not present

## 2016-09-26 DIAGNOSIS — D696 Thrombocytopenia, unspecified: Secondary | ICD-10-CM | POA: Diagnosis not present

## 2016-09-26 DIAGNOSIS — M48061 Spinal stenosis, lumbar region without neurogenic claudication: Secondary | ICD-10-CM | POA: Diagnosis not present

## 2016-09-26 LAB — CBC WITH DIFFERENTIAL/PLATELET
BASOS PCT: 0 %
Basophils Absolute: 0 10*3/uL (ref 0.0–0.1)
EOS PCT: 1 %
Eosinophils Absolute: 0 10*3/uL (ref 0.0–0.7)
HCT: 25.4 % — ABNORMAL LOW (ref 39.0–52.0)
Hemoglobin: 7.7 g/dL — ABNORMAL LOW (ref 13.0–17.0)
LYMPHS PCT: 81 %
Lymphs Abs: 0.7 10*3/uL (ref 0.7–4.0)
MCH: 28.3 pg (ref 26.0–34.0)
MCHC: 30.3 g/dL (ref 30.0–36.0)
MCV: 93.4 fL (ref 78.0–100.0)
Monocytes Absolute: 0.1 10*3/uL (ref 0.1–1.0)
Monocytes Relative: 6 %
NEUTROS PCT: 12 %
Neutro Abs: 0.1 10*3/uL — ABNORMAL LOW (ref 1.7–7.7)
Platelets: 192 10*3/uL (ref 150–400)
RBC: 2.72 MIL/uL — ABNORMAL LOW (ref 4.22–5.81)
RDW: 18.9 % — ABNORMAL HIGH (ref 11.5–15.5)
WBC: 0.9 10*3/uL — CL (ref 4.0–10.5)

## 2016-09-26 LAB — POTASSIUM: Potassium: 3.2 mmol/L — ABNORMAL LOW (ref 3.5–5.1)

## 2016-09-26 MED ORDER — POTASSIUM CHLORIDE CRYS ER 20 MEQ PO TBCR
40.0000 meq | EXTENDED_RELEASE_TABLET | Freq: Once | ORAL | Status: DC
Start: 2016-09-26 — End: 2016-09-26

## 2016-09-26 NOTE — Progress Notes (Addendum)
   Name: Warren [name] MRN: [id number] DOB: Jul 05, 1941    ADMISSION DATE:  09/14/2016 CONSULTATION DATE: 10/24  REFERRING MD :  [place] Pink   CHIEF COMPLAINT:  Dyspnea, immunocompromised. ? bronch  BRIEF PATIENT DESCRIPTION:  75 year old male w/ MDS, admitted w/ neutropenia MSSA bacteremia. PCCM asked to see on 10/24  Due to on-going fever and thoughts on bronchoscopy in this setting.   SIGNIFICANT EVENTS  Bone narrow biopsy 10/24>>> Rt Thoracentesis 10/27  STUDIES:  MR ankle: negative for osteo.  TEE 10/24>>> negative for vegetation CT chest 10/24 > multiple nodules in a bronchovascular distribution  Pleural studies 10/27 LDH 236 (serum 287), Protein <3 (serum 5.8) Micro NGTD Cell count WBC 699, 33% L, 4% N, 63% M Cytology pending  BRIEF: 75 y/o male with months long neutropenia related to MDS and chemotherapy in with MSSA bacteremia.  PCCM consulted for evaluation of cough, ongoing fevers, dyspnea.  Subjective: Better but thinks his IS volumes have dropped some  VITAL SIGNS: Temp:  [98.5 F (36.9 C)-98.7 F (37.1 C)] 98.7 F (37.1 C) (10/29 2045) Pulse Rate:  [66-68] 68 (10/29 2045) Resp:  [16-20] 20 (10/29 2045) BP: (146-147)/(73-77) 146/73 (10/29 2045) SpO2:  [98 %] 98 % (10/29 2045) Room air  PHYSICAL EXAMINATION: General:  Resting comfortably in bed HENT: Moist mucus membranes, no thyromegaly, JVD PULM: Clear, No wheeze or crackles, decreased bases. CV: RRR, no MRG GI: BS+, soft, nontender MSK: normal bulk and tone, significant ankle edema today   Recent Labs Lab 09/20/16 0438 09/21/16 0430 09/25/16 0343 09/26/16 0745  NA 136 138 141  --   K 3.9 3.6 3.0* 3.2*  CL 104 103 104  --   CO2 25 26 30   --   BUN 19 19 15   --   CREATININE 1.09 0.94 1.04  --   GLUCOSE 119* 114* 106*  --     Recent Labs Lab 09/24/16 0500 09/25/16 0342 09/26/16 0745  HGB 7.6* 7.7* 7.7*  HCT 24.6* 24.7* 25.4*  WBC 1.0* 1.1* 0.9*  PLT 105* 149* 192   No results  found. Bedside US w/ small amount of re-accumulated right pleural fluid/ specifically on the right. The left looks unchanged.   ASSESSMENT / PLAN: MSSA bacteremia in Neutropenic host Neutropenic fever  MDS Pancytopenia  Dyspnea  H/o volume overload H/o COPD but most recent PFTs with minimal airflow obstruction Multiple pulmonary nodules on CT chest Cough Mild volume overload with ankle edema 09/22/2016 Very small bilateral effusions on CT chest Pleuritic pain 10/27  Discussion  CT scan and lung imaging reviewed. The findings in his lung likely represent septic emboli. Viral panel is negative.  S/p thoracentesis 10/27 with improvement in symptoms. Fluid is exudative. Cultures and cytology are pending  Plan Continue anti-staph therapy as directed by ID Will hold off on bronchoscopy  We will see again Friday as out-pt w/ CXR  Nov 2 at [place] ACNP-BC [place] Pager # [phone] OR # [phone] if no answer

## 2016-09-26 NOTE — Progress Notes (Signed)
Pharmacy Antibiotic Note  Warren [name] is a 75 y.o. male admitted on 09/14/2016 with MSSA bacteremia.  Pharmacy has been consulted for cefazolin dosing.  Today, 09/26/2016: Day #12 total antibiotic therapy (Day #10 from 1st day of repeat blood cx with no growth).  Plan is 4 weeks of IV antibiotics (thru Nov 17).  Renal: stable, WNL  WBC: low  (ANC = 200)  Afebrile  Repeat Bcx are NGTD  TEE without evidence if endocarditis  Plan:  Cefazolin 2gm IV q8h     Do not anticipate dose adjustment, pharmacy will sign off.    Height: 5\' 7"  (170.2 cm) Weight: 163 lb (73.9 kg) IBW/kg (Calculated) : 66.1  Temp (24hrs), Avg:98.6 F (37 C), Min:98.5 F (36.9 C), Max:98.7 F (37.1 C)   Recent Labs Lab 09/20/16 0438 09/21/16 0430 09/22/16 0540 09/23/16 0500 09/24/16 0500 09/25/16 0342 09/25/16 0343  WBC 2.2* 2.1* 1.5* 1.2* 1.0* 1.1*  --   CREATININE 1.09 0.94  --   --   --   --  1.04    Estimated Creatinine Clearance: 55.6 mL/min (by C-G formula based on SCr of 1.04 mg/dL).    Allergies  Allergen Reactions  . Fluconazole     Rash   . Midazolam Other (See Comments)    Agitated caused by versed   Antimicrobials this admission: Resumed Valtrex & Posaconazole PTA 10/19 cefepime >> 10/20 10/19 vanco >> 10/20 10/19 Oseltamvir >> 10/20 10/20 cefazolin >>  Microbiology results:  10/18 BCx: MSSA 2/2 10/19 UCx: NGF 10/21 BCx (repeat): ngtd  Thank you for allowing pharmacy to be a part of this patient's care.  Doreene Eland, PharmD, BCPS.   Pager: [phone] 09/26/2016 7:25 AM

## 2016-09-26 NOTE — Progress Notes (Signed)
Pt was given discharge instructions and prescription for medication. All questions were answered. Pt was taken out via wheelchair by NT to main entrance where wife was waiting. [place]

## 2016-09-27 ENCOUNTER — Encounter: Payer: Self-pay | Admitting: *Deleted

## 2016-09-27 ENCOUNTER — Other Ambulatory Visit: Payer: Self-pay | Admitting: *Deleted

## 2016-09-27 LAB — BODY FLUID CULTURE: CULTURE: NO GROWTH

## 2016-09-27 NOTE — Patient Outreach (Signed)
Transition of care call #1 completed. Pt has follow up appt. He has his meds. He has already had a home health nurse visit and is getting home IV antibiotics. The nurse advocate from [facility] has just called him and it is time for his 2:00 pm dose so he wishes to end the call today. I advised him that my role is to call weekly for the next 30 days to check in on him and ensure he is doing well and be of assistance in any way that I can.  Warren [name] [facility] [place] [phone]

## 2016-09-28 LAB — ADENOSIDE DEAMINASE, PLEURAL FL: ADENOSIDE DEAMINASE, PLEURAL FL: 2 U/L (ref 0.0–9.4)

## 2016-09-29 ENCOUNTER — Ambulatory Visit (INDEPENDENT_AMBULATORY_CARE_PROVIDER_SITE_OTHER)
Admission: RE | Admit: 2016-09-29 | Discharge: 2016-09-29 | Disposition: A | Discharge status: Home / Self Care | Payer: Medicare Other | Source: Ambulatory Visit | Attending: Adult Health | Admitting: Adult Health

## 2016-09-29 ENCOUNTER — Ambulatory Visit (INDEPENDENT_AMBULATORY_CARE_PROVIDER_SITE_OTHER): Payer: Medicare Other | Admitting: Adult Health

## 2016-09-29 ENCOUNTER — Encounter: Payer: Self-pay | Admitting: Adult Health

## 2016-09-29 VITALS — BP 128/68 | HR 72 | Temp 98.4°F | Ht 67.0 in | Wt 167.2 lb

## 2016-09-29 DIAGNOSIS — J90 Pleural effusion, not elsewhere classified: Secondary | ICD-10-CM

## 2016-09-29 DIAGNOSIS — I2510 Atherosclerotic heart disease of native coronary artery without angina pectoris: Secondary | ICD-10-CM

## 2016-09-29 DIAGNOSIS — A4101 Sepsis due to Methicillin susceptible Staphylococcus aureus: Secondary | ICD-10-CM

## 2016-09-29 DIAGNOSIS — I5030 Unspecified diastolic (congestive) heart failure: Secondary | ICD-10-CM | POA: Diagnosis not present

## 2016-09-29 LAB — CHROMOSOME ANALYSIS, BONE MARROW

## 2016-09-29 NOTE — Assessment & Plan Note (Signed)
Recent decompensation -improved with diuresis  Cont on lasix .

## 2016-09-29 NOTE — Patient Instructions (Addendum)
Follow up with Dr. Lake Bells in 3 weeks with chest xray and As needed   Continue on IV Cefazolin as directed.  Follow up with Hematology as planned and As needed   Please contact office for sooner follow up if symptoms do not improve or worsen or seek emergency care

## 2016-09-29 NOTE — Assessment & Plan Note (Signed)
Cont on IV Ancef as directed - clinically improving  follow up cxr in 3 weeks .

## 2016-09-29 NOTE — Assessment & Plan Note (Addendum)
Exudative Bilateral pleural effusion s/p throacentesis -cytology neg  Effusions persist and slightly bigger L>R  Will continue to follow , repeat cxr in 3 weeks  Cont on ABX - hold on additional thoracentesis for now.  Follow clinically as he is improving w/ good O2 sats and no increased wob.  Close follow up with cxr in 3 weeks  Please contact office for sooner follow up if symptoms do not improve or worsen or seek emergency care
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
## 2016-09-30 ENCOUNTER — Telehealth: Payer: Self-pay | Admitting: *Deleted

## 2016-09-30 NOTE — Telephone Encounter (Signed)
Pt called to inquire about when he should restart vidaza apts.  Staff message sent to Dr. Irene Limbo. Scheduling message sent for labs on 11/6 per pt request.

## 2016-10-03 ENCOUNTER — Other Ambulatory Visit: Payer: Self-pay | Admitting: *Deleted

## 2016-10-03 ENCOUNTER — Other Ambulatory Visit (HOSPITAL_BASED_OUTPATIENT_CLINIC_OR_DEPARTMENT_OTHER): Payer: Medicare Other

## 2016-10-03 DIAGNOSIS — D4622 Refractory anemia with excess of blasts 2: Secondary | ICD-10-CM

## 2016-10-03 DIAGNOSIS — A4901 Methicillin susceptible Staphylococcus aureus infection, unspecified site: Secondary | ICD-10-CM | POA: Diagnosis not present

## 2016-10-03 DIAGNOSIS — D571 Sickle-cell disease without crisis: Secondary | ICD-10-CM | POA: Diagnosis not present

## 2016-10-03 DIAGNOSIS — D696 Thrombocytopenia, unspecified: Secondary | ICD-10-CM | POA: Diagnosis not present

## 2016-10-03 DIAGNOSIS — I509 Heart failure, unspecified: Secondary | ICD-10-CM | POA: Diagnosis not present

## 2016-10-03 DIAGNOSIS — M48061 Spinal stenosis, lumbar region without neurogenic claudication: Secondary | ICD-10-CM | POA: Diagnosis not present

## 2016-10-03 DIAGNOSIS — I110 Hypertensive heart disease with heart failure: Secondary | ICD-10-CM | POA: Diagnosis not present

## 2016-10-03 LAB — CBC & DIFF AND RETIC
BASO%: 1.1 % (ref 0.0–2.0)
Basophils Absolute: 0 10*3/uL (ref 0.0–0.1)
EOS%: 1.1 % (ref 0.0–7.0)
Eosinophils Absolute: 0 10*3/uL (ref 0.0–0.5)
HCT: 31 % — ABNORMAL LOW (ref 38.4–49.9)
HGB: 9.1 g/dL — ABNORMAL LOW (ref 13.0–17.1)
IMMATURE RETIC FRACT: 24 % — AB (ref 3.00–10.60)
LYMPH#: 0.8 10*3/uL — AB (ref 0.9–3.3)
LYMPH%: 93.3 % — ABNORMAL HIGH (ref 14.0–49.0)
MCH: 28.6 pg (ref 27.2–33.4)
MCHC: 29.4 g/dL — ABNORMAL LOW (ref 32.0–36.0)
MCV: 97.5 fL (ref 79.3–98.0)
MONO#: 0 10*3/uL — AB (ref 0.1–0.9)
MONO%: 0 % (ref 0.0–14.0)
NEUT%: 4.5 % — ABNORMAL LOW (ref 39.0–75.0)
NEUTROS ABS: 0 10*3/uL — AB (ref 1.5–6.5)
Platelets: 174 10*3/uL (ref 140–400)
RBC: 3.18 10*6/uL — AB (ref 4.20–5.82)
RDW: 21.3 % — ABNORMAL HIGH (ref 11.0–14.6)
RETIC %: 7.53 % — AB (ref 0.80–1.80)
RETIC CT ABS: 239.45 10*3/uL — AB (ref 34.80–93.90)
WBC: 0.9 10*3/uL — AB (ref 4.0–10.3)

## 2016-10-03 LAB — COMPREHENSIVE METABOLIC PANEL
ALBUMIN: 2.8 g/dL — AB (ref 3.5–5.0)
ALK PHOS: 91 U/L (ref 40–150)
ALT: 11 U/L (ref 0–55)
AST: 45 U/L — ABNORMAL HIGH (ref 5–34)
Anion Gap: 8 mEq/L (ref 3–11)
BUN: 14.8 mg/dL (ref 7.0–26.0)
CALCIUM: 8.2 mg/dL — AB (ref 8.4–10.4)
CHLORIDE: 107 meq/L (ref 98–109)
CO2: 27 mEq/L (ref 22–29)
Creatinine: 1 mg/dL (ref 0.7–1.3)
EGFR: 70 mL/min/{1.73_m2} — AB (ref >90)
Glucose: 114 mg/dl (ref 70–140)
POTASSIUM: 3.7 meq/L (ref 3.5–5.1)
SODIUM: 142 meq/L (ref 136–145)
Total Bilirubin: 1.24 mg/dL — ABNORMAL HIGH (ref 0.20–1.20)
Total Protein: 6.7 g/dL (ref 6.4–8.3)

## 2016-10-04 ENCOUNTER — Other Ambulatory Visit: Payer: Self-pay | Admitting: *Deleted

## 2016-10-04 LAB — CHOLESTEROL, BODY FLUID: Cholesterol, Fluid: 25 mg/dL

## 2016-10-04 NOTE — Patient Outreach (Signed)
Transition of care call. Mr. Warren [name] is improving. He says he continues to take the IV antibiotic via his PIC line. His edema that he reported last week has resolved. He asks me questions about [facility] and states he has not received the information yet. I have assured him that he should be receiving it any day. He agrees to continue with the plan for weekly transition of care calls. He is very busy with going to oncology. I reminded him that he can call me for any problem or question.  [name] [facility] [place] [phone]

## 2016-10-04 NOTE — Patient Outreach (Signed)
Follow up c6/17 all for HTN. Pt reported some high blood pressure readings yesterday and we decided I would check in on him today to see what his blood pressure has been running. Readings on 10/03/16 were 140/83 and 151/94. Today's readings were 1676/91 and 137/79. We discusessed possible interventions: 1)Increasing the valsartan which may impact his blood dyscrasia, so that is not desirable and,                                      2)perhaps increasing his furosemide to 40 mg, however, his creatinine is 1.0 Pt does have an appt with Dr. Lysle Rubens next Monday, so for now we agree for pt to continue monitoring his blood pressure bid and record and take these recordings to Dr. Lysle Rubens on Monday for his review and decision.  I will call Mr. Kneece next Monday in the evening to see what the outcome of his MD visit is.  [name] [facility] [place] [phone]

## 2016-10-05 ENCOUNTER — Encounter: Payer: Self-pay | Admitting: *Deleted

## 2016-10-05 DIAGNOSIS — I509 Heart failure, unspecified: Secondary | ICD-10-CM | POA: Diagnosis not present

## 2016-10-05 DIAGNOSIS — I110 Hypertensive heart disease with heart failure: Secondary | ICD-10-CM | POA: Diagnosis not present

## 2016-10-05 DIAGNOSIS — D696 Thrombocytopenia, unspecified: Secondary | ICD-10-CM | POA: Diagnosis not present

## 2016-10-05 DIAGNOSIS — D571 Sickle-cell disease without crisis: Secondary | ICD-10-CM | POA: Diagnosis not present

## 2016-10-05 DIAGNOSIS — A4901 Methicillin susceptible Staphylococcus aureus infection, unspecified site: Secondary | ICD-10-CM | POA: Diagnosis not present

## 2016-10-05 DIAGNOSIS — M48061 Spinal stenosis, lumbar region without neurogenic claudication: Secondary | ICD-10-CM | POA: Diagnosis not present

## 2016-10-05 NOTE — Progress Notes (Addendum)
Patient called stating that [place] is aware and agrees that he will be getting weekly labs on Monday and they can be faxed to [facility] (fax# [phone])

## 2016-10-06 ENCOUNTER — Encounter (HOSPITAL_COMMUNITY): Payer: Self-pay

## 2016-10-10 ENCOUNTER — Other Ambulatory Visit: Payer: Self-pay | Admitting: *Deleted

## 2016-10-10 ENCOUNTER — Other Ambulatory Visit (HOSPITAL_BASED_OUTPATIENT_CLINIC_OR_DEPARTMENT_OTHER): Payer: Medicare Other

## 2016-10-10 ENCOUNTER — Ambulatory Visit (HOSPITAL_BASED_OUTPATIENT_CLINIC_OR_DEPARTMENT_OTHER): Payer: Medicare Other | Admitting: Hematology

## 2016-10-10 ENCOUNTER — Ambulatory Visit: Payer: Medicare Other

## 2016-10-10 ENCOUNTER — Encounter: Payer: Self-pay | Admitting: Hematology

## 2016-10-10 VITALS — BP 138/84 | HR 69 | Temp 97.8°F | Resp 18 | Ht 67.0 in | Wt 159.1 lb

## 2016-10-10 DIAGNOSIS — I509 Heart failure, unspecified: Secondary | ICD-10-CM | POA: Diagnosis not present

## 2016-10-10 DIAGNOSIS — D709 Neutropenia, unspecified: Secondary | ICD-10-CM | POA: Diagnosis not present

## 2016-10-10 DIAGNOSIS — D4622 Refractory anemia with excess of blasts 2: Secondary | ICD-10-CM

## 2016-10-10 DIAGNOSIS — D225 Melanocytic nevi of trunk: Secondary | ICD-10-CM | POA: Diagnosis not present

## 2016-10-10 DIAGNOSIS — D61818 Other pancytopenia: Secondary | ICD-10-CM

## 2016-10-10 DIAGNOSIS — L821 Other seborrheic keratosis: Secondary | ICD-10-CM | POA: Diagnosis not present

## 2016-10-10 DIAGNOSIS — L720 Epidermal cyst: Secondary | ICD-10-CM | POA: Diagnosis not present

## 2016-10-10 DIAGNOSIS — I110 Hypertensive heart disease with heart failure: Secondary | ICD-10-CM | POA: Diagnosis not present

## 2016-10-10 DIAGNOSIS — M48061 Spinal stenosis, lumbar region without neurogenic claudication: Secondary | ICD-10-CM | POA: Diagnosis not present

## 2016-10-10 DIAGNOSIS — D1801 Hemangioma of skin and subcutaneous tissue: Secondary | ICD-10-CM | POA: Diagnosis not present

## 2016-10-10 DIAGNOSIS — D469 Myelodysplastic syndrome, unspecified: Secondary | ICD-10-CM

## 2016-10-10 DIAGNOSIS — D696 Thrombocytopenia, unspecified: Secondary | ICD-10-CM | POA: Diagnosis not present

## 2016-10-10 DIAGNOSIS — D2272 Melanocytic nevi of left lower limb, including hip: Secondary | ICD-10-CM | POA: Diagnosis not present

## 2016-10-10 DIAGNOSIS — D509 Iron deficiency anemia, unspecified: Secondary | ICD-10-CM

## 2016-10-10 DIAGNOSIS — D708 Other neutropenia: Secondary | ICD-10-CM

## 2016-10-10 DIAGNOSIS — A4901 Methicillin susceptible Staphylococcus aureus infection, unspecified site: Secondary | ICD-10-CM | POA: Diagnosis not present

## 2016-10-10 DIAGNOSIS — D571 Sickle-cell disease without crisis: Secondary | ICD-10-CM | POA: Diagnosis not present

## 2016-10-10 LAB — CBC & DIFF AND RETIC
BASO%: 2.1 % — AB (ref 0.0–2.0)
Basophils Absolute: 0 10*3/uL (ref 0.0–0.1)
EOS%: 2.1 % (ref 0.0–7.0)
Eosinophils Absolute: 0 10*3/uL (ref 0.0–0.5)
HCT: 31 % — ABNORMAL LOW (ref 38.4–49.9)
HGB: 9.2 g/dL — ABNORMAL LOW (ref 13.0–17.1)
Immature Retic Fract: 17.2 % — ABNORMAL HIGH (ref 3.00–10.60)
LYMPH%: 87.5 % — AB (ref 14.0–49.0)
MCH: 29.1 pg (ref 27.2–33.4)
MCHC: 29.7 g/dL — AB (ref 32.0–36.0)
MCV: 98.1 fL — AB (ref 79.3–98.0)
MONO#: 0 10*3/uL — ABNORMAL LOW (ref 0.1–0.9)
MONO%: 3.1 % (ref 0.0–14.0)
NEUT%: 5.2 % — ABNORMAL LOW (ref 39.0–75.0)
NEUTROS ABS: 0.1 10*3/uL — AB (ref 1.5–6.5)
Platelets: 135 10*3/uL — ABNORMAL LOW (ref 140–400)
RBC: 3.16 10*6/uL — AB (ref 4.20–5.82)
RDW: 21.6 % — ABNORMAL HIGH (ref 11.0–14.6)
Retic %: 5.08 % — ABNORMAL HIGH (ref 0.80–1.80)
Retic Ct Abs: 160.53 10*3/uL — ABNORMAL HIGH (ref 34.80–93.90)
WBC: 1 10*3/uL — AB (ref 4.0–10.3)
lymph#: 0.8 10*3/uL — ABNORMAL LOW (ref 0.9–3.3)

## 2016-10-10 LAB — COMPREHENSIVE METABOLIC PANEL
ALBUMIN: 3 g/dL — AB (ref 3.5–5.0)
ALK PHOS: 88 U/L (ref 40–150)
ALT: 10 U/L (ref 0–55)
ANION GAP: 8 meq/L (ref 3–11)
AST: 41 U/L — AB (ref 5–34)
BUN: 17.9 mg/dL (ref 7.0–26.0)
CALCIUM: 8.5 mg/dL (ref 8.4–10.4)
CO2: 25 mEq/L (ref 22–29)
CREATININE: 1 mg/dL (ref 0.7–1.3)
Chloride: 106 mEq/L (ref 98–109)
EGFR: 74 mL/min/{1.73_m2} — ABNORMAL LOW (ref >90)
Glucose: 104 mg/dl (ref 70–140)
POTASSIUM: 4 meq/L (ref 3.5–5.1)
Sodium: 139 mEq/L (ref 136–145)
Total Bilirubin: 1.31 mg/dL — ABNORMAL HIGH (ref 0.20–1.20)
Total Protein: 6.5 g/dL (ref 6.4–8.3)

## 2016-10-10 LAB — TECHNOLOGIST REVIEW

## 2016-10-10 MED ORDER — SODIUM CHLORIDE 0.9 % IJ SOLN
10.0000 mL | INTRAMUSCULAR | Status: DC | PRN
  Administered 2016-10-10: 10 mL
  Filled 2016-10-10: qty 10

## 2016-10-10 MED ORDER — HEPARIN SOD (PORK) LOCK FLUSH 100 UNIT/ML IV SOLN
500.0000 [IU] | Freq: Once | INTRAVENOUS | Status: AC | PRN
  Administered 2016-10-10: 250 [IU]
  Filled 2016-10-10: qty 5

## 2016-10-10 NOTE — Progress Notes (Signed)
Reviewed, agree 

## 2016-10-10 NOTE — Patient Outreach (Signed)
Transition of care call. Mr. Caulk is feeling very well. He continues the [facility] line IV treatment tid. He is increasing his activity and can walk 2 miles on his treadmill. He has been able to increase his success on his spirometer over the last week.  In reviewing pt chart, he was diagnosed with the myelodysplastic syndrome in April of 2017. He has seen a bone marrow transplant specialist at [facility] and will see her again tomorrow. He shares that depending on what she says tomorrow will depend on whether he will have the bone marrow transplant.  He also reports that his blood pressure readings have come down without any intervention. The last 2 readings were 143/76 and 144/86.  I have advised I will call him again next week. I have reminded him he can call and ask me questions or for problems. He has my phone number.  [name] [facility] [place] [phone]

## 2016-10-11 DIAGNOSIS — Z005 Encounter for examination of potential donor of organ and tissue: Secondary | ICD-10-CM | POA: Diagnosis not present

## 2016-10-11 DIAGNOSIS — Z7682 Awaiting organ transplant status: Secondary | ICD-10-CM | POA: Diagnosis not present

## 2016-10-11 DIAGNOSIS — J869 Pyothorax without fistula: Secondary | ICD-10-CM | POA: Diagnosis not present

## 2016-10-11 DIAGNOSIS — D61818 Other pancytopenia: Secondary | ICD-10-CM | POA: Diagnosis not present

## 2016-10-11 DIAGNOSIS — R7881 Bacteremia: Secondary | ICD-10-CM | POA: Diagnosis not present

## 2016-10-11 DIAGNOSIS — D469 Myelodysplastic syndrome, unspecified: Secondary | ICD-10-CM | POA: Diagnosis not present

## 2016-10-11 DIAGNOSIS — Z87891 Personal history of nicotine dependence: Secondary | ICD-10-CM | POA: Diagnosis not present

## 2016-10-11 DIAGNOSIS — R5383 Other fatigue: Secondary | ICD-10-CM | POA: Diagnosis not present

## 2016-10-11 DIAGNOSIS — I10 Essential (primary) hypertension: Secondary | ICD-10-CM | POA: Diagnosis not present

## 2016-10-11 DIAGNOSIS — Z801 Family history of malignant neoplasm of trachea, bronchus and lung: Secondary | ICD-10-CM | POA: Diagnosis not present

## 2016-10-11 DIAGNOSIS — Z79899 Other long term (current) drug therapy: Secondary | ICD-10-CM | POA: Diagnosis not present

## 2016-10-11 DIAGNOSIS — D46Z Other myelodysplastic syndromes: Secondary | ICD-10-CM | POA: Diagnosis not present

## 2016-10-11 DIAGNOSIS — D333 Benign neoplasm of cranial nerves: Secondary | ICD-10-CM | POA: Diagnosis not present

## 2016-10-11 DIAGNOSIS — Z7982 Long term (current) use of aspirin: Secondary | ICD-10-CM | POA: Diagnosis not present

## 2016-10-11 DIAGNOSIS — Z6824 Body mass index (BMI) 24.0-24.9, adult: Secondary | ICD-10-CM | POA: Diagnosis not present

## 2016-10-11 DIAGNOSIS — R21 Rash and other nonspecific skin eruption: Secondary | ICD-10-CM | POA: Diagnosis not present

## 2016-10-11 DIAGNOSIS — D709 Neutropenia, unspecified: Secondary | ICD-10-CM | POA: Diagnosis not present

## 2016-10-11 DIAGNOSIS — G4733 Obstructive sleep apnea (adult) (pediatric): Secondary | ICD-10-CM | POA: Diagnosis not present

## 2016-10-11 DIAGNOSIS — Z9221 Personal history of antineoplastic chemotherapy: Secondary | ICD-10-CM | POA: Diagnosis not present

## 2016-10-11 DIAGNOSIS — Z8546 Personal history of malignant neoplasm of prostate: Secondary | ICD-10-CM | POA: Diagnosis not present

## 2016-10-11 DIAGNOSIS — A4901 Methicillin susceptible Staphylococcus aureus infection, unspecified site: Secondary | ICD-10-CM | POA: Diagnosis not present

## 2016-10-11 DIAGNOSIS — Z8042 Family history of malignant neoplasm of prostate: Secondary | ICD-10-CM | POA: Diagnosis not present

## 2016-10-12 ENCOUNTER — Telehealth: Payer: Self-pay | Admitting: *Deleted

## 2016-10-12 NOTE — Telephone Encounter (Signed)
-----   Message from Georgena Spurling, [place] sent at 10/10/2016  4:46 PM EST ----- Regarding: regarding picc line Patient is set to end his IV antibiotics and have his picc pulled on 10/14/16. Patient thought the antibiotic was suppose to be 6-8 wks. He is receiving cancer treatment and also is in consideration for a stem cell transplant at [facility] on 10/17/16. Could you possibly call the patient on his cell at [phone]. He is worried and would like to see you prior to ending the antibiotics. Please advise Thanks, Kennyth Lose

## 2016-10-12 NOTE — Telephone Encounter (Signed)
Spoke to Dr. Linus Salmons this morning and gave a verbal order per MD to stop IV antibiotic on 11/17 and picc can be maintained until his first hosp f/u with Dr. Megan Salon on 10/17/16. Called the patient back and he said he saw the ID doctor at [facility] and the IV antibiotic is going to be extended by the MD there. Tried to explain to patient that if this occurs that MD would need to take over the IV antibiotic care and give future orders to [place]. He said that the ID MD at [facility] is going to call Dr. Linus Salmons hopefully today and "try to convince him to extend the IV antibiotic". So for now the verbal is to just maintain port until patient establishes care at [facility]. Myrtis Hopping CMA

## 2016-10-13 ENCOUNTER — Telehealth: Payer: Self-pay | Admitting: Hematology

## 2016-10-13 ENCOUNTER — Other Ambulatory Visit: Payer: Self-pay | Admitting: Hematology

## 2016-10-13 ENCOUNTER — Telehealth: Payer: Self-pay | Admitting: *Deleted

## 2016-10-13 ENCOUNTER — Ambulatory Visit (INDEPENDENT_AMBULATORY_CARE_PROVIDER_SITE_OTHER)
Admission: RE | Admit: 2016-10-13 | Discharge: 2016-10-13 | Disposition: A | Discharge status: Home / Self Care | Payer: Medicare Other | Source: Ambulatory Visit | Attending: Adult Health | Admitting: Adult Health

## 2016-10-13 ENCOUNTER — Ambulatory Visit (INDEPENDENT_AMBULATORY_CARE_PROVIDER_SITE_OTHER): Payer: Medicare Other | Admitting: Adult Health

## 2016-10-13 ENCOUNTER — Encounter: Payer: Self-pay | Admitting: Adult Health

## 2016-10-13 VITALS — BP 106/76 | HR 71 | Temp 98.1°F | Ht 67.0 in | Wt 159.2 lb

## 2016-10-13 DIAGNOSIS — J90 Pleural effusion, not elsewhere classified: Secondary | ICD-10-CM | POA: Diagnosis not present

## 2016-10-13 DIAGNOSIS — A4101 Sepsis due to Methicillin susceptible Staphylococcus aureus: Secondary | ICD-10-CM | POA: Diagnosis not present

## 2016-10-13 DIAGNOSIS — I2510 Atherosclerotic heart disease of native coronary artery without angina pectoris: Secondary | ICD-10-CM | POA: Diagnosis not present

## 2016-10-13 DIAGNOSIS — I5031 Acute diastolic (congestive) heart failure: Secondary | ICD-10-CM

## 2016-10-13 MED ORDER — CIPROFLOXACIN HCL 500 MG PO TABS: 500.0000 mg | ORAL_TABLET | Freq: Two times a day (BID) | ORAL | 1 refills | Status: DC

## 2016-10-13 NOTE — Progress Notes (Signed)
Subjective:    Patient ID: Warren [name], male    DOB: 12/05/1940, 75 y.o.   MRN: [id number]  HPI 75 year old male former smoker followed for obstructive sleep apnea and lung nodule, dyspnea with scoliosis  Has Myelodysplastic syndrome followed by Hem/Onc complicated by neutropenia /anemia   TEST  PFT 10/28/2010: Mild obstructive airways disease in small airways with insignificant response to bronchodilator. Increased diffusion capacity raised question of increased blood flow. FEV1 3.00/110%, FEV1/FVC 0.61, FEF 25-75% 1.23/49%. TLC 117%, DLCO 167%. CT chest 10/13/09 compared w/ 2006: IMPRESSION:  1. Stable small lung nodules.  2. No mediastinal or hilar adenopathy.  3. Stable hepatic cysts.  4. Mild distal esophageal wall thickening is a stable finding.   PFT 09/24/2014-within normal limits. Maybe minimal obstructive airways disease and minimal small airway response to bronchodilator. Normal lung volumes and diffusion. NPSG 05/21/99- moderate OSA, AHI 19 per hour, weight 165 pounds Office Spirometry 03/09/2016-mild obstruction small airways. FVC 4.17/102%, FEV1 2.69/87%, FEV1/FVC 0.64, FEF 25-75 percent 1.59. FEF 25-75 percent was normal on formal PFT in 2015.   10/13/2016 Follow up : Bacteremia /Pleural Effusion  Pt returns for 2 week follow up .  Was admitted October 18 through October 30 for Neutropenic fevers, staph aureus bacteremia with sepsis. And fluid overload Patient was treated with aggressive IV antibiotics and diuresis. He underwent a trans-esophageal echo that was negative for vegetations. Has  a myelodysplastic syndrome and is followed by hematology with chemo prior to admission . Has known  neutropenia with refractory anemia.he did undergo throacentesis on 10/27 w/ 675cc of rust coloed pleural fluid  . Cytology neg for malignant cells. Exudative effusion .  CT chest showed no pulmonary embolism. Small layering bilateral pleural effusions. Multiple small nodules throughout  lungs. ? Possible  septic emboli from bacteremia. He was seen by ID , on cefazolin IV . He remains on this . He says he is currently being evaluated for stem cell transplant and will be on IV cefazolin thru the end of the month. He is following with ID and Hematology at [facility].  Today he says he is feeling much better. Energy level is improving and he is now back to walking 2 miles with better times. (46min ) . He denies chest pain,orthopnea, increased edema or hemoptysis . No n/v/d.  He remains neutropenic with WBC 1K on lab review from 10/10/16 .  CXR today is improved w/ decreased effusions and improved aeration .      Past Medical History:  Diagnosis Date  . Cancer [facility])    Prostate, Melanoma- lt. Shoulder (no further problems since 2010  . CHF (congestive heart failure) ([place]) 09/13/2016  . Complication of anesthesia    versed gave the adverse reaction pt. ,became aggitated  . COPD (chronic obstructive pulmonary disease) (HCC)    Dr. Annamaria Boots follows  . Diffuse esophageal spasm   . Diverticulosis   . DJD (degenerative joint disease)   . Multiple trauma     horse accident 2003 L SHOULDER  . Oral herpes   . OSA (obstructive sleep apnea)    mild, rare cpap use  . Peripheral neuropathy ([place])   . Pulmonary nodule   . Spinal stenosis   . Systolic hypertension    Current Outpatient Prescriptions on File Prior to Visit  Medication Sig Dispense Refill  . calcium carbonate (TUMS - DOSED IN MG ELEMENTAL CALCIUM) 500 MG chewable tablet Chew 1 tablet by mouth at bedtime.     [name] ceFAZolin (ANCEF)  2-4 GM/100ML-% IVPB Inject 100 mLs (2 g total) into the vein every 8 (eight) hours. 1 each 0  . chlorhexidine (PERIDEX) 0.12 % solution RINSE 10 ML (CC) IN MOUTH TWICE DAILY AS DIRECTED **SWISH  AND  SPIT,  DO  NOT  SWALLOW** 473 mL 1  . furosemide (LASIX) 20 MG tablet Take 1 tablet (20 mg total) by mouth daily. 30 tablet 0  . magic mouthwash SOLN Take 5 mLs by mouth 4 (four) times daily as needed for  mouth pain. 480 mL 0  . Multiple Vitamin (MULTIVITAMIN WITH MINERALS) TABS tablet Take 1 tablet by mouth daily.    . nitroGLYCERIN (NITROSTAT) 0.4 MG SL tablet Place 1 tablet (0.4 mg total) under the tongue every 5 (five) minutes as needed for chest pain. 25 tablet 1  . omeprazole (PRILOSEC) 40 MG capsule Take 40 mg by mouth daily before lunch. 1/2 hour before.    . ondansetron (ZOFRAN) 4 MG tablet Take 4 mg by mouth every 8 (eight) hours as needed for nausea or vomiting.     . posaconazole (NOXAFIL) 100 MG TBEC delayed-release tablet Take 3 tablets (300 mg total) by mouth daily. (Patient taking differently: Take 300 mg by mouth at bedtime. ) 90 tablet 0  . potassium chloride (K-DUR) 10 MEQ tablet Take 2 tablets (20 mEq total) by mouth daily. 60 tablet 0  . senna-docusate (SENNA S) 8.6-50 MG tablet Take 2 tablets by mouth 2 (two) times daily as needed for mild constipation or moderate constipation.    . valACYclovir (VALTREX) 500 MG tablet Take 1 tablet (500 mg total) by mouth 2 (two) times daily. At night to suppress herpes gingivitis 60 tablet 1  . valsartan (DIOVAN) 80 MG tablet Take 40 mg by mouth at bedtime.      No current facility-administered medications on file prior to visit.      Review of Systems Constitutional:   No  weight loss, night sweats,  Fevers, chills,  +fatigue, or  lassitude.  HEENT:   No headaches,  Difficulty swallowing,  Tooth/dental problems, or  Sore throat,                No sneezing, itching, ear ache, nasal congestion, post nasal drip,   CV:  No chest pain,  Orthopnea, PND, , anasarca, dizziness, palpitations, syncope.   GI  No heartburn, indigestion, abdominal pain, nausea, vomiting, diarrhea, change in bowel habits, loss of appetite, bloody stools.   Resp:    No chest wall deformity  Skin: no rash or lesions.  GU: no dysuria, change in color of urine, no urgency or frequency.  No flank pain, no hematuria   MS:  No joint pain or swelling.  No decreased  range of motion.  No back pain.  Psych:  No change in mood or affect. No depression or anxiety.  No memory loss.         Objective:   Physical Exam Vitals:   10/13/16 1446  BP: 106/76  Pulse: 71  Temp: 98.1 F (36.7 C)  TempSrc: Oral  SpO2: 98%  Weight: 159 lb 3.2 oz (72.2 kg)  Height: 5\' 7"  (1.702 m)   GEN: A/Ox3; pleasant , NAD, chronically ill appearing    HEENT:  [place]/AT,  EACs-clear, TMs-wnl, NOSE-clear, THROAT-clear, no lesions, no postnasal drip or exudate noted.   NECK:  Supple w/ fair ROM; no JVD; normal carotid impulses w/o bruits; no thyromegaly or nodules palpated; no lymphadenopathy.    RESP  Decreased BS  in bases , no accessory muscle use, no dullness to percussion  CARD:  RRR, no m/r/g  , 1+  peripheral edema, pulses intact, no cyanosis or clubbing.  GI:   Soft & nt; nml bowel sounds; no organomegaly or masses detected.   Musco: Warm bil, no deformities or joint swelling noted.   Neuro: alert, no focal deficits noted.    Skin: Warm, no lesions or rashes  CXR 10/13/2016  Reviewed independently  Decreased pleural effusion and improved aeration .    Ameli Sangiovanni NP-C  [facility]  10/13/2016        Assessment & Plan:

## 2016-10-13 NOTE — Telephone Encounter (Signed)
confirmed lab appt date/time 11/20 per LOS

## 2016-10-13 NOTE — Telephone Encounter (Signed)
Verbal order per Dr. Linus Salmons given to [facility] [place] to extend IV antibiotic until 10/28/16. Myrtis Hopping

## 2016-10-13 NOTE — Assessment & Plan Note (Signed)
Recent admission with Staph Aureus Bacteremia in setting of MDS on chemo w/ neutropenia . He is doing well on prolonged IV abx. CXR is much improved and clinically he is doing well and is able to increased activity level with exercise.  He cointinues to follow with local ID clinic along with [facility] ID and Hematology  Awaiting possible stem cell transplant workup .  Will continue to follow closely .   Plan  Patient Instructions  Continue on current regimen  Follow up with Dr. Linus Salmons as planned Follow up with [facility] as planned Follow up with Dr. Lake Bells in 2 weeks and As needed   Please contact office for sooner follow up if symptoms do not improve or worsen or seek emergency care

## 2016-10-13 NOTE — Telephone Encounter (Signed)
-----   Message from Thayer Headings, MD sent at 10/12/2016  3:58 PM EST ----- Regarding: RE: regarding picc line Kennyth Lose, I spoke with the patient and with ID at [facility] who he has seen and I want to extend his IV antibiotics through December 1st and cancel his appt with Megan Salon next week and get him in with me the week of Nov 27th.   Lynnda Shields Can we just add the am of the 27th for my hsfus as an additional clinic for me?  Then put him there and some others I need to see.  There also is some time on the 29th if the Hep C isn't filling up.   Rob ----- Message ----- From: Georgena Spurling, CMA Sent: 10/10/2016   4:46 PM To: Georgena Spurling, CMA, # Subject: regarding picc line                            Patient is set to end his IV antibiotics and have his picc pulled on 10/14/16. Patient thought the antibiotic was suppose to be 6-8 wks. He is receiving cancer treatment and also is in consideration for a stem cell transplant at [facility] on 10/17/16. Could you possibly call the patient on his cell at [phone]. He is worried and would like to see you prior to ending the antibiotics. Please advise Thanks, Kennyth Lose

## 2016-10-13 NOTE — Assessment & Plan Note (Signed)
S/p thoracentesis w/ cytology neg for malignant cells.  CXR today is much improved w/ near total resolution .  Plan  Cont on current regimen.

## 2016-10-13 NOTE — Patient Instructions (Signed)
Continue on current regimen  Follow up with Dr. Linus Salmons as planned Follow up with [facility] as planned Follow up with Dr. Lake Bells in 2 weeks and As needed   Please contact office for sooner follow up if symptoms do not improve or worsen or seek emergency care

## 2016-10-14 ENCOUNTER — Other Ambulatory Visit: Payer: Self-pay | Admitting: *Deleted

## 2016-10-14 DIAGNOSIS — D469 Myelodysplastic syndrome, unspecified: Secondary | ICD-10-CM

## 2016-10-14 NOTE — Progress Notes (Signed)
Reviewed, agree with this plan of care 

## 2016-10-17 ENCOUNTER — Ambulatory Visit: Payer: Medicare Other

## 2016-10-17 ENCOUNTER — Other Ambulatory Visit: Payer: Self-pay | Admitting: *Deleted

## 2016-10-17 ENCOUNTER — Encounter: Payer: Self-pay | Admitting: *Deleted

## 2016-10-17 ENCOUNTER — Inpatient Hospital Stay: Payer: Medicare Other | Admitting: Internal Medicine

## 2016-10-17 ENCOUNTER — Other Ambulatory Visit (HOSPITAL_BASED_OUTPATIENT_CLINIC_OR_DEPARTMENT_OTHER): Payer: Medicare Other

## 2016-10-17 DIAGNOSIS — D4622 Refractory anemia with excess of blasts 2: Secondary | ICD-10-CM

## 2016-10-17 DIAGNOSIS — D509 Iron deficiency anemia, unspecified: Secondary | ICD-10-CM

## 2016-10-17 DIAGNOSIS — D696 Thrombocytopenia, unspecified: Secondary | ICD-10-CM | POA: Diagnosis not present

## 2016-10-17 DIAGNOSIS — A4901 Methicillin susceptible Staphylococcus aureus infection, unspecified site: Secondary | ICD-10-CM | POA: Diagnosis not present

## 2016-10-17 DIAGNOSIS — I509 Heart failure, unspecified: Secondary | ICD-10-CM | POA: Diagnosis not present

## 2016-10-17 DIAGNOSIS — D571 Sickle-cell disease without crisis: Secondary | ICD-10-CM | POA: Diagnosis not present

## 2016-10-17 DIAGNOSIS — D469 Myelodysplastic syndrome, unspecified: Secondary | ICD-10-CM | POA: Diagnosis not present

## 2016-10-17 DIAGNOSIS — M48061 Spinal stenosis, lumbar region without neurogenic claudication: Secondary | ICD-10-CM | POA: Diagnosis not present

## 2016-10-17 DIAGNOSIS — I110 Hypertensive heart disease with heart failure: Secondary | ICD-10-CM | POA: Diagnosis not present

## 2016-10-17 LAB — CBC & DIFF AND RETIC
BASO%: 1 % (ref 0.0–2.0)
BASOS ABS: 0 10*3/uL (ref 0.0–0.1)
EOS ABS: 0 10*3/uL (ref 0.0–0.5)
EOS%: 2.1 % (ref 0.0–7.0)
HEMATOCRIT: 30.2 % — AB (ref 38.4–49.9)
HGB: 9 g/dL — ABNORMAL LOW (ref 13.0–17.1)
IMMATURE RETIC FRACT: 19.2 % — AB (ref 3.00–10.60)
LYMPH#: 0.9 10*3/uL (ref 0.9–3.3)
LYMPH%: 90.7 % — AB (ref 14.0–49.0)
MCH: 29.3 pg (ref 27.2–33.4)
MCHC: 29.8 g/dL — AB (ref 32.0–36.0)
MCV: 98.4 fL — ABNORMAL HIGH (ref 79.3–98.0)
MONO#: 0 10*3/uL — AB (ref 0.1–0.9)
MONO%: 2.1 % (ref 0.0–14.0)
NEUT#: 0 10*3/uL — CL (ref 1.5–6.5)
NEUT%: 4.1 % — AB (ref 39.0–75.0)
PLATELETS: 69 10*3/uL — AB (ref 140–400)
RBC: 3.07 10*6/uL — ABNORMAL LOW (ref 4.20–5.82)
RDW: 21.7 % — ABNORMAL HIGH (ref 11.0–14.6)
RETIC %: 2.69 % — AB (ref 0.80–1.80)
RETIC CT ABS: 82.58 10*3/uL (ref 34.80–93.90)
WBC: 1 10*3/uL — ABNORMAL LOW (ref 4.0–10.3)
nRBC: 0 % (ref 0–0)

## 2016-10-17 MED ORDER — HEPARIN SOD (PORK) LOCK FLUSH 100 UNIT/ML IV SOLN
250.0000 [IU] | Freq: Once | INTRAVENOUS | Status: AC
  Administered 2016-10-17: 250 [IU]
  Filled 2016-10-17: qty 5

## 2016-10-17 MED ORDER — SODIUM CHLORIDE 0.9% FLUSH
10.0000 mL | Freq: Once | INTRAVENOUS | Status: AC
  Administered 2016-10-17: 10 mL
  Filled 2016-10-17: qty 10

## 2016-10-17 NOTE — Progress Notes (Signed)
Received phone call from RN @ [facility] home [facility].  Pt has been having weekly labs drawn by [facility] as well as at [facility]  Per pt, he would like to only have labs drawn at [facility] in order to avoid duplicate lab draws.  [facility] RN requested copy of labs be sent to [facility] [phone]) and Dr. Megan Salon [phone]).  ESR requested to be drawn in addition to CBC/CMP.  Order placed.  Will fax results

## 2016-10-18 LAB — SEDIMENTATION RATE: Sedimentation Rate-Westergren: 2 mm/h (ref 0–30)

## 2016-10-18 NOTE — Patient Outreach (Signed)
Transition of care call. Mr. Prinz is feeling well. He has been to [facility] to his specialist there. They are going to go forward with the stem cell procedure. I have reminded him if he has any difficulties or problems to please call me. I will reach out to him next week.  [name] [facility] [phone]

## 2016-10-20 DIAGNOSIS — D469 Myelodysplastic syndrome, unspecified: Secondary | ICD-10-CM | POA: Diagnosis not present

## 2016-10-20 DIAGNOSIS — Z005 Encounter for examination of potential donor of organ and tissue: Secondary | ICD-10-CM | POA: Diagnosis not present

## 2016-10-21 ENCOUNTER — Telehealth: Payer: Self-pay | Admitting: Pulmonary Disease

## 2016-10-21 NOTE — Telephone Encounter (Signed)
Pt called about needing rx refilled for furosemide (LASIX) 20 MG tablet and potassium chloride (K-DUR) 10 MEQ tablet these medications was prescribed in the hospital. Please advise?  Call pt @ [phone]. Thank you!

## 2016-10-21 NOTE — Telephone Encounter (Signed)
Patient called to see if he needs to continue Lasix and potassium, if so rx needs to be sent to [facility] on [address]. Patient can be reached at [phone] -pr

## 2016-10-21 NOTE — Telephone Encounter (Signed)
atc pt, no answer, no vm.  Wcb.  

## 2016-10-22 ENCOUNTER — Encounter (HOSPITAL_COMMUNITY): Payer: Self-pay | Admitting: Nurse Practitioner

## 2016-10-22 ENCOUNTER — Telehealth: Payer: Self-pay | Admitting: Infectious Disease

## 2016-10-22 ENCOUNTER — Emergency Department (HOSPITAL_COMMUNITY)
Admission: EM | Admit: 2016-10-22 | Discharge: 2016-10-22 | Disposition: A | Discharge status: Home / Self Care | Payer: Medicare Other | Attending: Emergency Medicine | Admitting: Emergency Medicine

## 2016-10-22 DIAGNOSIS — I2510 Atherosclerotic heart disease of native coronary artery without angina pectoris: Secondary | ICD-10-CM | POA: Insufficient documentation

## 2016-10-22 DIAGNOSIS — R233 Spontaneous ecchymoses: Secondary | ICD-10-CM | POA: Diagnosis present

## 2016-10-22 DIAGNOSIS — I5031 Acute diastolic (congestive) heart failure: Secondary | ICD-10-CM | POA: Insufficient documentation

## 2016-10-22 DIAGNOSIS — Z79899 Other long term (current) drug therapy: Secondary | ICD-10-CM | POA: Diagnosis not present

## 2016-10-22 DIAGNOSIS — Z8546 Personal history of malignant neoplasm of prostate: Secondary | ICD-10-CM | POA: Diagnosis not present

## 2016-10-22 DIAGNOSIS — Z87891 Personal history of nicotine dependence: Secondary | ICD-10-CM | POA: Diagnosis not present

## 2016-10-22 DIAGNOSIS — J449 Chronic obstructive pulmonary disease, unspecified: Secondary | ICD-10-CM | POA: Insufficient documentation

## 2016-10-22 DIAGNOSIS — I110 Hypertensive heart disease with heart failure: Secondary | ICD-10-CM | POA: Diagnosis not present

## 2016-10-22 DIAGNOSIS — R21 Rash and other nonspecific skin eruption: Secondary | ICD-10-CM

## 2016-10-22 LAB — CBC WITH DIFFERENTIAL/PLATELET
BASOS PCT: 0 %
Basophils Absolute: 0 10*3/uL (ref 0.0–0.1)
EOS PCT: 3 %
Eosinophils Absolute: 0 10*3/uL (ref 0.0–0.7)
HEMATOCRIT: 29 % — AB (ref 39.0–52.0)
HEMOGLOBIN: 8.8 g/dL — AB (ref 13.0–17.0)
LYMPHS PCT: 84 %
Lymphs Abs: 1.1 10*3/uL (ref 0.7–4.0)
MCH: 30.2 pg (ref 26.0–34.0)
MCHC: 30.3 g/dL (ref 30.0–36.0)
MCV: 99.7 fL (ref 78.0–100.0)
Monocytes Absolute: 0 10*3/uL — ABNORMAL LOW (ref 0.1–1.0)
Monocytes Relative: 3 %
NEUTROS PCT: 10 %
Neutro Abs: 0.1 10*3/uL — ABNORMAL LOW (ref 1.7–7.7)
Platelets: 69 10*3/uL — ABNORMAL LOW (ref 150–400)
RBC: 2.91 MIL/uL — ABNORMAL LOW (ref 4.22–5.81)
RDW: 21.1 % — ABNORMAL HIGH (ref 11.5–15.5)
WBC: 1.2 10*3/uL — CL (ref 4.0–10.5)

## 2016-10-22 LAB — COMPREHENSIVE METABOLIC PANEL
ALK PHOS: 66 U/L (ref 38–126)
ALT: 14 U/L — AB (ref 17–63)
AST: 61 U/L — ABNORMAL HIGH (ref 15–41)
Albumin: 3.5 g/dL (ref 3.5–5.0)
Anion gap: 6 (ref 5–15)
BILIRUBIN TOTAL: 1.5 mg/dL — AB (ref 0.3–1.2)
BUN: 23 mg/dL — ABNORMAL HIGH (ref 6–20)
CALCIUM: 8.5 mg/dL — AB (ref 8.9–10.3)
CO2: 27 mmol/L (ref 22–32)
CREATININE: 1.06 mg/dL (ref 0.61–1.24)
Chloride: 107 mmol/L (ref 101–111)
Glucose, Bld: 126 mg/dL — ABNORMAL HIGH (ref 65–99)
Potassium: 3.7 mmol/L (ref 3.5–5.1)
Sodium: 140 mmol/L (ref 135–145)
TOTAL PROTEIN: 6.3 g/dL — AB (ref 6.5–8.1)

## 2016-10-22 LAB — PROTIME-INR
INR: 1.08
PROTHROMBIN TIME: 14.1 s (ref 11.4–15.2)

## 2016-10-22 NOTE — ED Triage Notes (Signed)
Patient presents to [facility]ED for concerns of petechiae that appeared on his hands today. He consulted his physician who recommended that he come to the ED as he was recently hospitalized for a staph infection and is now receiving ancef at home via his picc. He also knows that his neutrophil count is 0.0 from labs drawn this week. His last chemo was 09/06/16.

## 2016-10-22 NOTE — Discharge Instructions (Signed)
If you start feeling ill in anyway including fevers, headache, chest pain, shortness of breath, or worsening rash, return to the ER immediately. Follow-up with Dr. Linus Salmons as scheduled in 2 days.

## 2016-10-22 NOTE — Telephone Encounter (Signed)
RECEIVED  CALL FROM SON (PULOMANARY MD IN [facility]) RE DAD WHO IS BEING RX FOR MSSA ENDOCARDITIS  HE ALSO HAS MDS ON CHEMO --THOUGH HELD RECENTLY  PLATELETS DROPPED PRECIPTOUSLY THIS WEEK TO 65K  PT DEVELOOPED PETECHIAL RASH  CONCERN FOR WORSENING TTPENIA, NEW INFECTION  PT GO TO ED  RECOMMEND  REPEAT STAT CBC W DIFF  CLOOD CX X TWO PERIPHERAL SITES  IF THIS IS DUE TO DRUG REACTION  PT WILL NEED TO BE CHANGED TO VANCOMYCIN AND THIS ABX ARRANGED FO HOME HEALTH  IF SUSPICION FOR NEW INFECTION, ENDOCARDITIS MAY NEED TO BE ADMITTED

## 2016-10-22 NOTE — ED Notes (Signed)
Warren Skeeter, MD at bedside for eval.

## 2016-10-22 NOTE — ED Notes (Signed)
Pt informed writer that the it was ok to get the 2nd set of blood cultures from the picc line.

## 2016-10-22 NOTE — ED Provider Notes (Signed)
[facility] DEPT Provider Note   CSN: [id number] Arrival date & time: 10/22/16  1749     History   Chief Complaint Chief Complaint  Patient presents with  . Bleeding/Bruising  . Neutropenia    HPI Warren [name] is a 75 y.o. male.  HPI  75 year old male with a history of MDS currently on treatment presents with new petechiae. He states that today he noticed small spots on both hands. Has not seen any obvious ones on his feet. Denies any headache, fever, blurry vision, or any other new symptoms. He is currently on IV Ancef for MSSA. ID was called today and sent him to the ER for labs and blood cultures. ID note references that he may need to have his antibiotics changed.  No bleeding.  Past Medical History:  Diagnosis Date  . Cancer [facility])    Prostate, Melanoma- lt. Shoulder (no further problems since 2010  . CHF (congestive heart failure) ([place]) 09/13/2016  . Complication of anesthesia    versed gave the adverse reaction pt. ,became aggitated  . COPD (chronic obstructive pulmonary disease) (HCC)    Dr. Annamaria Boots follows  . Diffuse esophageal spasm   . Diverticulosis   . DJD (degenerative joint disease)   . Multiple trauma     horse accident 2003 L SHOULDER  . Oral herpes   . OSA (obstructive sleep apnea)    mild, rare cpap use  . Peripheral neuropathy ([place])   . Pulmonary nodule   . Spinal stenosis   . Systolic hypertension     Patient Active Problem List   Diagnosis Date Noted  . Acute diastolic heart failure ([place]) 10/13/2016  . Lung nodule   . Chest pain   . Pleural effusion, right   . Cough   . Staphylococcus aureus bacteremia with sepsis ([place])   . Chronic pain of left ankle   . Polyuria   . Bacteremia 09/16/2016  . Neutropenic fever ([place]) 09/15/2016  . CHF (congestive heart failure) ([place]) 09/13/2016  . Acute respiratory failure with hypoxia ([place]) 09/13/2016  . Myelodysplastic syndrome ([place]) 08/25/2016  . Ankle cellulitis 08/25/2016  . [place] 08/25/2016    . Mucositis due to antineoplastic therapy 08/03/2016  . Chills without fever 08/03/2016  . Skin rash 08/03/2016  . Iron deficiency anemia 04/18/2016  . RAEB-2 (refractory anemia with excess blasts-2) ([place]) 03/24/2016  . Leucopenia 03/24/2016  . Neutropenia ([place]) 03/18/2016  . Symptomatic anemia 03/18/2016  . Leukopenia 03/18/2016  . Pancytopenia ([place]) 03/18/2016  . S/P thoracentesis 03/18/2016  . Thrombocytopenia ([place]) 03/18/2016  . Neutropenia, unspecified 03/18/2016  . Dyspnea on exertion 03/09/2016  . Dyslipidemia 11/11/2014  . CAD (coronary artery disease), native coronary artery 11/11/2014  . Coronary artery calcification 10/10/2013  . Benign essential HTN   . Atherosclerosis of aorta ([place]) 09/12/2013  . Acute bronchitis 10/06/2010  . OSA (obstructive sleep apnea) 10/07/2008  . PULMONARY NODULE 10/07/2008    Past Surgical History:  Procedure Laterality Date  . ,laceration to head in accident with hourse    . ACOUSTIC NEUROMA LEFT EAR  1999  . BALLOON DILATION N/A 11/25/2014   Procedure: BALLOON DILATION;  Surgeon: Garlan Fair, MD;  Location: [facility];  Service: Endoscopy;  Laterality: N/A;  . COLONOSCOPY WITH PROPOFOL N/A 10/01/2013   Procedure: COLONOSCOPY WITH PROPOFOL;  Surgeon: Garlan Fair, MD;  Location: [facility];  Service: Endoscopy;  Laterality: N/A;  . ESOPHAGOGASTRODUODENOSCOPY (EGD) WITH PROPOFOL N/A 11/25/2014   Procedure: ESOPHAGOGASTRODUODENOSCOPY (EGD) WITH PROPOFOL;  Surgeon:  Garlan Fair, MD;  Location: [facility];  Service: Endoscopy;  Laterality: N/A;  . EYE SURGERY     cosmetic surgery"brow lift" 4'15  . MULTIPLE PROCEDURES FOR LEFT ARM     ELBOW,RIB FX PNEUMOTHORAX AFTER FALLING OFF MOUNTAIN  . PILONIDAL CYST / SINUS EXCISION    . RADICAL PROSTATECTOMY    . TEE WITHOUT CARDIOVERSION N/A 09/20/2016   Procedure: TRANSESOPHAGEAL ECHOCARDIOGRAM (TEE);  Surgeon: Jerline Pain, MD;  Location: [facility] ENDOSCOPY;  Service: Cardiovascular;   Laterality: N/A;       Home Medications    Prior to Admission medications   Medication Sig Start Date End Date Taking? Authorizing Provider  calcium carbonate (TUMS - DOSED IN MG ELEMENTAL CALCIUM) 500 MG chewable tablet Chew 1 tablet by mouth at bedtime.     Historical Provider, MD  chlorhexidine (PERIDEX) 0.12 % solution RINSE 10 ML (CC) IN MOUTH TWICE DAILY AS DIRECTED **SWISH  AND  SPIT,  DO  NOT  SWALLOW** 09/12/16   Brunetta Genera, MD  ciprofloxacin (CIPRO) 500 MG tablet Take 1 tablet (500 mg total) by mouth 2 (two) times daily. 10/13/16   Brunetta Genera, MD  furosemide (LASIX) 20 MG tablet Take 1 tablet (20 mg total) by mouth daily. 09/14/16   Janece Canterbury, MD  magic mouthwash SOLN Take 5 mLs by mouth 4 (four) times daily as needed for mouth pain. 08/03/16   Heath Lark, MD  Multiple Vitamin (MULTIVITAMIN WITH MINERALS) TABS tablet Take 1 tablet by mouth daily.    Historical Provider, MD  nitroGLYCERIN (NITROSTAT) 0.4 MG SL tablet Place 1 tablet (0.4 mg total) under the tongue every 5 (five) minutes as needed for chest pain. 11/11/14   Sueanne Margarita, MD  omeprazole (PRILOSEC) 40 MG capsule Take 40 mg by mouth daily before lunch. 1/2 hour before.    Historical Provider, MD  ondansetron (ZOFRAN) 4 MG tablet Take 4 mg by mouth every 8 (eight) hours as needed for nausea or vomiting.  05/07/16   Historical Provider, MD  posaconazole (NOXAFIL) 100 MG TBEC delayed-release tablet Take 3 tablets (300 mg total) by mouth daily. Patient taking differently: Take 300 mg by mouth at bedtime.  08/22/16   Brunetta Genera, MD  potassium chloride (K-DUR) 10 MEQ tablet Take 2 tablets (20 mEq total) by mouth daily. 09/14/16   Janece Canterbury, MD  senna-docusate (SENNA S) 8.6-50 MG tablet Take 2 tablets by mouth 2 (two) times daily as needed for mild constipation or moderate constipation. 09/14/16   Janece Canterbury, MD  valACYclovir (VALTREX) 500 MG tablet Take 1 tablet (500 mg total) by mouth 2  (two) times daily. At night to suppress herpes gingivitis 08/22/16   Brunetta Genera, MD  valsartan (DIOVAN) 80 MG tablet Take 40 mg by mouth at bedtime.     Historical Provider, MD    Family History Family History  Problem Relation Age of Onset  . Emphysema Father   . Asthma Father   . Lung cancer Father   . Breast cancer Sister   . Breast cancer Sister   . Skin cancer Brother   . Prostate cancer Brother     Social History Social History  Substance Use Topics  . Smoking status: Former Smoker    Packs/day: 1.00    Years: 40.00    Quit date: 11/29/1995  . Smokeless tobacco: Never Used  . Alcohol use Yes     Comment: 1 drink per day     Allergies  Fluconazole and Midazolam   Review of Systems Review of Systems  Constitutional: Negative for fever.  HENT: Negative for nosebleeds.   Gastrointestinal: Negative for blood in stool.  Skin: Positive for rash.  Neurological: Negative for headaches.  All other systems reviewed and are negative.    Physical Exam Updated Vital Signs BP 146/81 (BP Location: Left Arm)   Pulse 74   Temp 98.9 F (37.2 C)   Resp 18   SpO2 100%   Physical Exam  Constitutional: He is oriented to person, place, and time. He appears well-developed and well-nourished. No distress.  HENT:  Head: Normocephalic and atraumatic.  Right Ear: External ear normal.  Left Ear: External ear normal.  Nose: Nose normal.  Eyes: Right eye exhibits no discharge. Left eye exhibits no discharge.  Neck: Neck supple.  Cardiovascular: Normal rate, regular rhythm and normal heart sounds.   Pulmonary/Chest: Effort normal and breath sounds normal.  Abdominal: Soft. There is no tenderness.  Musculoskeletal: He exhibits no edema.  Neurological: He is alert and oriented to person, place, and time.  Skin: Skin is warm and dry. Rash noted. He is not diaphoretic.  Has scattered small areas c/w likely petechiae in both pink fingers palmar side and left thumb palmar  side. 1 dot that could be petechiae on left plantar foot  Nursing note and vitals reviewed.    ED Treatments / Results  Labs (all labs ordered are listed, but only abnormal results are displayed) Labs Reviewed  CBC WITH DIFFERENTIAL/PLATELET - Abnormal; Notable for the following:       Result Value   WBC 1.2 (*)    RBC 2.91 (*)    Hemoglobin 8.8 (*)    HCT 29.0 (*)    RDW 21.1 (*)    Platelets 69 (*)    Neutro Abs 0.1 (*)    Monocytes Absolute 0.0 (*)    All other components within normal limits  COMPREHENSIVE METABOLIC PANEL - Abnormal; Notable for the following:    Glucose, Bld 126 (*)    BUN 23 (*)    Calcium 8.5 (*)    Total Protein 6.3 (*)    AST 61 (*)    ALT 14 (*)    Total Bilirubin 1.5 (*)    All other components within normal limits  CULTURE, BLOOD (ROUTINE X 2)  CULTURE, BLOOD (ROUTINE X 2)  PROTIME-INR    EKG  EKG Interpretation None       Radiology No results found.  Procedures Procedures (including critical care time)  Medications Ordered in ED Medications - No data to display   Initial Impression / Assessment and Plan / ED Course  I have reviewed the triage vital signs and the nursing notes.  Pertinent labs & imaging results that were available during my care of the patient were reviewed by me and considered in my medical decision making (see chart for details).  Clinical Course as of Oct 22 2324  Sat Oct 22, 2016  1906 Labs, Blood cultures  [SG]    Clinical Course User Index [SG] Sherwood Gambler, MD    Patient has no complaints except for the non-raised rash he noticed. No fevers. D/w heme/onc, Dr. Irene Limbo, who knows patient. Given platelets stable since 5 days ago, f/u as outpatient. D/w ID, Dr. Tommy Medal, who will follow blood cultures and at this time also knows patient and agrees with discharge. Strict return precautions.   Final Clinical Impressions(s) / ED Diagnoses   Final diagnoses:  Rash  and nonspecific skin eruption    New  Prescriptions Discharge Medication List as of 10/22/2016  9:00 PM       Sherwood Gambler, MD 10/22/16 2326

## 2016-10-23 NOTE — Progress Notes (Signed)
Warren [name]    HEMATOLOGY/ONCOLOGY CLINIC NOTE  Date of Service: .10/10/2016  Patient Care Team: Wenda Low, MD as PCP - General (Internal Medicine) [name], NP as [place] Management [name], MD as Attending Physician (Oncology)  Dr [name] ([facility])  CHIEF COMPLAINTS: Follow-up for MDS  DIAGNOSIS RAEB-2 with about 15% blasts. Trisomy 8 (Intermediate risk) on FISH studies. R-ISS ( score 7- very high risk)  Current treatment Vidaza s/p 5 cycles Falling at [facility] with Dr. Ok Edwards and Dr. Royce Macadamia for evaluation regarding transplant and other treatment options.  INTERVAL HISTORY  Warren [name] is here for his scheduled follow-up for his very high risk MDS after his recent hospitalization for MSSA bacteremia with septic pulmonary emboli. He continues to be on IV cefazolin as per infectious disease. He unfortunately missed his appointment with Dr. Royce Macadamia who I did have a discussion with. A decision was made to hold off on additional Vidaza deliver some improvement in counts and after his scheduled appointment with Dr Ok Edwards. He'll impose that he is feeling better overall and fields and then less fatigued. We decided to restart the ciprofloxacin for neutropenic precautions. No other acute new symptoms. No fevers or chills.   MEDICAL HISTORY:  Past Medical History:  Diagnosis Date  . Cancer [facility])    Prostate, Melanoma- lt. Shoulder (no further problems since 2010  . CHF (congestive heart failure) ([place]) 09/13/2016  . Complication of anesthesia    versed gave the adverse reaction pt. ,became aggitated  . COPD (chronic obstructive pulmonary disease) (HCC)    Dr. Annamaria Boots follows  . Diffuse esophageal spasm   . Diverticulosis   . DJD (degenerative joint disease)   . Multiple trauma     horse accident 2003 L SHOULDER  . Oral herpes   . OSA (obstructive sleep apnea)    mild, rare cpap use  . Peripheral neuropathy ([place])   . Pulmonary nodule   . Spinal stenosis     . Systolic hypertension   Recurrent HSV related cold sores. Acoustic neuroma left 1999 - s/p surgery. Pruritus ani Injury in 2003 related to a horse accident resulting in hydrothorax, pneumothorax, fractured ribs, dislocated left shoulder, rotator cuff tear on the left, left elbow damage and left ulnar nerve damage.   SURGICAL HISTORY: Past Surgical History:  Procedure Laterality Date  . ,laceration to head in accident with hourse    . ACOUSTIC NEUROMA LEFT EAR  1999  . BALLOON DILATION N/A 11/25/2014   Procedure: BALLOON DILATION;  Surgeon: Garlan Fair, MD;  Location: [facility];  Service: Endoscopy;  Laterality: N/A;  . COLONOSCOPY WITH PROPOFOL N/A 10/01/2013   Procedure: COLONOSCOPY WITH PROPOFOL;  Surgeon: Garlan Fair, MD;  Location: [facility]COPY;  Service: Endoscopy;  Laterality: N/A;  . ESOPHAGOGASTRODUODENOSCOPY (EGD) WITH PROPOFOL N/A 11/25/2014   Procedure: ESOPHAGOGASTRODUODENOSCOPY (EGD) WITH PROPOFOL;  Surgeon: Garlan Fair, MD;  Location: [facility] ENDOSCOPY;  Service: Endoscopy;  Laterality: N/A;  . EYE SURGERY     cosmetic surgery"brow lift" 4'15  . MULTIPLE PROCEDURES FOR LEFT ARM     ELBOW,RIB FX PNEUMOTHORAX AFTER FALLING OFF MOUNTAIN  . PILONIDAL CYST / SINUS EXCISION    . RADICAL PROSTATECTOMY    . TEE WITHOUT CARDIOVERSION N/A 09/20/2016   Procedure: TRANSESOPHAGEAL ECHOCARDIOGRAM (TEE);  Surgeon: Jerline Pain, MD;  Location: [facility] ENDOSCOPY;  Service: Cardiovascular;  Laterality: N/A;    SOCIAL HISTORY: Social History   Social History  . Marital status: Married    Spouse  name: N/A  . Number of children: N/A  . Years of education: N/A   Occupational History  . Not on file.   Social History Main Topics  . Smoking status: Former Smoker    Packs/day: 1.00    Years: 40.00    Quit date: 11/29/1995  . Smokeless tobacco: Never Used  . Alcohol use Yes     Comment: 1 drink per day  . Drug use: No  . Sexual activity: Not on file   Other Topics  Concern  . Not on file   Social History Narrative   CPAP 8 advanced   Patient drinks 5 cups of caffeine daily   Exercises 3-4 times weekly   Son is a pulmonologist    FAMILY HISTORY: Family History  Problem Relation Age of Onset  . Emphysema Father   . Asthma Father   . Lung cancer Father   . Breast cancer Sister   . Breast cancer Sister   . Skin cancer Brother   . Prostate cancer Brother    Brother with a recent diagnosis of hemachromatosis Brother with CLL/SLL Mother had anemia, thalassemia. Hepatitis C with liver cancer  ALLERGIES:  is allergic to fluconazole and midazolam.  MEDICATIONS:  Current Outpatient Prescriptions  Medication Sig Dispense Refill  . calcium carbonate (TUMS - DOSED IN MG ELEMENTAL CALCIUM) 500 MG chewable tablet Chew 1 tablet by mouth at bedtime.     . chlorhexidine (PERIDEX) 0.12 % solution RINSE 10 ML (CC) IN MOUTH TWICE DAILY AS DIRECTED **SWISH  AND  SPIT,  DO  NOT  SWALLOW** 473 mL 1  . ciprofloxacin (CIPRO) 500 MG tablet Take 1 tablet (500 mg total) by mouth 2 (two) times daily. 60 tablet 1  . furosemide (LASIX) 20 MG tablet Take 1 tablet (20 mg total) by mouth daily. 30 tablet 0  . magic mouthwash SOLN Take 5 mLs by mouth 4 (four) times daily as needed for mouth pain. 480 mL 0  . Multiple Vitamin (MULTIVITAMIN WITH MINERALS) TABS tablet Take 1 tablet by mouth daily.    . nitroGLYCERIN (NITROSTAT) 0.4 MG SL tablet Place 1 tablet (0.4 mg total) under the tongue every 5 (five) minutes as needed for chest pain. 25 tablet 1  . omeprazole (PRILOSEC) 40 MG capsule Take 40 mg by mouth daily before lunch. 1/2 hour before.    . ondansetron (ZOFRAN) 4 MG tablet Take 4 mg by mouth every 8 (eight) hours as needed for nausea or vomiting.     . posaconazole (NOXAFIL) 100 MG TBEC delayed-release tablet Take 3 tablets (300 mg total) by mouth daily. (Patient taking differently: Take 300 mg by mouth at bedtime. ) 90 tablet 0  . potassium chloride (K-DUR) 10 MEQ  tablet Take 2 tablets (20 mEq total) by mouth daily. 60 tablet 0  . senna-docusate (SENNA S) 8.6-50 MG tablet Take 2 tablets by mouth 2 (two) times daily as needed for mild constipation or moderate constipation.    . valACYclovir (VALTREX) 500 MG tablet Take 1 tablet (500 mg total) by mouth 2 (two) times daily. At night to suppress herpes gingivitis 60 tablet 1  . valsartan (DIOVAN) 80 MG tablet Take 40 mg by mouth at bedtime.      No current facility-administered medications for this visit.     REVIEW OF SYSTEMS:    10 Point review of Systems was done is negative except as noted above.  PHYSICAL EXAMINATION: ECOG PERFORMANCE STATUS: 1 - Symptomatic but completely ambulatory  .  Vitals:   10/10/16 1508  BP: 138/84  Pulse: 69  Resp: 18  Temp: 97.8 F (36.6 C)   Filed Weights   10/10/16 1508  Weight: 159 lb 1.6 oz (72.2 kg)   .Body mass index is 24.92 kg/m.  GENERAL:alert, in no acute distress and comfortable SKIN: Resolving rash on his upper extremities EYES: normal, conjunctiva are pink and non-injected, sclera clear OROPHARYNX:no exudate, no erythema and lips, buccal mucosa, and tongue normal  NECK: supple, no JVD, thyroid normal size, non-tender, without nodularity LYMPH:  no palpable lymphadenopathy in the cervical, axillary or inguinal LUNGS: clear to auscultation With somewhat decreased entry bilaterally, no wheezing HEART: regular rate & rhythm,  no murmurs and no lower extremity edema ABDOMEN: abdomen soft, non-tender, normoactive bowel sounds , no palpable hepatosplenomegaly Musculoskeletal: no cyanosis of digits. PSYCH: alert & oriented x 3 with fluent speech NEURO: no focal motor deficits.  LABORATORY DATA:  I have reviewed the data as listed  . CBC Latest Ref Rng & Units 10/10/2016  WBC 4.0 - 10.5 K/uL 1.0(L)  Hemoglobin 13.0 - 17.0 g/dL 9.2(L)  Hematocrit 39.0 - 52.0 % 31.0(L)  Platelets 150 - 400 K/uL 135(L)    . CMP Latest Ref Rng & Units  10/10/2016 10/03/2016  Glucose 65 - 99 mg/dL 104 114  BUN 6 - 20 mg/dL 17.9 14.8  Creatinine 0.61 - 1.24 mg/dL 1.0 1.0  Sodium 135 - 145 mmol/L 139 142  Potassium 3.5 - 5.1 mmol/L 4.0 3.7  Chloride 101 - 111 mmol/L - -  CO2 22 - 32 mmol/L 25 27  Calcium 8.9 - 10.3 mg/dL 8.5 8.2(L)  Total Protein 6.5 - 8.1 g/dL 6.5 6.7  Total Bilirubin 0.3 - 1.2 mg/dL 1.31(H) 1.24(H)  Alkaline Phos 38 - 126 U/L 88 91  AST 15 - 41 U/L 41(H) 45(H)  ALT 17 - 63 U/L 10 11   ROSS AND MICROSCOPIC INFORMATION RADIOGRAPHIC STUDIES: I have personally reviewed the radiological images as listed and agreed with the findings in the report. Dg Chest 2 View  Result Date: 10/13/2016 CLINICAL DATA:  Right pleural effusion. EXAM: CHEST  2 VIEW COMPARISON:  09/29/2016 FINDINGS: Interval improvement in bilateral pleural effusions which are now minimal. Improved aeration of the lung bases which are now nearly clear. Negative for pneumothorax. PICC tip in the lower SVC. Cardiac enlargement without heart failure or edema. IMPRESSION: Improving pleural effusions bilaterally. Improved aeration in the lung bases. No pneumothorax. Electronically Signed   By: Franchot Gallo M.D.   On: 10/13/2016 15:07   Dg Chest 2 View  Result Date: 09/29/2016 CLINICAL DATA:  Dyspnea x 6 mo. Recent hospitalization for bilateral pleural effusion/thoracentesis. Hx of COPD, asthma, HTN. Ex smoker. EXAM: CHEST  2 VIEW COMPARISON:  09/26/2016 FINDINGS: Right-sided PICC line tip overlies the level of superior vena cava. Heart is normal in size. There are bilateral pleural effusions. Patchy densities are identified in the lung bases ended increased slightly. IMPRESSION: Increased bibasilar opacities and small effusions. Electronically Signed   By: Nolon Nations M.D.   On: 09/29/2016 15:10   Dg Chest 2 View  Result Date: 09/26/2016 CLINICAL DATA:  Pleural effusion. Shortness of breath. Congestive heart failure. Neutropenic fever. Sepsis. Myelodysplastic  syndrome. EXAM: CHEST  2 VIEW COMPARISON:  09/24/2016 FINDINGS: The heart size and mediastinal contours are within normal limits. Right arm PICC line remains in stable position. Mild to moderate diffuse interstitial infiltrates show no significant change. Tiny bilateral pleural effusions are also unchanged. No evidence  of pulmonary consolidation or pneumothorax. Aortic atherosclerosis. IMPRESSION: No significant change in diffuse interstitial infiltrates and tiny bilateral pleural effusions. This is suspicious for pulmonary edema. Atypical infection considered less likely but cannot be excluded. Electronically Signed   By: Earle Gell M.D.   On: 09/26/2016 11:50   Dg Chest Port 1 View  Result Date: 09/24/2016 CLINICAL DATA:  Pleural effusion, CHF, COPD EXAM: PORTABLE CHEST 1 VIEW COMPARISON:  09/23/2016 FINDINGS: Right-sided PICC line with the tip projecting over the SVC. Trace bilateral pleural effusions. Bilateral diffuse interstitial thickening. No pneumothorax. Stable cardiomediastinal silhouette. No acute osseous abnormality. IMPRESSION: 1. Bilateral interstitial airspace disease consistent concerning for infectious etiology. Trace bilateral pleural effusions. Electronically Signed   By: Kathreen Devoid   On: 09/24/2016 09:01    ASSESSMENT & PLAN:   75 year old Caucasian male with  1) Myelodysplastic syndrome (RAEB-2 with about 14-15% blasts) ISS-R score of 7 (Very High Risk) Cytogenetics show trisomy 8 (intermediate risk ) Presented with Subacute Anemia (with high normal MCV) + leukopenia/Neutropenia - new since September 2016. Patient has normal platelet counts at this time. Foundation one results reviewed patient has TET2, ASXL1, BCOR RUNX1 SETBP1 SRSF2 and STAG2 mutations identified. Repeat after his bone marrow suggested possibly some decrease in his blasts counts 7% by flow cytometry.  2) Anemia -  hemoglobin is relatively stable after his recent transfusion. No indication for PRBC  transfusion today. 3) Neutropenia . Patient continues to have an [place] of 100-200 range with 60-70% bone marrow cellularity arguing against Vidaza effect. This appears to be primarily related to his RAEB 2. Was on antimicrobial prophylaxis with ciprofloxacin.  3) recent hospitalization with fevers - noted to have MSSA bacteremia with septic emboli to the lung. No evidence of infective endocarditis on TEE. On IV cefazolin for 4-8 weeks as per infectious disease. PLAN -Hemoglobin and platelet counts are stable today with no indication for additional PRBC transfusions. -Transfuse PRBCs (CMV neg and irradiated) when necessary for symptomatic anemia or if hemoglobin less than 7. We will use it irradiated and CMV negative products only. -He will continue to be on his IV cefazolin and follow-up with infectious disease for lab monitoring on antibiotics and improved hepatic adjustments. -We shall continue his ciprofloxacin for neutropenic prophylaxis at this time. -Continue posaconazole for antifungal prophylaxis -In discussions with Dr. Royce Macadamia we should hold off on additional Vidaza for atleast 2 weeks more to allow his infection to get under control and and until additional decision making results  from his follow-up visit with Dr. Ok Edwards as it pertains to possibility of transplant . - Dr. Royce Macadamia did not suggest proceeding with any of the specific clinical trials available at [facility] at this time  --We shall see him back in 2 weeks. -Ongoing goals of care discussion.  4) Slightly elevated bilirubin level even before treatment (?GIlbert's) - stable  We'll continue to follow the patient every 2 weeks and have labs every week . Patient is agreeable to this plan of care.  I spent 25 minutes counseling the patient face to face. The total time spent in the appointment was 30 minutes and more than 50% was on counseling and direct patient cares.    Sullivan Lone MD [place] AAHIVMS [facility] Hematology/Oncology  Physician [facility]  (Office):       [phone] (Work cell):  [phone] (Fax):           [phone]

## 2016-10-24 ENCOUNTER — Other Ambulatory Visit: Payer: Self-pay | Admitting: *Deleted

## 2016-10-24 ENCOUNTER — Other Ambulatory Visit (HOSPITAL_BASED_OUTPATIENT_CLINIC_OR_DEPARTMENT_OTHER): Payer: Medicare Other

## 2016-10-24 ENCOUNTER — Ambulatory Visit: Payer: Medicare Other

## 2016-10-24 ENCOUNTER — Encounter: Payer: Self-pay | Admitting: Internal Medicine

## 2016-10-24 ENCOUNTER — Ambulatory Visit (INDEPENDENT_AMBULATORY_CARE_PROVIDER_SITE_OTHER): Payer: Medicare Other | Admitting: Internal Medicine

## 2016-10-24 VITALS — BP 155/86 | HR 74 | Temp 98.6°F | Wt 160.0 lb

## 2016-10-24 DIAGNOSIS — Z005 Encounter for examination of potential donor of organ and tissue: Secondary | ICD-10-CM | POA: Diagnosis not present

## 2016-10-24 DIAGNOSIS — D709 Neutropenia, unspecified: Secondary | ICD-10-CM | POA: Diagnosis not present

## 2016-10-24 DIAGNOSIS — I2510 Atherosclerotic heart disease of native coronary artery without angina pectoris: Secondary | ICD-10-CM | POA: Diagnosis not present

## 2016-10-24 DIAGNOSIS — D4622 Refractory anemia with excess of blasts 2: Secondary | ICD-10-CM

## 2016-10-24 DIAGNOSIS — D469 Myelodysplastic syndrome, unspecified: Secondary | ICD-10-CM | POA: Diagnosis not present

## 2016-10-24 DIAGNOSIS — I110 Hypertensive heart disease with heart failure: Secondary | ICD-10-CM | POA: Diagnosis not present

## 2016-10-24 DIAGNOSIS — M48061 Spinal stenosis, lumbar region without neurogenic claudication: Secondary | ICD-10-CM | POA: Diagnosis not present

## 2016-10-24 DIAGNOSIS — A4101 Sepsis due to Methicillin susceptible Staphylococcus aureus: Secondary | ICD-10-CM

## 2016-10-24 DIAGNOSIS — J90 Pleural effusion, not elsewhere classified: Secondary | ICD-10-CM

## 2016-10-24 DIAGNOSIS — D696 Thrombocytopenia, unspecified: Secondary | ICD-10-CM

## 2016-10-24 DIAGNOSIS — D509 Iron deficiency anemia, unspecified: Secondary | ICD-10-CM

## 2016-10-24 DIAGNOSIS — I5030 Unspecified diastolic (congestive) heart failure: Secondary | ICD-10-CM | POA: Diagnosis not present

## 2016-10-24 DIAGNOSIS — R5081 Fever presenting with conditions classified elsewhere: Secondary | ICD-10-CM

## 2016-10-24 DIAGNOSIS — D571 Sickle-cell disease without crisis: Secondary | ICD-10-CM | POA: Diagnosis not present

## 2016-10-24 DIAGNOSIS — A4901 Methicillin susceptible Staphylococcus aureus infection, unspecified site: Secondary | ICD-10-CM | POA: Diagnosis not present

## 2016-10-24 DIAGNOSIS — I509 Heart failure, unspecified: Secondary | ICD-10-CM | POA: Diagnosis not present

## 2016-10-24 LAB — COMPREHENSIVE METABOLIC PANEL
ALBUMIN: 3.2 g/dL — AB (ref 3.5–5.0)
ALK PHOS: 76 U/L (ref 40–150)
ALT: 18 U/L (ref 0–55)
AST: 72 U/L — AB (ref 5–34)
Anion Gap: 8 mEq/L (ref 3–11)
BUN: 17.5 mg/dL (ref 7.0–26.0)
CALCIUM: 8.6 mg/dL (ref 8.4–10.4)
CO2: 27 mEq/L (ref 22–29)
CREATININE: 0.9 mg/dL (ref 0.7–1.3)
Chloride: 106 mEq/L (ref 98–109)
EGFR: 81 mL/min/{1.73_m2} — ABNORMAL LOW (ref >90)
GLUCOSE: 127 mg/dL (ref 70–140)
Potassium: 3.7 mEq/L (ref 3.5–5.1)
Sodium: 140 mEq/L (ref 136–145)
TOTAL PROTEIN: 6.4 g/dL (ref 6.4–8.3)
Total Bilirubin: 1.64 mg/dL — ABNORMAL HIGH (ref 0.20–1.20)

## 2016-10-24 LAB — CBC & DIFF AND RETIC
BASO%: 0 % (ref 0.0–2.0)
BASOS ABS: 0 10*3/uL (ref 0.0–0.1)
EOS ABS: 0 10*3/uL (ref 0.0–0.5)
EOS%: 2.3 % (ref 0.0–7.0)
HEMATOCRIT: 29 % — AB (ref 38.4–49.9)
HEMOGLOBIN: 8.5 g/dL — AB (ref 13.0–17.1)
IMMATURE RETIC FRACT: 21.9 % — AB (ref 3.00–10.60)
LYMPH#: 0.8 10*3/uL — AB (ref 0.9–3.3)
LYMPH%: 89.8 % — ABNORMAL HIGH (ref 14.0–49.0)
MCH: 29.1 pg (ref 27.2–33.4)
MCHC: 29.3 g/dL — ABNORMAL LOW (ref 32.0–36.0)
MCV: 99.3 fL — ABNORMAL HIGH (ref 79.3–98.0)
MONO#: 0 10*3/uL — AB (ref 0.1–0.9)
MONO%: 2.3 % (ref 0.0–14.0)
NEUT#: 0.1 10*3/uL — CL (ref 1.5–6.5)
NEUT%: 5.6 % — ABNORMAL LOW (ref 39.0–75.0)
Platelets: 63 10*3/uL — ABNORMAL LOW (ref 140–400)
RBC: 2.92 10*6/uL — ABNORMAL LOW (ref 4.20–5.82)
RDW: 21.9 % — AB (ref 11.0–14.6)
RETIC %: 2 % — AB (ref 0.80–1.80)
RETIC CT ABS: 58.4 10*3/uL (ref 34.80–93.90)
WBC: 0.9 10*3/uL — CL (ref 4.0–10.3)

## 2016-10-24 LAB — TECHNOLOGIST REVIEW

## 2016-10-24 MED ORDER — HEPARIN SOD (PORK) LOCK FLUSH 100 UNIT/ML IV SOLN
250.0000 [IU] | Freq: Once | INTRAVENOUS | Status: AC
  Administered 2016-10-24: 14:00:00
  Filled 2016-10-24: qty 5

## 2016-10-24 MED ORDER — SODIUM CHLORIDE 0.9% FLUSH
10.0000 mL | Freq: Once | INTRAVENOUS | Status: AC
  Administered 2016-10-24: 10 mL
  Filled 2016-10-24: qty 10

## 2016-10-24 NOTE — Assessment & Plan Note (Signed)
He is out of lasix which was started in the hospital.  He is going to try without it and if he develops any new symptoms of SOB, leg swelling, he will call his PCP.

## 2016-10-24 NOTE — Assessment & Plan Note (Signed)
New and unclear etiology.  There is nothing to suspect this is related to infection, though will continue to watch blood cultures.

## 2016-10-24 NOTE — Patient Outreach (Signed)
Transition of care call. Pt has to go to the ED as he developed a rash that could have been problematic with his underlying neurtopenia. He was seen and evaluated and released. He will see cardiology this week and pulmonology. If all checks out well he will continue on his plan for  Stem cell treatment at [facility].   He knows to call if he has questions or problems.  I will call him again next week.  [name] [facility] [place] [phone]

## 2016-10-24 NOTE — Progress Notes (Signed)
   Subjective:    Patient ID: Warren [name], male    DOB: May 29, 1941, 75 y.o.   MRN: [id number]  HPI Here for hsfu  KOBAIN [name] is a 75 y.o. male with MDS followed by hematology here and at [facility] with consideration of stem cell transplant who presented in October with fever and chills that started on 10/18.  He had been hospitalized with pulmonary edema and edema, sob and has been on lasix and discharged but returned with above symptoms.  He was not having fever or chills at that time. Blood cultures sent and became positive 2/2 with MSSA by BCID. No lines, no port.  He was started on cefazolin and CXR and CT noted bilateral pleural effusions and multiple small pulmonary nodules, suspected to be septic emboli.  TEE was negative for vegetation and he continued on cefazolin for a projected 4-6 week course, pending resolution of his nodules and effusions.      He has tolerated the treatment well and since discharge he has been seen at [facility] with Infectious Diseases there, Dr. Hardie Lora, who also recommended continuing for 6 weeks minimum pending resolution of CXR and clinical improvement.  He is now in week 6 and had a repeat CXR about 12 days ago with much improved findings and another CXR pending for tomorrow.     Over the weekend, he noted some skin findings concerning for petechiae and recently noted his platelets decreased to 69,000 when they previously had been and came to the ED.  Repeat CBC noted similar platelets at 69,000, but no decrease and sent home.  Blood cultures also resent and ngtd.  He though feels well, good exercise capacity and improving, no fever, no chills and breathing good.     Review of Systems  Constitutional: Negative for chills, fatigue and fever.  Gastrointestinal: Negative for diarrhea.  Skin: Positive for rash.       Objective:   Physical Exam  Constitutional: He appears well-developed and well-nourished.  Eyes: No scleral icterus.  Cardiovascular: Normal rate,  regular rhythm and normal heart sounds.   Pulmonary/Chest: Effort normal and breath sounds normal. No respiratory distress.  Lymphadenopathy:    He has no cervical adenopathy.  Skin:  + very small but multiple areas of petechiae on hands, non-palpable.    Psychiatric: He has a normal mood and affect.   Social History   Social History  . Marital status: Married    Spouse name: N/A  . Number of children: N/A  . Years of education: N/A   Occupational History  . Not on file.   Social History Main Topics  . Smoking status: Former Smoker    Packs/day: 1.00    Years: 40.00    Quit date: 11/29/1995  . Smokeless tobacco: Never Used  . Alcohol use Yes     Comment: 1 drink per day  . Drug use: No  . Sexual activity: Not on file   Other Topics Concern  . Not on file   Social History Narrative   CPAP 8 advanced   Patient drinks 5 cups of caffeine daily   Exercises 3-4 times weekly   Son is a pulmonologist         Assessment & Plan:

## 2016-10-24 NOTE — Assessment & Plan Note (Signed)
Resolved fever but still significantly neutropenic.  Will continue with doxycycline once cefazolin stopped.

## 2016-10-24 NOTE — Assessment & Plan Note (Addendum)
Resolving.  Repeat blood cultures sent but negative so far.   Will repeat a TTE with new petechiae to be sure no suggestion of IE.    ADDENDUM 10/27/16: surveillance blood cultures negative to date, repeat TTE without vegetation, CXR clear with resolution of effusions.  He will complete his 6 weeks of IV cefazolin tomorrow and transition to oral doxycycline 100 mg bid. Duration dependent on neutropenia/transplant.

## 2016-10-24 NOTE — Assessment & Plan Note (Signed)
Resolving.  Will repeat CXR tomorrow and if ok, will stop IV antibiotics if no other concerns.

## 2016-10-25 ENCOUNTER — Ambulatory Visit (INDEPENDENT_AMBULATORY_CARE_PROVIDER_SITE_OTHER): Payer: Medicare Other | Admitting: Pulmonary Disease

## 2016-10-25 ENCOUNTER — Ambulatory Visit (INDEPENDENT_AMBULATORY_CARE_PROVIDER_SITE_OTHER)
Admission: RE | Admit: 2016-10-25 | Discharge: 2016-10-25 | Disposition: A | Discharge status: Home / Self Care | Payer: Medicare Other | Source: Ambulatory Visit | Attending: Pulmonary Disease | Admitting: Pulmonary Disease

## 2016-10-25 ENCOUNTER — Encounter: Payer: Self-pay | Admitting: Pulmonary Disease

## 2016-10-25 VITALS — BP 126/72 | HR 69 | Temp 98.1°F | Ht 67.0 in | Wt 158.8 lb

## 2016-10-25 DIAGNOSIS — J90 Pleural effusion, not elsewhere classified: Secondary | ICD-10-CM

## 2016-10-25 DIAGNOSIS — I2510 Atherosclerotic heart disease of native coronary artery without angina pectoris: Secondary | ICD-10-CM

## 2016-10-25 NOTE — Patient Instructions (Signed)
We will call you with the results of the chest x-ray Follow-up with [place] in April 2018 or sooner if needed

## 2016-10-25 NOTE — Assessment & Plan Note (Signed)
Agree with current plan to monitor blood cultures again as coordinated by Dr. Linus Salmons. Blood cultures as of today are negative, 3 days of growth.

## 2016-10-25 NOTE — Progress Notes (Signed)
Subjective:    Patient ID: Warren [name], male    DOB: [birth year], 75 y.o.   MRN: [id number]  Synopsis: Former patient of Dr. Annamaria Boots for mild COPD who developed myelodysplastic syndrome. Evaluated by [place] [facility] while hospitalized at [facility] hospital in October 2017 for staph bacteremia. During that hospitalization he had bilateral pleural effusions.  HPI Chief Complaint  Patient presents with  . Follow-up    pt states he is doing well, no complaints.  uses incentive spirometer daily.      Warren [name] is here to see me for f/u on his pleural effusion.  He feels that his breathing has been OK lately.  His hemoglobin has been stable lately.  He is back to hiking 2 miles at a time.  He is using the incentive spirometry all the way up.  No cough.  He has had some mild stuffiness in his nose.    Past Medical History:  Diagnosis Date  . Cancer [facility])    Prostate, Melanoma- lt. Shoulder (no further problems since 2010  . CHF (congestive heart failure) ([place]) 09/13/2016  . Complication of anesthesia    versed gave the adverse reaction pt. ,became aggitated  . COPD (chronic obstructive pulmonary disease) (HCC)    Dr. Annamaria Boots follows  . Diffuse esophageal spasm   . Diverticulosis   . DJD (degenerative joint disease)   . Multiple trauma     horse accident 2003 L SHOULDER  . Oral herpes   . OSA (obstructive sleep apnea)    mild, rare cpap use  . Peripheral neuropathy ([place])   . Pulmonary nodule   . Spinal stenosis   . Systolic hypertension       Review of Systems  Constitutional: Negative for diaphoresis, fatigue and fever.  HENT: Negative for rhinorrhea, sinus pain and sinus pressure.   Respiratory: Negative for choking, shortness of breath and wheezing.   Cardiovascular: Negative for chest pain, palpitations and leg swelling.       Objective:   Physical Exam  Vitals:   10/25/16 1105  BP: 126/72  Pulse: 69  Temp: 98.1 F (36.7 C)  TempSrc: Oral  SpO2: 98%    Weight: 158 lb 12.8 oz (72 kg)  Height: 5\' 7"  (1.702 m)   RA  Gen: well appearing HENT: OP clear, TM's clear, neck supple PULM: CTA B, normal percussion CV: RRR, no mgr, trace edema GI: BS+, soft, nontender Derm: petechiae over fingers (palmar aspect) Psyche: normal mood and affect   Records from infectious diseases from yesterday reviewed where they make plans to stop his antibiotics.    Assessment & Plan:  Pleural effusion, right His 10/13/2016 chest x-ray (images which I have personally reviewed) showed no evidence of pleural effusion. Further, today on exam he has no evidence of a pleural effusion. I suspect that this was a reactionary process in the setting of his bacteremia with some degree of volume overload contributing.  This seems to have resolved completely.  Plan: Repeat chest x-ray today to ensure there is no evidence of a pleural effusion Follow-up with me in the spring  Staphylococcus aureus bacteremia with sepsis [facility]) Agree with current plan to monitor blood cultures again as coordinated by Dr. Linus Salmons. Blood cultures as of today are negative, 3 days of growth.    Current Outpatient Prescriptions:  .  calcium carbonate (TUMS - DOSED IN MG ELEMENTAL CALCIUM) 500 MG chewable tablet, Chew 1 tablet by mouth at bedtime. , Disp: , Rfl:  .  chlorhexidine (PERIDEX) 0.12 % solution, RINSE 10 ML (CC) IN MOUTH TWICE DAILY AS DIRECTED **SWISH  AND  SPIT,  DO  NOT  SWALLOW**, Disp: 473 mL, Rfl: 1 .  ciprofloxacin (CIPRO) 500 MG tablet, Take 1 tablet (500 mg total) by mouth 2 (two) times daily., Disp: 60 tablet, Rfl: 1 .  magic mouthwash SOLN, Take 5 mLs by mouth 4 (four) times daily as needed for mouth pain., Disp: 480 mL, Rfl: 0 .  Multiple Vitamin (MULTIVITAMIN WITH MINERALS) TABS tablet, Take 1 tablet by mouth daily., Disp: , Rfl:  .  nitroGLYCERIN (NITROSTAT) 0.4 MG SL tablet, Place 1 tablet (0.4 mg total) under the tongue every 5 (five) minutes as needed for chest pain.,  Disp: 25 tablet, Rfl: 1 .  omeprazole (PRILOSEC) 40 MG capsule, Take 40 mg by mouth daily before lunch. 1/2 hour before., Disp: , Rfl:  .  ondansetron (ZOFRAN) 4 MG tablet, Take 4 mg by mouth every 8 (eight) hours as needed for nausea or vomiting. , Disp: , Rfl:  .  posaconazole (NOXAFIL) 100 MG TBEC delayed-release tablet, Take 3 tablets (300 mg total) by mouth daily. (Patient taking differently: Take 300 mg by mouth at bedtime. ), Disp: 90 tablet, Rfl: 0 .  senna-docusate (SENNA S) 8.6-50 MG tablet, Take 2 tablets by mouth 2 (two) times daily as needed for mild constipation or moderate constipation., Disp: , Rfl:  .  valACYclovir (VALTREX) 500 MG tablet, Take 1 tablet (500 mg total) by mouth 2 (two) times daily. At night to suppress herpes gingivitis, Disp: 60 tablet, Rfl: 1 .  valsartan (DIOVAN) 80 MG tablet, Take 40 mg by mouth at bedtime. , Disp: , Rfl:

## 2016-10-25 NOTE — Assessment & Plan Note (Signed)
His 10/13/2016 chest x-ray (images which I have personally reviewed) showed no evidence of pleural effusion. Further, today on exam he has no evidence of a pleural effusion. I suspect that this was a reactionary process in the setting of his bacteremia with some degree of volume overload contributing.  This seems to have resolved completely.  Plan: Repeat chest x-ray today to ensure there is no evidence of a pleural effusion Follow-up with me in the spring

## 2016-10-26 ENCOUNTER — Ambulatory Visit (HOSPITAL_COMMUNITY)
Admission: RE | Admit: 2016-10-26 | Discharge: 2016-10-26 | Disposition: A | Discharge status: Home / Self Care | Payer: Medicare Other | Source: Ambulatory Visit | Attending: Internal Medicine | Admitting: Internal Medicine

## 2016-10-26 ENCOUNTER — Other Ambulatory Visit: Payer: Self-pay | Admitting: Internal Medicine

## 2016-10-26 DIAGNOSIS — D469 Myelodysplastic syndrome, unspecified: Secondary | ICD-10-CM

## 2016-10-26 DIAGNOSIS — J449 Chronic obstructive pulmonary disease, unspecified: Secondary | ICD-10-CM | POA: Insufficient documentation

## 2016-10-26 DIAGNOSIS — I5030 Unspecified diastolic (congestive) heart failure: Secondary | ICD-10-CM | POA: Diagnosis not present

## 2016-10-26 DIAGNOSIS — I110 Hypertensive heart disease with heart failure: Secondary | ICD-10-CM | POA: Diagnosis not present

## 2016-10-26 DIAGNOSIS — I313 Pericardial effusion (noninflammatory): Secondary | ICD-10-CM | POA: Diagnosis not present

## 2016-10-26 DIAGNOSIS — R7881 Bacteremia: Secondary | ICD-10-CM | POA: Diagnosis present

## 2016-10-26 NOTE — Progress Notes (Signed)
  Echocardiogram 2D Echocardiogram limited has been performed.  Jennette Dubin 10/26/2016, 11:43 AM

## 2016-10-27 ENCOUNTER — Telehealth: Payer: Self-pay | Admitting: *Deleted

## 2016-10-27 ENCOUNTER — Other Ambulatory Visit: Payer: Self-pay | Admitting: Internal Medicine

## 2016-10-27 MED ORDER — DOXYCYCLINE HYCLATE 100 MG PO CAPS: 100.0000 mg | ORAL_CAPSULE | Freq: Two times a day (BID) | ORAL | 11 refills | Status: DC

## 2016-10-27 NOTE — Telephone Encounter (Signed)
Relayed information to patient. He verbalized understanding and agreement.  He will pick up the doxy and start it after he completes the iv antibiotics. Verbal order per Dr Linus Salmons to pull PICC at end of antibiotics given to [facility] at [facility], repeated and verified. Landis Gandy, RN

## 2016-10-27 NOTE — Telephone Encounter (Signed)
-----   Message from Thayer Headings, MD sent at 10/27/2016  9:09 AM EST ----- Please let him know that his echo, CXR and blood cultures are all good/negative and we can proceed with the plan of stopping the IV antibiotics tomorrow (can complete tomorrow) and then start doxycycline 100 mg bid (sent).   Let advanced know and to pull picc line. thanks

## 2016-10-28 LAB — CULTURE, BLOOD (ROUTINE X 2)
CULTURE: NO GROWTH
Culture: NO GROWTH

## 2016-10-31 ENCOUNTER — Other Ambulatory Visit (HOSPITAL_BASED_OUTPATIENT_CLINIC_OR_DEPARTMENT_OTHER): Payer: Medicare Other

## 2016-10-31 ENCOUNTER — Other Ambulatory Visit: Payer: Self-pay

## 2016-10-31 ENCOUNTER — Other Ambulatory Visit: Payer: Self-pay | Admitting: *Deleted

## 2016-10-31 ENCOUNTER — Ambulatory Visit (HOSPITAL_BASED_OUTPATIENT_CLINIC_OR_DEPARTMENT_OTHER): Payer: Medicare Other | Admitting: Hematology

## 2016-10-31 ENCOUNTER — Ambulatory Visit (HOSPITAL_BASED_OUTPATIENT_CLINIC_OR_DEPARTMENT_OTHER): Payer: Medicare Other

## 2016-10-31 VITALS — BP 154/77 | HR 65 | Temp 98.3°F | Resp 18 | Ht 67.0 in | Wt 160.6 lb

## 2016-10-31 DIAGNOSIS — M48061 Spinal stenosis, lumbar region without neurogenic claudication: Secondary | ICD-10-CM | POA: Diagnosis not present

## 2016-10-31 DIAGNOSIS — D61818 Other pancytopenia: Secondary | ICD-10-CM

## 2016-10-31 DIAGNOSIS — D649 Anemia, unspecified: Secondary | ICD-10-CM | POA: Diagnosis not present

## 2016-10-31 DIAGNOSIS — D571 Sickle-cell disease without crisis: Secondary | ICD-10-CM | POA: Diagnosis not present

## 2016-10-31 DIAGNOSIS — D709 Neutropenia, unspecified: Secondary | ICD-10-CM | POA: Diagnosis not present

## 2016-10-31 DIAGNOSIS — A4101 Sepsis due to Methicillin susceptible Staphylococcus aureus: Secondary | ICD-10-CM | POA: Diagnosis not present

## 2016-10-31 DIAGNOSIS — D4622 Refractory anemia with excess of blasts 2: Secondary | ICD-10-CM

## 2016-10-31 DIAGNOSIS — D469 Myelodysplastic syndrome, unspecified: Secondary | ICD-10-CM

## 2016-10-31 DIAGNOSIS — Z5111 Encounter for antineoplastic chemotherapy: Secondary | ICD-10-CM

## 2016-10-31 DIAGNOSIS — D696 Thrombocytopenia, unspecified: Secondary | ICD-10-CM | POA: Diagnosis not present

## 2016-10-31 DIAGNOSIS — I110 Hypertensive heart disease with heart failure: Secondary | ICD-10-CM | POA: Diagnosis not present

## 2016-10-31 DIAGNOSIS — A4901 Methicillin susceptible Staphylococcus aureus infection, unspecified site: Secondary | ICD-10-CM | POA: Diagnosis not present

## 2016-10-31 DIAGNOSIS — I509 Heart failure, unspecified: Secondary | ICD-10-CM | POA: Diagnosis not present

## 2016-10-31 LAB — COMPREHENSIVE METABOLIC PANEL
ALT: 19 U/L (ref 0–55)
AST: 57 U/L — AB (ref 5–34)
Albumin: 3.3 g/dL — ABNORMAL LOW (ref 3.5–5.0)
Alkaline Phosphatase: 79 U/L (ref 40–150)
Anion Gap: 8 mEq/L (ref 3–11)
BUN: 15.3 mg/dL (ref 7.0–26.0)
CALCIUM: 8.8 mg/dL (ref 8.4–10.4)
CHLORIDE: 108 meq/L (ref 98–109)
CO2: 24 meq/L (ref 22–29)
CREATININE: 0.9 mg/dL (ref 0.7–1.3)
EGFR: 83 mL/min/{1.73_m2} — ABNORMAL LOW (ref >90)
Glucose: 98 mg/dl (ref 70–140)
Potassium: 4 mEq/L (ref 3.5–5.1)
Sodium: 140 mEq/L (ref 136–145)
Total Bilirubin: 1.96 mg/dL — ABNORMAL HIGH (ref 0.20–1.20)
Total Protein: 6.5 g/dL (ref 6.4–8.3)

## 2016-10-31 LAB — CBC & DIFF AND RETIC
BASO%: 0 % (ref 0.0–2.0)
BASOS ABS: 0 10*3/uL (ref 0.0–0.1)
EOS%: 2.2 % (ref 0.0–7.0)
Eosinophils Absolute: 0 10*3/uL (ref 0.0–0.5)
HEMATOCRIT: 28.5 % — AB (ref 38.4–49.9)
HGB: 8.6 g/dL — ABNORMAL LOW (ref 13.0–17.1)
Immature Retic Fract: 20.4 % — ABNORMAL HIGH (ref 3.00–10.60)
LYMPH#: 0.9 10*3/uL (ref 0.9–3.3)
LYMPH%: 92.5 % — ABNORMAL HIGH (ref 14.0–49.0)
MCH: 29.9 pg (ref 27.2–33.4)
MCHC: 30.2 g/dL — AB (ref 32.0–36.0)
MCV: 99 fL — ABNORMAL HIGH (ref 79.3–98.0)
MONO#: 0 10*3/uL — AB (ref 0.1–0.9)
MONO%: 2.2 % (ref 0.0–14.0)
NEUT#: 0 10*3/uL — CL (ref 1.5–6.5)
NEUT%: 3.1 % — ABNORMAL LOW (ref 39.0–75.0)
Platelets: 50 10*3/uL — ABNORMAL LOW (ref 140–400)
RBC: 2.88 10*6/uL — AB (ref 4.20–5.82)
RDW: 21.6 % — AB (ref 11.0–14.6)
RETIC %: 1.75 % (ref 0.80–1.80)
RETIC CT ABS: 50.4 10*3/uL (ref 34.80–93.90)
WBC: 0.9 10*3/uL — CL (ref 4.0–10.3)

## 2016-10-31 LAB — LACTATE DEHYDROGENASE: LDH: 291 U/L — ABNORMAL HIGH (ref 125–245)

## 2016-10-31 MED ORDER — ONDANSETRON HCL 8 MG PO TABS
ORAL_TABLET | ORAL | Status: AC
  Filled 2016-10-31: qty 1

## 2016-10-31 MED ORDER — ONDANSETRON HCL 8 MG PO TABS
8.0000 mg | ORAL_TABLET | Freq: Once | ORAL | Status: AC
  Administered 2016-10-31: 8 mg via ORAL

## 2016-10-31 MED ORDER — AZACITIDINE CHEMO SQ INJECTION
39.0000 mg/m2 | Freq: Once | INTRAMUSCULAR | Status: AC
  Administered 2016-10-31: 75 mg via SUBCUTANEOUS
  Filled 2016-10-31: qty 3

## 2016-10-31 NOTE — Patient Instructions (Signed)
[facility] Discharge Instructions for Patients Receiving Chemotherapy  Today you received the following chemotherapy agents Vidaza  To help prevent nausea and vomiting after your treatment, we encourage you to take your nausea medication as prescribed.    If you develop nausea and vomiting that is not controlled by your nausea medication, call the clinic.   BELOW ARE SYMPTOMS THAT SHOULD BE REPORTED IMMEDIATELY:  *FEVER GREATER THAN 100.5 F  *CHILLS WITH OR WITHOUT FEVER  NAUSEA AND VOMITING THAT IS NOT CONTROLLED WITH YOUR NAUSEA MEDICATION  *UNUSUAL SHORTNESS OF BREATH  *UNUSUAL BRUISING OR BLEEDING  TENDERNESS IN MOUTH AND THROAT WITH OR WITHOUT PRESENCE OF ULCERS  *URINARY PROBLEMS  *BOWEL PROBLEMS  UNUSUAL RASH Items with * indicate a potential emergency and should be followed up as soon as possible.  Feel free to call the clinic you have any questions or concerns. The clinic phone number is [phone].  Please show the CHEMO ALERT CARD at check-in to the Emergency Department and triage nurse.   

## 2016-10-31 NOTE — Progress Notes (Signed)
Per Dr Irene Limbo OK to give Vidaza with ANC 0.0 and PLTs 50

## 2016-10-31 NOTE — Patient Outreach (Signed)
Transition of care call. Warren [name] was at an MD office visit when I called. I advised his wife I would call him later.  [name] [facility] [phone]  Called pt again at 6:15 and got no answer. Will try again tomorrow.  [name] [facility] [place] [phone]

## 2016-11-01 ENCOUNTER — Ambulatory Visit (HOSPITAL_BASED_OUTPATIENT_CLINIC_OR_DEPARTMENT_OTHER): Payer: Medicare Other

## 2016-11-01 VITALS — BP 150/72 | HR 70 | Temp 97.9°F | Resp 17

## 2016-11-01 DIAGNOSIS — Z5111 Encounter for antineoplastic chemotherapy: Secondary | ICD-10-CM | POA: Diagnosis present

## 2016-11-01 DIAGNOSIS — D4622 Refractory anemia with excess of blasts 2: Secondary | ICD-10-CM | POA: Diagnosis not present

## 2016-11-01 MED ORDER — AZACITIDINE CHEMO SQ INJECTION
39.0000 mg/m2 | Freq: Once | INTRAMUSCULAR | Status: AC
Start: 2016-11-01 — End: 2016-11-01
  Administered 2016-11-01: 75 mg via SUBCUTANEOUS
  Filled 2016-11-01: qty 3

## 2016-11-01 MED ORDER — PROCHLORPERAZINE MALEATE 10 MG PO TABS
ORAL_TABLET | ORAL | Status: AC
  Filled 2016-11-01: qty 1

## 2016-11-01 MED ORDER — ONDANSETRON HCL 8 MG PO TABS
8.0000 mg | ORAL_TABLET | Freq: Once | ORAL | Status: AC
  Administered 2016-11-01: 8 mg via ORAL

## 2016-11-01 NOTE — Patient Instructions (Signed)
[facility] Discharge Instructions for Patients Receiving Chemotherapy  Today you received the following chemotherapy agents Vidaza  To help prevent nausea and vomiting after your treatment, we encourage you to take your nausea medication as prescribed.    If you develop nausea and vomiting that is not controlled by your nausea medication, call the clinic.   BELOW ARE SYMPTOMS THAT SHOULD BE REPORTED IMMEDIATELY:  *FEVER GREATER THAN 100.5 F  *CHILLS WITH OR WITHOUT FEVER  NAUSEA AND VOMITING THAT IS NOT CONTROLLED WITH YOUR NAUSEA MEDICATION  *UNUSUAL SHORTNESS OF BREATH  *UNUSUAL BRUISING OR BLEEDING  TENDERNESS IN MOUTH AND THROAT WITH OR WITHOUT PRESENCE OF ULCERS  *URINARY PROBLEMS  *BOWEL PROBLEMS  UNUSUAL RASH Items with * indicate a potential emergency and should be followed up as soon as possible.  Feel free to call the clinic you have any questions or concerns. The clinic phone number is [phone].  Please show the CHEMO ALERT CARD at check-in to the Emergency Department and triage nurse.   

## 2016-11-01 NOTE — Progress Notes (Signed)
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 Trisomy 8 (Intermediate risk) on FISH studies. R-ISS ( score 7- very high risk)  Current treatment Vidaza s/p 6 cycles Following at [facility] with Dr. Ok Edwards and Dr. Royce Macadamia for evaluation regarding transplant and other treatment options.  INTERVAL HISTORY  [name] is here for his scheduled follow-up for his very high risk MDS. He has completed his planned IV cefazolin course and was switched to maintenance doxycycline as per infectious disease  but a week ago . He continues to be on oral ciprofloxacin for neutropenic infection prophylaxis . His ANC at this point is 0 . Patient was evaluated by Dr. Ok Edwards for possible transplant and is in the process of getting a transplant . I talked to Dr. Ok Edwards and he noted that it would take another 1-2 months to get a donor . He recommended we continue Vidaza. Given his thrombocytopenia and lower non-recovering WBC counts he recommended we go with 1/2 dose Vidaza. He notes no fevers or chills. He recently had chest x-ray and echocardiogram for MSSA bacteremia follow-up with infectious disease which were noted to be unrevealing. Note some mild petechiae on his hands but no other overt bleeding.    MEDICAL HISTORY:  Past Medical History:  Diagnosis Date  . Cancer [facility])    Prostate, Melanoma- lt. Shoulder (no further problems since 2010  . CHF (congestive heart failure) ([place]) 09/13/2016  . Complication of anesthesia    versed gave the adverse reaction pt. ,became aggitated  . COPD (chronic obstructive pulmonary disease) (HCC)    Dr. Annamaria Boots follows  . Diffuse esophageal spasm   .  Diverticulosis   . DJD (degenerative joint disease)   . Multiple trauma     horse accident 2003 L SHOULDER  . Oral herpes   . OSA (obstructive sleep apnea)    mild, rare cpap use  . Peripheral neuropathy ([place])   . Pulmonary nodule   . Spinal stenosis   . Systolic hypertension   Recurrent HSV related cold sores. Acoustic neuroma left 1999 - s/p surgery. Pruritus ani Injury in 2003 related to a horse accident resulting in hydrothorax, pneumothorax, fractured ribs, dislocated left shoulder, rotator cuff tear on the left, left elbow damage and left ulnar nerve damage.   SURGICAL HISTORY: Past Surgical History:  Procedure Laterality Date  . ,laceration to head in accident with hourse    . ACOUSTIC NEUROMA LEFT EAR  1999  . BALLOON DILATION N/A 11/25/2014   Procedure: BALLOON DILATION;  Surgeon: Garlan Fair, MD;  Location: [facility];  Service: Endoscopy;  Laterality: N/A;  . COLONOSCOPY WITH PROPOFOL N/A 10/01/2013   Procedure: COLONOSCOPY WITH PROPOFOL;  Surgeon: Garlan Fair, MD;  Location: [facility];  Service: Endoscopy;  Laterality: N/A;  . ESOPHAGOGASTRODUODENOSCOPY (EGD) WITH PROPOFOL N/A 11/25/2014   Procedure: ESOPHAGOGASTRODUODENOSCOPY (EGD) WITH PROPOFOL;  Surgeon: Garlan Fair, MD;  Location: [facility] END[facility];  Service: Endoscopy;  Laterality: N/A;  . EYE SURGERY     cosmetic surgery"brow lift" 4'15  . MULTIPLE PROCEDURES FOR LEFT ARM     ELBOW,RIB FX PNEUMOTHORAX AFTER FALLING OFF MOUNTAIN  . PILONIDAL CYST / SINUS EXCISION    . RADICAL PROSTATECTOMY    .  TEE WITHOUT CARDIOVERSION N/A 09/20/2016   Procedure: TRANSESOPHAGEAL ECHOCARDIOGRAM (TEE);  Surgeon: Jerline Pain, MD;  Location: [facility] END[facility]COPY;  Service: Cardiovascular;  Laterality: N/A;    SOCIAL HISTORY: Social History   Social History  . Marital status: Married    Spouse name: N/A  . Number of children: N/A  . Years of education: N/A   Occupational History  . Not on file.   Social History  Main Topics  . Smoking status: Former Smoker    Packs/day: 1.00    Years: 40.00    Quit date: 11/29/1995  . Smokeless tobacco: Never Used  . Alcohol use Yes     Comment: 1 drink per day  . Drug use: No  . Sexual activity: Not on file   Other Topics Concern  . Not on file   Social History Narrative   CPAP 8 advanced   Patient drinks 5 cups of caffeine daily   Exercises 3-4 times weekly   Son is a pulmonologist    FAMILY HISTORY: Family History  Problem Relation Age of Onset  . Emphysema Father   . Asthma Father   . Lung cancer Father   . Breast cancer Sister   . Breast cancer Sister   . Skin cancer Brother   . Prostate cancer Brother    Brother with a recent diagnosis of hemachromatosis Brother with CLL/SLL Mother had anemia, thalassemia. Hepatitis C with liver cancer  ALLERGIES:  is allergic to fluconazole and midazolam.  MEDICATIONS:  Current Outpatient Prescriptions  Medication Sig Dispense Refill  . calcium carbonate (TUMS - DOSED IN MG ELEMENTAL CALCIUM) 500 MG chewable tablet Chew 1 tablet by mouth at bedtime.     . chlorhexidine (PERIDEX) 0.12 % solution RINSE 10 ML (CC) IN MOUTH TWICE DAILY AS DIRECTED **SWISH  AND  SPIT,  DO  NOT  SWALLOW** 473 mL 1  . ciprofloxacin (CIPRO) 500 MG tablet Take 1 tablet (500 mg total) by mouth 2 (two) times daily. 60 tablet 1  . doxycycline (VIBRAMYCIN) 100 MG capsule Take 1 capsule (100 mg total) by mouth 2 (two) times daily. 60 capsule 11  . magic mouthwash SOLN Take 5 mLs by mouth 4 (four) times daily as needed for mouth pain. 480 mL 0  . Multiple Vitamin (MULTIVITAMIN WITH MINERALS) TABS tablet Take 1 tablet by mouth daily.    . nitroGLYCERIN (NITROSTAT) 0.4 MG SL tablet Place 1 tablet (0.4 mg total) under the tongue every 5 (five) minutes as needed for chest pain. 25 tablet 1  . omeprazole (PRILOSEC) 40 MG capsule Take 40 mg by mouth daily before lunch. 1/2 hour before.    . ondansetron (ZOFRAN) 4 MG tablet Take 4 mg by mouth  every 8 (eight) hours as needed for nausea or vomiting.     . posaconazole (NOXAFIL) 100 MG TBEC delayed-release tablet Take 3 tablets (300 mg total) by mouth daily. (Patient taking differently: Take 300 mg by mouth at bedtime. ) 90 tablet 0  . senna-docusate (SENNA S) 8.6-50 MG tablet Take 2 tablets by mouth 2 (two) times daily as needed for mild constipation or moderate constipation.    . valACYclovir (VALTREX) 500 MG tablet Take 1 tablet (500 mg total) by mouth 2 (two) times daily. At night to suppress herpes gingivitis 60 tablet 1  . valsartan (DIOVAN) 80 MG tablet Take 40 mg by mouth at bedtime.      No current facility-administered medications for this visit.     REVIEW OF SYSTEMS:  10 Point review of Systems was done is negative except as noted above.  PHYSICAL EXAMINATION: ECOG PERFORMANCE STATUS: 1 - Symptomatic but completely ambulatory  . Vitals:   10/31/16 1248  BP: (!) 154/77  Pulse: 65  Resp: 18  Temp: 98.3 F (36.8 C)   Filed Weights   10/31/16 1248  Weight: 160 lb 9.6 oz (72.8 kg)   .Body mass index is 25.15 kg/m.  GENERAL:alert, in no acute distress and comfortable SKIN: Resolving rash on his upper extremities EYES: normal, conjunctiva are pink and non-injected, sclera clear OROPHARYNX:no exudate, no erythema and lips, buccal mucosa, and tongue normal  NECK: supple, no JVD, thyroid normal size, non-tender, without nodularity LYMPH:  no palpable lymphadenopathy in the cervical, axillary or inguinal LUNGS: clear to auscultation With somewhat decreased entry bilaterally, no wheezing HEART: regular rate & rhythm,  no murmurs and no lower extremity edema ABDOMEN: abdomen soft, non-tender, normoactive bowel sounds , no palpable hepatosplenomegaly Musculoskeletal: no cyanosis of digits. PSYCH: alert & oriented x 3 with fluent speech NEURO: no focal motor deficits.  LABORATORY DATA:  I have reviewed the data as listed  . CBC Latest Ref Rng & Units 10/31/2016  10/24/2016 10/22/2016  WBC 4.0 - 10.3 10e3/uL 0.9(LL) 0.9(LL) 1.2(LL)  Hemoglobin 13.0 - 17.1 g/dL 8.6(L) 8.5(L) 8.8(L)  Hematocrit 38.4 - 49.9 % 28.5(L) 29.0(L) 29.0(L)  Platelets 140 - 400 10e3/uL 50(L) 63(L) 69(L)   . CMP Latest Ref Rng & Units 10/31/2016 10/24/2016 10/22/2016  Glucose 70 - 140 mg/dl 98 127 126(H)  BUN 7.0 - 26.0 mg/dL 15.3 17.5 23(H)  Creatinine 0.7 - 1.3 mg/dL 0.9 0.9 1.06  Sodium 136 - 145 mEq/L 140 140 140  Potassium 3.5 - 5.1 mEq/L 4.0 3.7 3.7  Chloride 101 - 111 mmol/L - - 107  CO2 22 - 29 mEq/L 24 27 27   Calcium 8.4 - 10.4 mg/dL 8.8 8.6 8.5(L)  Total Protein 6.4 - 8.3 g/dL 6.5 6.4 6.3(L)  Total Bilirubin 0.20 - 1.20 mg/dL 1.96(H) 1.64(H) 1.5(H)  Alkaline Phos 40 - 150 U/L 79 76 66  AST 5 - 34 U/L 57(H) 72(H) 61(H)  ALT 0 - 55 U/L 19 18 14(L)      ROSS AND MICROSCOPIC INFORMATION RADIOGRAPHIC STUDIES: I have personally reviewed the radiological images as listed and agreed with the findings in the report. Dg Chest 2 View  Result Date: 10/25/2016 CLINICAL DATA:  Follow-up of bilateral pleural effusions. No current complaints. History of CHF, pulmonary nodules, COPD, former smoker. EXAM: CHEST  2 VIEW COMPARISON:  Chest x-ray of October 13, 2016 FINDINGS: The lungs are adequately inflated. The pleural effusions have resolved. There subtle nodularity in the inferior lateral aspect of the right lung which appears stable. The heart and pulmonary vascularity are normal. There is calcification in the wall of the aortic arch. The right-sided PICC line tip projects over the midportion of the SVC. There is mild multilevel degenerative disc disease of the thoracic spine. IMPRESSION: Chronic bronchitic changes, stable. Interval resolution of tiny bilateral pleural effusions. Stable appearance of an area of nodularity in the right lower lung on the frontal view. No CHF nor other acute cardiopulmonary abnormality. Thoracic aortic atherosclerosis. Electronically Signed   By:  David  Martinique M.D.   On: 10/25/2016 12:58   Dg Chest 2 View  Result Date: 10/13/2016 CLINICAL DATA:  Right pleural effusion. EXAM: CHEST  2 VIEW COMPARISON:  09/29/2016 FINDINGS: Interval improvement in bilateral pleural effusions which are now minimal. Improved aeration of the  lung bases which are now nearly clear. Negative for pneumothorax. PICC tip in the lower SVC. Cardiac enlargement without heart failure or edema. IMPRESSION: Improving pleural effusions bilaterally. Improved aeration in the lung bases. No pneumothorax. Electronically Signed   By: Franchot Gallo M.D.   On: 10/13/2016 15:07    ASSESSMENT & PLAN:   75 year old Caucasian male with  1) Myelodysplastic syndrome (RAEB-2 with about 14-15% blasts) ISS-R score of 7 (Very High Risk) Cytogenetics show trisomy 8 (intermediate risk ) Presented with Subacute Anemia (with high normal MCV) + leukopenia/Neutropenia - new since September 2016. Patient has normal platelet counts at this time. Foundation one results reviewed patient has TET2, ASXL1, BCOR RUNX1 SETBP1 SRSF2 and STAG2 mutations identified.  Repeat bone marrow suggested possibly some decrease in his blasts counts 7% by flow cytometry.  2) Anemia -  hemoglobin is relatively stable after his recent transfusion. No indication for PRBC transfusion today.  3) Neutropenia . Patient continues to have an Erin of 0-100 range with 60-70% bone marrow cellularity arguing against Vidaza effect. This appears to be primarily related to his RAEB 2. His neutrophil count hasn't bounced back to being off Vidaza for a while which confirms that this is likely related to his underlying disease process . Was on antimicrobial prophylaxis with ciprofloxacin.  3) MSSA bacteremia with septic emboli to the lung. No evidence of infective endocarditis on TEE.  patient completed his planned  IV cefazolin as per infectious disease. He has been switched to oral doxycycline for ongoing prophylaxis. He  continues to be on ciprofloxacin for neutropenic prophylaxis and posaconazole for antifungal prophylaxis.  4) thrombocytopenia- platelets counts appear to be coming down progressively. This could be his IV cefazolin and hopefully improves since he is off of this. Could very well be from his underlying myelodysplastic syndrome. PBS - some schistocytes but unlikely to be TTP. Past and petechiae and probably has some platelet dysfunction. No overt other bleeding. PLAN -Hemoglobinis stable today with no indication for additional PRBC transfusions. -Transfuse PRBCs (CMV neg and irradiated) when necessary for symptomatic anemia or if hemoglobin less than 7. We will use it irradiated and CMV negative products only. -When necessary CMV negative irradiated platelet transfusion for platelets less than 20,000 or if bleeding actively. -We'll proceed with half dose Vidaza at 37.5mg /m2 as per Dr. Octaviano Batty recommendation - as a bridge to allogenic stem cell transplantation. -We shall continue his ciprofloxacin for neutropenic prophylaxis at this time. -Continue posaconazole for antifungal prophylaxis. EKG done showed no QTc prolongation. -Continues to be on doxycycline for MSSA maintenance therapy as per Dr. Donell Sievert at [facility] ID -Continue follow-up with Dr. Ok Edwards for Allo HSCT considerations. --Continue weekly labs   Return to care with Dr. Irene Limbo in 2 weeks with repeat labs   I spent 25 minutes counseling the patient face to face. The total time spent in the appointment was 30 minutes and more than 50% was on counseling and direct patient cares and discussions with Dr. Ok Edwards and other physicians at [facility] .    Sullivan Lone MD [place] AAHIVMS [facility] Hematology/Oncology Physician [facility]  (Office):       [phone] (Work cell):  [phone] (Fax):           [phone]

## 2016-11-01 NOTE — Progress Notes (Signed)
HEMATOLOGY/ONCOLOGY CLINIC NOTE  Date of Service: .09/05/2016  Patient Care Team: Wenda Low, MD as PCP - General (Internal Medicine) [name], NP as [place] Management [name], MD as Attending Physician (Oncology)  Dr [name] ([facility])  CHIEF COMPLAINTS: Follow-up for MDS  DIAGNOSIS RAEB-2 with about 15% blasts. Trisomy 8 (Intermediate risk) on FISH studies. R-ISS ( score 7- very high risk)  Current treatment Vidaza s/p 5 cycles Following at [facility] with Dr. Ok Edwards and Dr. Royce Macadamia for evaluation regarding transplant and other treatment options.  INTERVAL HISTORY  Warren [name] is here for his scheduled follow-up for his very high risk MDS. he notes some grade 1 fatigue related to [place] and also his anemia . No fevers or chills. He is taking his antimicrobial prophylaxis appropriately.he is currently getting his sixth cycle of Vidaza today is day #6 . No other acute new symptoms. No fevers or chills. Easy bruisability  MEDICAL HISTORY:  Past Medical History:  Diagnosis Date  . Cancer [facility])    Prostate, Melanoma- lt. Shoulder (no further problems since 2010  . CHF (congestive heart failure) ([place]) 09/13/2016  . Complication of anesthesia    versed gave the adverse reaction pt. ,became aggitated  . COPD (chronic obstructive pulmonary disease) (HCC)    Dr. Annamaria Boots follows  . Diffuse esophageal spasm   . Diverticulosis   . DJD (degenerative joint disease)   . Multiple trauma     horse accident 2003 L SHOULDER  . Oral herpes   . OSA (obstructive sleep apnea)    mild, rare cpap use  . Peripheral neuropathy ([place])   . Pulmonary nodule   . Spinal stenosis   . Systolic hypertension   Recurrent HSV related cold sores. Acoustic neuroma left 1999 - s/p surgery. Pruritus ani Injury in 2003 related to a horse accident resulting in hydrothorax, pneumothorax, fractured ribs, dislocated left shoulder, rotator cuff tear on the left, left elbow  damage and left ulnar nerve damage.   SURGICAL HISTORY: Past Surgical History:  Procedure Laterality Date  . ,laceration to head in accident with hourse    . ACOUSTIC NEUROMA LEFT EAR  1999  . BALLOON DILATION N/A 11/25/2014   Procedure: BALLOON DILATION;  Surgeon: Garlan Fair, MD;  Location: [facility];  Service: Endoscopy;  Laterality: N/A;  . COLONOSCOPY WITH PROPOFOL N/A 10/01/2013   Procedure: COLONOSCOPY WITH PROPOFOL;  Surgeon: Garlan Fair, MD;  Location[facility];  Service: Endoscopy;  Laterality: N/A;  . ESOPHAGOGASTRODUODENOSCOPY (EGD) WITH PROPOFOL N/A 11/25/2014   Procedure: ESOPHAGOGASTRODUODENOSCOPY (EGD) WITH PROPOFOL;  Surgeon: Garlan Fair, MD;  Location[facility];  Service: Endoscopy;  Laterality: N/A;  . EYE SURGERY     cosmetic surgery"brow lift" 4'15  . MULTIPLE PROCEDURES FOR LEFT ARM     ELBOW,RIB FX PNEUMOTHORAX AFTER FALLING OFF MOUNTAIN  . PILONIDAL CYST / SINUS EXCISION    . RADICAL PROSTATECTOMY    . TEE WITHOUT CARDIOVERSION N/A 09/20/2016   Procedure: TRANSESOPHAGEAL ECHOCARDIOGRAM (TEE);  Surgeon: Jerline Pain, MD;  [facility];  Service: Cardiovascular;  Laterality: N/A;    SOCIAL HISTORY: Social History   Social History  . Marital status: Married    Spouse name: N/A  . Number of children: N/A  . Years of education: N/A   Occupational History  . Not on file.   Social History Main Topics  . Smoking status: Former Smoker    Packs/day: 1.00    Years: 40.00  Quit date: 11/29/1995  . Smokeless tobacco: Never Used  . Alcohol use Yes     Comment: 1 drink per day  . Drug use: No  . Sexual activity: Not on file   Other Topics Concern  . Not on file   Social History Narrative   CPAP 8 advanced   Patient drinks 5 cups of caffeine daily   Exercises 3-4 times weekly   Son is a pulmonologist    FAMILY HISTORY: Family History  Problem Relation Age of Onset  . Emphysema Father   . Asthma Father   . Lung  cancer Father   . Breast cancer Sister   . Breast cancer Sister   . Skin cancer Brother   . Prostate cancer Brother    Brother with a recent diagnosis of hemachromatosis Brother with CLL/SLL Mother had anemia, thalassemia. Hepatitis C with liver cancer  ALLERGIES:  is allergic to fluconazole and midazolam.  MEDICATIONS:  Current Outpatient Prescriptions  Medication Sig Dispense Refill  . calcium carbonate (TUMS - DOSED IN MG ELEMENTAL CALCIUM) 500 MG chewable tablet Chew 1 tablet by mouth at bedtime.     . chlorhexidine (PERIDEX) 0.12 % solution RINSE 10 ML (CC) IN MOUTH TWICE DAILY AS DIRECTED **SWISH  AND  SPIT,  DO  NOT  SWALLOW** 473 mL 1  . ciprofloxacin (CIPRO) 500 MG tablet Take 1 tablet (500 mg total) by mouth 2 (two) times daily. 60 tablet 1  . doxycycline (VIBRAMYCIN) 100 MG capsule Take 1 capsule (100 mg total) by mouth 2 (two) times daily. 60 capsule 11  . magic mouthwash SOLN Take 5 mLs by mouth 4 (four) times daily as needed for mouth pain. 480 mL 0  . Multiple Vitamin (MULTIVITAMIN WITH MINERALS) TABS tablet Take 1 tablet by mouth daily.    . nitroGLYCERIN (NITROSTAT) 0.4 MG SL tablet Place 1 tablet (0.4 mg total) under the tongue every 5 (five) minutes as needed for chest pain. 25 tablet 1  . omeprazole (PRILOSEC) 40 MG capsule Take 40 mg by mouth daily before lunch. 1/2 hour before.    . ondansetron (ZOFRAN) 4 MG tablet Take 4 mg by mouth every 8 (eight) hours as needed for nausea or vomiting.     . posaconazole (NOXAFIL) 100 MG TBEC delayed-release tablet Take 3 tablets (300 mg total) by mouth daily. (Patient taking differently: Take 300 mg by mouth at bedtime. ) 90 tablet 0  . senna-docusate (SENNA S) 8.6-50 MG tablet Take 2 tablets by mouth 2 (two) times daily as needed for mild constipation or moderate constipation.    . valACYclovir (VALTREX) 500 MG tablet Take 1 tablet (500 mg total) by mouth 2 (two) times daily. At night to suppress herpes gingivitis 60 tablet 1  .  valsartan (DIOVAN) 80 MG tablet Take 40 mg by mouth at bedtime.      No current facility-administered medications for this visit.     REVIEW OF SYSTEMS:    10 Point review of Systems was done is negative except as noted above.  PHYSICAL EXAMINATION: ECOG PERFORMANCE STATUS: 1 - Symptomatic but completely ambulatory  . Vitals:   09/05/16 1327  BP: 121/60  Pulse: 69  Resp: 18  Temp: 98.4 F (36.9 C)   Filed Weights   09/05/16 1327  Weight: 167 lb 14.4 oz (76.2 kg)   .Body mass index is 26.3 kg/m.  GENERAL:alert, in no acute distress and comfortable SKIN: Resolving rash on his upper extremities EYES: normal, conjunctiva are pink and  non-injected, sclera clear OROPHARYNX:no exudate, no erythema and lips, buccal mucosa, and tongue normal  NECK: supple, no JVD, thyroid normal size, non-tender, without nodularity LYMPH:  no palpable lymphadenopathy in the cervical, axillary or inguinal LUNGS: clear to auscultation With somewhat decreased entry bilaterally, no wheezing HEART: regular rate & rhythm,  no murmurs and no lower extremity edema ABDOMEN: abdomen soft, non-tender, normoactive bowel sounds , no palpable hepatosplenomegaly Musculoskeletal: no cyanosis of digits. PSYCH: alert & oriented x 3 with fluent speech NEURO: no focal motor deficits.  LABORATORY DATA:  I have reviewed the data as listed Component     Latest Ref Rng & Units 08/29/2016 09/05/2016  WBC     4.0 - 10.3 10e3/uL 1.3 (L) 1.1 (L)  NEUT#     1.5 - 6.5 10e3/uL 0.1 (LL) 0.1 (LL)  Hemoglobin     13.0 - 17.1 g/dL 8.9 (L) 8.3 (L)  HCT     38.4 - 49.9 % 30.3 (L) 28.3 (L)  Platelets     140 - 400 10e3/uL 147 113 (L)  MCV     79.3 - 98.0 fL 97.7 96.3  MCH     27.2 - 33.4 pg 28.7 28.2  MCHC     32.0 - 36.0 g/dL 29.4 (L) 29.3 (L)  RBC     4.20 - 5.82 10e6/uL 3.10 (L) 2.94 (L)  RDW     11.0 - 14.6 % 20.6 (H) 19.8 (H)  lymph#     0.9 - 3.3 10e3/uL 1.2 1.0  MONO#     0.1 - 0.9 10e3/uL 0.1 0.0 (L)    Eosinophils Absolute     0.0 - 0.5 10e3/uL 0.0 0.0  Basophils Absolute     0.0 - 0.1 10e3/uL 0.0 0.0  NEUT%     39.0 - 75.0 % 5.4 (L) 9.7 (L)  LYMPH%     14.0 - 49.0 % 89.9 (H) 85.1 (H)  MONO%     0.0 - 14.0 % 3.9 2.6  EOS%     0.0 - 7.0 % 0.8 2.6  BASO%     0.0 - 2.0 % 0.0 0.0  nRBC     0 - 0 %  3 (H)  Retic %     0.80 - 1.80 % 3.91 (H) 2.49 (H)  Retic Ct Abs     34.80 - 93.90 10e3/uL 121.21 (H) 73.21  Immature Retic Fract     3.00 - 10.60 % 35.60 (H) 31.30 (H)  Sodium     136 - 145 mEq/L 141   Potassium     3.5 - 5.1 mEq/L 4.0   Chloride     98 - 109 mEq/L 109   CO2     22 - 29 mEq/L 25   Glucose     70 - 140 mg/dl 116   BUN     7.0 - 26.0 mg/dL 20.8   Creatinine     0.7 - 1.3 mg/dL 1.1   Total Bilirubin     0.20 - 1.20 mg/dL 1.05   Alkaline Phosphatase     40 - 150 U/L 63   AST     5 - 34 U/L 29   ALT     0 - 55 U/L 22   Total Protein     6.4 - 8.3 g/dL 6.3 (L)   Albumin     3.5 - 5.0 g/dL 3.3 (L)   Calcium     8.4 - 10.4 mg/dL 8.5   Anion gap  3 - 11 mEq/L 7   EGFR     >90 ml/min/1.73 m2 63 (L)   ROSS AND MICROSCOPIC INFORMATION RADIOGRAPHIC STUDIES: I have personally reviewed the radiological images as listed and agreed with the findings in the report.  ASSESSMENT & PLAN:   75 year old Caucasian male with  1) Myelodysplastic syndrome (RAEB-2 with about 14-15% blasts) ISS-R score of 7 (Very High Risk) Cytogenetics show trisomy 8 (intermediate risk ) Presented with Subacute Anemia (with high normal MCV) + leukopenia/Neutropenia - new since September 2016. Patient has normal platelet counts at this time. Foundation one results reviewed patient has TET2, ASXL1, BCOR RUNX1 SETBP1 SRSF2 and STAG2 mutations identified. Repeat after his bone marrow suggested possibly some decrease in his blasts counts 7% by flow cytometry. Today is cycle 6 day 6 of Vidaza . 2) Anemia -  hemoglobin is relatively stable after his recent transfusion. No indication  for PRBC transfusion today. 3) Neutropenia . Patient continues to have an [place] of 100-200 range with 60-70% bone marrow cellularity arguing against Vidaza effect. This appears to be primarily related to his RAEB 2. He had some chills and was in the emergency room. Has been started on ciprofloxacin. PLAN -Hemoglobin and platelet counts are stable today with no indication for additional PRBC transfusions. -Transfuse PRBCs (CMV neg and irradiated) when necessary for symptomatic anemia or if hemoglobin less than 7. We will use it irradiated and CMV negative products only. -We shall continue his ciprofloxacin for neutropenic prophylaxis at this time. -Given his persistent profound neutropenia the patient requires antifungal prophylaxis. He seems to have a gradually reappearing a rash with fluconazole. he is willing to be challenged with posaconazole to see if you might tolerate this without a worsening rash. He shall be applying for co-pay assistance given that even after insurance this medication is costing of nearly about 2000$ per months. -Continue Vidaza as per plan to complete cycle 6 on 09/06/2016. -Continue weekly CBC to monitor for transfusion requirements.   Continue weekly CBC CT-guided bone marrow aspiration and biopsy on 09/19/2016. Return to care with Dr. Irene Limbo 09/23/2016. (3-4 days after Bone marrow examination)  We'll continue to follow the patient every 2 weeks and have labs every week . Patient is agreeable to this plan of care.  I spent 20 minutes counseling the patient face to face. The total time spent in the appointment was 25 minutes and more than 50% was on counseling and direct patient cares.    Sullivan Lone MD [place] AAHIVMS [facility] Hematology/Oncology Physician [facility]  (Office):       [phone] (Work cell):  [phone] (Fax):           [phone]

## 2016-11-02 ENCOUNTER — Encounter: Payer: Self-pay | Admitting: Cardiology

## 2016-11-02 ENCOUNTER — Ambulatory Visit (HOSPITAL_BASED_OUTPATIENT_CLINIC_OR_DEPARTMENT_OTHER): Payer: Medicare Other

## 2016-11-02 VITALS — BP 145/77 | HR 64 | Temp 97.3°F | Resp 16

## 2016-11-02 DIAGNOSIS — Z5111 Encounter for antineoplastic chemotherapy: Secondary | ICD-10-CM

## 2016-11-02 DIAGNOSIS — D4622 Refractory anemia with excess of blasts 2: Secondary | ICD-10-CM | POA: Diagnosis not present

## 2016-11-02 MED ORDER — AZACITIDINE CHEMO SQ INJECTION
39.0000 mg/m2 | Freq: Once | INTRAMUSCULAR | Status: AC
  Administered 2016-11-02: 75 mg via SUBCUTANEOUS
  Filled 2016-11-02: qty 3

## 2016-11-02 MED ORDER — ONDANSETRON HCL 8 MG PO TABS
8.0000 mg | ORAL_TABLET | Freq: Once | ORAL | Status: AC
  Administered 2016-11-02: 8 mg via ORAL

## 2016-11-02 MED ORDER — ONDANSETRON HCL 8 MG PO TABS
ORAL_TABLET | ORAL | Status: AC
  Filled 2016-11-02: qty 1

## 2016-11-02 NOTE — Patient Instructions (Signed)
[facility] Discharge Instructions for Patients Receiving Chemotherapy  Today you received the following chemotherapy agents Vidaza  To help prevent nausea and vomiting after your treatment, we encourage you to take your nausea medication   If you develop nausea and vomiting that is not controlled by your nausea medication, call the clinic.   BELOW ARE SYMPTOMS THAT SHOULD BE REPORTED IMMEDIATELY:  *FEVER GREATER THAN 100.5 F  *CHILLS WITH OR WITHOUT FEVER  NAUSEA AND VOMITING THAT IS NOT CONTROLLED WITH YOUR NAUSEA MEDICATION  *UNUSUAL SHORTNESS OF BREATH  *UNUSUAL BRUISING OR BLEEDING  TENDERNESS IN MOUTH AND THROAT WITH OR WITHOUT PRESENCE OF ULCERS  *URINARY PROBLEMS  *BOWEL PROBLEMS  UNUSUAL RASH Items with * indicate a potential emergency and should be followed up as soon as possible.  Feel free to call the clinic you have any questions or concerns. The clinic phone number is [phone].  Please show the CHEMO ALERT CARD at check-in to the Emergency Department and triage nurse.   

## 2016-11-03 ENCOUNTER — Ambulatory Visit (HOSPITAL_BASED_OUTPATIENT_CLINIC_OR_DEPARTMENT_OTHER): Payer: Medicare Other

## 2016-11-03 ENCOUNTER — Other Ambulatory Visit: Payer: Self-pay | Admitting: Hematology

## 2016-11-03 VITALS — BP 143/74 | HR 69 | Temp 98.7°F | Resp 16

## 2016-11-03 DIAGNOSIS — D4622 Refractory anemia with excess of blasts 2: Secondary | ICD-10-CM | POA: Diagnosis not present

## 2016-11-03 DIAGNOSIS — Z5111 Encounter for antineoplastic chemotherapy: Secondary | ICD-10-CM | POA: Diagnosis present

## 2016-11-03 MED ORDER — ONDANSETRON HCL 8 MG PO TABS
8.0000 mg | ORAL_TABLET | Freq: Once | ORAL | Status: AC
  Administered 2016-11-03: 8 mg via ORAL

## 2016-11-03 MED ORDER — AZACITIDINE CHEMO SQ INJECTION
39.0000 mg/m2 | Freq: Once | INTRAMUSCULAR | Status: AC
  Administered 2016-11-03: 75 mg via SUBCUTANEOUS
  Filled 2016-11-03: qty 3

## 2016-11-03 MED ORDER — ONDANSETRON HCL 8 MG PO TABS
ORAL_TABLET | ORAL | Status: AC
  Filled 2016-11-03: qty 1

## 2016-11-03 NOTE — Patient Instructions (Signed)
[facility] Discharge Instructions for Patients Receiving Chemotherapy  Today you received the following chemotherapy agents vidaza   To help prevent nausea and vomiting after your treatment, we encourage you to take your nausea medication as directed. If you develop nausea and vomiting that is not controlled by your nausea medication, call the clinic.   BELOW ARE SYMPTOMS THAT SHOULD BE REPORTED IMMEDIATELY:  *FEVER GREATER THAN 100.5 F  *CHILLS WITH OR WITHOUT FEVER  NAUSEA AND VOMITING THAT IS NOT CONTROLLED WITH YOUR NAUSEA MEDICATION  *UNUSUAL SHORTNESS OF BREATH  *UNUSUAL BRUISING OR BLEEDING  TENDERNESS IN MOUTH AND THROAT WITH OR WITHOUT PRESENCE OF ULCERS  *URINARY PROBLEMS  *BOWEL PROBLEMS  UNUSUAL RASH Items with * indicate a potential emergency and should be followed up as soon as possible.  Feel free to call the clinic you have any questions or concerns. The clinic phone number is [phone].  

## 2016-11-04 ENCOUNTER — Ambulatory Visit (HOSPITAL_BASED_OUTPATIENT_CLINIC_OR_DEPARTMENT_OTHER): Payer: Medicare Other

## 2016-11-04 VITALS — BP 138/65 | HR 69 | Temp 98.4°F | Resp 16

## 2016-11-04 DIAGNOSIS — Z5111 Encounter for antineoplastic chemotherapy: Secondary | ICD-10-CM

## 2016-11-04 DIAGNOSIS — D4622 Refractory anemia with excess of blasts 2: Secondary | ICD-10-CM

## 2016-11-04 MED ORDER — ONDANSETRON HCL 8 MG PO TABS
8.0000 mg | ORAL_TABLET | Freq: Once | ORAL | Status: AC
  Administered 2016-11-04: 8 mg via ORAL

## 2016-11-04 MED ORDER — AZACITIDINE CHEMO SQ INJECTION
39.0000 mg/m2 | Freq: Once | INTRAMUSCULAR | Status: AC
  Administered 2016-11-04: 75 mg via SUBCUTANEOUS
  Filled 2016-11-04: qty 3

## 2016-11-04 MED ORDER — ONDANSETRON HCL 8 MG PO TABS
ORAL_TABLET | ORAL | Status: AC
  Filled 2016-11-04: qty 1

## 2016-11-04 NOTE — Progress Notes (Signed)
No treatment parameters regarding Bilirubin, okay to proceed with treatment.

## 2016-11-04 NOTE — Patient Instructions (Signed)
[facility] Discharge Instructions for Patients Receiving Chemotherapy  Today you received the following chemotherapy agents: Vidaza   To help prevent nausea and vomiting after your treatment, we encourage you to take your nausea medication as directed.    If you develop nausea and vomiting that is not controlled by your nausea medication, call the clinic.   BELOW ARE SYMPTOMS THAT SHOULD BE REPORTED IMMEDIATELY:  *FEVER GREATER THAN 100.5 F  *CHILLS WITH OR WITHOUT FEVER  NAUSEA AND VOMITING THAT IS NOT CONTROLLED WITH YOUR NAUSEA MEDICATION  *UNUSUAL SHORTNESS OF BREATH  *UNUSUAL BRUISING OR BLEEDING  TENDERNESS IN MOUTH AND THROAT WITH OR WITHOUT PRESENCE OF ULCERS  *URINARY PROBLEMS  *BOWEL PROBLEMS  UNUSUAL RASH Items with * indicate a potential emergency and should be followed up as soon as possible.  Feel free to call the clinic you have any questions or concerns. The clinic phone number is [phone].  Please show the CHEMO ALERT CARD at check-in to the Emergency Department and triage nurse.   

## 2016-11-07 ENCOUNTER — Ambulatory Visit (HOSPITAL_BASED_OUTPATIENT_CLINIC_OR_DEPARTMENT_OTHER): Payer: Medicare Other

## 2016-11-07 ENCOUNTER — Encounter: Payer: Self-pay | Admitting: Cardiology

## 2016-11-07 ENCOUNTER — Encounter: Payer: Self-pay | Admitting: *Deleted

## 2016-11-07 ENCOUNTER — Ambulatory Visit (INDEPENDENT_AMBULATORY_CARE_PROVIDER_SITE_OTHER): Payer: Medicare Other | Admitting: Cardiology

## 2016-11-07 VITALS — BP 138/68 | HR 76 | Ht 67.0 in | Wt 163.0 lb

## 2016-11-07 DIAGNOSIS — Z5111 Encounter for antineoplastic chemotherapy: Secondary | ICD-10-CM

## 2016-11-07 DIAGNOSIS — E785 Hyperlipidemia, unspecified: Secondary | ICD-10-CM | POA: Diagnosis not present

## 2016-11-07 DIAGNOSIS — D4622 Refractory anemia with excess of blasts 2: Secondary | ICD-10-CM | POA: Diagnosis present

## 2016-11-07 DIAGNOSIS — I2510 Atherosclerotic heart disease of native coronary artery without angina pectoris: Secondary | ICD-10-CM | POA: Diagnosis not present

## 2016-11-07 DIAGNOSIS — I10 Essential (primary) hypertension: Secondary | ICD-10-CM

## 2016-11-07 LAB — CBC & DIFF AND RETIC
BASO%: 0 % (ref 0.0–2.0)
BASOS ABS: 0 10*3/uL (ref 0.0–0.1)
EOS ABS: 0 10*3/uL (ref 0.0–0.5)
EOS%: 1.3 % (ref 0.0–7.0)
HEMATOCRIT: 25.8 % — AB (ref 38.4–49.9)
HGB: 7.8 g/dL — ABNORMAL LOW (ref 13.0–17.1)
IMMATURE RETIC FRACT: 19.2 % — AB (ref 3.00–10.60)
LYMPH#: 0.7 10*3/uL — AB (ref 0.9–3.3)
LYMPH%: 87.5 % — ABNORMAL HIGH (ref 14.0–49.0)
MCH: 30 pg (ref 27.2–33.4)
MCHC: 30.2 g/dL — ABNORMAL LOW (ref 32.0–36.0)
MCV: 99.2 fL — ABNORMAL HIGH (ref 79.3–98.0)
MONO#: 0 10*3/uL — AB (ref 0.1–0.9)
MONO%: 5 % (ref 0.0–14.0)
NEUT#: 0.1 10*3/uL — CL (ref 1.5–6.5)
NEUT%: 6.2 % — AB (ref 39.0–75.0)
PLATELETS: 53 10*3/uL — AB (ref 140–400)
RBC: 2.6 10*6/uL — ABNORMAL LOW (ref 4.20–5.82)
RDW: 21.2 % — ABNORMAL HIGH (ref 11.0–14.6)
RETIC CT ABS: 28.86 10*3/uL — AB (ref 34.80–93.90)
Retic %: 1.11 % (ref 0.80–1.80)
WBC: 0.8 10*3/uL — CL (ref 4.0–10.3)

## 2016-11-07 LAB — COMPREHENSIVE METABOLIC PANEL
ALT: 28 U/L (ref 0–55)
AST: 46 U/L — ABNORMAL HIGH (ref 5–34)
Albumin: 3.3 g/dL — ABNORMAL LOW (ref 3.5–5.0)
Alkaline Phosphatase: 74 U/L (ref 40–150)
Anion Gap: 8 mEq/L (ref 3–11)
BUN: 18.7 mg/dL (ref 7.0–26.0)
CHLORIDE: 109 meq/L (ref 98–109)
CO2: 25 meq/L (ref 22–29)
Calcium: 8.3 mg/dL — ABNORMAL LOW (ref 8.4–10.4)
Creatinine: 1.1 mg/dL (ref 0.7–1.3)
EGFR: 67 mL/min/{1.73_m2} — AB (ref >90)
GLUCOSE: 100 mg/dL (ref 70–140)
POTASSIUM: 4.1 meq/L (ref 3.5–5.1)
SODIUM: 142 meq/L (ref 136–145)
TOTAL PROTEIN: 6.4 g/dL (ref 6.4–8.3)
Total Bilirubin: 2.07 mg/dL — ABNORMAL HIGH (ref 0.20–1.20)

## 2016-11-07 LAB — TECHNOLOGIST REVIEW

## 2016-11-07 MED ORDER — ONDANSETRON HCL 8 MG PO TABS
ORAL_TABLET | ORAL | Status: AC
  Filled 2016-11-07: qty 1

## 2016-11-07 MED ORDER — ONDANSETRON HCL 8 MG PO TABS
8.0000 mg | ORAL_TABLET | Freq: Once | ORAL | Status: AC
  Administered 2016-11-07: 8 mg via ORAL

## 2016-11-07 MED ORDER — AZACITIDINE CHEMO SQ INJECTION
39.0000 mg/m2 | Freq: Once | INTRAMUSCULAR | Status: AC
  Administered 2016-11-07: 75 mg via SUBCUTANEOUS
  Filled 2016-11-07: qty 3

## 2016-11-07 NOTE — Progress Notes (Signed)
Cardiology Office Note    Date:  11/07/2016   ID:  Warren [name], DOB 1941/07/24, MRN [id number]  PCP:  Wenda Low, MD  Cardiologist:  Fransico Him, MD   No chief complaint on file.   History of Present Illness:  Warren [name] is a 75 y.o. male with a history of COPD, HTN, coronary artery calcifications with 40-50% LAD and mild stenosis of the ostial L Circ by coronary CTA.No ischemia by stress test 2015.   He occasionally has some DOE when going up stairs and has mild COPD followed by Dr. Annamaria Boots as well as MDS.  He was diagnosed with MDS last Spring and has been on chemo without any improvement and now is looking at a stem cell transplant early next year. He was recently in the hospital for 2 weeks with a staph infection and acute diastolic CHF and echo showed normal LVH.[name]  He was diuresed while in the hospital.  He is severely neutropenic. He denies any chest pain or pressure, dizziness, palpitations or syncope. He has chronic LE edema but it is stable.      Past Medical History:  Diagnosis Date  . Cancer [facility])    Prostate, Melanoma- lt. Shoulder (no further problems since 2010  . CHF (congestive heart failure) ([place]) 09/13/2016  . Complication of anesthesia    versed gave the adverse reaction pt. ,became aggitated  . COPD (chronic obstructive pulmonary disease) (HCC)    Dr. Annamaria Boots follows  . Diffuse esophageal spasm   . Diverticulosis   . DJD (degenerative joint disease)   . Multiple trauma     horse accident 2003 L SHOULDER  . Oral herpes   . OSA (obstructive sleep apnea)    mild, rare cpap use  . Peripheral neuropathy ([place])   . Pulmonary nodule   . Spinal stenosis   . Systolic hypertension     Past Surgical History:  Procedure Laterality Date  . ,laceration to head in accident with hourse    . ACOUSTIC NEUROMA LEFT EAR  1999  . BALLOON DILATION N/A 11/25/2014   Procedure: BALLOON DILATION;  Surgeon: Garlan Fair, MD;  Location: [facility];  Service:  Endoscopy;  Laterality: N/A;  . COLONOSCOPY WITH PROPOFOL N/A 10/01/2013   Procedure: COLONOSCOPY WITH PROPOFOL;  Surgeon: Garlan Fair, MD;  [facility];  Service: Endoscopy;  Laterality: N/A;  . ESOPHAGOGASTRODUODENOSCOPY (EGD) WITH PROPOFOL N/A 11/25/2014   Procedure: ESOPHAGOGASTRODUODENOSCOPY (EGD) WITH PROPOFOL;  Surgeon: Garlan Fair, MD;  [facility];  Service: Endoscopy;  Laterality: N/A;  . EYE SURGERY     cosmetic surgery"brow lift" 4'15  . MULTIPLE PROCEDURES FOR LEFT ARM     ELBOW,RIB FX PNEUMOTHORAX AFTER FALLING OFF MOUNTAIN  . PILONIDAL CYST / SINUS EXCISION    . RADICAL PROSTATECTOMY    . TEE WITHOUT CARDIOVERSION N/A 09/20/2016   Procedure: TRANSESOPHAGEAL ECHOCARDIOGRAM (TEE);  Surgeon: Jerline Pain, MD;  Location: [facility] ENDOSCOPY;  Service: Cardiovascular;  Laterality: N/A;    Current Medications: Outpatient Medications Prior to Visit  Medication Sig Dispense Refill  . calcium carbonate (TUMS - DOSED IN MG ELEMENTAL CALCIUM) 500 MG chewable tablet Chew 1 tablet by mouth at bedtime.     . chlorhexidine (PERIDEX) 0.12 % solution RINSE 10 ML (CC) IN MOUTH TWICE DAILY AS DIRECTED **SWISH  AND  SPIT,  DO  NOT  SWALLOW** 473 mL 1  . ciprofloxacin (CIPRO) 500 MG tablet Take 1 tablet (500 mg total) by mouth 2 (  two) times daily. 60 tablet 1  . doxycycline (VIBRAMYCIN) 100 MG capsule Take 1 capsule (100 mg total) by mouth 2 (two) times daily. 60 capsule 11  . magic mouthwash SOLN Take 5 mLs by mouth 4 (four) times daily as needed for mouth pain. 480 mL 0  . Multiple Vitamin (MULTIVITAMIN WITH MINERALS) TABS tablet Take 1 tablet by mouth daily.    . nitroGLYCERIN (NITROSTAT) 0.4 MG SL tablet Place 1 tablet (0.4 mg total) under the tongue every 5 (five) minutes as needed for chest pain. 25 tablet 1  . omeprazole (PRILOSEC) 40 MG capsule Take 40 mg by mouth daily before lunch. 1/2 hour before.    . ondansetron (ZOFRAN) 4 MG tablet Take 4 mg by mouth every 8  (eight) hours as needed for nausea or vomiting.     . posaconazole (NOXAFIL) 100 MG TBEC delayed-release tablet Take 3 tablets (300 mg total) by mouth daily. (Patient taking differently: Take 300 mg by mouth at bedtime. ) 90 tablet 0  . senna-docusate (SENNA S) 8.6-50 MG tablet Take 2 tablets by mouth 2 (two) times daily as needed for mild constipation or moderate constipation.    . valACYclovir (VALTREX) 500 MG tablet TAKE ONE TABLET BY MOUTH TWICE DAILY AT  NIGHT  TO  SUPPRESS  HERPES  GINGIVITIS 60 tablet 1  . valsartan (DIOVAN) 80 MG tablet Take 40 mg by mouth at bedtime.      No facility-administered medications prior to visit.      Allergies:   Fluconazole and Midazolam   Social History   Social History  . Marital status: Married    Spouse name: N/A  . Number of children: N/A  . Years of education: N/A   Social History Main Topics  . Smoking status: Former Smoker    Packs/day: 1.00    Years: 40.00    Quit date: 11/29/1995  . Smokeless tobacco: Never Used  . Alcohol use Yes     Comment: 1 drink per day  . Drug use: No  . Sexual activity: Not on file   Other Topics Concern  . Not on file   Social History Narrative   CPAP 8 advanced   Patient drinks 5 cups of caffeine daily   Exercises 3-4 times weekly   Son is a pulmonologist     Family History:  The patient's family history includes Asthma in his father; Breast cancer in his sister and sister; Emphysema in his father; Lung cancer in his father; Prostate cancer in his brother; Skin cancer in his brother.   ROS:   Please see the history of present illness.    ROS All other systems reviewed and are negative.  No flowsheet data found.     PHYSICAL EXAM:   VS:  BP 138/68   Pulse 76   Ht 5\' 7"  (1.702 m)   Wt 163 lb (73.9 kg)   BMI 25.53 kg/m    GEN: Well nourished, well developed, in no acute distress  HEENT: normal  Neck: no JVD, carotid bruits, or masses Cardiac: RRR; no murmurs, rubs, or gallops.  Trace  edema.  Intact distal pulses bilaterally.  Respiratory:  clear to auscultation bilaterally, normal work of breathing GI: soft, nontender, nondistended, + BS MS: no deformity or atrophy  Skin: warm and dry, no rash Neuro:  Alert and Oriented x 3, Strength and sensation are intact Psych: euthymic mood, full affect  Wt Readings from Last 3 Encounters:  11/07/16 163 lb (73.9 kg)  10/31/16 160 lb 9.6 oz (72.8 kg)  10/25/16 158 lb 12.8 oz (72 kg)      Studies/Labs Reviewed:   EKG:  EKG is not ordered today.   Recent Labs: 03/09/2016: Pro B Natriuretic peptide (BNP) 46.0 09/14/2016: TSH 0.848 09/17/2016: B Natriuretic Peptide 37.1 10/31/2016: ALT 19; BUN 15.3; Creatinine 0.9; HGB 8.6; Platelets 50; Potassium 4.0; Sodium 140   Lipid Panel    Component Value Date/Time   CHOL 70 09/23/2016 1900   TRIG 109.0 11/13/2013 0819   HDL 62.80 11/13/2013 0819   CHOLHDL 2 11/13/2013 0819   VLDL 21.8 11/13/2013 0819   LDLCALC 53 11/13/2013 0819    Additional studies/ records that were reviewed today include:  none    ASSESSMENT:    1. Coronary artery disease involving native coronary artery of native heart without angina pectoris   2. Benign essential HTN   3. Dyslipidemia      PLAN:  In order of problems listed above:  1. ASCAD with cath showing 40-50% LAD and mild stenosis of the ostial L Circ by coronary CTA.with no angina. Not on ASA due to thrombocytopenia from his MDS. 2. HTN - BP controlled on current meds. Continue diovan.   3. Dyslipidemia - LDL goal < 70.  He had been on simvastatin and he says that somewhere along the line he was taken off of it but he is not sure who.  I will check with his oncologist to see if it is ok to restart it.  Check FLP and ALT.    Medication Adjustments/Labs and Tests Ordered: Current medicines are reviewed at length with the patient today.  Concerns regarding medicines are outlined above.  Medication changes, Labs and Tests ordered today are  listed in the Patient Instructions below.  There are no Patient Instructions on file for this visit.   Signed, Fransico Him, MD  11/07/2016 2:39 PM    [address], [place], [place]  [address] Phone: [phone]; Fax: [phone]

## 2016-11-07 NOTE — Patient Instructions (Signed)
[facility] Discharge Instructions for Patients Receiving Chemotherapy  Today you received the following chemotherapy agents: Vidaza   To help prevent nausea and vomiting after your treatment, we encourage you to take your nausea medication as directed.    If you develop nausea and vomiting that is not controlled by your nausea medication, call the clinic.   BELOW ARE SYMPTOMS THAT SHOULD BE REPORTED IMMEDIATELY:  *FEVER GREATER THAN 100.5 F  *CHILLS WITH OR WITHOUT FEVER  NAUSEA AND VOMITING THAT IS NOT CONTROLLED WITH YOUR NAUSEA MEDICATION  *UNUSUAL SHORTNESS OF BREATH  *UNUSUAL BRUISING OR BLEEDING  TENDERNESS IN MOUTH AND THROAT WITH OR WITHOUT PRESENCE OF ULCERS  *URINARY PROBLEMS  *BOWEL PROBLEMS  UNUSUAL RASH Items with * indicate a potential emergency and should be followed up as soon as possible.  Feel free to call the clinic you have any questions or concerns. The clinic phone number is [phone].  Please show the CHEMO ALERT CARD at check-in to the Emergency Department and triage nurse.   

## 2016-11-07 NOTE — Patient Instructions (Signed)
Medication Instructions:  Your physician recommends that you continue on your current medications as directed. Please refer to the Current Medication list given to you today.   Labwork: Your physician recommends that you return for FASTING lab work.  Testing/Procedures: None  Follow-Up: Your physician wants you to follow-up in: 1 year with Dr. Turner. You will receive a reminder letter in the mail two months in advance. If you don't receive a letter, please call our office to schedule the follow-up appointment.   Any Other Special Instructions Will Be Listed Below (If Applicable).     If you need a refill on your cardiac medications before your next appointment, please call your pharmacy.   

## 2016-11-07 NOTE — Progress Notes (Signed)
Ok to treat with CBC per MD Shadad (on call)

## 2016-11-08 ENCOUNTER — Ambulatory Visit (HOSPITAL_BASED_OUTPATIENT_CLINIC_OR_DEPARTMENT_OTHER): Payer: Medicare Other

## 2016-11-08 VITALS — BP 138/82 | HR 108 | Temp 98.2°F | Resp 18

## 2016-11-08 DIAGNOSIS — Z5111 Encounter for antineoplastic chemotherapy: Secondary | ICD-10-CM

## 2016-11-08 DIAGNOSIS — D4622 Refractory anemia with excess of blasts 2: Secondary | ICD-10-CM | POA: Diagnosis not present

## 2016-11-08 MED ORDER — AZACITIDINE CHEMO SQ INJECTION
39.0000 mg/m2 | Freq: Once | INTRAMUSCULAR | Status: AC
  Administered 2016-11-08: 75 mg via SUBCUTANEOUS
  Filled 2016-11-08: qty 3

## 2016-11-08 MED ORDER — ONDANSETRON HCL 8 MG PO TABS
8.0000 mg | ORAL_TABLET | Freq: Once | ORAL | Status: AC
  Administered 2016-11-08: 8 mg via ORAL

## 2016-11-08 MED ORDER — ONDANSETRON HCL 8 MG PO TABS
ORAL_TABLET | ORAL | Status: AC
  Filled 2016-11-08: qty 1

## 2016-11-08 NOTE — Patient Instructions (Signed)
[facility] Discharge Instructions for Patients Receiving Chemotherapy  Today you received the following chemotherapy agents: Vidaza   To help prevent nausea and vomiting after your treatment, we encourage you to take your nausea medication as directed.    If you develop nausea and vomiting that is not controlled by your nausea medication, call the clinic.   BELOW ARE SYMPTOMS THAT SHOULD BE REPORTED IMMEDIATELY:  *FEVER GREATER THAN 100.5 F  *CHILLS WITH OR WITHOUT FEVER  NAUSEA AND VOMITING THAT IS NOT CONTROLLED WITH YOUR NAUSEA MEDICATION  *UNUSUAL SHORTNESS OF BREATH  *UNUSUAL BRUISING OR BLEEDING  TENDERNESS IN MOUTH AND THROAT WITH OR WITHOUT PRESENCE OF ULCERS  *URINARY PROBLEMS  *BOWEL PROBLEMS  UNUSUAL RASH Items with * indicate a potential emergency and should be followed up as soon as possible.  Feel free to call the clinic you have any questions or concerns. The clinic phone number is [phone].  Please show the CHEMO ALERT CARD at check-in to the Emergency Department and triage nurse.   

## 2016-11-09 ENCOUNTER — Other Ambulatory Visit: Payer: Medicare Other

## 2016-11-11 ENCOUNTER — Other Ambulatory Visit: Payer: Self-pay | Admitting: *Deleted

## 2016-11-11 DIAGNOSIS — D469 Myelodysplastic syndrome, unspecified: Secondary | ICD-10-CM

## 2016-11-11 DIAGNOSIS — R600 Localized edema: Secondary | ICD-10-CM

## 2016-11-11 DIAGNOSIS — R609 Edema, unspecified: Secondary | ICD-10-CM

## 2016-11-14 ENCOUNTER — Other Ambulatory Visit (HOSPITAL_BASED_OUTPATIENT_CLINIC_OR_DEPARTMENT_OTHER): Payer: Medicare Other

## 2016-11-14 DIAGNOSIS — D4622 Refractory anemia with excess of blasts 2: Secondary | ICD-10-CM

## 2016-11-14 LAB — CBC & DIFF AND RETIC
BASO%: 0 % (ref 0.0–2.0)
Basophils Absolute: 0 10*3/uL (ref 0.0–0.1)
EOS%: 1.1 % (ref 0.0–7.0)
Eosinophils Absolute: 0 10*3/uL (ref 0.0–0.5)
HCT: 24.6 % — ABNORMAL LOW (ref 38.4–49.9)
HGB: 7.4 g/dL — ABNORMAL LOW (ref 13.0–17.1)
Immature Retic Fract: 36.4 % — ABNORMAL HIGH (ref 3.00–10.60)
LYMPH#: 0.8 10*3/uL — AB (ref 0.9–3.3)
LYMPH%: 87.6 % — AB (ref 14.0–49.0)
MCH: 29.8 pg (ref 27.2–33.4)
MCHC: 30.1 g/dL — AB (ref 32.0–36.0)
MCV: 99.2 fL — AB (ref 79.3–98.0)
MONO#: 0 10*3/uL — ABNORMAL LOW (ref 0.1–0.9)
MONO%: 3.4 % (ref 0.0–14.0)
NEUT#: 0.1 10*3/uL — CL (ref 1.5–6.5)
NEUT%: 7.9 % — AB (ref 39.0–75.0)
PLATELETS: 50 10*3/uL — AB (ref 140–400)
RBC: 2.48 10*6/uL — AB (ref 4.20–5.82)
RDW: 21.3 % — ABNORMAL HIGH (ref 11.0–14.6)
Retic %: 2.61 % — ABNORMAL HIGH (ref 0.80–1.80)
Retic Ct Abs: 64.73 10*3/uL (ref 34.80–93.90)
WBC: 0.9 10*3/uL — AB (ref 4.0–10.3)

## 2016-11-14 LAB — TECHNOLOGIST REVIEW

## 2016-11-15 ENCOUNTER — Other Ambulatory Visit: Payer: Self-pay | Admitting: *Deleted

## 2016-11-15 ENCOUNTER — Ambulatory Visit (HOSPITAL_COMMUNITY)
Admission: RE | Admit: 2016-11-15 | Discharge: 2016-11-15 | Disposition: A | Discharge status: Home / Self Care | Payer: Medicare Other | Source: Ambulatory Visit | Attending: Hematology | Admitting: Hematology

## 2016-11-15 DIAGNOSIS — D61818 Other pancytopenia: Secondary | ICD-10-CM

## 2016-11-15 DIAGNOSIS — D649 Anemia, unspecified: Secondary | ICD-10-CM

## 2016-11-15 DIAGNOSIS — D72819 Decreased white blood cell count, unspecified: Secondary | ICD-10-CM

## 2016-11-15 DIAGNOSIS — D469 Myelodysplastic syndrome, unspecified: Secondary | ICD-10-CM | POA: Insufficient documentation

## 2016-11-17 ENCOUNTER — Other Ambulatory Visit: Payer: Medicare Other

## 2016-11-17 ENCOUNTER — Other Ambulatory Visit: Payer: Self-pay | Admitting: *Deleted

## 2016-11-17 ENCOUNTER — Ambulatory Visit (HOSPITAL_BASED_OUTPATIENT_CLINIC_OR_DEPARTMENT_OTHER): Payer: Medicare Other

## 2016-11-17 DIAGNOSIS — D469 Myelodysplastic syndrome, unspecified: Secondary | ICD-10-CM

## 2016-11-17 DIAGNOSIS — D4622 Refractory anemia with excess of blasts 2: Secondary | ICD-10-CM | POA: Diagnosis present

## 2016-11-17 LAB — PREPARE RBC (CROSSMATCH)

## 2016-11-17 MED ORDER — DIPHENHYDRAMINE HCL 25 MG PO CAPS
25.0000 mg | ORAL_CAPSULE | Freq: Once | ORAL | Status: AC
  Administered 2016-11-17: 25 mg via ORAL

## 2016-11-17 MED ORDER — ACETAMINOPHEN 325 MG PO TABS
ORAL_TABLET | ORAL | Status: AC
  Filled 2016-11-17: qty 2

## 2016-11-17 MED ORDER — DIPHENHYDRAMINE HCL 25 MG PO CAPS
ORAL_CAPSULE | ORAL | Status: AC
  Filled 2016-11-17: qty 1

## 2016-11-17 MED ORDER — ACETAMINOPHEN 325 MG PO TABS
650.0000 mg | ORAL_TABLET | Freq: Once | ORAL | Status: AC
  Administered 2016-11-17: 650 mg via ORAL

## 2016-11-17 MED ORDER — SODIUM CHLORIDE 0.9 % IV SOLN
250.0000 mL | Freq: Once | INTRAVENOUS | Status: AC
  Administered 2016-11-17: 250 mL via INTRAVENOUS

## 2016-11-17 NOTE — Patient Instructions (Signed)
Blood Transfusion , Adult A blood transfusion is a procedure in which you receive donated blood, including plasma, platelets, and red blood cells, through an IV tube. You may need a blood transfusion because of illness, surgery, or injury. The blood may come from a donor. You may also be able to donate blood for yourself (autologous blood donation) before a surgery if you know that you might require a blood transfusion. The blood given in a transfusion is made up of different types of cells. You may receive:  Red blood cells. These carry oxygen to the cells in the body.  White blood cells. These help you fight infections.  Platelets. These help your blood to clot.  Plasma. This is the liquid part of your blood and it helps with fluid imbalances. If you have hemophilia or another clotting disorder, you may also receive other types of blood products. Tell a health care provider about:  Any allergies you have.  All medicines you are taking, including vitamins, herbs, eye drops, creams, and over-the-counter medicines.  Any problems you or family members have had with anesthetic medicines.  Any blood disorders you have.  Any surgeries you have had.  Any medical conditions you have, including any recent fever or cold symptoms.  Whether you are pregnant or may be pregnant.  Any previous reactions you have had during a blood transfusion. What are the risks? Generally, this is a safe procedure. However, problems may occur, including:  Having an allergic reaction to something in the donated blood. Hives and itching may be symptoms of this type of reaction.  Fever. This may be a reaction to the white blood cells in the transfused blood. Nausea or chest pain may accompany a fever.  Iron overload. This can happen from having many transfusions.  Transfusion-related acute lung injury (TRALI). This is a rare reaction that causes lung damage. The cause is not known.TRALI can occur within hours  of a transfusion or several days later.  Sudden (acute) or delayed hemolytic reactions. This happens if your blood does not match the cells in your transfusion. Your body's defense system (immune system) may try to attack the new cells. This complication is rare. The symptoms include fever, chills, nausea, and low back pain or chest pain.  Infection or disease transmission. This is rare. What happens before the procedure?  You will have a blood test to determine your blood type. This is necessary to know what kind of blood your body will accept and to match it to the donor blood.  If you are going to have a planned surgery, you may be able to do an autologous blood donation. This may be done in case you need to have a transfusion.  If you have had an allergic reaction to a transfusion in the past, you may be given medicine to help prevent a reaction. This medicine may be given to you by mouth or through an IV tube.  You will have your temperature, blood pressure, and pulse monitored before the transfusion.  Follow instructions from your health care provider about eating and drinking restrictions.  Ask your health care provider about:  Changing or stopping your regular medicines. This is especially important if you are taking diabetes medicines or blood thinners.  Taking medicines such as aspirin and ibuprofen. These medicines can thin your blood. Do not take these medicines before your procedure if your health care provider instructs you not to. What happens during the procedure?  An IV tube will be   inserted into one of your veins.  The bag of donated blood will be attached to your IV tube. The blood will then enter through your vein.  Your temperature, blood pressure, and pulse will be monitored regularly during the transfusion. This monitoring is done to detect early signs of a transfusion reaction.  If you have any signs or symptoms of a reaction, your transfusion will be stopped and  you may be given medicine.  When the transfusion is complete, your IV tube will be removed.  Pressure may be applied to the IV site for a few minutes.  A bandage (dressing) will be applied. The procedure may vary among health care providers and hospitals. What happens after the procedure?  Your temperature, blood pressure, heart rate, breathing rate, and blood oxygen level will be monitored often.  Your blood may be tested to see how you are responding to the transfusion.  You may be warmed with fluids or blankets to maintain a normal body temperature. Summary  A blood transfusion is a procedure in which you receive donated blood, including plasma, platelets, and red blood cells, through an IV tube.  Your temperature, blood pressure, and pulse will be monitored before, during, and after the transfusion.  Your blood may be tested after the transfusion to see how your body has responded. This information is not intended to replace advice given to you by your health care provider. Make sure you discuss any questions you have with your health care provider. Document Released: 11/11/2000 Document Revised: 08/11/2016 Document Reviewed: 08/11/2016 Elsevier Interactive Patient Education  2017 Elsevier Inc.  

## 2016-11-19 LAB — TYPE AND SCREEN
Blood Product Expiration Date: 201801182359
Blood Product Expiration Date: 201801182359
Blood Product Expiration Date: 201801182359
ISSUE DATE / TIME: 201712211300
ISSUE DATE / TIME: 201712211300
ISSUE DATE / TIME: 201712211558
Unit Type and Rh: 9500
Unit Type and Rh: 9500
Unit Type and Rh: 9500

## 2016-11-22 ENCOUNTER — Other Ambulatory Visit (HOSPITAL_BASED_OUTPATIENT_CLINIC_OR_DEPARTMENT_OTHER): Payer: Medicare Other

## 2016-11-22 DIAGNOSIS — D4622 Refractory anemia with excess of blasts 2: Secondary | ICD-10-CM | POA: Diagnosis present

## 2016-11-22 LAB — CBC & DIFF AND RETIC
BASO%: 0 % (ref 0.0–2.0)
BASOS ABS: 0 10*3/uL (ref 0.0–0.1)
EOS%: 0 % (ref 0.0–7.0)
Eosinophils Absolute: 0 10*3/uL (ref 0.0–0.5)
HEMATOCRIT: 30.5 % — AB (ref 38.4–49.9)
HGB: 9.7 g/dL — ABNORMAL LOW (ref 13.0–17.1)
Immature Retic Fract: 14.9 % — ABNORMAL HIGH (ref 3.00–10.60)
LYMPH%: 90.5 % — ABNORMAL HIGH (ref 14.0–49.0)
MCH: 30.6 pg (ref 27.2–33.4)
MCHC: 31.8 g/dL — AB (ref 32.0–36.0)
MCV: 96.2 fL (ref 79.3–98.0)
MONO#: 0.1 10*3/uL (ref 0.1–0.9)
MONO%: 6 % (ref 0.0–14.0)
NEUT#: 0 10*3/uL — CL (ref 1.5–6.5)
NEUT%: 3.5 % — ABNORMAL LOW (ref 39.0–75.0)
RBC: 3.17 10*6/uL — ABNORMAL LOW (ref 4.20–5.82)
RDW: 20.9 % — AB (ref 11.0–14.6)
RETIC %: 4.53 % — AB (ref 0.80–1.80)
Retic Ct Abs: 143.6 10*3/uL — ABNORMAL HIGH (ref 34.80–93.90)
WBC: 0.8 10*3/uL — CL (ref 4.0–10.3)
lymph#: 0.8 10*3/uL — ABNORMAL LOW (ref 0.9–3.3)

## 2016-11-22 LAB — TECHNOLOGIST REVIEW

## 2016-11-28 DIAGNOSIS — C9200 Acute myeloblastic leukemia, not having achieved remission: Secondary | ICD-10-CM

## 2016-11-28 HISTORY — DX: Acute myeloblastic leukemia, not having achieved remission: C92.00

## 2016-11-29 ENCOUNTER — Inpatient Hospital Stay: Payer: Medicare Other

## 2016-11-29 ENCOUNTER — Ambulatory Visit (HOSPITAL_BASED_OUTPATIENT_CLINIC_OR_DEPARTMENT_OTHER): Payer: Medicare Other | Admitting: Hematology

## 2016-11-29 ENCOUNTER — Encounter: Payer: Self-pay | Admitting: Hematology

## 2016-11-29 ENCOUNTER — Other Ambulatory Visit: Payer: Medicare Other

## 2016-11-29 VITALS — BP 164/71 | HR 73 | Temp 98.8°F | Resp 18 | Wt 164.2 lb

## 2016-11-29 DIAGNOSIS — D696 Thrombocytopenia, unspecified: Secondary | ICD-10-CM | POA: Diagnosis not present

## 2016-11-29 DIAGNOSIS — D649 Anemia, unspecified: Secondary | ICD-10-CM

## 2016-11-29 DIAGNOSIS — D4622 Refractory anemia with excess of blasts 2: Secondary | ICD-10-CM | POA: Diagnosis not present

## 2016-11-29 DIAGNOSIS — D709 Neutropenia, unspecified: Secondary | ICD-10-CM | POA: Diagnosis not present

## 2016-11-29 NOTE — Progress Notes (Signed)
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 Trisomy 8 (Intermediate risk) on FISH studies. R-ISS ( score 7- very high risk)  Current treatment Vidaza s/p 6 cycles Following at [facility] with Dr. Ok Edwards and Dr. Royce Macadamia for evaluation regarding transplant and other treatment options.  INTERVAL HISTORY  [name] is here for his scheduled follow-up for his very high risk MDS. He notes that he felt much better PRBC transfusion on 11/18/2016. He is set up for a bone marrow biopsy and labs at [facility] tomorrow and is scheduled to be admitted for his transplant on 12/10/2016. He notes a few petechiae on his palms which he had even previously . No bleeding. No fevers or chills.  MEDICAL HISTORY:  Past Medical History:  Diagnosis Date  . Cancer [facility]    Prostate, Melanoma- lt. Shoulder (no further problems since 2010  . Chronic diastolic CHF (congestive heart failure) ([place]) 09/13/2016  . Complication of anesthesia    versed gave the adverse reaction pt. ,became aggitated  . COPD (chronic obstructive pulmonary disease) (HCC)    Dr. Annamaria Boots follows  . Diffuse esophageal spasm   . Diverticulosis   . DJD (degenerative joint disease)   . Multiple trauma     horse accident 2003 L SHOULDER  . Oral herpes   . OSA (obstructive sleep apnea)    mild, rare cpap use  . Peripheral neuropathy ([place])   . Pulmonary nodule   . Spinal stenosis   . Systolic hypertension   Recurrent HSV related cold sores. Acoustic neuroma left 1999 - s/p surgery. Pruritus ani Injury in 2003 related to a horse accident resulting in hydrothorax, pneumothorax, fractured ribs, dislocated left shoulder, rotator cuff  tear on the left, left elbow damage and left ulnar nerve damage.   SURGICAL HISTORY: Past Surgical History:  Procedure Laterality Date  . ,laceration to head in accident with hourse    . ACOUSTIC NEUROMA LEFT EAR  1999  . BALLOON DILATION N/A 11/25/2014   Procedure: BALLOON DILATION;  Surgeon: Garlan Fair, MD;  Location: [facility];  Service: Endoscopy;  Laterality: N/A;  . COLONOSCOPY WITH PROPOFOL N/A 10/01/2013   Procedure: COLONOSCOPY WITH PROPOFOL;  Surgeon: Garlan Fair, MD;  Location: [facility];  Service: Endoscopy;  Laterality: N/A;  . ESOPHAGOGASTRODUODENOSCOPY (EGD) WITH PROPOFOL N/A 11/25/2014   Procedure: ESOPHAGOGASTRODUODENOSCOPY (EGD) WITH PROPOFOL;  Surgeon: Garlan Fair, MD;  Location: [facility] END[facility]COPY;  Service: Endoscopy;  Laterality: N/A;  . EYE SURGERY     cosmetic surgery"brow lift" 4'15  . MULTIPLE PROCEDURES FOR LEFT ARM     ELBOW,RIB FX PNEUMOTHORAX AFTER FALLING OFF MOUNTAIN  . PILONIDAL CYST / SINUS EXCISION    . RADICAL PROSTATECTOMY    . TEE WITHOUT CARDIOVERSION N/A 09/20/2016   Procedure: TRANSESOPHAGEAL ECHOCARDIOGRAM (TEE);  Surgeon: Jerline Pain, MD;  Location: [facility] ENDOSCOPY;  Service: Cardiovascular;  Laterality: N/A;    SOCIAL HISTORY: Social History   Social History  . Marital status: Married    Spouse name: N/A  . Number of children: N/A  . Years of education: N/A   Occupational History  . Not on file.   Social History Main Topics  . Smoking status: Former Smoker  Packs/day: 1.00    Years: 40.00    Quit date: 11/29/1995  . Smokeless tobacco: Never Used  . Alcohol use Yes     Comment: 1 drink per day  . Drug use: No  . Sexual activity: Not on file   Other Topics Concern  . Not on file   Social History Narrative   CPAP 8 advanced   Patient drinks 5 cups of caffeine daily   Exercises 3-4 times weekly   Son is a pulmonologist    FAMILY HISTORY: Family History  Problem Relation Age of Onset  . Emphysema Father     . Asthma Father   . Lung cancer Father   . Breast cancer Sister   . Breast cancer Sister   . Skin cancer Brother   . Prostate cancer Brother    Brother with a recent diagnosis of hemachromatosis Brother with CLL/SLL Mother had anemia, thalassemia. Hepatitis C with liver cancer  ALLERGIES:  is allergic to fluconazole and midazolam.  MEDICATIONS:  Current Outpatient Prescriptions  Medication Sig Dispense Refill  . calcium carbonate (TUMS - DOSED IN MG ELEMENTAL CALCIUM) 500 MG chewable tablet Chew 1 tablet by mouth at bedtime.     . chlorhexidine (PERIDEX) 0.12 % solution RINSE 10 ML (CC) IN MOUTH TWICE DAILY AS DIRECTED **SWISH  AND  SPIT,  DO  NOT  SWALLOW** 473 mL 1  . ciprofloxacin (CIPRO) 500 MG tablet Take 1 tablet (500 mg total) by mouth 2 (two) times daily. 60 tablet 1  . doxycycline (VIBRAMYCIN) 100 MG capsule Take 1 capsule (100 mg total) by mouth 2 (two) times daily. 60 capsule 11  . magic mouthwash SOLN Take 5 mLs by mouth 4 (four) times daily as needed for mouth pain. 480 mL 0  . Multiple Vitamin (MULTIVITAMIN WITH MINERALS) TABS tablet Take 1 tablet by mouth daily.    . nitroGLYCERIN (NITROSTAT) 0.4 MG SL tablet Place 1 tablet (0.4 mg total) under the tongue every 5 (five) minutes as needed for chest pain. 25 tablet 1  . omeprazole (PRILOSEC) 40 MG capsule Take 40 mg by mouth daily before lunch. 1/2 hour before.    . ondansetron (ZOFRAN) 4 MG tablet Take 4 mg by mouth every 8 (eight) hours as needed for nausea or vomiting.     . posaconazole (NOXAFIL) 100 MG TBEC delayed-release tablet Take 3 tablets (300 mg total) by mouth daily. (Patient taking differently: Take 300 mg by mouth at bedtime. ) 90 tablet 0  . senna-docusate (SENNA S) 8.6-50 MG tablet Take 2 tablets by mouth 2 (two) times daily as needed for mild constipation or moderate constipation.    . valACYclovir (VALTREX) 500 MG tablet TAKE ONE TABLET BY MOUTH TWICE DAILY AT  NIGHT  TO  SUPPRESS  HERPES  GINGIVITIS 60  tablet 1  . valsartan (DIOVAN) 80 MG tablet Take 40 mg by mouth at bedtime.      No current facility-administered medications for this visit.     REVIEW OF SYSTEMS:    10 Point review of Systems was done is negative except as noted above.  PHYSICAL EXAMINATION: ECOG PERFORMANCE STATUS: 1 - Symptomatic but completely ambulatory  . Vitals:   11/29/16 1204  BP: (!) 164/71  Pulse: 73  Resp: 18  Temp: 98.8 F (37.1 C)   Filed Weights   11/29/16 1204  Weight: 164 lb 3.2 oz (74.5 kg)   .Body mass index is 25.72 kg/m.  GENERAL:alert, in no acute distress and comfortable SKIN:  Resolving rash on his upper extremities EYES: normal, conjunctiva are pink and non-injected, sclera clear OROPHARYNX:no exudate, no erythema and lips, buccal mucosa, and tongue normal  NECK: supple, no JVD, thyroid normal size, non-tender, without nodularity LYMPH:  no palpable lymphadenopathy in the cervical, axillary or inguinal LUNGS: clear to auscultation With somewhat decreased entry bilaterally, no wheezing HEART: regular rate & rhythm,  no murmurs and no lower extremity edema ABDOMEN: abdomen soft, non-tender, normoactive bowel sounds , no palpable hepatosplenomegaly Musculoskeletal: no cyanosis of digits. PSYCH: alert & oriented x 3 with fluent speech NEURO: no focal motor deficits.  LABORATORY DATA:  I have reviewed the data as listed  . CBC Latest Ref Rng & Units 11/22/2016 11/14/2016 11/07/2016  WBC 4.0 - 10.3 10e3/uL 0.8(LL) 0.9(LL) 0.8(LL)  Hemoglobin 13.0 - 17.1 g/dL 9.7(L) 7.4(L) 7.8(L)  Hematocrit 38.4 - 49.9 % 30.5(L) 24.6(L) 25.8(L)  Platelets 140 - 400 10e3/uL 45 Platelet count confirmed by slide estimate(L) 50(L) 53(L)   . CMP Latest Ref Rng & Units 11/07/2016 10/31/2016 10/24/2016  Glucose 70 - 140 mg/dl 100 98 127  BUN 7.0 - 26.0 mg/dL 18.7 15.3 17.5  Creatinine 0.7 - 1.3 mg/dL 1.1 0.9 0.9  Sodium 136 - 145 mEq/L 142 140 140  Potassium 3.5 - 5.1 mEq/L 4.1 4.0 3.7  Chloride  101 - 111 mmol/L - - -  CO2 22 - 29 mEq/L '25 24 27  '$ Calcium 8.4 - 10.4 mg/dL 8.3(L) 8.8 8.6  Total Protein 6.4 - 8.3 g/dL 6.4 6.5 6.4  Total Bilirubin 0.20 - 1.20 mg/dL 2.07(H) 1.96(H) 1.64(H)  Alkaline Phos 40 - 150 U/L 74 79 76  AST 5 - 34 U/L 46(H) 57(H) 72(H)  ALT 0 - 55 U/L '28 19 18      '$ ROSS AND MICROSCOPIC INFORMATION RADIOGRAPHIC STUDIES: I have personally reviewed the radiological images as listed and agreed with the findings in the report. No results found.  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 Patient has normal platelet counts at this time. Foundation one results reviewed patient has TET2, ASXL1, BCOR RUNX1 SETBP1 SRSF2 and STAG2 mutations identified. Repeat bone marrow suggested possibly some decrease in his blasts counts 7% by flow cytometry.  2) Anemia -  Improved after PRBC transfusion x 2 units for hemoglobin of 7.4. He notes that he felt much better after the PRBC transfusion.  3) Neutropenia . Patient continues to have an [place] of 0-100 range. This appears to be primarily related to his RAEB 2. His neutrophil count hasn't bounced back to being off Vidaza for a while which confirms that this is likely related to his underlying disease process . Was on antimicrobial prophylaxis with ciprofloxacin.  3) s/p MSSA bacteremia with septic emboli to the lung. No evidence of infective endocarditis on TEE.  patient completed his planned  IV cefazolin as per infectious disease. He has been switched to oral doxycycline for ongoing prophylaxis. He continues to be on ciprofloxacin for neutropenic prophylaxis and posaconazole for antifungal prophylaxis.  4) thrombocytopenia- platelets count in 40-50k range.  Probably has some element of platelet dysfunction due to his MDS. PLAN -Patient is scheduled for repeat bone marrow biopsy and labs at [facility] with his transplant team tomorrow. -He reports that he has been scheduled  to be admitted for his RIC -[facility]o on 12/11/2015 with an URD from [place]  -His transplant team at [facility] will direct his posttransplant cares. -continue current antimicrobial prophylaxis. With ciprofloxacin for neutropenic prophylaxis at this time. -Continue posaconazole for antifungal prophylaxis.  Continue Acyclovir. -Continues to be on doxycycline for MSSA maintenance therapy as per Dr. Donell Sievert at [facility] --we will hold off on ordering additional labs and Vidaza at this time.  Return to care with Dr. Irene Limbo in 4-6 months after post-transplantation. Primary Oncologic cares to be driven by his transplant team at [facility] at this time.   I spent 25 minutes counseling the patient face to face. The total time spent in the appointment was 30 minutes and more than 50% was on counseling and direct patient cares    Sullivan Lone MD [place] AAHIVMS [place] [facility] Hematology/Oncology Physician [facility]  (Office):       [phone] (Work cell):  [phone] (Fax):           [phone]

## 2016-11-30 ENCOUNTER — Inpatient Hospital Stay: Payer: Medicare Other

## 2016-11-30 DIAGNOSIS — Z005 Encounter for examination of potential donor of organ and tissue: Secondary | ICD-10-CM | POA: Diagnosis not present

## 2016-11-30 DIAGNOSIS — D469 Myelodysplastic syndrome, unspecified: Secondary | ICD-10-CM | POA: Diagnosis not present

## 2016-11-30 DIAGNOSIS — Z7682 Awaiting organ transplant status: Secondary | ICD-10-CM | POA: Diagnosis not present

## 2016-12-01 ENCOUNTER — Inpatient Hospital Stay: Payer: Medicare Other

## 2016-12-02 ENCOUNTER — Inpatient Hospital Stay: Payer: Medicare Other

## 2016-12-02 DIAGNOSIS — G4733 Obstructive sleep apnea (adult) (pediatric): Secondary | ICD-10-CM | POA: Diagnosis not present

## 2016-12-02 DIAGNOSIS — D46Z Other myelodysplastic syndromes: Secondary | ICD-10-CM | POA: Diagnosis not present

## 2016-12-02 DIAGNOSIS — I313 Pericardial effusion (noninflammatory): Secondary | ICD-10-CM | POA: Diagnosis not present

## 2016-12-02 DIAGNOSIS — Z87891 Personal history of nicotine dependence: Secondary | ICD-10-CM | POA: Diagnosis not present

## 2016-12-02 DIAGNOSIS — I517 Cardiomegaly: Secondary | ICD-10-CM | POA: Diagnosis not present

## 2016-12-02 DIAGNOSIS — I272 Pulmonary hypertension, unspecified: Secondary | ICD-10-CM | POA: Diagnosis not present

## 2016-12-02 DIAGNOSIS — Z888 Allergy status to other drugs, medicaments and biological substances status: Secondary | ICD-10-CM | POA: Diagnosis not present

## 2016-12-02 DIAGNOSIS — Z801 Family history of malignant neoplasm of trachea, bronchus and lung: Secondary | ICD-10-CM | POA: Diagnosis not present

## 2016-12-02 DIAGNOSIS — I340 Nonrheumatic mitral (valve) insufficiency: Secondary | ICD-10-CM | POA: Diagnosis not present

## 2016-12-02 DIAGNOSIS — Z8042 Family history of malignant neoplasm of prostate: Secondary | ICD-10-CM | POA: Diagnosis not present

## 2016-12-02 DIAGNOSIS — Z808 Family history of malignant neoplasm of other organs or systems: Secondary | ICD-10-CM | POA: Diagnosis not present

## 2016-12-02 DIAGNOSIS — M1990 Unspecified osteoarthritis, unspecified site: Secondary | ICD-10-CM | POA: Diagnosis not present

## 2016-12-02 DIAGNOSIS — Z9221 Personal history of antineoplastic chemotherapy: Secondary | ICD-10-CM | POA: Diagnosis not present

## 2016-12-02 DIAGNOSIS — D709 Neutropenia, unspecified: Secondary | ICD-10-CM | POA: Diagnosis not present

## 2016-12-02 DIAGNOSIS — Z8781 Personal history of (healed) traumatic fracture: Secondary | ICD-10-CM | POA: Diagnosis not present

## 2016-12-02 DIAGNOSIS — Z8546 Personal history of malignant neoplasm of prostate: Secondary | ICD-10-CM | POA: Diagnosis not present

## 2016-12-02 DIAGNOSIS — I10 Essential (primary) hypertension: Secondary | ICD-10-CM | POA: Diagnosis not present

## 2016-12-02 DIAGNOSIS — I519 Heart disease, unspecified: Secondary | ICD-10-CM | POA: Diagnosis not present

## 2016-12-02 DIAGNOSIS — Z6825 Body mass index (BMI) 25.0-25.9, adult: Secondary | ICD-10-CM | POA: Diagnosis not present

## 2016-12-02 DIAGNOSIS — Z005 Encounter for examination of potential donor of organ and tissue: Secondary | ICD-10-CM | POA: Diagnosis not present

## 2016-12-02 DIAGNOSIS — Z79899 Other long term (current) drug therapy: Secondary | ICD-10-CM | POA: Diagnosis not present

## 2016-12-02 DIAGNOSIS — I361 Nonrheumatic tricuspid (valve) insufficiency: Secondary | ICD-10-CM | POA: Diagnosis not present

## 2016-12-02 DIAGNOSIS — Z7682 Awaiting organ transplant status: Secondary | ICD-10-CM | POA: Diagnosis not present

## 2016-12-05 ENCOUNTER — Inpatient Hospital Stay: Payer: Medicare Other

## 2016-12-05 DIAGNOSIS — Z6825 Body mass index (BMI) 25.0-25.9, adult: Secondary | ICD-10-CM | POA: Diagnosis not present

## 2016-12-05 DIAGNOSIS — C9200 Acute myeloblastic leukemia, not having achieved remission: Secondary | ICD-10-CM | POA: Diagnosis not present

## 2016-12-06 ENCOUNTER — Encounter: Payer: Self-pay | Admitting: *Deleted

## 2016-12-06 ENCOUNTER — Inpatient Hospital Stay: Payer: Medicare Other

## 2016-12-06 ENCOUNTER — Other Ambulatory Visit: Payer: Self-pay | Admitting: *Deleted

## 2016-12-06 DIAGNOSIS — D469 Myelodysplastic syndrome, unspecified: Secondary | ICD-10-CM

## 2016-12-07 ENCOUNTER — Other Ambulatory Visit (HOSPITAL_BASED_OUTPATIENT_CLINIC_OR_DEPARTMENT_OTHER): Payer: Medicare Other

## 2016-12-07 ENCOUNTER — Inpatient Hospital Stay: Payer: Medicare Other

## 2016-12-07 ENCOUNTER — Ambulatory Visit: Payer: Medicare Other | Admitting: Internal Medicine

## 2016-12-07 DIAGNOSIS — D4622 Refractory anemia with excess of blasts 2: Secondary | ICD-10-CM | POA: Diagnosis present

## 2016-12-07 DIAGNOSIS — D469 Myelodysplastic syndrome, unspecified: Secondary | ICD-10-CM

## 2016-12-07 LAB — CBC & DIFF AND RETIC
BASO%: 0 % (ref 0.0–2.0)
Basophils Absolute: 0 10e3/uL (ref 0.0–0.1)
EOS%: 1.1 % (ref 0.0–7.0)
Eosinophils Absolute: 0 10e3/uL (ref 0.0–0.5)
HCT: 28.4 % — ABNORMAL LOW (ref 38.4–49.9)
HGB: 8.8 g/dL — ABNORMAL LOW (ref 13.0–17.1)
Immature Retic Fract: 13.8 % — ABNORMAL HIGH (ref 3.00–10.60)
LYMPH%: 89 % — ABNORMAL HIGH (ref 14.0–49.0)
MCH: 30.2 pg (ref 27.2–33.4)
MCHC: 31 g/dL — ABNORMAL LOW (ref 32.0–36.0)
MCV: 97.6 fL (ref 79.3–98.0)
MONO#: 0.1 10e3/uL (ref 0.1–0.9)
MONO%: 6.6 % (ref 0.0–14.0)
NEUT#: 0 10e3/uL — CL (ref 1.5–6.5)
NEUT%: 3.3 % — ABNORMAL LOW (ref 39.0–75.0)
Platelets: 57 10e3/uL — ABNORMAL LOW (ref 140–400)
RBC: 2.91 10e6/uL — ABNORMAL LOW (ref 4.20–5.82)
RDW: 20.9 % — ABNORMAL HIGH (ref 11.0–14.6)
Retic %: 2.91 % — ABNORMAL HIGH (ref 0.80–1.80)
Retic Ct Abs: 84.68 10e3/uL (ref 34.80–93.90)
WBC: 0.9 10e3/uL — CL (ref 4.0–10.3)
lymph#: 0.8 10e3/uL — ABNORMAL LOW (ref 0.9–3.3)

## 2016-12-07 LAB — TECHNOLOGIST REVIEW

## 2016-12-13 DIAGNOSIS — B009 Herpesviral infection, unspecified: Secondary | ICD-10-CM | POA: Diagnosis not present

## 2016-12-13 DIAGNOSIS — C92Z0 Other myeloid leukemia not having achieved remission: Secondary | ICD-10-CM | POA: Diagnosis present

## 2016-12-13 DIAGNOSIS — Z9481 Bone marrow transplant status: Secondary | ICD-10-CM | POA: Diagnosis not present

## 2016-12-13 DIAGNOSIS — T80211A Bloodstream infection due to central venous catheter, initial encounter: Secondary | ICD-10-CM | POA: Diagnosis not present

## 2016-12-13 DIAGNOSIS — K5900 Constipation, unspecified: Secondary | ICD-10-CM | POA: Diagnosis not present

## 2016-12-13 DIAGNOSIS — R0781 Pleurodynia: Secondary | ICD-10-CM | POA: Diagnosis not present

## 2016-12-13 DIAGNOSIS — R918 Other nonspecific abnormal finding of lung field: Secondary | ICD-10-CM | POA: Diagnosis not present

## 2016-12-13 DIAGNOSIS — J811 Chronic pulmonary edema: Secondary | ICD-10-CM | POA: Diagnosis not present

## 2016-12-13 DIAGNOSIS — R609 Edema, unspecified: Secondary | ICD-10-CM | POA: Diagnosis not present

## 2016-12-13 DIAGNOSIS — Z5111 Encounter for antineoplastic chemotherapy: Secondary | ICD-10-CM | POA: Diagnosis not present

## 2016-12-13 DIAGNOSIS — R0789 Other chest pain: Secondary | ICD-10-CM | POA: Diagnosis not present

## 2016-12-13 DIAGNOSIS — R7881 Bacteremia: Secondary | ICD-10-CM | POA: Diagnosis not present

## 2016-12-13 DIAGNOSIS — R51 Headache: Secondary | ICD-10-CM | POA: Diagnosis not present

## 2016-12-13 DIAGNOSIS — J984 Other disorders of lung: Secondary | ICD-10-CM | POA: Diagnosis not present

## 2016-12-13 DIAGNOSIS — I517 Cardiomegaly: Secondary | ICD-10-CM | POA: Diagnosis not present

## 2016-12-13 DIAGNOSIS — R5081 Fever presenting with conditions classified elsewhere: Secondary | ICD-10-CM | POA: Diagnosis not present

## 2016-12-13 DIAGNOSIS — Z87891 Personal history of nicotine dependence: Secondary | ICD-10-CM | POA: Diagnosis not present

## 2016-12-13 DIAGNOSIS — J189 Pneumonia, unspecified organism: Secondary | ICD-10-CM | POA: Diagnosis not present

## 2016-12-13 DIAGNOSIS — T451X5A Adverse effect of antineoplastic and immunosuppressive drugs, initial encounter: Secondary | ICD-10-CM | POA: Diagnosis present

## 2016-12-13 DIAGNOSIS — I10 Essential (primary) hypertension: Secondary | ICD-10-CM | POA: Diagnosis not present

## 2016-12-13 DIAGNOSIS — M7989 Other specified soft tissue disorders: Secondary | ICD-10-CM | POA: Diagnosis not present

## 2016-12-13 DIAGNOSIS — A4901 Methicillin susceptible Staphylococcus aureus infection, unspecified site: Secondary | ICD-10-CM | POA: Diagnosis not present

## 2016-12-13 DIAGNOSIS — I491 Atrial premature depolarization: Secondary | ICD-10-CM | POA: Diagnosis not present

## 2016-12-13 DIAGNOSIS — I313 Pericardial effusion (noninflammatory): Secondary | ICD-10-CM | POA: Diagnosis not present

## 2016-12-13 DIAGNOSIS — G4733 Obstructive sleep apnea (adult) (pediatric): Secondary | ICD-10-CM | POA: Diagnosis present

## 2016-12-13 DIAGNOSIS — H5712 Ocular pain, left eye: Secondary | ICD-10-CM | POA: Diagnosis not present

## 2016-12-13 DIAGNOSIS — I471 Supraventricular tachycardia: Secondary | ICD-10-CM | POA: Diagnosis not present

## 2016-12-13 DIAGNOSIS — R42 Dizziness and giddiness: Secondary | ICD-10-CM | POA: Diagnosis not present

## 2016-12-13 DIAGNOSIS — C92A0 Acute myeloid leukemia with multilineage dysplasia, not having achieved remission: Secondary | ICD-10-CM | POA: Diagnosis not present

## 2016-12-13 DIAGNOSIS — M4802 Spinal stenosis, cervical region: Secondary | ICD-10-CM | POA: Diagnosis not present

## 2016-12-13 DIAGNOSIS — A411 Sepsis due to other specified staphylococcus: Secondary | ICD-10-CM | POA: Diagnosis not present

## 2016-12-13 DIAGNOSIS — R4182 Altered mental status, unspecified: Secondary | ICD-10-CM | POA: Diagnosis not present

## 2016-12-13 DIAGNOSIS — R091 Pleurisy: Secondary | ICD-10-CM | POA: Diagnosis not present

## 2016-12-13 DIAGNOSIS — R110 Nausea: Secondary | ICD-10-CM | POA: Diagnosis not present

## 2016-12-13 DIAGNOSIS — K219 Gastro-esophageal reflux disease without esophagitis: Secondary | ICD-10-CM | POA: Diagnosis not present

## 2016-12-13 DIAGNOSIS — R509 Fever, unspecified: Secondary | ICD-10-CM | POA: Diagnosis not present

## 2016-12-13 DIAGNOSIS — D469 Myelodysplastic syndrome, unspecified: Secondary | ICD-10-CM | POA: Diagnosis not present

## 2016-12-13 DIAGNOSIS — C9200 Acute myeloblastic leukemia, not having achieved remission: Secondary | ICD-10-CM | POA: Diagnosis not present

## 2016-12-13 DIAGNOSIS — N179 Acute kidney failure, unspecified: Secondary | ICD-10-CM | POA: Diagnosis not present

## 2016-12-13 DIAGNOSIS — R0600 Dyspnea, unspecified: Secondary | ICD-10-CM | POA: Diagnosis not present

## 2016-12-13 DIAGNOSIS — Z452 Encounter for adjustment and management of vascular access device: Secondary | ICD-10-CM | POA: Diagnosis not present

## 2016-12-13 DIAGNOSIS — J9811 Atelectasis: Secondary | ICD-10-CM | POA: Diagnosis not present

## 2016-12-13 DIAGNOSIS — R590 Localized enlarged lymph nodes: Secondary | ICD-10-CM | POA: Diagnosis not present

## 2016-12-13 DIAGNOSIS — I493 Ventricular premature depolarization: Secondary | ICD-10-CM | POA: Diagnosis not present

## 2016-12-13 DIAGNOSIS — D61818 Other pancytopenia: Secondary | ICD-10-CM | POA: Diagnosis not present

## 2016-12-13 DIAGNOSIS — Z9889 Other specified postprocedural states: Secondary | ICD-10-CM | POA: Diagnosis not present

## 2016-12-13 DIAGNOSIS — J90 Pleural effusion, not elsewhere classified: Secondary | ICD-10-CM | POA: Diagnosis not present

## 2016-12-13 DIAGNOSIS — Z8603 Personal history of neoplasm of uncertain behavior: Secondary | ICD-10-CM | POA: Diagnosis not present

## 2016-12-13 DIAGNOSIS — J9851 Mediastinitis: Secondary | ICD-10-CM | POA: Diagnosis not present

## 2016-12-13 DIAGNOSIS — R9431 Abnormal electrocardiogram [ECG] [EKG]: Secondary | ICD-10-CM | POA: Diagnosis not present

## 2016-12-13 DIAGNOSIS — R0602 Shortness of breath: Secondary | ICD-10-CM | POA: Diagnosis not present

## 2016-12-13 DIAGNOSIS — D61810 Antineoplastic chemotherapy induced pancytopenia: Secondary | ICD-10-CM | POA: Diagnosis not present

## 2016-12-13 DIAGNOSIS — B957 Other staphylococcus as the cause of diseases classified elsewhere: Secondary | ICD-10-CM | POA: Diagnosis not present

## 2016-12-13 DIAGNOSIS — D709 Neutropenia, unspecified: Secondary | ICD-10-CM | POA: Diagnosis not present

## 2016-12-19 ENCOUNTER — Other Ambulatory Visit: Payer: Self-pay | Admitting: *Deleted

## 2016-12-19 ENCOUNTER — Encounter: Payer: Self-pay | Admitting: *Deleted

## 2016-12-19 NOTE — Patient Outreach (Signed)
Case closure. Pt is currently an inpatient at [facility] for induction of chemotherapy for AML. It is anticipated that he will be there for 4-6 weeks. He is hopeful that he will have positive results so that he can get back on the STEM CELL transplant track.  I am closing his case for care management at this time. I have advised that [facility] will be available to him in the future should he need our services.  [name] [facility] [phone]

## 2017-01-28 DIAGNOSIS — I10 Essential (primary) hypertension: Secondary | ICD-10-CM | POA: Diagnosis not present

## 2017-01-28 DIAGNOSIS — C9200 Acute myeloblastic leukemia, not having achieved remission: Secondary | ICD-10-CM | POA: Diagnosis not present

## 2017-01-28 DIAGNOSIS — Z85820 Personal history of malignant melanoma of skin: Secondary | ICD-10-CM | POA: Diagnosis not present

## 2017-01-28 DIAGNOSIS — G4733 Obstructive sleep apnea (adult) (pediatric): Secondary | ICD-10-CM | POA: Diagnosis not present

## 2017-01-28 DIAGNOSIS — T451X5D Adverse effect of antineoplastic and immunosuppressive drugs, subsequent encounter: Secondary | ICD-10-CM | POA: Diagnosis not present

## 2017-01-28 DIAGNOSIS — J984 Other disorders of lung: Secondary | ICD-10-CM | POA: Diagnosis not present

## 2017-01-28 DIAGNOSIS — Z87891 Personal history of nicotine dependence: Secondary | ICD-10-CM | POA: Diagnosis not present

## 2017-01-28 DIAGNOSIS — D46Z Other myelodysplastic syndromes: Secondary | ICD-10-CM | POA: Diagnosis not present

## 2017-01-28 DIAGNOSIS — Z452 Encounter for adjustment and management of vascular access device: Secondary | ICD-10-CM | POA: Diagnosis not present

## 2017-01-28 DIAGNOSIS — Z8546 Personal history of malignant neoplasm of prostate: Secondary | ICD-10-CM | POA: Diagnosis not present

## 2017-01-28 DIAGNOSIS — D701 Agranulocytosis secondary to cancer chemotherapy: Secondary | ICD-10-CM | POA: Diagnosis not present

## 2017-01-30 DIAGNOSIS — D469 Myelodysplastic syndrome, unspecified: Secondary | ICD-10-CM | POA: Diagnosis not present

## 2017-01-30 DIAGNOSIS — Z79899 Other long term (current) drug therapy: Secondary | ICD-10-CM | POA: Diagnosis not present

## 2017-01-30 DIAGNOSIS — C9200 Acute myeloblastic leukemia, not having achieved remission: Secondary | ICD-10-CM | POA: Diagnosis not present

## 2017-02-01 DIAGNOSIS — R110 Nausea: Secondary | ICD-10-CM | POA: Diagnosis not present

## 2017-02-01 DIAGNOSIS — B370 Candidal stomatitis: Secondary | ICD-10-CM | POA: Diagnosis not present

## 2017-02-01 DIAGNOSIS — R432 Parageusia: Secondary | ICD-10-CM | POA: Diagnosis not present

## 2017-02-01 DIAGNOSIS — R195 Other fecal abnormalities: Secondary | ICD-10-CM | POA: Diagnosis not present

## 2017-02-01 DIAGNOSIS — Z6824 Body mass index (BMI) 24.0-24.9, adult: Secondary | ICD-10-CM | POA: Diagnosis not present

## 2017-02-01 DIAGNOSIS — Z79899 Other long term (current) drug therapy: Secondary | ICD-10-CM | POA: Diagnosis not present

## 2017-02-01 DIAGNOSIS — Z9221 Personal history of antineoplastic chemotherapy: Secondary | ICD-10-CM | POA: Diagnosis not present

## 2017-02-01 DIAGNOSIS — C92A0 Acute myeloid leukemia with multilineage dysplasia, not having achieved remission: Secondary | ICD-10-CM | POA: Diagnosis not present

## 2017-02-01 DIAGNOSIS — C9201 Acute myeloblastic leukemia, in remission: Secondary | ICD-10-CM | POA: Diagnosis not present

## 2017-02-01 DIAGNOSIS — Z8619 Personal history of other infectious and parasitic diseases: Secondary | ICD-10-CM | POA: Diagnosis not present

## 2017-02-01 DIAGNOSIS — Z792 Long term (current) use of antibiotics: Secondary | ICD-10-CM | POA: Diagnosis not present

## 2017-02-02 DIAGNOSIS — D46Z Other myelodysplastic syndromes: Secondary | ICD-10-CM | POA: Diagnosis not present

## 2017-02-02 DIAGNOSIS — C9200 Acute myeloblastic leukemia, not having achieved remission: Secondary | ICD-10-CM | POA: Diagnosis not present

## 2017-02-03 DIAGNOSIS — D469 Myelodysplastic syndrome, unspecified: Secondary | ICD-10-CM | POA: Diagnosis not present

## 2017-02-03 DIAGNOSIS — C9200 Acute myeloblastic leukemia, not having achieved remission: Secondary | ICD-10-CM | POA: Diagnosis not present

## 2017-02-06 DIAGNOSIS — I10 Essential (primary) hypertension: Secondary | ICD-10-CM | POA: Diagnosis not present

## 2017-02-06 DIAGNOSIS — D46Z Other myelodysplastic syndromes: Secondary | ICD-10-CM | POA: Diagnosis not present

## 2017-02-06 DIAGNOSIS — Z87891 Personal history of nicotine dependence: Secondary | ICD-10-CM | POA: Diagnosis not present

## 2017-02-06 DIAGNOSIS — R0602 Shortness of breath: Secondary | ICD-10-CM | POA: Diagnosis not present

## 2017-02-06 DIAGNOSIS — Z6824 Body mass index (BMI) 24.0-24.9, adult: Secondary | ICD-10-CM | POA: Diagnosis not present

## 2017-02-06 DIAGNOSIS — C9200 Acute myeloblastic leukemia, not having achieved remission: Secondary | ICD-10-CM | POA: Diagnosis not present

## 2017-02-06 DIAGNOSIS — Z79899 Other long term (current) drug therapy: Secondary | ICD-10-CM | POA: Diagnosis not present

## 2017-02-06 DIAGNOSIS — Z9221 Personal history of antineoplastic chemotherapy: Secondary | ICD-10-CM | POA: Diagnosis not present

## 2017-02-06 DIAGNOSIS — D696 Thrombocytopenia, unspecified: Secondary | ICD-10-CM | POA: Diagnosis not present

## 2017-02-06 DIAGNOSIS — G4733 Obstructive sleep apnea (adult) (pediatric): Secondary | ICD-10-CM | POA: Diagnosis not present

## 2017-02-07 DIAGNOSIS — D46Z Other myelodysplastic syndromes: Secondary | ICD-10-CM | POA: Diagnosis not present

## 2017-02-07 DIAGNOSIS — T451X5D Adverse effect of antineoplastic and immunosuppressive drugs, subsequent encounter: Secondary | ICD-10-CM | POA: Diagnosis not present

## 2017-02-07 DIAGNOSIS — D701 Agranulocytosis secondary to cancer chemotherapy: Secondary | ICD-10-CM | POA: Diagnosis not present

## 2017-02-07 DIAGNOSIS — I10 Essential (primary) hypertension: Secondary | ICD-10-CM | POA: Diagnosis not present

## 2017-02-07 DIAGNOSIS — C9200 Acute myeloblastic leukemia, not having achieved remission: Secondary | ICD-10-CM | POA: Diagnosis not present

## 2017-02-07 DIAGNOSIS — Z452 Encounter for adjustment and management of vascular access device: Secondary | ICD-10-CM | POA: Diagnosis not present

## 2017-02-08 DIAGNOSIS — C9200 Acute myeloblastic leukemia, not having achieved remission: Secondary | ICD-10-CM | POA: Diagnosis not present

## 2017-02-09 DIAGNOSIS — R918 Other nonspecific abnormal finding of lung field: Secondary | ICD-10-CM | POA: Diagnosis not present

## 2017-02-09 DIAGNOSIS — C9200 Acute myeloblastic leukemia, not having achieved remission: Secondary | ICD-10-CM | POA: Diagnosis not present

## 2017-02-10 DIAGNOSIS — C9200 Acute myeloblastic leukemia, not having achieved remission: Secondary | ICD-10-CM | POA: Diagnosis not present

## 2017-02-10 DIAGNOSIS — D469 Myelodysplastic syndrome, unspecified: Secondary | ICD-10-CM | POA: Diagnosis not present

## 2017-02-13 DIAGNOSIS — C9200 Acute myeloblastic leukemia, not having achieved remission: Secondary | ICD-10-CM | POA: Diagnosis not present

## 2017-02-14 DIAGNOSIS — T451X5D Adverse effect of antineoplastic and immunosuppressive drugs, subsequent encounter: Secondary | ICD-10-CM | POA: Diagnosis not present

## 2017-02-14 DIAGNOSIS — D46Z Other myelodysplastic syndromes: Secondary | ICD-10-CM | POA: Diagnosis not present

## 2017-02-14 DIAGNOSIS — C9200 Acute myeloblastic leukemia, not having achieved remission: Secondary | ICD-10-CM | POA: Diagnosis not present

## 2017-02-14 DIAGNOSIS — I10 Essential (primary) hypertension: Secondary | ICD-10-CM | POA: Diagnosis not present

## 2017-02-14 DIAGNOSIS — D701 Agranulocytosis secondary to cancer chemotherapy: Secondary | ICD-10-CM | POA: Diagnosis not present

## 2017-02-14 DIAGNOSIS — Z452 Encounter for adjustment and management of vascular access device: Secondary | ICD-10-CM | POA: Diagnosis not present

## 2017-02-15 DIAGNOSIS — D46Z Other myelodysplastic syndromes: Secondary | ICD-10-CM | POA: Diagnosis not present

## 2017-02-15 DIAGNOSIS — C9201 Acute myeloblastic leukemia, in remission: Secondary | ICD-10-CM | POA: Diagnosis not present

## 2017-02-15 DIAGNOSIS — C9200 Acute myeloblastic leukemia, not having achieved remission: Secondary | ICD-10-CM | POA: Diagnosis not present

## 2017-02-17 DIAGNOSIS — C9200 Acute myeloblastic leukemia, not having achieved remission: Secondary | ICD-10-CM | POA: Diagnosis not present

## 2017-02-20 DIAGNOSIS — C9200 Acute myeloblastic leukemia, not having achieved remission: Secondary | ICD-10-CM | POA: Diagnosis not present

## 2017-02-21 DIAGNOSIS — D46Z Other myelodysplastic syndromes: Secondary | ICD-10-CM | POA: Diagnosis not present

## 2017-02-21 DIAGNOSIS — Z452 Encounter for adjustment and management of vascular access device: Secondary | ICD-10-CM | POA: Diagnosis not present

## 2017-02-21 DIAGNOSIS — I10 Essential (primary) hypertension: Secondary | ICD-10-CM | POA: Diagnosis not present

## 2017-02-21 DIAGNOSIS — T451X5D Adverse effect of antineoplastic and immunosuppressive drugs, subsequent encounter: Secondary | ICD-10-CM | POA: Diagnosis not present

## 2017-02-21 DIAGNOSIS — D701 Agranulocytosis secondary to cancer chemotherapy: Secondary | ICD-10-CM | POA: Diagnosis not present

## 2017-02-21 DIAGNOSIS — C9200 Acute myeloblastic leukemia, not having achieved remission: Secondary | ICD-10-CM | POA: Diagnosis not present

## 2017-02-22 DIAGNOSIS — D469 Myelodysplastic syndrome, unspecified: Secondary | ICD-10-CM | POA: Diagnosis not present

## 2017-02-22 DIAGNOSIS — C9200 Acute myeloblastic leukemia, not having achieved remission: Secondary | ICD-10-CM | POA: Diagnosis not present

## 2017-02-24 DIAGNOSIS — C9200 Acute myeloblastic leukemia, not having achieved remission: Secondary | ICD-10-CM | POA: Diagnosis not present

## 2017-02-27 DIAGNOSIS — I10 Essential (primary) hypertension: Secondary | ICD-10-CM | POA: Diagnosis not present

## 2017-02-27 DIAGNOSIS — Z452 Encounter for adjustment and management of vascular access device: Secondary | ICD-10-CM | POA: Diagnosis not present

## 2017-02-27 DIAGNOSIS — D701 Agranulocytosis secondary to cancer chemotherapy: Secondary | ICD-10-CM | POA: Diagnosis not present

## 2017-02-27 DIAGNOSIS — D46Z Other myelodysplastic syndromes: Secondary | ICD-10-CM | POA: Diagnosis not present

## 2017-02-27 DIAGNOSIS — T451X5D Adverse effect of antineoplastic and immunosuppressive drugs, subsequent encounter: Secondary | ICD-10-CM | POA: Diagnosis not present

## 2017-02-27 DIAGNOSIS — C9200 Acute myeloblastic leukemia, not having achieved remission: Secondary | ICD-10-CM | POA: Diagnosis not present

## 2017-02-28 DIAGNOSIS — C9200 Acute myeloblastic leukemia, not having achieved remission: Secondary | ICD-10-CM | POA: Diagnosis not present

## 2017-02-28 DIAGNOSIS — I361 Nonrheumatic tricuspid (valve) insufficiency: Secondary | ICD-10-CM | POA: Diagnosis not present

## 2017-02-28 DIAGNOSIS — Z7682 Awaiting organ transplant status: Secondary | ICD-10-CM | POA: Diagnosis not present

## 2017-02-28 DIAGNOSIS — I083 Combined rheumatic disorders of mitral, aortic and tricuspid valves: Secondary | ICD-10-CM | POA: Diagnosis not present

## 2017-02-28 DIAGNOSIS — I348 Other nonrheumatic mitral valve disorders: Secondary | ICD-10-CM | POA: Diagnosis not present

## 2017-02-28 DIAGNOSIS — I517 Cardiomegaly: Secondary | ICD-10-CM | POA: Diagnosis not present

## 2017-02-28 DIAGNOSIS — Z005 Encounter for examination of potential donor of organ and tissue: Secondary | ICD-10-CM | POA: Diagnosis not present

## 2017-03-02 DIAGNOSIS — C9200 Acute myeloblastic leukemia, not having achieved remission: Secondary | ICD-10-CM | POA: Diagnosis not present

## 2017-03-02 DIAGNOSIS — D469 Myelodysplastic syndrome, unspecified: Secondary | ICD-10-CM | POA: Diagnosis not present

## 2017-03-06 DIAGNOSIS — C9200 Acute myeloblastic leukemia, not having achieved remission: Secondary | ICD-10-CM | POA: Diagnosis not present

## 2017-03-06 DIAGNOSIS — I10 Essential (primary) hypertension: Secondary | ICD-10-CM | POA: Diagnosis not present

## 2017-03-06 DIAGNOSIS — D701 Agranulocytosis secondary to cancer chemotherapy: Secondary | ICD-10-CM | POA: Diagnosis not present

## 2017-03-06 DIAGNOSIS — D46Z Other myelodysplastic syndromes: Secondary | ICD-10-CM | POA: Diagnosis not present

## 2017-03-06 DIAGNOSIS — Z452 Encounter for adjustment and management of vascular access device: Secondary | ICD-10-CM | POA: Diagnosis not present

## 2017-03-06 DIAGNOSIS — T451X5D Adverse effect of antineoplastic and immunosuppressive drugs, subsequent encounter: Secondary | ICD-10-CM | POA: Diagnosis not present

## 2017-03-07 DIAGNOSIS — D469 Myelodysplastic syndrome, unspecified: Secondary | ICD-10-CM | POA: Diagnosis not present

## 2017-03-07 DIAGNOSIS — I10 Essential (primary) hypertension: Secondary | ICD-10-CM | POA: Diagnosis not present

## 2017-03-07 DIAGNOSIS — Z7682 Awaiting organ transplant status: Secondary | ICD-10-CM | POA: Diagnosis not present

## 2017-03-07 DIAGNOSIS — C9200 Acute myeloblastic leukemia, not having achieved remission: Secondary | ICD-10-CM | POA: Diagnosis not present

## 2017-03-07 DIAGNOSIS — Z005 Encounter for examination of potential donor of organ and tissue: Secondary | ICD-10-CM | POA: Diagnosis not present

## 2017-03-07 DIAGNOSIS — D46Z Other myelodysplastic syndromes: Secondary | ICD-10-CM | POA: Diagnosis not present

## 2017-03-10 DIAGNOSIS — Z9481 Bone marrow transplant status: Secondary | ICD-10-CM | POA: Diagnosis not present

## 2017-03-10 DIAGNOSIS — D696 Thrombocytopenia, unspecified: Secondary | ICD-10-CM | POA: Diagnosis not present

## 2017-03-10 DIAGNOSIS — Z719 Counseling, unspecified: Secondary | ICD-10-CM | POA: Diagnosis not present

## 2017-03-10 DIAGNOSIS — C9200 Acute myeloblastic leukemia, not having achieved remission: Secondary | ICD-10-CM | POA: Diagnosis not present

## 2017-03-10 DIAGNOSIS — I10 Essential (primary) hypertension: Secondary | ICD-10-CM | POA: Diagnosis not present

## 2017-03-10 DIAGNOSIS — Z452 Encounter for adjustment and management of vascular access device: Secondary | ICD-10-CM | POA: Diagnosis not present

## 2017-03-11 DIAGNOSIS — D469 Myelodysplastic syndrome, unspecified: Secondary | ICD-10-CM | POA: Diagnosis present

## 2017-03-11 DIAGNOSIS — R110 Nausea: Secondary | ICD-10-CM | POA: Diagnosis not present

## 2017-03-11 DIAGNOSIS — T80212A Local infection due to central venous catheter, initial encounter: Secondary | ICD-10-CM | POA: Diagnosis not present

## 2017-03-11 DIAGNOSIS — C9200 Acute myeloblastic leukemia, not having achieved remission: Secondary | ICD-10-CM | POA: Diagnosis not present

## 2017-03-11 DIAGNOSIS — R918 Other nonspecific abnormal finding of lung field: Secondary | ICD-10-CM | POA: Diagnosis not present

## 2017-03-11 DIAGNOSIS — M1990 Unspecified osteoarthritis, unspecified site: Secondary | ICD-10-CM | POA: Diagnosis present

## 2017-03-11 DIAGNOSIS — E8770 Fluid overload, unspecified: Secondary | ICD-10-CM | POA: Diagnosis not present

## 2017-03-11 DIAGNOSIS — Z8042 Family history of malignant neoplasm of prostate: Secondary | ICD-10-CM | POA: Diagnosis not present

## 2017-03-11 DIAGNOSIS — K1230 Oral mucositis (ulcerative), unspecified: Secondary | ICD-10-CM | POA: Diagnosis not present

## 2017-03-11 DIAGNOSIS — Z452 Encounter for adjustment and management of vascular access device: Secondary | ICD-10-CM | POA: Diagnosis not present

## 2017-03-11 DIAGNOSIS — Z9989 Dependence on other enabling machines and devices: Secondary | ICD-10-CM | POA: Diagnosis not present

## 2017-03-11 DIAGNOSIS — K5900 Constipation, unspecified: Secondary | ICD-10-CM | POA: Diagnosis not present

## 2017-03-11 DIAGNOSIS — R5081 Fever presenting with conditions classified elsewhere: Secondary | ICD-10-CM | POA: Diagnosis not present

## 2017-03-11 DIAGNOSIS — R0602 Shortness of breath: Secondary | ICD-10-CM | POA: Diagnosis not present

## 2017-03-11 DIAGNOSIS — Z5111 Encounter for antineoplastic chemotherapy: Secondary | ICD-10-CM | POA: Diagnosis not present

## 2017-03-11 DIAGNOSIS — C92A1 Acute myeloid leukemia with multilineage dysplasia, in remission: Secondary | ICD-10-CM | POA: Diagnosis present

## 2017-03-11 DIAGNOSIS — R509 Fever, unspecified: Secondary | ICD-10-CM | POA: Diagnosis not present

## 2017-03-11 DIAGNOSIS — G4733 Obstructive sleep apnea (adult) (pediatric): Secondary | ICD-10-CM | POA: Diagnosis present

## 2017-03-11 DIAGNOSIS — R7881 Bacteremia: Secondary | ICD-10-CM | POA: Diagnosis not present

## 2017-03-11 DIAGNOSIS — D61818 Other pancytopenia: Secondary | ICD-10-CM | POA: Diagnosis not present

## 2017-03-11 DIAGNOSIS — A0472 Enterocolitis due to Clostridium difficile, not specified as recurrent: Secondary | ICD-10-CM | POA: Diagnosis not present

## 2017-03-11 DIAGNOSIS — D46Z Other myelodysplastic syndromes: Secondary | ICD-10-CM | POA: Diagnosis not present

## 2017-03-11 DIAGNOSIS — I771 Stricture of artery: Secondary | ICD-10-CM | POA: Diagnosis not present

## 2017-03-11 DIAGNOSIS — J9811 Atelectasis: Secondary | ICD-10-CM | POA: Diagnosis not present

## 2017-03-11 DIAGNOSIS — Z808 Family history of malignant neoplasm of other organs or systems: Secondary | ICD-10-CM | POA: Diagnosis not present

## 2017-03-11 DIAGNOSIS — I10 Essential (primary) hypertension: Secondary | ICD-10-CM | POA: Diagnosis present

## 2017-03-11 DIAGNOSIS — D709 Neutropenia, unspecified: Secondary | ICD-10-CM | POA: Diagnosis not present

## 2017-03-11 DIAGNOSIS — Z9221 Personal history of antineoplastic chemotherapy: Secondary | ICD-10-CM | POA: Diagnosis not present

## 2017-03-11 DIAGNOSIS — Z801 Family history of malignant neoplasm of trachea, bronchus and lung: Secondary | ICD-10-CM | POA: Diagnosis not present

## 2017-03-11 DIAGNOSIS — Z9484 Stem cells transplant status: Secondary | ICD-10-CM | POA: Diagnosis not present

## 2017-03-11 DIAGNOSIS — J189 Pneumonia, unspecified organism: Secondary | ICD-10-CM | POA: Diagnosis not present

## 2017-03-11 DIAGNOSIS — Z8546 Personal history of malignant neoplasm of prostate: Secondary | ICD-10-CM | POA: Diagnosis not present

## 2017-03-11 DIAGNOSIS — Z803 Family history of malignant neoplasm of breast: Secondary | ICD-10-CM | POA: Diagnosis not present

## 2017-03-11 DIAGNOSIS — R5383 Other fatigue: Secondary | ICD-10-CM | POA: Diagnosis not present

## 2017-03-11 DIAGNOSIS — D61810 Antineoplastic chemotherapy induced pancytopenia: Secondary | ICD-10-CM | POA: Diagnosis not present

## 2017-03-11 DIAGNOSIS — K219 Gastro-esophageal reflux disease without esophagitis: Secondary | ICD-10-CM | POA: Diagnosis present

## 2017-03-11 DIAGNOSIS — R0989 Other specified symptoms and signs involving the circulatory and respiratory systems: Secondary | ICD-10-CM | POA: Diagnosis not present

## 2017-03-11 DIAGNOSIS — R040 Epistaxis: Secondary | ICD-10-CM | POA: Diagnosis not present

## 2017-03-11 DIAGNOSIS — R42 Dizziness and giddiness: Secondary | ICD-10-CM | POA: Diagnosis not present

## 2017-03-17 HISTORY — PX: LIMBAL STEM CELL TRANSPLANT: SHX1969

## 2017-04-05 DIAGNOSIS — D89813 Graft-versus-host disease, unspecified: Secondary | ICD-10-CM | POA: Diagnosis not present

## 2017-04-05 DIAGNOSIS — Z87891 Personal history of nicotine dependence: Secondary | ICD-10-CM | POA: Diagnosis not present

## 2017-04-05 DIAGNOSIS — Z888 Allergy status to other drugs, medicaments and biological substances status: Secondary | ICD-10-CM | POA: Diagnosis not present

## 2017-04-05 DIAGNOSIS — Z5181 Encounter for therapeutic drug level monitoring: Secondary | ICD-10-CM | POA: Diagnosis not present

## 2017-04-05 DIAGNOSIS — C92A1 Acute myeloid leukemia with multilineage dysplasia, in remission: Secondary | ICD-10-CM | POA: Diagnosis not present

## 2017-04-05 DIAGNOSIS — Z48290 Encounter for aftercare following bone marrow transplant: Secondary | ICD-10-CM | POA: Diagnosis not present

## 2017-04-05 DIAGNOSIS — C9200 Acute myeloblastic leukemia, not having achieved remission: Secondary | ICD-10-CM | POA: Diagnosis not present

## 2017-04-05 DIAGNOSIS — D709 Neutropenia, unspecified: Secondary | ICD-10-CM | POA: Diagnosis not present

## 2017-04-05 DIAGNOSIS — L539 Erythematous condition, unspecified: Secondary | ICD-10-CM | POA: Diagnosis not present

## 2017-04-05 DIAGNOSIS — Z8546 Personal history of malignant neoplasm of prostate: Secondary | ICD-10-CM | POA: Diagnosis not present

## 2017-04-05 DIAGNOSIS — G4733 Obstructive sleep apnea (adult) (pediatric): Secondary | ICD-10-CM | POA: Diagnosis not present

## 2017-04-05 DIAGNOSIS — C9201 Acute myeloblastic leukemia, in remission: Secondary | ICD-10-CM | POA: Diagnosis not present

## 2017-04-05 DIAGNOSIS — Z79899 Other long term (current) drug therapy: Secondary | ICD-10-CM | POA: Diagnosis not present

## 2017-04-05 DIAGNOSIS — R110 Nausea: Secondary | ICD-10-CM | POA: Diagnosis not present

## 2017-04-05 DIAGNOSIS — D61818 Other pancytopenia: Secondary | ICD-10-CM | POA: Diagnosis not present

## 2017-04-05 DIAGNOSIS — Z9079 Acquired absence of other genital organ(s): Secondary | ICD-10-CM | POA: Diagnosis not present

## 2017-04-05 DIAGNOSIS — J3489 Other specified disorders of nose and nasal sinuses: Secondary | ICD-10-CM | POA: Diagnosis not present

## 2017-04-05 DIAGNOSIS — J449 Chronic obstructive pulmonary disease, unspecified: Secondary | ICD-10-CM | POA: Diagnosis not present

## 2017-04-05 DIAGNOSIS — D703 Neutropenia due to infection: Secondary | ICD-10-CM | POA: Diagnosis not present

## 2017-04-05 DIAGNOSIS — Z9484 Stem cells transplant status: Secondary | ICD-10-CM | POA: Diagnosis not present

## 2017-04-05 DIAGNOSIS — I10 Essential (primary) hypertension: Secondary | ICD-10-CM | POA: Diagnosis not present

## 2017-04-05 DIAGNOSIS — K219 Gastro-esophageal reflux disease without esophagitis: Secondary | ICD-10-CM | POA: Diagnosis not present

## 2017-04-05 DIAGNOSIS — Z85820 Personal history of malignant melanoma of skin: Secondary | ICD-10-CM | POA: Diagnosis not present

## 2017-04-05 DIAGNOSIS — K5900 Constipation, unspecified: Secondary | ICD-10-CM | POA: Diagnosis not present

## 2017-04-05 DIAGNOSIS — Z9221 Personal history of antineoplastic chemotherapy: Secondary | ICD-10-CM | POA: Diagnosis not present

## 2017-04-05 DIAGNOSIS — A0472 Enterocolitis due to Clostridium difficile, not specified as recurrent: Secondary | ICD-10-CM | POA: Diagnosis not present

## 2017-04-05 DIAGNOSIS — Z79891 Long term (current) use of opiate analgesic: Secondary | ICD-10-CM | POA: Diagnosis not present

## 2017-04-07 DIAGNOSIS — J988 Other specified respiratory disorders: Secondary | ICD-10-CM | POA: Diagnosis not present

## 2017-04-07 DIAGNOSIS — Z87891 Personal history of nicotine dependence: Secondary | ICD-10-CM | POA: Diagnosis not present

## 2017-04-07 DIAGNOSIS — R7989 Other specified abnormal findings of blood chemistry: Secondary | ICD-10-CM | POA: Diagnosis not present

## 2017-04-07 DIAGNOSIS — Z9079 Acquired absence of other genital organ(s): Secondary | ICD-10-CM | POA: Diagnosis not present

## 2017-04-07 DIAGNOSIS — Z6824 Body mass index (BMI) 24.0-24.9, adult: Secondary | ICD-10-CM | POA: Diagnosis not present

## 2017-04-07 DIAGNOSIS — R5383 Other fatigue: Secondary | ICD-10-CM | POA: Diagnosis not present

## 2017-04-07 DIAGNOSIS — A0472 Enterocolitis due to Clostridium difficile, not specified as recurrent: Secondary | ICD-10-CM | POA: Diagnosis not present

## 2017-04-07 DIAGNOSIS — Z86008 Personal history of in-situ neoplasm of other site: Secondary | ICD-10-CM | POA: Diagnosis not present

## 2017-04-07 DIAGNOSIS — K219 Gastro-esophageal reflux disease without esophagitis: Secondary | ICD-10-CM | POA: Diagnosis not present

## 2017-04-07 DIAGNOSIS — R110 Nausea: Secondary | ICD-10-CM | POA: Diagnosis not present

## 2017-04-07 DIAGNOSIS — R7881 Bacteremia: Secondary | ICD-10-CM | POA: Diagnosis not present

## 2017-04-07 DIAGNOSIS — R638 Other symptoms and signs concerning food and fluid intake: Secondary | ICD-10-CM | POA: Diagnosis not present

## 2017-04-07 DIAGNOSIS — Z9484 Stem cells transplant status: Secondary | ICD-10-CM | POA: Diagnosis not present

## 2017-04-07 DIAGNOSIS — Z5181 Encounter for therapeutic drug level monitoring: Secondary | ICD-10-CM | POA: Diagnosis not present

## 2017-04-07 DIAGNOSIS — L538 Other specified erythematous conditions: Secondary | ICD-10-CM | POA: Diagnosis not present

## 2017-04-07 DIAGNOSIS — R197 Diarrhea, unspecified: Secondary | ICD-10-CM | POA: Diagnosis not present

## 2017-04-07 DIAGNOSIS — B958 Unspecified staphylococcus as the cause of diseases classified elsewhere: Secondary | ICD-10-CM | POA: Diagnosis not present

## 2017-04-07 DIAGNOSIS — Z8546 Personal history of malignant neoplasm of prostate: Secondary | ICD-10-CM | POA: Diagnosis not present

## 2017-04-07 DIAGNOSIS — C9200 Acute myeloblastic leukemia, not having achieved remission: Secondary | ICD-10-CM | POA: Diagnosis not present

## 2017-04-07 DIAGNOSIS — K5900 Constipation, unspecified: Secondary | ICD-10-CM | POA: Diagnosis not present

## 2017-04-07 DIAGNOSIS — C9201 Acute myeloblastic leukemia, in remission: Secondary | ICD-10-CM | POA: Diagnosis not present

## 2017-04-07 DIAGNOSIS — G4733 Obstructive sleep apnea (adult) (pediatric): Secondary | ICD-10-CM | POA: Diagnosis not present

## 2017-04-07 DIAGNOSIS — Z86018 Personal history of other benign neoplasm: Secondary | ICD-10-CM | POA: Diagnosis not present

## 2017-04-07 DIAGNOSIS — D469 Myelodysplastic syndrome, unspecified: Secondary | ICD-10-CM | POA: Diagnosis not present

## 2017-04-07 DIAGNOSIS — R439 Unspecified disturbances of smell and taste: Secondary | ICD-10-CM | POA: Diagnosis not present

## 2017-04-07 DIAGNOSIS — I10 Essential (primary) hypertension: Secondary | ICD-10-CM | POA: Diagnosis not present

## 2017-04-10 DIAGNOSIS — Z79899 Other long term (current) drug therapy: Secondary | ICD-10-CM | POA: Diagnosis not present

## 2017-04-10 DIAGNOSIS — Z5181 Encounter for therapeutic drug level monitoring: Secondary | ICD-10-CM | POA: Diagnosis not present

## 2017-04-10 DIAGNOSIS — C9200 Acute myeloblastic leukemia, not having achieved remission: Secondary | ICD-10-CM | POA: Diagnosis not present

## 2017-04-10 DIAGNOSIS — Z6824 Body mass index (BMI) 24.0-24.9, adult: Secondary | ICD-10-CM | POA: Diagnosis not present

## 2017-04-10 DIAGNOSIS — I10 Essential (primary) hypertension: Secondary | ICD-10-CM | POA: Diagnosis not present

## 2017-04-10 DIAGNOSIS — Z48290 Encounter for aftercare following bone marrow transplant: Secondary | ICD-10-CM | POA: Diagnosis not present

## 2017-04-10 DIAGNOSIS — N179 Acute kidney failure, unspecified: Secondary | ICD-10-CM | POA: Diagnosis not present

## 2017-04-10 DIAGNOSIS — R638 Other symptoms and signs concerning food and fluid intake: Secondary | ICD-10-CM | POA: Diagnosis not present

## 2017-04-10 DIAGNOSIS — M899 Disorder of bone, unspecified: Secondary | ICD-10-CM | POA: Diagnosis not present

## 2017-04-10 DIAGNOSIS — Z9484 Stem cells transplant status: Secondary | ICD-10-CM | POA: Diagnosis not present

## 2017-04-10 DIAGNOSIS — Z87891 Personal history of nicotine dependence: Secondary | ICD-10-CM | POA: Diagnosis not present

## 2017-04-11 DIAGNOSIS — D61818 Other pancytopenia: Secondary | ICD-10-CM | POA: Diagnosis not present

## 2017-04-11 DIAGNOSIS — C92A1 Acute myeloid leukemia with multilineage dysplasia, in remission: Secondary | ICD-10-CM | POA: Diagnosis not present

## 2017-04-11 DIAGNOSIS — Z48290 Encounter for aftercare following bone marrow transplant: Secondary | ICD-10-CM | POA: Diagnosis not present

## 2017-04-11 DIAGNOSIS — I10 Essential (primary) hypertension: Secondary | ICD-10-CM | POA: Diagnosis not present

## 2017-04-11 DIAGNOSIS — D709 Neutropenia, unspecified: Secondary | ICD-10-CM | POA: Diagnosis not present

## 2017-04-11 DIAGNOSIS — J449 Chronic obstructive pulmonary disease, unspecified: Secondary | ICD-10-CM | POA: Diagnosis not present

## 2017-04-12 DIAGNOSIS — R530 Neoplastic (malignant) related fatigue: Secondary | ICD-10-CM | POA: Diagnosis not present

## 2017-04-12 DIAGNOSIS — J988 Other specified respiratory disorders: Secondary | ICD-10-CM | POA: Diagnosis not present

## 2017-04-12 DIAGNOSIS — Z8546 Personal history of malignant neoplasm of prostate: Secondary | ICD-10-CM | POA: Diagnosis not present

## 2017-04-12 DIAGNOSIS — Z9079 Acquired absence of other genital organ(s): Secondary | ICD-10-CM | POA: Diagnosis not present

## 2017-04-12 DIAGNOSIS — Z9484 Stem cells transplant status: Secondary | ICD-10-CM | POA: Diagnosis not present

## 2017-04-12 DIAGNOSIS — K5900 Constipation, unspecified: Secondary | ICD-10-CM | POA: Diagnosis not present

## 2017-04-12 DIAGNOSIS — C9200 Acute myeloblastic leukemia, not having achieved remission: Secondary | ICD-10-CM | POA: Diagnosis not present

## 2017-04-12 DIAGNOSIS — D469 Myelodysplastic syndrome, unspecified: Secondary | ICD-10-CM | POA: Diagnosis not present

## 2017-04-12 DIAGNOSIS — Z85820 Personal history of malignant melanoma of skin: Secondary | ICD-10-CM | POA: Diagnosis not present

## 2017-04-12 DIAGNOSIS — N179 Acute kidney failure, unspecified: Secondary | ICD-10-CM | POA: Diagnosis not present

## 2017-04-12 DIAGNOSIS — Z79899 Other long term (current) drug therapy: Secondary | ICD-10-CM | POA: Diagnosis not present

## 2017-04-12 DIAGNOSIS — G4733 Obstructive sleep apnea (adult) (pediatric): Secondary | ICD-10-CM | POA: Diagnosis not present

## 2017-04-12 DIAGNOSIS — J3489 Other specified disorders of nose and nasal sinuses: Secondary | ICD-10-CM | POA: Diagnosis not present

## 2017-04-12 DIAGNOSIS — Z5181 Encounter for therapeutic drug level monitoring: Secondary | ICD-10-CM | POA: Diagnosis not present

## 2017-04-12 DIAGNOSIS — R21 Rash and other nonspecific skin eruption: Secondary | ICD-10-CM | POA: Diagnosis not present

## 2017-04-12 DIAGNOSIS — R110 Nausea: Secondary | ICD-10-CM | POA: Diagnosis not present

## 2017-04-12 DIAGNOSIS — K219 Gastro-esophageal reflux disease without esophagitis: Secondary | ICD-10-CM | POA: Diagnosis not present

## 2017-04-12 DIAGNOSIS — Z888 Allergy status to other drugs, medicaments and biological substances status: Secondary | ICD-10-CM | POA: Diagnosis not present

## 2017-04-12 DIAGNOSIS — R7989 Other specified abnormal findings of blood chemistry: Secondary | ICD-10-CM | POA: Diagnosis not present

## 2017-04-12 DIAGNOSIS — Z87891 Personal history of nicotine dependence: Secondary | ICD-10-CM | POA: Diagnosis not present

## 2017-04-12 DIAGNOSIS — A0472 Enterocolitis due to Clostridium difficile, not specified as recurrent: Secondary | ICD-10-CM | POA: Diagnosis not present

## 2017-04-12 DIAGNOSIS — I10 Essential (primary) hypertension: Secondary | ICD-10-CM | POA: Diagnosis not present

## 2017-04-12 DIAGNOSIS — Z6824 Body mass index (BMI) 24.0-24.9, adult: Secondary | ICD-10-CM | POA: Diagnosis not present

## 2017-04-14 DIAGNOSIS — Z9484 Stem cells transplant status: Secondary | ICD-10-CM | POA: Diagnosis not present

## 2017-04-14 DIAGNOSIS — Z6824 Body mass index (BMI) 24.0-24.9, adult: Secondary | ICD-10-CM | POA: Diagnosis not present

## 2017-04-14 DIAGNOSIS — M899 Disorder of bone, unspecified: Secondary | ICD-10-CM | POA: Diagnosis not present

## 2017-04-14 DIAGNOSIS — D469 Myelodysplastic syndrome, unspecified: Secondary | ICD-10-CM | POA: Diagnosis not present

## 2017-04-15 DIAGNOSIS — D469 Myelodysplastic syndrome, unspecified: Secondary | ICD-10-CM | POA: Diagnosis not present

## 2017-04-15 DIAGNOSIS — D46Z Other myelodysplastic syndromes: Secondary | ICD-10-CM | POA: Diagnosis not present

## 2017-04-15 DIAGNOSIS — Z6823 Body mass index (BMI) 23.0-23.9, adult: Secondary | ICD-10-CM | POA: Diagnosis not present

## 2017-04-17 DIAGNOSIS — R21 Rash and other nonspecific skin eruption: Secondary | ICD-10-CM | POA: Diagnosis not present

## 2017-04-17 DIAGNOSIS — C9200 Acute myeloblastic leukemia, not having achieved remission: Secondary | ICD-10-CM | POA: Diagnosis not present

## 2017-04-17 DIAGNOSIS — D46Z Other myelodysplastic syndromes: Secondary | ICD-10-CM | POA: Diagnosis not present

## 2017-04-17 DIAGNOSIS — D89813 Graft-versus-host disease, unspecified: Secondary | ICD-10-CM | POA: Diagnosis not present

## 2017-04-17 DIAGNOSIS — R52 Pain, unspecified: Secondary | ICD-10-CM | POA: Diagnosis not present

## 2017-04-17 DIAGNOSIS — R638 Other symptoms and signs concerning food and fluid intake: Secondary | ICD-10-CM | POA: Diagnosis not present

## 2017-04-17 DIAGNOSIS — A0472 Enterocolitis due to Clostridium difficile, not specified as recurrent: Secondary | ICD-10-CM | POA: Diagnosis not present

## 2017-04-17 DIAGNOSIS — R195 Other fecal abnormalities: Secondary | ICD-10-CM | POA: Diagnosis not present

## 2017-04-17 DIAGNOSIS — Z6823 Body mass index (BMI) 23.0-23.9, adult: Secondary | ICD-10-CM | POA: Diagnosis not present

## 2017-04-17 DIAGNOSIS — D7289 Other specified disorders of white blood cells: Secondary | ICD-10-CM | POA: Diagnosis not present

## 2017-04-17 DIAGNOSIS — R7881 Bacteremia: Secondary | ICD-10-CM | POA: Diagnosis not present

## 2017-04-17 DIAGNOSIS — D72829 Elevated white blood cell count, unspecified: Secondary | ICD-10-CM | POA: Diagnosis not present

## 2017-04-17 DIAGNOSIS — R112 Nausea with vomiting, unspecified: Secondary | ICD-10-CM | POA: Diagnosis not present

## 2017-04-18 DIAGNOSIS — D696 Thrombocytopenia, unspecified: Secondary | ICD-10-CM | POA: Diagnosis not present

## 2017-04-18 DIAGNOSIS — R110 Nausea: Secondary | ICD-10-CM | POA: Diagnosis not present

## 2017-04-18 DIAGNOSIS — D649 Anemia, unspecified: Secondary | ICD-10-CM | POA: Diagnosis not present

## 2017-04-18 DIAGNOSIS — R112 Nausea with vomiting, unspecified: Secondary | ICD-10-CM | POA: Diagnosis not present

## 2017-04-18 DIAGNOSIS — C9200 Acute myeloblastic leukemia, not having achieved remission: Secondary | ICD-10-CM | POA: Diagnosis not present

## 2017-04-18 DIAGNOSIS — R21 Rash and other nonspecific skin eruption: Secondary | ICD-10-CM | POA: Diagnosis not present

## 2017-04-18 DIAGNOSIS — K3189 Other diseases of stomach and duodenum: Secondary | ICD-10-CM | POA: Diagnosis not present

## 2017-04-18 DIAGNOSIS — D89813 Graft-versus-host disease, unspecified: Secondary | ICD-10-CM | POA: Diagnosis not present

## 2017-04-18 DIAGNOSIS — D469 Myelodysplastic syndrome, unspecified: Secondary | ICD-10-CM | POA: Diagnosis not present

## 2017-04-18 DIAGNOSIS — D721 Eosinophilia: Secondary | ICD-10-CM | POA: Diagnosis not present

## 2017-04-19 DIAGNOSIS — D89813 Graft-versus-host disease, unspecified: Secondary | ICD-10-CM | POA: Diagnosis not present

## 2017-04-19 DIAGNOSIS — R21 Rash and other nonspecific skin eruption: Secondary | ICD-10-CM | POA: Diagnosis not present

## 2017-04-19 DIAGNOSIS — D469 Myelodysplastic syndrome, unspecified: Secondary | ICD-10-CM | POA: Diagnosis not present

## 2017-04-19 DIAGNOSIS — K2970 Gastritis, unspecified, without bleeding: Secondary | ICD-10-CM | POA: Diagnosis not present

## 2017-04-19 DIAGNOSIS — D721 Eosinophilia: Secondary | ICD-10-CM | POA: Diagnosis not present

## 2017-04-19 DIAGNOSIS — C9200 Acute myeloblastic leukemia, not having achieved remission: Secondary | ICD-10-CM | POA: Diagnosis not present

## 2017-04-19 DIAGNOSIS — Z9484 Stem cells transplant status: Secondary | ICD-10-CM | POA: Diagnosis not present

## 2017-04-20 DIAGNOSIS — D89813 Graft-versus-host disease, unspecified: Secondary | ICD-10-CM | POA: Diagnosis not present

## 2017-04-20 DIAGNOSIS — C9200 Acute myeloblastic leukemia, not having achieved remission: Secondary | ICD-10-CM | POA: Diagnosis not present

## 2017-04-20 DIAGNOSIS — D721 Eosinophilia: Secondary | ICD-10-CM | POA: Diagnosis not present

## 2017-04-20 DIAGNOSIS — R21 Rash and other nonspecific skin eruption: Secondary | ICD-10-CM | POA: Diagnosis not present

## 2017-04-21 DIAGNOSIS — C9200 Acute myeloblastic leukemia, not having achieved remission: Secondary | ICD-10-CM | POA: Diagnosis not present

## 2017-04-21 DIAGNOSIS — K2970 Gastritis, unspecified, without bleeding: Secondary | ICD-10-CM | POA: Diagnosis not present

## 2017-04-21 DIAGNOSIS — R21 Rash and other nonspecific skin eruption: Secondary | ICD-10-CM | POA: Diagnosis not present

## 2017-04-21 DIAGNOSIS — D89813 Graft-versus-host disease, unspecified: Secondary | ICD-10-CM | POA: Diagnosis not present

## 2017-04-23 DIAGNOSIS — C9200 Acute myeloblastic leukemia, not having achieved remission: Secondary | ICD-10-CM | POA: Diagnosis not present

## 2017-04-23 DIAGNOSIS — D469 Myelodysplastic syndrome, unspecified: Secondary | ICD-10-CM | POA: Diagnosis not present

## 2017-04-26 DIAGNOSIS — R739 Hyperglycemia, unspecified: Secondary | ICD-10-CM | POA: Diagnosis not present

## 2017-04-26 DIAGNOSIS — K5900 Constipation, unspecified: Secondary | ICD-10-CM | POA: Diagnosis not present

## 2017-04-26 DIAGNOSIS — H538 Other visual disturbances: Secondary | ICD-10-CM | POA: Diagnosis not present

## 2017-04-26 DIAGNOSIS — Z48290 Encounter for aftercare following bone marrow transplant: Secondary | ICD-10-CM | POA: Diagnosis not present

## 2017-04-26 DIAGNOSIS — H04123 Dry eye syndrome of bilateral lacrimal glands: Secondary | ICD-10-CM | POA: Diagnosis not present

## 2017-04-26 DIAGNOSIS — R110 Nausea: Secondary | ICD-10-CM | POA: Diagnosis not present

## 2017-04-26 DIAGNOSIS — Z6823 Body mass index (BMI) 23.0-23.9, adult: Secondary | ICD-10-CM | POA: Diagnosis not present

## 2017-04-26 DIAGNOSIS — Z8546 Personal history of malignant neoplasm of prostate: Secondary | ICD-10-CM | POA: Diagnosis not present

## 2017-04-26 DIAGNOSIS — Z86008 Personal history of in-situ neoplasm of other site: Secondary | ICD-10-CM | POA: Diagnosis not present

## 2017-04-26 DIAGNOSIS — D709 Neutropenia, unspecified: Secondary | ICD-10-CM | POA: Diagnosis not present

## 2017-04-26 DIAGNOSIS — R7989 Other specified abnormal findings of blood chemistry: Secondary | ICD-10-CM | POA: Diagnosis not present

## 2017-04-26 DIAGNOSIS — K219 Gastro-esophageal reflux disease without esophagitis: Secondary | ICD-10-CM | POA: Diagnosis not present

## 2017-04-26 DIAGNOSIS — R5383 Other fatigue: Secondary | ICD-10-CM | POA: Diagnosis not present

## 2017-04-26 DIAGNOSIS — C9201 Acute myeloblastic leukemia, in remission: Secondary | ICD-10-CM | POA: Diagnosis not present

## 2017-04-26 DIAGNOSIS — R0602 Shortness of breath: Secondary | ICD-10-CM | POA: Diagnosis not present

## 2017-04-26 DIAGNOSIS — Z79899 Other long term (current) drug therapy: Secondary | ICD-10-CM | POA: Diagnosis not present

## 2017-04-27 DIAGNOSIS — C9200 Acute myeloblastic leukemia, not having achieved remission: Secondary | ICD-10-CM | POA: Diagnosis not present

## 2017-04-28 DIAGNOSIS — Z8546 Personal history of malignant neoplasm of prostate: Secondary | ICD-10-CM | POA: Diagnosis not present

## 2017-04-28 DIAGNOSIS — Z7952 Long term (current) use of systemic steroids: Secondary | ICD-10-CM | POA: Diagnosis not present

## 2017-04-28 DIAGNOSIS — K5900 Constipation, unspecified: Secondary | ICD-10-CM | POA: Diagnosis not present

## 2017-04-28 DIAGNOSIS — N179 Acute kidney failure, unspecified: Secondary | ICD-10-CM | POA: Diagnosis not present

## 2017-04-28 DIAGNOSIS — Z9079 Acquired absence of other genital organ(s): Secondary | ICD-10-CM | POA: Diagnosis not present

## 2017-04-28 DIAGNOSIS — K219 Gastro-esophageal reflux disease without esophagitis: Secondary | ICD-10-CM | POA: Diagnosis not present

## 2017-04-28 DIAGNOSIS — T865 Complications of stem cell transplant: Secondary | ICD-10-CM | POA: Diagnosis not present

## 2017-04-28 DIAGNOSIS — Z79899 Other long term (current) drug therapy: Secondary | ICD-10-CM | POA: Diagnosis not present

## 2017-04-28 DIAGNOSIS — Z8619 Personal history of other infectious and parasitic diseases: Secondary | ICD-10-CM | POA: Diagnosis not present

## 2017-04-28 DIAGNOSIS — Z888 Allergy status to other drugs, medicaments and biological substances status: Secondary | ICD-10-CM | POA: Diagnosis not present

## 2017-04-28 DIAGNOSIS — Z6824 Body mass index (BMI) 24.0-24.9, adult: Secondary | ICD-10-CM | POA: Diagnosis not present

## 2017-04-28 DIAGNOSIS — D46Z Other myelodysplastic syndromes: Secondary | ICD-10-CM | POA: Diagnosis not present

## 2017-04-28 DIAGNOSIS — D72829 Elevated white blood cell count, unspecified: Secondary | ICD-10-CM | POA: Diagnosis not present

## 2017-04-28 DIAGNOSIS — Z86018 Personal history of other benign neoplasm: Secondary | ICD-10-CM | POA: Diagnosis not present

## 2017-04-28 DIAGNOSIS — D89810 Acute graft-versus-host disease: Secondary | ICD-10-CM | POA: Diagnosis not present

## 2017-04-28 DIAGNOSIS — Z79891 Long term (current) use of opiate analgesic: Secondary | ICD-10-CM | POA: Diagnosis not present

## 2017-04-28 DIAGNOSIS — Z9225 Personal history of immunosupression therapy: Secondary | ICD-10-CM | POA: Diagnosis not present

## 2017-04-28 DIAGNOSIS — R21 Rash and other nonspecific skin eruption: Secondary | ICD-10-CM | POA: Diagnosis not present

## 2017-04-28 DIAGNOSIS — Z85820 Personal history of malignant melanoma of skin: Secondary | ICD-10-CM | POA: Diagnosis not present

## 2017-04-28 DIAGNOSIS — C9201 Acute myeloblastic leukemia, in remission: Secondary | ICD-10-CM | POA: Diagnosis not present

## 2017-04-28 DIAGNOSIS — D89813 Graft-versus-host disease, unspecified: Secondary | ICD-10-CM | POA: Diagnosis not present

## 2017-05-01 DIAGNOSIS — Z6824 Body mass index (BMI) 24.0-24.9, adult: Secondary | ICD-10-CM | POA: Diagnosis not present

## 2017-05-01 DIAGNOSIS — Z7952 Long term (current) use of systemic steroids: Secondary | ICD-10-CM | POA: Diagnosis not present

## 2017-05-01 DIAGNOSIS — Z7682 Awaiting organ transplant status: Secondary | ICD-10-CM | POA: Diagnosis not present

## 2017-05-01 DIAGNOSIS — D89810 Acute graft-versus-host disease: Secondary | ICD-10-CM | POA: Diagnosis not present

## 2017-05-01 DIAGNOSIS — C9200 Acute myeloblastic leukemia, not having achieved remission: Secondary | ICD-10-CM | POA: Diagnosis not present

## 2017-05-01 DIAGNOSIS — M8580 Other specified disorders of bone density and structure, unspecified site: Secondary | ICD-10-CM | POA: Diagnosis not present

## 2017-05-01 DIAGNOSIS — D89813 Graft-versus-host disease, unspecified: Secondary | ICD-10-CM | POA: Diagnosis not present

## 2017-05-01 DIAGNOSIS — T865 Complications of stem cell transplant: Secondary | ICD-10-CM | POA: Diagnosis not present

## 2017-05-01 DIAGNOSIS — D46Z Other myelodysplastic syndromes: Secondary | ICD-10-CM | POA: Diagnosis not present

## 2017-05-01 DIAGNOSIS — D469 Myelodysplastic syndrome, unspecified: Secondary | ICD-10-CM | POA: Diagnosis not present

## 2017-05-01 DIAGNOSIS — R21 Rash and other nonspecific skin eruption: Secondary | ICD-10-CM | POA: Diagnosis not present

## 2017-05-01 DIAGNOSIS — Z888 Allergy status to other drugs, medicaments and biological substances status: Secondary | ICD-10-CM | POA: Diagnosis not present

## 2017-05-02 DIAGNOSIS — Z888 Allergy status to other drugs, medicaments and biological substances status: Secondary | ICD-10-CM | POA: Diagnosis not present

## 2017-05-02 DIAGNOSIS — R7989 Other specified abnormal findings of blood chemistry: Secondary | ICD-10-CM | POA: Diagnosis not present

## 2017-05-02 DIAGNOSIS — R21 Rash and other nonspecific skin eruption: Secondary | ICD-10-CM | POA: Diagnosis not present

## 2017-05-02 DIAGNOSIS — Z6825 Body mass index (BMI) 25.0-25.9, adult: Secondary | ICD-10-CM | POA: Diagnosis not present

## 2017-05-02 DIAGNOSIS — C9200 Acute myeloblastic leukemia, not having achieved remission: Secondary | ICD-10-CM | POA: Diagnosis not present

## 2017-05-02 DIAGNOSIS — D46Z Other myelodysplastic syndromes: Secondary | ICD-10-CM | POA: Diagnosis not present

## 2017-05-02 DIAGNOSIS — D89813 Graft-versus-host disease, unspecified: Secondary | ICD-10-CM | POA: Diagnosis not present

## 2017-05-02 DIAGNOSIS — Z9484 Stem cells transplant status: Secondary | ICD-10-CM | POA: Diagnosis not present

## 2017-05-02 DIAGNOSIS — Z7952 Long term (current) use of systemic steroids: Secondary | ICD-10-CM | POA: Diagnosis not present

## 2017-05-05 DIAGNOSIS — Z95828 Presence of other vascular implants and grafts: Secondary | ICD-10-CM | POA: Diagnosis not present

## 2017-05-05 DIAGNOSIS — D469 Myelodysplastic syndrome, unspecified: Secondary | ICD-10-CM | POA: Diagnosis not present

## 2017-05-05 DIAGNOSIS — Z6825 Body mass index (BMI) 25.0-25.9, adult: Secondary | ICD-10-CM | POA: Diagnosis not present

## 2017-05-05 DIAGNOSIS — Z79899 Other long term (current) drug therapy: Secondary | ICD-10-CM | POA: Diagnosis not present

## 2017-05-05 DIAGNOSIS — Z8719 Personal history of other diseases of the digestive system: Secondary | ICD-10-CM | POA: Diagnosis not present

## 2017-05-05 DIAGNOSIS — C9201 Acute myeloblastic leukemia, in remission: Secondary | ICD-10-CM | POA: Diagnosis not present

## 2017-05-05 DIAGNOSIS — Z792 Long term (current) use of antibiotics: Secondary | ICD-10-CM | POA: Diagnosis not present

## 2017-05-05 DIAGNOSIS — R110 Nausea: Secondary | ICD-10-CM | POA: Diagnosis not present

## 2017-05-05 DIAGNOSIS — Z8669 Personal history of other diseases of the nervous system and sense organs: Secondary | ICD-10-CM | POA: Diagnosis not present

## 2017-05-05 DIAGNOSIS — K645 Perianal venous thrombosis: Secondary | ICD-10-CM | POA: Diagnosis not present

## 2017-05-05 DIAGNOSIS — Z9079 Acquired absence of other genital organ(s): Secondary | ICD-10-CM | POA: Diagnosis not present

## 2017-05-05 DIAGNOSIS — K644 Residual hemorrhoidal skin tags: Secondary | ICD-10-CM | POA: Diagnosis not present

## 2017-05-05 DIAGNOSIS — K219 Gastro-esophageal reflux disease without esophagitis: Secondary | ICD-10-CM | POA: Diagnosis not present

## 2017-05-05 DIAGNOSIS — Z79891 Long term (current) use of opiate analgesic: Secondary | ICD-10-CM | POA: Diagnosis not present

## 2017-05-05 DIAGNOSIS — Z9484 Stem cells transplant status: Secondary | ICD-10-CM | POA: Diagnosis not present

## 2017-05-05 DIAGNOSIS — D89813 Graft-versus-host disease, unspecified: Secondary | ICD-10-CM | POA: Diagnosis not present

## 2017-05-05 DIAGNOSIS — Z85820 Personal history of malignant melanoma of skin: Secondary | ICD-10-CM | POA: Diagnosis not present

## 2017-05-05 DIAGNOSIS — R195 Other fecal abnormalities: Secondary | ICD-10-CM | POA: Diagnosis not present

## 2017-05-05 DIAGNOSIS — Z8546 Personal history of malignant neoplasm of prostate: Secondary | ICD-10-CM | POA: Diagnosis not present

## 2017-05-05 DIAGNOSIS — C9200 Acute myeloblastic leukemia, not having achieved remission: Secondary | ICD-10-CM | POA: Diagnosis not present

## 2017-05-05 DIAGNOSIS — Z8619 Personal history of other infectious and parasitic diseases: Secondary | ICD-10-CM | POA: Diagnosis not present

## 2017-05-05 DIAGNOSIS — R21 Rash and other nonspecific skin eruption: Secondary | ICD-10-CM | POA: Diagnosis not present

## 2017-05-08 DIAGNOSIS — A0471 Enterocolitis due to Clostridium difficile, recurrent: Secondary | ICD-10-CM | POA: Diagnosis not present

## 2017-05-08 DIAGNOSIS — K30 Functional dyspepsia: Secondary | ICD-10-CM | POA: Diagnosis not present

## 2017-05-08 DIAGNOSIS — D46Z Other myelodysplastic syndromes: Secondary | ICD-10-CM | POA: Diagnosis not present

## 2017-05-08 DIAGNOSIS — Z9889 Other specified postprocedural states: Secondary | ICD-10-CM | POA: Diagnosis not present

## 2017-05-08 DIAGNOSIS — Z7952 Long term (current) use of systemic steroids: Secondary | ICD-10-CM | POA: Diagnosis not present

## 2017-05-08 DIAGNOSIS — Z79899 Other long term (current) drug therapy: Secondary | ICD-10-CM | POA: Diagnosis not present

## 2017-05-08 DIAGNOSIS — E860 Dehydration: Secondary | ICD-10-CM | POA: Diagnosis not present

## 2017-05-08 DIAGNOSIS — T865 Complications of stem cell transplant: Secondary | ICD-10-CM | POA: Diagnosis not present

## 2017-05-08 DIAGNOSIS — C9200 Acute myeloblastic leukemia, not having achieved remission: Secondary | ICD-10-CM | POA: Diagnosis not present

## 2017-05-08 DIAGNOSIS — A0472 Enterocolitis due to Clostridium difficile, not specified as recurrent: Secondary | ICD-10-CM | POA: Diagnosis not present

## 2017-05-08 DIAGNOSIS — D89813 Graft-versus-host disease, unspecified: Secondary | ICD-10-CM | POA: Diagnosis not present

## 2017-05-08 DIAGNOSIS — Z85820 Personal history of malignant melanoma of skin: Secondary | ICD-10-CM | POA: Diagnosis not present

## 2017-05-08 DIAGNOSIS — Z8546 Personal history of malignant neoplasm of prostate: Secondary | ICD-10-CM | POA: Diagnosis not present

## 2017-05-08 DIAGNOSIS — M8580 Other specified disorders of bone density and structure, unspecified site: Secondary | ICD-10-CM | POA: Diagnosis not present

## 2017-05-08 DIAGNOSIS — K649 Unspecified hemorrhoids: Secondary | ICD-10-CM | POA: Diagnosis not present

## 2017-05-08 DIAGNOSIS — Z8669 Personal history of other diseases of the nervous system and sense organs: Secondary | ICD-10-CM | POA: Diagnosis not present

## 2017-05-08 DIAGNOSIS — R110 Nausea: Secondary | ICD-10-CM | POA: Diagnosis not present

## 2017-05-08 DIAGNOSIS — R21 Rash and other nonspecific skin eruption: Secondary | ICD-10-CM | POA: Diagnosis not present

## 2017-05-08 DIAGNOSIS — D469 Myelodysplastic syndrome, unspecified: Secondary | ICD-10-CM | POA: Diagnosis not present

## 2017-05-08 DIAGNOSIS — K645 Perianal venous thrombosis: Secondary | ICD-10-CM | POA: Diagnosis not present

## 2017-05-08 DIAGNOSIS — K219 Gastro-esophageal reflux disease without esophagitis: Secondary | ICD-10-CM | POA: Diagnosis not present

## 2017-05-08 DIAGNOSIS — Z6825 Body mass index (BMI) 25.0-25.9, adult: Secondary | ICD-10-CM | POA: Diagnosis not present

## 2017-05-08 DIAGNOSIS — Z9079 Acquired absence of other genital organ(s): Secondary | ICD-10-CM | POA: Diagnosis not present

## 2017-05-10 DIAGNOSIS — C9200 Acute myeloblastic leukemia, not having achieved remission: Secondary | ICD-10-CM | POA: Diagnosis not present

## 2017-05-10 DIAGNOSIS — D46Z Other myelodysplastic syndromes: Secondary | ICD-10-CM | POA: Diagnosis not present

## 2017-05-10 DIAGNOSIS — K645 Perianal venous thrombosis: Secondary | ICD-10-CM | POA: Diagnosis not present

## 2017-05-10 DIAGNOSIS — D89813 Graft-versus-host disease, unspecified: Secondary | ICD-10-CM | POA: Diagnosis not present

## 2017-05-10 DIAGNOSIS — D72829 Elevated white blood cell count, unspecified: Secondary | ICD-10-CM | POA: Diagnosis not present

## 2017-05-10 DIAGNOSIS — A0472 Enterocolitis due to Clostridium difficile, not specified as recurrent: Secondary | ICD-10-CM | POA: Diagnosis not present

## 2017-05-10 DIAGNOSIS — R21 Rash and other nonspecific skin eruption: Secondary | ICD-10-CM | POA: Diagnosis not present

## 2017-05-12 DIAGNOSIS — D701 Agranulocytosis secondary to cancer chemotherapy: Secondary | ICD-10-CM | POA: Diagnosis not present

## 2017-05-12 DIAGNOSIS — T451X5A Adverse effect of antineoplastic and immunosuppressive drugs, initial encounter: Secondary | ICD-10-CM | POA: Diagnosis not present

## 2017-05-12 DIAGNOSIS — Z1589 Genetic susceptibility to other disease: Secondary | ICD-10-CM | POA: Diagnosis not present

## 2017-05-12 DIAGNOSIS — A0472 Enterocolitis due to Clostridium difficile, not specified as recurrent: Secondary | ICD-10-CM | POA: Diagnosis not present

## 2017-05-12 DIAGNOSIS — D46Z Other myelodysplastic syndromes: Secondary | ICD-10-CM | POA: Diagnosis not present

## 2017-05-12 DIAGNOSIS — D89810 Acute graft-versus-host disease: Secondary | ICD-10-CM | POA: Diagnosis not present

## 2017-05-12 DIAGNOSIS — M8580 Other specified disorders of bone density and structure, unspecified site: Secondary | ICD-10-CM | POA: Diagnosis not present

## 2017-05-12 DIAGNOSIS — Z7682 Awaiting organ transplant status: Secondary | ICD-10-CM | POA: Diagnosis not present

## 2017-05-12 DIAGNOSIS — I10 Essential (primary) hypertension: Secondary | ICD-10-CM | POA: Diagnosis not present

## 2017-05-12 DIAGNOSIS — D469 Myelodysplastic syndrome, unspecified: Secondary | ICD-10-CM | POA: Diagnosis not present

## 2017-05-12 DIAGNOSIS — C9200 Acute myeloblastic leukemia, not having achieved remission: Secondary | ICD-10-CM | POA: Diagnosis not present

## 2017-05-13 DIAGNOSIS — C9200 Acute myeloblastic leukemia, not having achieved remission: Secondary | ICD-10-CM | POA: Diagnosis not present

## 2017-05-13 DIAGNOSIS — K224 Dyskinesia of esophagus: Secondary | ICD-10-CM | POA: Diagnosis not present

## 2017-05-13 DIAGNOSIS — I10 Essential (primary) hypertension: Secondary | ICD-10-CM | POA: Diagnosis present

## 2017-05-13 DIAGNOSIS — K649 Unspecified hemorrhoids: Secondary | ICD-10-CM | POA: Diagnosis present

## 2017-05-13 DIAGNOSIS — R21 Rash and other nonspecific skin eruption: Secondary | ICD-10-CM | POA: Diagnosis present

## 2017-05-13 DIAGNOSIS — S2232XA Fracture of one rib, left side, initial encounter for closed fracture: Secondary | ICD-10-CM | POA: Diagnosis not present

## 2017-05-13 DIAGNOSIS — K644 Residual hemorrhoidal skin tags: Secondary | ICD-10-CM | POA: Diagnosis present

## 2017-05-13 DIAGNOSIS — R739 Hyperglycemia, unspecified: Secondary | ICD-10-CM | POA: Diagnosis present

## 2017-05-13 DIAGNOSIS — R0902 Hypoxemia: Secondary | ICD-10-CM | POA: Diagnosis not present

## 2017-05-13 DIAGNOSIS — Z452 Encounter for adjustment and management of vascular access device: Secondary | ICD-10-CM | POA: Diagnosis not present

## 2017-05-13 DIAGNOSIS — R1310 Dysphagia, unspecified: Secondary | ICD-10-CM | POA: Diagnosis not present

## 2017-05-13 DIAGNOSIS — E8770 Fluid overload, unspecified: Secondary | ICD-10-CM | POA: Diagnosis not present

## 2017-05-13 DIAGNOSIS — R1110 Vomiting, unspecified: Secondary | ICD-10-CM | POA: Diagnosis not present

## 2017-05-13 DIAGNOSIS — D469 Myelodysplastic syndrome, unspecified: Secondary | ICD-10-CM | POA: Diagnosis not present

## 2017-05-13 DIAGNOSIS — Z7952 Long term (current) use of systemic steroids: Secondary | ICD-10-CM | POA: Diagnosis not present

## 2017-05-13 DIAGNOSIS — A0471 Enterocolitis due to Clostridium difficile, recurrent: Secondary | ICD-10-CM | POA: Diagnosis not present

## 2017-05-13 DIAGNOSIS — N179 Acute kidney failure, unspecified: Secondary | ICD-10-CM | POA: Diagnosis present

## 2017-05-13 DIAGNOSIS — Z9221 Personal history of antineoplastic chemotherapy: Secondary | ICD-10-CM | POA: Diagnosis not present

## 2017-05-13 DIAGNOSIS — Z9079 Acquired absence of other genital organ(s): Secondary | ICD-10-CM | POA: Diagnosis not present

## 2017-05-13 DIAGNOSIS — D89810 Acute graft-versus-host disease: Secondary | ICD-10-CM | POA: Diagnosis present

## 2017-05-13 DIAGNOSIS — Z8546 Personal history of malignant neoplasm of prostate: Secondary | ICD-10-CM | POA: Diagnosis not present

## 2017-05-13 DIAGNOSIS — Z9484 Stem cells transplant status: Secondary | ICD-10-CM | POA: Diagnosis not present

## 2017-05-13 DIAGNOSIS — K219 Gastro-esophageal reflux disease without esophagitis: Secondary | ICD-10-CM | POA: Diagnosis present

## 2017-05-13 DIAGNOSIS — G4733 Obstructive sleep apnea (adult) (pediatric): Secondary | ICD-10-CM | POA: Diagnosis present

## 2017-05-13 DIAGNOSIS — T865 Complications of stem cell transplant: Secondary | ICD-10-CM | POA: Diagnosis present

## 2017-05-13 DIAGNOSIS — Z79899 Other long term (current) drug therapy: Secondary | ICD-10-CM | POA: Diagnosis not present

## 2017-05-13 DIAGNOSIS — Z87891 Personal history of nicotine dependence: Secondary | ICD-10-CM | POA: Diagnosis not present

## 2017-05-13 DIAGNOSIS — K225 Diverticulum of esophagus, acquired: Secondary | ICD-10-CM | POA: Diagnosis present

## 2017-05-18 DIAGNOSIS — K219 Gastro-esophageal reflux disease without esophagitis: Secondary | ICD-10-CM | POA: Diagnosis not present

## 2017-05-18 DIAGNOSIS — R197 Diarrhea, unspecified: Secondary | ICD-10-CM | POA: Diagnosis not present

## 2017-05-18 DIAGNOSIS — Z7952 Long term (current) use of systemic steroids: Secondary | ICD-10-CM | POA: Diagnosis not present

## 2017-05-18 DIAGNOSIS — R110 Nausea: Secondary | ICD-10-CM | POA: Diagnosis not present

## 2017-05-18 DIAGNOSIS — A0472 Enterocolitis due to Clostridium difficile, not specified as recurrent: Secondary | ICD-10-CM | POA: Diagnosis not present

## 2017-05-18 DIAGNOSIS — C9201 Acute myeloblastic leukemia, in remission: Secondary | ICD-10-CM | POA: Diagnosis not present

## 2017-05-18 DIAGNOSIS — D469 Myelodysplastic syndrome, unspecified: Secondary | ICD-10-CM | POA: Diagnosis not present

## 2017-05-18 DIAGNOSIS — R140 Abdominal distension (gaseous): Secondary | ICD-10-CM | POA: Diagnosis not present

## 2017-05-18 DIAGNOSIS — Z6825 Body mass index (BMI) 25.0-25.9, adult: Secondary | ICD-10-CM | POA: Diagnosis not present

## 2017-05-18 DIAGNOSIS — M7989 Other specified soft tissue disorders: Secondary | ICD-10-CM | POA: Diagnosis not present

## 2017-05-18 DIAGNOSIS — R1310 Dysphagia, unspecified: Secondary | ICD-10-CM | POA: Diagnosis not present

## 2017-05-18 DIAGNOSIS — R531 Weakness: Secondary | ICD-10-CM | POA: Diagnosis not present

## 2017-05-18 DIAGNOSIS — K649 Unspecified hemorrhoids: Secondary | ICD-10-CM | POA: Diagnosis not present

## 2017-05-18 DIAGNOSIS — I10 Essential (primary) hypertension: Secondary | ICD-10-CM | POA: Diagnosis not present

## 2017-05-18 DIAGNOSIS — R5383 Other fatigue: Secondary | ICD-10-CM | POA: Diagnosis not present

## 2017-05-18 DIAGNOSIS — D89810 Acute graft-versus-host disease: Secondary | ICD-10-CM | POA: Diagnosis not present

## 2017-05-18 DIAGNOSIS — Z8546 Personal history of malignant neoplasm of prostate: Secondary | ICD-10-CM | POA: Diagnosis not present

## 2017-05-18 DIAGNOSIS — Z85820 Personal history of malignant melanoma of skin: Secondary | ICD-10-CM | POA: Diagnosis not present

## 2017-05-18 DIAGNOSIS — D4701 Cutaneous mastocytosis: Secondary | ICD-10-CM | POA: Diagnosis not present

## 2017-05-18 DIAGNOSIS — R52 Pain, unspecified: Secondary | ICD-10-CM | POA: Diagnosis not present

## 2017-05-18 DIAGNOSIS — R739 Hyperglycemia, unspecified: Secondary | ICD-10-CM | POA: Diagnosis not present

## 2017-05-18 DIAGNOSIS — R7881 Bacteremia: Secondary | ICD-10-CM | POA: Diagnosis not present

## 2017-05-19 DIAGNOSIS — Z8546 Personal history of malignant neoplasm of prostate: Secondary | ICD-10-CM | POA: Diagnosis not present

## 2017-05-19 DIAGNOSIS — Q396 Congenital diverticulum of esophagus: Secondary | ICD-10-CM | POA: Diagnosis not present

## 2017-05-19 DIAGNOSIS — R739 Hyperglycemia, unspecified: Secondary | ICD-10-CM | POA: Diagnosis not present

## 2017-05-19 DIAGNOSIS — D46Z Other myelodysplastic syndromes: Secondary | ICD-10-CM | POA: Diagnosis not present

## 2017-05-19 DIAGNOSIS — I10 Essential (primary) hypertension: Secondary | ICD-10-CM | POA: Diagnosis not present

## 2017-05-19 DIAGNOSIS — R21 Rash and other nonspecific skin eruption: Secondary | ICD-10-CM | POA: Diagnosis not present

## 2017-05-19 DIAGNOSIS — D89810 Acute graft-versus-host disease: Secondary | ICD-10-CM | POA: Diagnosis not present

## 2017-05-19 DIAGNOSIS — K219 Gastro-esophageal reflux disease without esophagitis: Secondary | ICD-10-CM | POA: Diagnosis not present

## 2017-05-19 DIAGNOSIS — T380X5D Adverse effect of glucocorticoids and synthetic analogues, subsequent encounter: Secondary | ICD-10-CM | POA: Diagnosis not present

## 2017-05-19 DIAGNOSIS — T865 Complications of stem cell transplant: Secondary | ICD-10-CM | POA: Diagnosis not present

## 2017-05-19 DIAGNOSIS — Z7983 Long term (current) use of bisphosphonates: Secondary | ICD-10-CM | POA: Diagnosis not present

## 2017-05-19 DIAGNOSIS — R600 Localized edema: Secondary | ICD-10-CM | POA: Diagnosis not present

## 2017-05-19 DIAGNOSIS — Z9221 Personal history of antineoplastic chemotherapy: Secondary | ICD-10-CM | POA: Diagnosis not present

## 2017-05-19 DIAGNOSIS — R251 Tremor, unspecified: Secondary | ICD-10-CM | POA: Diagnosis not present

## 2017-05-19 DIAGNOSIS — R197 Diarrhea, unspecified: Secondary | ICD-10-CM | POA: Diagnosis not present

## 2017-05-19 DIAGNOSIS — K224 Dyskinesia of esophagus: Secondary | ICD-10-CM | POA: Diagnosis not present

## 2017-05-19 DIAGNOSIS — Z6825 Body mass index (BMI) 25.0-25.9, adult: Secondary | ICD-10-CM | POA: Diagnosis not present

## 2017-05-19 DIAGNOSIS — C9201 Acute myeloblastic leukemia, in remission: Secondary | ICD-10-CM | POA: Diagnosis not present

## 2017-05-19 DIAGNOSIS — Z9484 Stem cells transplant status: Secondary | ICD-10-CM | POA: Diagnosis not present

## 2017-05-19 DIAGNOSIS — C9200 Acute myeloblastic leukemia, not having achieved remission: Secondary | ICD-10-CM | POA: Diagnosis not present

## 2017-05-19 DIAGNOSIS — Z85820 Personal history of malignant melanoma of skin: Secondary | ICD-10-CM | POA: Diagnosis not present

## 2017-05-19 DIAGNOSIS — Z8619 Personal history of other infectious and parasitic diseases: Secondary | ICD-10-CM | POA: Diagnosis not present

## 2017-05-19 DIAGNOSIS — D849 Immunodeficiency, unspecified: Secondary | ICD-10-CM | POA: Diagnosis not present

## 2017-05-19 DIAGNOSIS — R110 Nausea: Secondary | ICD-10-CM | POA: Diagnosis not present

## 2017-05-19 DIAGNOSIS — K649 Unspecified hemorrhoids: Secondary | ICD-10-CM | POA: Diagnosis not present

## 2017-05-19 DIAGNOSIS — Z9079 Acquired absence of other genital organ(s): Secondary | ICD-10-CM | POA: Diagnosis not present

## 2017-05-19 DIAGNOSIS — Z9225 Personal history of immunosupression therapy: Secondary | ICD-10-CM | POA: Diagnosis not present

## 2017-05-22 DIAGNOSIS — Z8614 Personal history of Methicillin resistant Staphylococcus aureus infection: Secondary | ICD-10-CM | POA: Diagnosis not present

## 2017-05-22 DIAGNOSIS — T865 Complications of stem cell transplant: Secondary | ICD-10-CM | POA: Diagnosis not present

## 2017-05-22 DIAGNOSIS — Z7952 Long term (current) use of systemic steroids: Secondary | ICD-10-CM | POA: Diagnosis not present

## 2017-05-22 DIAGNOSIS — R601 Generalized edema: Secondary | ICD-10-CM | POA: Diagnosis not present

## 2017-05-22 DIAGNOSIS — Z9079 Acquired absence of other genital organ(s): Secondary | ICD-10-CM | POA: Diagnosis not present

## 2017-05-22 DIAGNOSIS — Z79899 Other long term (current) drug therapy: Secondary | ICD-10-CM | POA: Diagnosis not present

## 2017-05-22 DIAGNOSIS — R197 Diarrhea, unspecified: Secondary | ICD-10-CM | POA: Diagnosis not present

## 2017-05-22 DIAGNOSIS — D469 Myelodysplastic syndrome, unspecified: Secondary | ICD-10-CM | POA: Diagnosis not present

## 2017-05-22 DIAGNOSIS — D89810 Acute graft-versus-host disease: Secondary | ICD-10-CM | POA: Diagnosis not present

## 2017-05-22 DIAGNOSIS — A0472 Enterocolitis due to Clostridium difficile, not specified as recurrent: Secondary | ICD-10-CM | POA: Diagnosis not present

## 2017-05-22 DIAGNOSIS — T380X5A Adverse effect of glucocorticoids and synthetic analogues, initial encounter: Secondary | ICD-10-CM | POA: Diagnosis not present

## 2017-05-22 DIAGNOSIS — R21 Rash and other nonspecific skin eruption: Secondary | ICD-10-CM | POA: Diagnosis not present

## 2017-05-22 DIAGNOSIS — K649 Unspecified hemorrhoids: Secondary | ICD-10-CM | POA: Diagnosis not present

## 2017-05-22 DIAGNOSIS — M7989 Other specified soft tissue disorders: Secondary | ICD-10-CM | POA: Diagnosis not present

## 2017-05-22 DIAGNOSIS — R110 Nausea: Secondary | ICD-10-CM | POA: Diagnosis not present

## 2017-05-22 DIAGNOSIS — Z85820 Personal history of malignant melanoma of skin: Secondary | ICD-10-CM | POA: Diagnosis not present

## 2017-05-22 DIAGNOSIS — I10 Essential (primary) hypertension: Secondary | ICD-10-CM | POA: Diagnosis not present

## 2017-05-22 DIAGNOSIS — R739 Hyperglycemia, unspecified: Secondary | ICD-10-CM | POA: Diagnosis not present

## 2017-05-22 DIAGNOSIS — Z8546 Personal history of malignant neoplasm of prostate: Secondary | ICD-10-CM | POA: Diagnosis not present

## 2017-05-22 DIAGNOSIS — Z6825 Body mass index (BMI) 25.0-25.9, adult: Secondary | ICD-10-CM | POA: Diagnosis not present

## 2017-05-22 DIAGNOSIS — C9200 Acute myeloblastic leukemia, not having achieved remission: Secondary | ICD-10-CM | POA: Diagnosis not present

## 2017-05-24 DIAGNOSIS — K219 Gastro-esophageal reflux disease without esophagitis: Secondary | ICD-10-CM | POA: Diagnosis not present

## 2017-05-24 DIAGNOSIS — R739 Hyperglycemia, unspecified: Secondary | ICD-10-CM | POA: Diagnosis not present

## 2017-05-24 DIAGNOSIS — D89810 Acute graft-versus-host disease: Secondary | ICD-10-CM | POA: Diagnosis not present

## 2017-05-24 DIAGNOSIS — Z85820 Personal history of malignant melanoma of skin: Secondary | ICD-10-CM | POA: Diagnosis not present

## 2017-05-24 DIAGNOSIS — Z6825 Body mass index (BMI) 25.0-25.9, adult: Secondary | ICD-10-CM | POA: Diagnosis not present

## 2017-05-24 DIAGNOSIS — S90821A Blister (nonthermal), right foot, initial encounter: Secondary | ICD-10-CM | POA: Diagnosis not present

## 2017-05-24 DIAGNOSIS — G720 Drug-induced myopathy: Secondary | ICD-10-CM | POA: Diagnosis not present

## 2017-05-24 DIAGNOSIS — R601 Generalized edema: Secondary | ICD-10-CM | POA: Diagnosis not present

## 2017-05-24 DIAGNOSIS — S80811A Abrasion, right lower leg, initial encounter: Secondary | ICD-10-CM | POA: Diagnosis not present

## 2017-05-24 DIAGNOSIS — I10 Essential (primary) hypertension: Secondary | ICD-10-CM | POA: Diagnosis not present

## 2017-05-24 DIAGNOSIS — R12 Heartburn: Secondary | ICD-10-CM | POA: Diagnosis not present

## 2017-05-24 DIAGNOSIS — Z9484 Stem cells transplant status: Secondary | ICD-10-CM | POA: Diagnosis not present

## 2017-05-24 DIAGNOSIS — C9200 Acute myeloblastic leukemia, not having achieved remission: Secondary | ICD-10-CM | POA: Diagnosis not present

## 2017-05-24 DIAGNOSIS — Z79899 Other long term (current) drug therapy: Secondary | ICD-10-CM | POA: Diagnosis not present

## 2017-05-24 DIAGNOSIS — C9201 Acute myeloblastic leukemia, in remission: Secondary | ICD-10-CM | POA: Diagnosis not present

## 2017-05-24 DIAGNOSIS — R600 Localized edema: Secondary | ICD-10-CM | POA: Diagnosis not present

## 2017-05-24 DIAGNOSIS — K649 Unspecified hemorrhoids: Secondary | ICD-10-CM | POA: Diagnosis not present

## 2017-05-24 DIAGNOSIS — R000 Tachycardia, unspecified: Secondary | ICD-10-CM | POA: Diagnosis not present

## 2017-05-24 DIAGNOSIS — T865 Complications of stem cell transplant: Secondary | ICD-10-CM | POA: Diagnosis not present

## 2017-05-24 DIAGNOSIS — Z7952 Long term (current) use of systemic steroids: Secondary | ICD-10-CM | POA: Diagnosis not present

## 2017-05-24 DIAGNOSIS — Z9079 Acquired absence of other genital organ(s): Secondary | ICD-10-CM | POA: Diagnosis not present

## 2017-05-24 DIAGNOSIS — R197 Diarrhea, unspecified: Secondary | ICD-10-CM | POA: Diagnosis not present

## 2017-05-24 DIAGNOSIS — R251 Tremor, unspecified: Secondary | ICD-10-CM | POA: Diagnosis not present

## 2017-05-24 DIAGNOSIS — T380X5A Adverse effect of glucocorticoids and synthetic analogues, initial encounter: Secondary | ICD-10-CM | POA: Diagnosis not present

## 2017-05-24 DIAGNOSIS — A0472 Enterocolitis due to Clostridium difficile, not specified as recurrent: Secondary | ICD-10-CM | POA: Diagnosis not present

## 2017-05-24 DIAGNOSIS — Z8546 Personal history of malignant neoplasm of prostate: Secondary | ICD-10-CM | POA: Diagnosis not present

## 2017-05-24 DIAGNOSIS — R21 Rash and other nonspecific skin eruption: Secondary | ICD-10-CM | POA: Diagnosis not present

## 2017-05-24 DIAGNOSIS — R110 Nausea: Secondary | ICD-10-CM | POA: Diagnosis not present

## 2017-05-26 DIAGNOSIS — R197 Diarrhea, unspecified: Secondary | ICD-10-CM | POA: Diagnosis not present

## 2017-05-26 DIAGNOSIS — Z85820 Personal history of malignant melanoma of skin: Secondary | ICD-10-CM | POA: Diagnosis not present

## 2017-05-26 DIAGNOSIS — R601 Generalized edema: Secondary | ICD-10-CM | POA: Diagnosis not present

## 2017-05-26 DIAGNOSIS — T380X5A Adverse effect of glucocorticoids and synthetic analogues, initial encounter: Secondary | ICD-10-CM | POA: Diagnosis not present

## 2017-05-26 DIAGNOSIS — R1310 Dysphagia, unspecified: Secondary | ICD-10-CM | POA: Diagnosis not present

## 2017-05-26 DIAGNOSIS — R21 Rash and other nonspecific skin eruption: Secondary | ICD-10-CM | POA: Diagnosis not present

## 2017-05-26 DIAGNOSIS — M7989 Other specified soft tissue disorders: Secondary | ICD-10-CM | POA: Diagnosis not present

## 2017-05-26 DIAGNOSIS — Z8546 Personal history of malignant neoplasm of prostate: Secondary | ICD-10-CM | POA: Diagnosis not present

## 2017-05-26 DIAGNOSIS — R739 Hyperglycemia, unspecified: Secondary | ICD-10-CM | POA: Diagnosis not present

## 2017-05-26 DIAGNOSIS — S80811A Abrasion, right lower leg, initial encounter: Secondary | ICD-10-CM | POA: Diagnosis not present

## 2017-05-26 DIAGNOSIS — C9200 Acute myeloblastic leukemia, not having achieved remission: Secondary | ICD-10-CM | POA: Diagnosis not present

## 2017-05-26 DIAGNOSIS — Z7952 Long term (current) use of systemic steroids: Secondary | ICD-10-CM | POA: Diagnosis not present

## 2017-05-26 DIAGNOSIS — Z794 Long term (current) use of insulin: Secondary | ICD-10-CM | POA: Diagnosis not present

## 2017-05-26 DIAGNOSIS — D469 Myelodysplastic syndrome, unspecified: Secondary | ICD-10-CM | POA: Diagnosis not present

## 2017-05-26 DIAGNOSIS — K649 Unspecified hemorrhoids: Secondary | ICD-10-CM | POA: Diagnosis not present

## 2017-05-26 DIAGNOSIS — Z888 Allergy status to other drugs, medicaments and biological substances status: Secondary | ICD-10-CM | POA: Diagnosis not present

## 2017-05-26 DIAGNOSIS — D46Z Other myelodysplastic syndromes: Secondary | ICD-10-CM | POA: Diagnosis not present

## 2017-05-26 DIAGNOSIS — N179 Acute kidney failure, unspecified: Secondary | ICD-10-CM | POA: Diagnosis not present

## 2017-05-26 DIAGNOSIS — I10 Essential (primary) hypertension: Secondary | ICD-10-CM | POA: Diagnosis not present

## 2017-05-26 DIAGNOSIS — K225 Diverticulum of esophagus, acquired: Secondary | ICD-10-CM | POA: Diagnosis not present

## 2017-05-26 DIAGNOSIS — S90821A Blister (nonthermal), right foot, initial encounter: Secondary | ICD-10-CM | POA: Diagnosis not present

## 2017-05-26 DIAGNOSIS — A0472 Enterocolitis due to Clostridium difficile, not specified as recurrent: Secondary | ICD-10-CM | POA: Diagnosis not present

## 2017-05-26 DIAGNOSIS — Z6825 Body mass index (BMI) 25.0-25.9, adult: Secondary | ICD-10-CM | POA: Diagnosis not present

## 2017-05-26 DIAGNOSIS — K224 Dyskinesia of esophagus: Secondary | ICD-10-CM | POA: Diagnosis not present

## 2017-05-26 DIAGNOSIS — R112 Nausea with vomiting, unspecified: Secondary | ICD-10-CM | POA: Diagnosis not present

## 2017-05-26 DIAGNOSIS — R600 Localized edema: Secondary | ICD-10-CM | POA: Diagnosis not present

## 2017-05-26 DIAGNOSIS — Z9484 Stem cells transplant status: Secondary | ICD-10-CM | POA: Diagnosis not present

## 2017-05-26 DIAGNOSIS — D89810 Acute graft-versus-host disease: Secondary | ICD-10-CM | POA: Diagnosis not present

## 2017-05-29 ENCOUNTER — Ambulatory Visit
Admission: RE | Admit: 2017-05-29 | Discharge: 2017-05-29 | Disposition: A | Discharge status: Home / Self Care | Payer: MEDICARE

## 2017-05-29 ENCOUNTER — Ambulatory Visit
Admission: RE | Admit: 2017-05-29 | Discharge: 2017-05-29 | Disposition: A | Discharge status: Home / Self Care | Payer: MEDICARE | Attending: Primary Care | Admitting: Primary Care

## 2017-05-29 DIAGNOSIS — D469 Myelodysplastic syndrome, unspecified: Principal | ICD-10-CM

## 2017-05-29 DIAGNOSIS — K224 Dyskinesia of esophagus: Secondary | ICD-10-CM

## 2017-05-29 DIAGNOSIS — R1310 Dysphagia, unspecified: Principal | ICD-10-CM

## 2017-05-29 DIAGNOSIS — R0602 Shortness of breath: Secondary | ICD-10-CM

## 2017-05-29 DIAGNOSIS — C9200 Acute myeloblastic leukemia, not having achieved remission: Secondary | ICD-10-CM

## 2017-05-29 DIAGNOSIS — D46Z Other myelodysplastic syndromes: Secondary | ICD-10-CM

## 2017-05-29 DIAGNOSIS — R739 Hyperglycemia, unspecified: Secondary | ICD-10-CM

## 2017-05-29 DIAGNOSIS — R600 Localized edema: Secondary | ICD-10-CM

## 2017-05-29 DIAGNOSIS — A0472 Enterocolitis due to Clostridium difficile, not specified as recurrent: Secondary | ICD-10-CM

## 2017-05-29 DIAGNOSIS — D89810 Acute graft-versus-host disease: Secondary | ICD-10-CM

## 2017-05-29 DIAGNOSIS — N179 Acute kidney failure, unspecified: Secondary | ICD-10-CM

## 2017-05-29 DIAGNOSIS — T380X5A Adverse effect of glucocorticoids and synthetic analogues, initial encounter: Secondary | ICD-10-CM

## 2017-05-29 DIAGNOSIS — R110 Nausea: Secondary | ICD-10-CM | POA: Diagnosis not present

## 2017-05-29 DIAGNOSIS — M7989 Other specified soft tissue disorders: Secondary | ICD-10-CM | POA: Diagnosis not present

## 2017-05-29 DIAGNOSIS — R21 Rash and other nonspecific skin eruption: Secondary | ICD-10-CM | POA: Diagnosis not present

## 2017-05-29 DIAGNOSIS — I10 Essential (primary) hypertension: Secondary | ICD-10-CM | POA: Diagnosis not present

## 2017-05-29 DIAGNOSIS — T865 Complications of stem cell transplant: Secondary | ICD-10-CM | POA: Diagnosis not present

## 2017-05-29 DIAGNOSIS — Z79899 Other long term (current) drug therapy: Secondary | ICD-10-CM | POA: Diagnosis not present

## 2017-05-29 MED ORDER — FUROSEMIDE 20 MG TABLET
ORAL_TABLET | Freq: Every day | ORAL | 11 refills | 0 days | PRN
Start: 2017-05-29 — End: 2017-06-16

## 2017-05-29 MED ORDER — POSACONAZOLE 100 MG TABLET,DELAYED RELEASE
ORAL_TABLET | Freq: Every day | ORAL | 5 refills | 0.00000 days | Status: CP
Start: 2017-05-29 — End: 2017-10-26

## 2017-05-29 MED ORDER — DILTIAZEM 60 MG TABLET
ORAL_TABLET | Freq: Three times a day (TID) | ORAL | 3 refills | 0.00000 days | Status: CP
Start: 2017-05-29 — End: 2017-07-09

## 2017-05-29 MED FILL — DILTIAZEM/60MG/TAB: DILTIAZEM/60MG/TAB | 30 days supply | Qty: 90 | Fill #0

## 2017-05-29 MED FILL — LORATADINE/10MG/TABS: LORATADINE/10MG/TABS | 30 days supply | Qty: 30 | Fill #1

## 2017-05-30 MED ORDER — PREDNISONE 20 MG TABLET
ORAL_TABLET | Freq: Every day | ORAL | 0 refills | 0 days
Start: 2017-05-30 — End: 2017-06-05

## 2017-06-01 ENCOUNTER — Ambulatory Visit: Admission: RE | Admit: 2017-06-01 | Discharge: 2017-06-01 | Disposition: A | Discharge status: Home / Self Care

## 2017-06-01 ENCOUNTER — Ambulatory Visit
Admission: RE | Admit: 2017-06-01 | Discharge: 2017-06-01 | Disposition: A | Discharge status: Home / Self Care | Payer: MEDICARE

## 2017-06-01 DIAGNOSIS — D89813 Graft-versus-host disease, unspecified: Secondary | ICD-10-CM

## 2017-06-01 DIAGNOSIS — T380X5A Adverse effect of glucocorticoids and synthetic analogues, initial encounter: Secondary | ICD-10-CM

## 2017-06-01 DIAGNOSIS — R21 Rash and other nonspecific skin eruption: Secondary | ICD-10-CM

## 2017-06-01 DIAGNOSIS — E8770 Fluid overload, unspecified: Secondary | ICD-10-CM

## 2017-06-01 DIAGNOSIS — K224 Dyskinesia of esophagus: Principal | ICD-10-CM

## 2017-06-01 DIAGNOSIS — N179 Acute kidney failure, unspecified: Secondary | ICD-10-CM

## 2017-06-01 DIAGNOSIS — D469 Myelodysplastic syndrome, unspecified: Secondary | ICD-10-CM

## 2017-06-01 DIAGNOSIS — D46Z Other myelodysplastic syndromes: Principal | ICD-10-CM

## 2017-06-01 DIAGNOSIS — R601 Generalized edema: Secondary | ICD-10-CM

## 2017-06-01 DIAGNOSIS — E46 Unspecified protein-calorie malnutrition: Secondary | ICD-10-CM

## 2017-06-01 DIAGNOSIS — E099 Drug or chemical induced diabetes mellitus without complications: Secondary | ICD-10-CM

## 2017-06-01 DIAGNOSIS — S90829A Blister (nonthermal), unspecified foot, initial encounter: Secondary | ICD-10-CM | POA: Diagnosis not present

## 2017-06-01 DIAGNOSIS — D89810 Acute graft-versus-host disease: Secondary | ICD-10-CM | POA: Diagnosis not present

## 2017-06-01 DIAGNOSIS — R110 Nausea: Secondary | ICD-10-CM | POA: Diagnosis not present

## 2017-06-01 DIAGNOSIS — K219 Gastro-esophageal reflux disease without esophagitis: Secondary | ICD-10-CM | POA: Diagnosis not present

## 2017-06-01 DIAGNOSIS — C9201 Acute myeloblastic leukemia, in remission: Secondary | ICD-10-CM | POA: Diagnosis not present

## 2017-06-01 DIAGNOSIS — K222 Esophageal obstruction: Secondary | ICD-10-CM | POA: Diagnosis not present

## 2017-06-01 DIAGNOSIS — M7989 Other specified soft tissue disorders: Secondary | ICD-10-CM | POA: Diagnosis not present

## 2017-06-01 DIAGNOSIS — T865 Complications of stem cell transplant: Secondary | ICD-10-CM | POA: Diagnosis not present

## 2017-06-01 DIAGNOSIS — Z7952 Long term (current) use of systemic steroids: Secondary | ICD-10-CM | POA: Diagnosis not present

## 2017-06-01 DIAGNOSIS — Z6824 Body mass index (BMI) 24.0-24.9, adult: Secondary | ICD-10-CM | POA: Diagnosis not present

## 2017-06-01 DIAGNOSIS — R079 Chest pain, unspecified: Secondary | ICD-10-CM | POA: Diagnosis not present

## 2017-06-01 DIAGNOSIS — R1314 Dysphagia, pharyngoesophageal phase: Secondary | ICD-10-CM | POA: Diagnosis not present

## 2017-06-01 DIAGNOSIS — K649 Unspecified hemorrhoids: Secondary | ICD-10-CM | POA: Diagnosis not present

## 2017-06-01 DIAGNOSIS — Z79899 Other long term (current) drug therapy: Secondary | ICD-10-CM | POA: Diagnosis not present

## 2017-06-01 DIAGNOSIS — R197 Diarrhea, unspecified: Secondary | ICD-10-CM | POA: Diagnosis not present

## 2017-06-01 DIAGNOSIS — I10 Essential (primary) hypertension: Secondary | ICD-10-CM | POA: Diagnosis not present

## 2017-06-01 MED ORDER — INSULIN GLARGINE (U-100) 100 UNIT/ML (3 ML) SUBCUTANEOUS PEN
PEN_INJECTOR | Freq: Every evening | SUBCUTANEOUS | 11 refills | 0.00000 days | Status: CP
Start: 2017-06-01 — End: 2017-06-12

## 2017-06-02 ENCOUNTER — Ambulatory Visit
Admission: RE | Admit: 2017-06-02 | Discharge: 2017-06-02 | Disposition: A | Discharge status: Home / Self Care | Payer: MEDICARE | Attending: Primary Care | Admitting: Primary Care

## 2017-06-02 DIAGNOSIS — R1310 Dysphagia, unspecified: Secondary | ICD-10-CM | POA: Diagnosis not present

## 2017-06-02 DIAGNOSIS — K224 Dyskinesia of esophagus: Secondary | ICD-10-CM | POA: Diagnosis not present

## 2017-06-02 MED ORDER — TACROLIMUS 0.5 MG CAPSULE
ORAL_CAPSULE | Freq: Every day | ORAL | 6 refills | 0.00000 days | Status: CP
Start: 2017-06-02 — End: 2017-06-05

## 2017-06-05 ENCOUNTER — Ambulatory Visit
Admission: RE | Admit: 2017-06-05 | Discharge: 2017-06-05 | Disposition: A | Discharge status: Home / Self Care | Payer: MEDICARE | Attending: Primary Care | Admitting: Primary Care

## 2017-06-05 ENCOUNTER — Ambulatory Visit
Admission: RE | Admit: 2017-06-05 | Discharge: 2017-06-05 | Disposition: A | Discharge status: Home / Self Care | Payer: MEDICARE

## 2017-06-05 DIAGNOSIS — G720 Drug-induced myopathy: Secondary | ICD-10-CM

## 2017-06-05 DIAGNOSIS — R0602 Shortness of breath: Principal | ICD-10-CM

## 2017-06-05 DIAGNOSIS — T380X5A Adverse effect of glucocorticoids and synthetic analogues, initial encounter: Secondary | ICD-10-CM

## 2017-06-05 DIAGNOSIS — C9200 Acute myeloblastic leukemia, not having achieved remission: Secondary | ICD-10-CM

## 2017-06-05 DIAGNOSIS — D469 Myelodysplastic syndrome, unspecified: Principal | ICD-10-CM

## 2017-06-05 DIAGNOSIS — D46Z Other myelodysplastic syndromes: Secondary | ICD-10-CM

## 2017-06-05 DIAGNOSIS — D89813 Graft-versus-host disease, unspecified: Secondary | ICD-10-CM

## 2017-06-05 DIAGNOSIS — R600 Localized edema: Secondary | ICD-10-CM

## 2017-06-05 DIAGNOSIS — K224 Dyskinesia of esophagus: Secondary | ICD-10-CM

## 2017-06-05 DIAGNOSIS — E099 Drug or chemical induced diabetes mellitus without complications: Secondary | ICD-10-CM

## 2017-06-05 DIAGNOSIS — Z8546 Personal history of malignant neoplasm of prostate: Secondary | ICD-10-CM | POA: Diagnosis not present

## 2017-06-05 DIAGNOSIS — D89810 Acute graft-versus-host disease: Secondary | ICD-10-CM | POA: Diagnosis not present

## 2017-06-05 DIAGNOSIS — T865 Complications of stem cell transplant: Secondary | ICD-10-CM | POA: Diagnosis not present

## 2017-06-05 DIAGNOSIS — I517 Cardiomegaly: Secondary | ICD-10-CM | POA: Diagnosis not present

## 2017-06-05 DIAGNOSIS — S90821A Blister (nonthermal), right foot, initial encounter: Secondary | ICD-10-CM | POA: Diagnosis not present

## 2017-06-05 DIAGNOSIS — I340 Nonrheumatic mitral (valve) insufficiency: Secondary | ICD-10-CM | POA: Diagnosis not present

## 2017-06-05 DIAGNOSIS — I10 Essential (primary) hypertension: Secondary | ICD-10-CM | POA: Diagnosis not present

## 2017-06-05 DIAGNOSIS — S90822A Blister (nonthermal), left foot, initial encounter: Secondary | ICD-10-CM | POA: Diagnosis not present

## 2017-06-05 DIAGNOSIS — I700 Atherosclerosis of aorta: Secondary | ICD-10-CM | POA: Diagnosis not present

## 2017-06-05 DIAGNOSIS — R21 Rash and other nonspecific skin eruption: Secondary | ICD-10-CM | POA: Diagnosis not present

## 2017-06-05 DIAGNOSIS — Z9079 Acquired absence of other genital organ(s): Secondary | ICD-10-CM | POA: Diagnosis not present

## 2017-06-05 DIAGNOSIS — I059 Rheumatic mitral valve disease, unspecified: Secondary | ICD-10-CM | POA: Diagnosis not present

## 2017-06-05 DIAGNOSIS — R1310 Dysphagia, unspecified: Secondary | ICD-10-CM | POA: Diagnosis not present

## 2017-06-05 DIAGNOSIS — T380X5D Adverse effect of glucocorticoids and synthetic analogues, subsequent encounter: Secondary | ICD-10-CM | POA: Diagnosis not present

## 2017-06-05 DIAGNOSIS — K219 Gastro-esophageal reflux disease without esophagitis: Secondary | ICD-10-CM | POA: Diagnosis not present

## 2017-06-05 DIAGNOSIS — R197 Diarrhea, unspecified: Secondary | ICD-10-CM | POA: Diagnosis not present

## 2017-06-05 DIAGNOSIS — Z6823 Body mass index (BMI) 23.0-23.9, adult: Secondary | ICD-10-CM | POA: Diagnosis not present

## 2017-06-05 DIAGNOSIS — R739 Hyperglycemia, unspecified: Secondary | ICD-10-CM | POA: Diagnosis not present

## 2017-06-05 DIAGNOSIS — R110 Nausea: Secondary | ICD-10-CM | POA: Diagnosis not present

## 2017-06-05 MED ORDER — TACROLIMUS 0.5 MG CAPSULE
ORAL_CAPSULE | Freq: Two times a day (BID) | ORAL | 6 refills | 0 days
Start: 2017-06-05 — End: 2017-06-08

## 2017-06-05 MED ORDER — PREDNISONE 20 MG TABLET
ORAL_TABLET | 0 refills | 0 days
Start: 2017-06-05 — End: 2017-06-08

## 2017-06-08 ENCOUNTER — Ambulatory Visit
Admission: RE | Admit: 2017-06-08 | Discharge: 2017-06-08 | Disposition: A | Discharge status: Home / Self Care | Payer: MEDICARE

## 2017-06-08 ENCOUNTER — Ambulatory Visit
Admission: RE | Admit: 2017-06-08 | Discharge: 2017-06-08 | Disposition: A | Discharge status: Home / Self Care | Payer: MEDICARE | Attending: Oncology | Admitting: Oncology

## 2017-06-08 ENCOUNTER — Ambulatory Visit: Admission: RE | Admit: 2017-06-08 | Discharge: 2017-06-08 | Disposition: A | Discharge status: Home / Self Care

## 2017-06-08 DIAGNOSIS — R739 Hyperglycemia, unspecified: Principal | ICD-10-CM

## 2017-06-08 DIAGNOSIS — T380X5A Adverse effect of glucocorticoids and synthetic analogues, initial encounter: Secondary | ICD-10-CM

## 2017-06-08 DIAGNOSIS — R0602 Shortness of breath: Principal | ICD-10-CM

## 2017-06-08 DIAGNOSIS — Z9484 Stem cells transplant status: Secondary | ICD-10-CM

## 2017-06-08 DIAGNOSIS — D46Z Other myelodysplastic syndromes: Principal | ICD-10-CM

## 2017-06-08 DIAGNOSIS — D469 Myelodysplastic syndrome, unspecified: Secondary | ICD-10-CM

## 2017-06-08 DIAGNOSIS — B9689 Other specified bacterial agents as the cause of diseases classified elsewhere: Secondary | ICD-10-CM | POA: Diagnosis not present

## 2017-06-08 DIAGNOSIS — C9200 Acute myeloblastic leukemia, not having achieved remission: Secondary | ICD-10-CM | POA: Diagnosis not present

## 2017-06-08 DIAGNOSIS — K649 Unspecified hemorrhoids: Secondary | ICD-10-CM | POA: Diagnosis not present

## 2017-06-08 DIAGNOSIS — I10 Essential (primary) hypertension: Secondary | ICD-10-CM | POA: Diagnosis not present

## 2017-06-08 DIAGNOSIS — T8609 Other complications of bone marrow transplant: Secondary | ICD-10-CM | POA: Diagnosis not present

## 2017-06-08 DIAGNOSIS — Z8619 Personal history of other infectious and parasitic diseases: Secondary | ICD-10-CM | POA: Diagnosis not present

## 2017-06-08 DIAGNOSIS — R531 Weakness: Secondary | ICD-10-CM | POA: Diagnosis not present

## 2017-06-08 DIAGNOSIS — R600 Localized edema: Secondary | ICD-10-CM | POA: Diagnosis not present

## 2017-06-08 DIAGNOSIS — Z85820 Personal history of malignant melanoma of skin: Secondary | ICD-10-CM | POA: Diagnosis not present

## 2017-06-08 DIAGNOSIS — K219 Gastro-esophageal reflux disease without esophagitis: Secondary | ICD-10-CM | POA: Diagnosis not present

## 2017-06-08 DIAGNOSIS — R197 Diarrhea, unspecified: Secondary | ICD-10-CM | POA: Diagnosis not present

## 2017-06-08 DIAGNOSIS — R1310 Dysphagia, unspecified: Secondary | ICD-10-CM | POA: Diagnosis not present

## 2017-06-08 DIAGNOSIS — Z79899 Other long term (current) drug therapy: Secondary | ICD-10-CM | POA: Diagnosis not present

## 2017-06-08 DIAGNOSIS — Z8546 Personal history of malignant neoplasm of prostate: Secondary | ICD-10-CM | POA: Diagnosis not present

## 2017-06-08 DIAGNOSIS — Z9079 Acquired absence of other genital organ(s): Secondary | ICD-10-CM | POA: Diagnosis not present

## 2017-06-08 DIAGNOSIS — Z6823 Body mass index (BMI) 23.0-23.9, adult: Secondary | ICD-10-CM | POA: Diagnosis not present

## 2017-06-08 DIAGNOSIS — D89810 Acute graft-versus-host disease: Secondary | ICD-10-CM | POA: Diagnosis not present

## 2017-06-08 DIAGNOSIS — Z794 Long term (current) use of insulin: Secondary | ICD-10-CM | POA: Diagnosis not present

## 2017-06-08 DIAGNOSIS — T380X5D Adverse effect of glucocorticoids and synthetic analogues, subsequent encounter: Secondary | ICD-10-CM | POA: Diagnosis not present

## 2017-06-08 MED ORDER — TACROLIMUS 0.5 MG CAPSULE
ORAL_CAPSULE | 6 refills | 0 days
Start: 2017-06-08 — End: 2017-07-09

## 2017-06-08 MED ORDER — PREDNISONE 20 MG TABLET
ORAL_TABLET | 0 refills | 0 days
Start: 2017-06-08 — End: 2017-06-12

## 2017-06-12 ENCOUNTER — Ambulatory Visit
Admission: RE | Admit: 2017-06-12 | Discharge: 2017-06-12 | Disposition: A | Discharge status: Home / Self Care | Payer: MEDICARE

## 2017-06-12 ENCOUNTER — Ambulatory Visit
Admission: RE | Admit: 2017-06-12 | Discharge: 2017-06-12 | Disposition: A | Discharge status: Home / Self Care | Payer: MEDICARE | Attending: Primary Care | Admitting: Primary Care

## 2017-06-12 DIAGNOSIS — R0602 Shortness of breath: Principal | ICD-10-CM

## 2017-06-12 DIAGNOSIS — D469 Myelodysplastic syndrome, unspecified: Secondary | ICD-10-CM

## 2017-06-12 DIAGNOSIS — C9200 Acute myeloblastic leukemia, not having achieved remission: Secondary | ICD-10-CM

## 2017-06-12 DIAGNOSIS — D46Z Other myelodysplastic syndromes: Principal | ICD-10-CM

## 2017-06-12 DIAGNOSIS — R0902 Hypoxemia: Secondary | ICD-10-CM

## 2017-06-12 DIAGNOSIS — R739 Hyperglycemia, unspecified: Secondary | ICD-10-CM

## 2017-06-12 DIAGNOSIS — R600 Localized edema: Secondary | ICD-10-CM

## 2017-06-12 DIAGNOSIS — T380X5A Adverse effect of glucocorticoids and synthetic analogues, initial encounter: Secondary | ICD-10-CM

## 2017-06-12 DIAGNOSIS — K224 Dyskinesia of esophagus: Secondary | ICD-10-CM

## 2017-06-12 DIAGNOSIS — D89813 Graft-versus-host disease, unspecified: Secondary | ICD-10-CM

## 2017-06-12 DIAGNOSIS — Z9484 Stem cells transplant status: Secondary | ICD-10-CM

## 2017-06-12 DIAGNOSIS — Z79899 Other long term (current) drug therapy: Secondary | ICD-10-CM | POA: Diagnosis not present

## 2017-06-12 DIAGNOSIS — I10 Essential (primary) hypertension: Secondary | ICD-10-CM | POA: Diagnosis not present

## 2017-06-12 DIAGNOSIS — Z6823 Body mass index (BMI) 23.0-23.9, adult: Secondary | ICD-10-CM | POA: Diagnosis not present

## 2017-06-12 DIAGNOSIS — K219 Gastro-esophageal reflux disease without esophagitis: Secondary | ICD-10-CM | POA: Diagnosis not present

## 2017-06-12 DIAGNOSIS — K649 Unspecified hemorrhoids: Secondary | ICD-10-CM | POA: Diagnosis not present

## 2017-06-12 MED ORDER — PREDNISONE 20 MG TABLET
ORAL_TABLET | 0 refills | 0 days
Start: 2017-06-12 — End: 2017-06-16

## 2017-06-13 ENCOUNTER — Telehealth: Payer: Self-pay | Admitting: Hematology

## 2017-06-13 NOTE — Telephone Encounter (Signed)
Scheduled appt per 7/17 sch message from [facility] - patient is aware of appt time and date.

## 2017-06-14 ENCOUNTER — Ambulatory Visit
Admission: RE | Admit: 2017-06-14 | Discharge: 2017-06-14 | Disposition: A | Discharge status: Home / Self Care | Payer: MEDICARE | Attending: Primary Care | Admitting: Primary Care

## 2017-06-14 ENCOUNTER — Ambulatory Visit: Admission: RE | Admit: 2017-06-14 | Discharge: 2017-06-14 | Disposition: A | Discharge status: Home / Self Care

## 2017-06-14 DIAGNOSIS — Z9484 Stem cells transplant status: Principal | ICD-10-CM

## 2017-06-14 DIAGNOSIS — D46Z Other myelodysplastic syndromes: Secondary | ICD-10-CM

## 2017-06-14 DIAGNOSIS — E876 Hypokalemia: Secondary | ICD-10-CM

## 2017-06-14 DIAGNOSIS — D469 Myelodysplastic syndrome, unspecified: Secondary | ICD-10-CM

## 2017-06-14 DIAGNOSIS — D649 Anemia, unspecified: Secondary | ICD-10-CM | POA: Diagnosis not present

## 2017-06-14 DIAGNOSIS — Z79899 Other long term (current) drug therapy: Secondary | ICD-10-CM | POA: Diagnosis not present

## 2017-06-14 DIAGNOSIS — D696 Thrombocytopenia, unspecified: Secondary | ICD-10-CM | POA: Diagnosis not present

## 2017-06-16 ENCOUNTER — Ambulatory Visit
Admission: RE | Admit: 2017-06-16 | Discharge: 2017-06-16 | Disposition: A | Discharge status: Home / Self Care | Payer: MEDICARE | Attending: Pharmacist Clinician (PhC)/ Clinical Pharmacy Specialist | Admitting: Pharmacist Clinician (PhC)/ Clinical Pharmacy Specialist

## 2017-06-16 ENCOUNTER — Ambulatory Visit
Admission: RE | Admit: 2017-06-16 | Discharge: 2017-06-16 | Disposition: A | Discharge status: Home / Self Care | Payer: MEDICARE

## 2017-06-16 ENCOUNTER — Ambulatory Visit
Admission: RE | Admit: 2017-06-16 | Discharge: 2017-06-16 | Disposition: A | Discharge status: Home / Self Care | Payer: MEDICARE | Attending: Primary Care | Admitting: Primary Care

## 2017-06-16 DIAGNOSIS — D46Z Other myelodysplastic syndromes: Principal | ICD-10-CM

## 2017-06-16 DIAGNOSIS — R739 Hyperglycemia, unspecified: Secondary | ICD-10-CM

## 2017-06-16 DIAGNOSIS — T380X5A Adverse effect of glucocorticoids and synthetic analogues, initial encounter: Secondary | ICD-10-CM

## 2017-06-16 DIAGNOSIS — D89813 Graft-versus-host disease, unspecified: Secondary | ICD-10-CM

## 2017-06-16 DIAGNOSIS — R0902 Hypoxemia: Secondary | ICD-10-CM

## 2017-06-16 DIAGNOSIS — C9200 Acute myeloblastic leukemia, not having achieved remission: Secondary | ICD-10-CM

## 2017-06-16 DIAGNOSIS — E8770 Fluid overload, unspecified: Secondary | ICD-10-CM

## 2017-06-16 DIAGNOSIS — D469 Myelodysplastic syndrome, unspecified: Secondary | ICD-10-CM

## 2017-06-16 DIAGNOSIS — Z9484 Stem cells transplant status: Secondary | ICD-10-CM

## 2017-06-16 DIAGNOSIS — R0602 Shortness of breath: Principal | ICD-10-CM

## 2017-06-16 DIAGNOSIS — G720 Drug-induced myopathy: Secondary | ICD-10-CM

## 2017-06-16 DIAGNOSIS — R600 Localized edema: Secondary | ICD-10-CM

## 2017-06-16 DIAGNOSIS — Z6822 Body mass index (BMI) 22.0-22.9, adult: Secondary | ICD-10-CM | POA: Diagnosis not present

## 2017-06-16 DIAGNOSIS — R911 Solitary pulmonary nodule: Secondary | ICD-10-CM | POA: Diagnosis not present

## 2017-06-16 DIAGNOSIS — Z79899 Other long term (current) drug therapy: Secondary | ICD-10-CM | POA: Diagnosis not present

## 2017-06-16 DIAGNOSIS — I313 Pericardial effusion (noninflammatory): Secondary | ICD-10-CM | POA: Diagnosis not present

## 2017-06-16 MED ORDER — INSULIN ASPART (U-100) 100 UNIT/ML (3 ML) SUBCUTANEOUS PEN
12 refills | 0 days
Start: 2017-06-16 — End: 2017-07-09

## 2017-06-16 MED ORDER — FUROSEMIDE 20 MG TABLET
ORAL_TABLET | Freq: Every day | ORAL | 11 refills | 0 days
Start: 2017-06-16 — End: 2017-07-09

## 2017-06-18 MED ORDER — PREDNISONE 20 MG TABLET
ORAL_TABLET | 0 refills | 0 days
Start: 2017-06-18 — End: 2017-07-09

## 2017-06-20 ENCOUNTER — Ambulatory Visit
Admission: RE | Admit: 2017-06-20 | Discharge: 2017-06-20 | Disposition: A | Discharge status: Home / Self Care | Payer: MEDICARE

## 2017-06-20 ENCOUNTER — Ambulatory Visit
Admission: RE | Admit: 2017-06-20 | Discharge: 2017-06-20 | Disposition: A | Discharge status: Home / Self Care | Payer: MEDICARE | Attending: Hematology & Oncology | Admitting: Hematology & Oncology

## 2017-06-20 DIAGNOSIS — C9200 Acute myeloblastic leukemia, not having achieved remission: Secondary | ICD-10-CM

## 2017-06-20 DIAGNOSIS — Z9484 Stem cells transplant status: Secondary | ICD-10-CM

## 2017-06-20 DIAGNOSIS — D899 Disorder involving the immune mechanism, unspecified: Secondary | ICD-10-CM

## 2017-06-20 DIAGNOSIS — D89810 Acute graft-versus-host disease: Secondary | ICD-10-CM

## 2017-06-20 DIAGNOSIS — D46Z Other myelodysplastic syndromes: Secondary | ICD-10-CM

## 2017-06-20 DIAGNOSIS — D469 Myelodysplastic syndrome, unspecified: Secondary | ICD-10-CM

## 2017-06-20 DIAGNOSIS — I10 Essential (primary) hypertension: Principal | ICD-10-CM

## 2017-06-20 DIAGNOSIS — T380X5A Adverse effect of glucocorticoids and synthetic analogues, initial encounter: Secondary | ICD-10-CM

## 2017-06-20 DIAGNOSIS — A0472 Enterocolitis due to Clostridium difficile, not specified as recurrent: Secondary | ICD-10-CM

## 2017-06-20 DIAGNOSIS — R739 Hyperglycemia, unspecified: Secondary | ICD-10-CM

## 2017-06-20 DIAGNOSIS — Z7682 Awaiting organ transplant status: Secondary | ICD-10-CM

## 2017-06-20 DIAGNOSIS — M8580 Other specified disorders of bone density and structure, unspecified site: Secondary | ICD-10-CM

## 2017-06-20 DIAGNOSIS — Z48290 Encounter for aftercare following bone marrow transplant: Secondary | ICD-10-CM | POA: Diagnosis not present

## 2017-06-20 DIAGNOSIS — Z79899 Other long term (current) drug therapy: Secondary | ICD-10-CM | POA: Diagnosis not present

## 2017-06-20 LAB — CBC W/ AUTO DIFF
BASOPHILS ABSOLUTE COUNT: 0 10*9/L (ref 0.0–0.1)
EOSINOPHILS ABSOLUTE COUNT: 0 10*9/L (ref 0.0–0.4)
HEMATOCRIT: 32.4 % — ABNORMAL LOW (ref 41.0–53.0)
HEMOGLOBIN: 9.8 g/dL — ABNORMAL LOW (ref 13.5–17.5)
LARGE UNSTAINED CELLS: 1 % (ref 0–4)
LYMPHOCYTES ABSOLUTE COUNT: 0.4 10*9/L — ABNORMAL LOW (ref 1.5–5.0)
MEAN CORPUSCULAR HEMOGLOBIN CONC: 30.2 g/dL — ABNORMAL LOW (ref 31.0–37.0)
MEAN CORPUSCULAR HEMOGLOBIN: 38.1 pg — ABNORMAL HIGH (ref 26.0–34.0)
MEAN CORPUSCULAR VOLUME: 125.9 fL — ABNORMAL HIGH (ref 80.0–100.0)
MEAN PLATELET VOLUME: 9.1 fL (ref 7.0–10.0)
MONOCYTES ABSOLUTE COUNT: 0.6 10*9/L (ref 0.2–0.8)
PLATELET COUNT: 126 10*9/L — ABNORMAL LOW (ref 150–440)
RED BLOOD CELL COUNT: 2.57 10*12/L — ABNORMAL LOW (ref 4.50–5.90)
RED CELL DISTRIBUTION WIDTH: 19.4 % — ABNORMAL HIGH (ref 12.0–15.0)
WBC ADJUSTED: 15.1 10*9/L — ABNORMAL HIGH (ref 4.5–11.0)

## 2017-06-20 LAB — COMPREHENSIVE METABOLIC PANEL
ALBUMIN: 2.8 g/dL — ABNORMAL LOW (ref 3.5–5.0)
ALKALINE PHOSPHATASE: 96 U/L (ref 38–126)
ALT (SGPT): 58 U/L (ref 19–72)
AST (SGOT): 31 U/L (ref 19–55)
BILIRUBIN TOTAL: 1.1 mg/dL (ref 0.0–1.2)
BLOOD UREA NITROGEN: 32 mg/dL — ABNORMAL HIGH (ref 7–21)
BUN / CREAT RATIO: 25
CALCIUM: 7.8 mg/dL — ABNORMAL LOW (ref 8.5–10.2)
CHLORIDE: 104 mmol/L (ref 98–107)
CO2: 28 mmol/L (ref 22.0–30.0)
CREATININE: 1.27 mg/dL (ref 0.70–1.30)
EGFR MDRD AF AMER: >=60 mL/min/{1.73_m2} (ref >=60)
GLUCOSE RANDOM: 134 mg/dL (ref 65–179)
POTASSIUM: 4 mmol/L (ref 3.5–5.0)
PROTEIN TOTAL: 5 g/dL — ABNORMAL LOW (ref 6.5–8.3)
SODIUM: 137 mmol/L (ref 135–145)

## 2017-06-20 LAB — TACROLIMUS, TROUGH: Lab: 4.4

## 2017-06-20 LAB — MACROCYTES

## 2017-06-20 LAB — MAGNESIUM: Magnesium:MCnc:Pt:Ser/Plas:Qn:: 1.6

## 2017-06-20 LAB — HYPERSEGMENTED NEUTROPHILS

## 2017-06-20 LAB — EGFR MDRD NON AF AMER
Glomerular filtration rate/1.73 sq M.predicted.non black:ArVRat:Pt:Ser/Plas/Bld:Qn:Creatinine-based formula (MDRD): 55 — ABNORMAL LOW

## 2017-06-20 MED ORDER — DESONIDE 0.05 % TOPICAL CREAM
Freq: Two times a day (BID) | TOPICAL | 3 refills | 0.00000 days | Status: CP
Start: 2017-06-20 — End: 2017-06-22

## 2017-06-20 MED ORDER — CEFDINIR 300 MG CAPSULE: 300 mg | capsule | Freq: Two times a day (BID) | 3 refills | 0 days

## 2017-06-20 MED ORDER — CEFDINIR 300 MG CAPSULE
ORAL_CAPSULE | Freq: Every day | ORAL | 3 refills | 0.00000 days | Status: CP
Start: 2017-06-20 — End: 2017-07-09

## 2017-06-20 NOTE — Unmapped (Addendum)
-  You can continue taking lasix 40mg  once daily  -Decrease your prednisone dose to 25mg  once daily starting on Saturday, 7/28. After that, decrease by 5mg  every 7 days  -Please start taking cefdinir once daily to help prevent bacterial infections  -Please start using [place] cream twice daily on your face  -Please call our office if your diarrhea is worsening    -We will see you back in clinic in one week    Results for orders placed or performed in visit on 06/20/17   Comprehensive Metabolic Panel   Result Value Ref Range    Sodium 137 135 - 145 mmol/L    Potassium 4.0 3.5 - 5.0 mmol/L    Chloride 104 98 - 107 mmol/L    CO2 28.0 22.0 - 30.0 mmol/L    BUN 32 (H) 7 - 21 mg/dL    Creatinine 2.84 1.32 - 1.30 mg/dL    BUN/Creatinine Ratio 25     EGFR MDRD Non Af Amer 55 (L) >=60 mL/min/1.11m2    EGFR MDRD Af Amer >=60 >=60 mL/min/1.65m2    Anion Gap 5 (L) 9 - 15 mmol/L    Glucose 134 65 - 179 mg/dL    Calcium 7.8 (L) 8.5 - 10.2 mg/dL    Albumin 2.8 (L) 3.5 - 5.0 g/dL    Total Protein 5.0 (L) 6.5 - 8.3 g/dL    Total Bilirubin 1.1 0.0 - 1.2 mg/dL    AST 31 19 - 55 U/L    ALT 58 19 - 72 U/L    Alkaline Phosphatase 96 38 - 126 U/L   Magnesium Level   Result Value Ref Range    Magnesium 1.6 1.6 - 2.2 mg/dL   CBC w/ Differential   Result Value Ref Range    WBC 15.1 (H) 4.5 - 11.0 10*9/L    RBC 2.57 (L) 4.50 - 5.90 10*12/L    HGB 9.8 (L) 13.5 - 17.5 g/dL    HCT 44.0 (L) 10.2 - 53.0 %    MCV 125.9 (H) 80.0 - 100.0 fL    MCH 38.1 (H) 26.0 - 34.0 pg    MCHC 30.2 (L) 31.0 - 37.0 g/dL    RDW 72.5 (H) 36.6 - 15.0 %    MPV 9.1 7.0 - 10.0 fL    Platelet 126 (L) 150 - 440 10*9/L    Neutrophil Left Shift 1+ (A) Not Present    Absolute Neutrophils 13.9 (H) 2.0 - 7.5 10*9/L    Absolute Lymphocytes 0.4 (L) 1.5 - 5.0 10*9/L    Absolute Monocytes 0.6 0.2 - 0.8 10*9/L    Absolute Eosinophils 0.0 0.0 - 0.4 10*9/L    Absolute Basophils 0.0 0.0 - 0.1 10*9/L    Large Unstained Cells 1 0 - 4 %    Macrocytosis Marked (A) Not Present    Anisocytosis Moderate (A) Not Present    Hypochromasia Marked (A) Not Present

## 2017-06-20 NOTE — Unmapped (Signed)
Disenrolled from [facility] - patient getting medicine from [employer]

## 2017-06-22 ENCOUNTER — Telehealth: Payer: Self-pay

## 2017-06-22 MED ORDER — HYDROCORTISONE 1 % LOTION: mL | Freq: Two times a day (BID) | 1 refills | 0 days

## 2017-06-22 MED ORDER — HYDROCORTISONE 1 % LOTION
Freq: Two times a day (BID) | TOPICAL | 2 refills | 0.00000 days
Start: 2017-06-22 — End: 2017-06-22

## 2017-06-22 MED ORDER — FLUTICASONE PROPIONATE 0.05 % TOPICAL CREAM
Freq: Two times a day (BID) | TOPICAL | 2 refills | 0 days | Status: CP
Start: 2017-06-22 — End: 2017-07-09

## 2017-06-22 NOTE — Telephone Encounter (Signed)
Pt wanted to have appt later in the day on 8/8. Dr. Irene Limbo unavailable. Appt changed to 1400 LAB and 1420 MD. Pt to call back if there is a need for change.

## 2017-06-23 ENCOUNTER — Ambulatory Visit: Admission: RE | Admit: 2017-06-23 | Discharge: 2017-06-23 | Payer: MEDICARE

## 2017-06-23 DIAGNOSIS — R1310 Dysphagia, unspecified: Principal | ICD-10-CM

## 2017-06-23 DIAGNOSIS — K224 Dyskinesia of esophagus: Secondary | ICD-10-CM

## 2017-06-23 DIAGNOSIS — A0472 Enterocolitis due to Clostridium difficile, not specified as recurrent: Secondary | ICD-10-CM

## 2017-06-23 DIAGNOSIS — Z6822 Body mass index (BMI) 22.0-22.9, adult: Secondary | ICD-10-CM | POA: Diagnosis not present

## 2017-06-23 MED ORDER — COLESTIPOL 1 GRAM TABLET
ORAL_TABLET | Freq: Two times a day (BID) | ORAL | 3 refills | 0 days | Status: CP | PRN
Start: 2017-06-23 — End: 2017-07-09

## 2017-06-24 ENCOUNTER — Inpatient Hospital Stay
Admission: RE | Admit: 2017-06-24 | Discharge: 2017-07-09 | Disposition: A | Discharge status: Home Health | Payer: MEDICARE | Attending: Nurse Practitioner | Admitting: Nurse Practitioner

## 2017-06-24 ENCOUNTER — Inpatient Hospital Stay
Admission: RE | Admit: 2017-06-24 | Discharge: 2017-07-09 | Disposition: A | Discharge status: Home Health | Payer: MEDICARE

## 2017-06-24 ENCOUNTER — Inpatient Hospital Stay
Admission: RE | Admit: 2017-06-24 | Discharge: 2017-07-09 | Disposition: A | Discharge status: Home Health | Payer: MEDICARE | Attending: Internal Medicine

## 2017-06-24 DIAGNOSIS — K648 Other hemorrhoids: Secondary | ICD-10-CM | POA: Diagnosis present

## 2017-06-24 DIAGNOSIS — E861 Hypovolemia: Secondary | ICD-10-CM | POA: Diagnosis present

## 2017-06-24 DIAGNOSIS — R600 Localized edema: Secondary | ICD-10-CM | POA: Diagnosis not present

## 2017-06-24 DIAGNOSIS — K640 First degree hemorrhoids: Secondary | ICD-10-CM | POA: Diagnosis not present

## 2017-06-24 DIAGNOSIS — R188 Other ascites: Secondary | ICD-10-CM | POA: Diagnosis not present

## 2017-06-24 DIAGNOSIS — K6389 Other specified diseases of intestine: Secondary | ICD-10-CM | POA: Diagnosis not present

## 2017-06-24 DIAGNOSIS — J90 Pleural effusion, not elsewhere classified: Secondary | ICD-10-CM | POA: Diagnosis not present

## 2017-06-24 DIAGNOSIS — K641 Second degree hemorrhoids: Secondary | ICD-10-CM | POA: Diagnosis not present

## 2017-06-24 DIAGNOSIS — K560 Paralytic ileus: Secondary | ICD-10-CM | POA: Diagnosis not present

## 2017-06-24 DIAGNOSIS — T865 Complications of stem cell transplant: Secondary | ICD-10-CM | POA: Diagnosis present

## 2017-06-24 DIAGNOSIS — R932 Abnormal findings on diagnostic imaging of liver and biliary tract: Secondary | ICD-10-CM | POA: Diagnosis not present

## 2017-06-24 DIAGNOSIS — D61818 Other pancytopenia: Secondary | ICD-10-CM | POA: Diagnosis not present

## 2017-06-24 DIAGNOSIS — I10 Essential (primary) hypertension: Secondary | ICD-10-CM | POA: Diagnosis present

## 2017-06-24 DIAGNOSIS — Z808 Family history of malignant neoplasm of other organs or systems: Secondary | ICD-10-CM | POA: Diagnosis not present

## 2017-06-24 DIAGNOSIS — C9201 Acute myeloblastic leukemia, in remission: Secondary | ICD-10-CM | POA: Diagnosis not present

## 2017-06-24 DIAGNOSIS — A0471 Enterocolitis due to Clostridium difficile, recurrent: Secondary | ICD-10-CM | POA: Diagnosis not present

## 2017-06-24 DIAGNOSIS — Z8042 Family history of malignant neoplasm of prostate: Secondary | ICD-10-CM | POA: Diagnosis not present

## 2017-06-24 DIAGNOSIS — D89813 Graft-versus-host disease, unspecified: Secondary | ICD-10-CM | POA: Diagnosis not present

## 2017-06-24 DIAGNOSIS — N179 Acute kidney failure, unspecified: Secondary | ICD-10-CM | POA: Diagnosis not present

## 2017-06-24 DIAGNOSIS — R109 Unspecified abdominal pain: Secondary | ICD-10-CM | POA: Diagnosis not present

## 2017-06-24 DIAGNOSIS — R1031 Right lower quadrant pain: Secondary | ICD-10-CM | POA: Diagnosis not present

## 2017-06-24 DIAGNOSIS — Z9481 Bone marrow transplant status: Secondary | ICD-10-CM | POA: Diagnosis not present

## 2017-06-24 DIAGNOSIS — R112 Nausea with vomiting, unspecified: Secondary | ICD-10-CM | POA: Diagnosis not present

## 2017-06-24 DIAGNOSIS — E874 Mixed disorder of acid-base balance: Secondary | ICD-10-CM | POA: Diagnosis not present

## 2017-06-24 DIAGNOSIS — Z803 Family history of malignant neoplasm of breast: Secondary | ICD-10-CM | POA: Diagnosis not present

## 2017-06-24 DIAGNOSIS — R739 Hyperglycemia, unspecified: Secondary | ICD-10-CM | POA: Diagnosis not present

## 2017-06-24 DIAGNOSIS — T380X5A Adverse effect of glucocorticoids and synthetic analogues, initial encounter: Secondary | ICD-10-CM | POA: Diagnosis not present

## 2017-06-24 DIAGNOSIS — I313 Pericardial effusion (noninflammatory): Secondary | ICD-10-CM | POA: Diagnosis not present

## 2017-06-24 DIAGNOSIS — K219 Gastro-esophageal reflux disease without esophagitis: Secondary | ICD-10-CM | POA: Diagnosis not present

## 2017-06-24 DIAGNOSIS — Z9484 Stem cells transplant status: Secondary | ICD-10-CM | POA: Diagnosis not present

## 2017-06-24 DIAGNOSIS — D469 Myelodysplastic syndrome, unspecified: Secondary | ICD-10-CM | POA: Diagnosis not present

## 2017-06-24 DIAGNOSIS — D696 Thrombocytopenia, unspecified: Secondary | ICD-10-CM | POA: Diagnosis not present

## 2017-06-24 DIAGNOSIS — J9811 Atelectasis: Secondary | ICD-10-CM | POA: Diagnosis not present

## 2017-06-24 DIAGNOSIS — R0902 Hypoxemia: Secondary | ICD-10-CM | POA: Diagnosis not present

## 2017-06-24 DIAGNOSIS — G4733 Obstructive sleep apnea (adult) (pediatric): Secondary | ICD-10-CM | POA: Diagnosis present

## 2017-06-24 DIAGNOSIS — R197 Diarrhea, unspecified: Secondary | ICD-10-CM | POA: Diagnosis not present

## 2017-06-24 DIAGNOSIS — C9200 Acute myeloblastic leukemia, not having achieved remission: Secondary | ICD-10-CM | POA: Diagnosis not present

## 2017-06-24 DIAGNOSIS — Z801 Family history of malignant neoplasm of trachea, bronchus and lung: Secondary | ICD-10-CM | POA: Diagnosis not present

## 2017-06-24 DIAGNOSIS — R1310 Dysphagia, unspecified: Secondary | ICD-10-CM | POA: Diagnosis present

## 2017-06-24 DIAGNOSIS — D89810 Acute graft-versus-host disease: Secondary | ICD-10-CM | POA: Diagnosis not present

## 2017-06-24 DIAGNOSIS — R7989 Other specified abnormal findings of blood chemistry: Secondary | ICD-10-CM | POA: Diagnosis not present

## 2017-06-24 DIAGNOSIS — D72829 Elevated white blood cell count, unspecified: Secondary | ICD-10-CM | POA: Diagnosis not present

## 2017-06-24 DIAGNOSIS — Z9079 Acquired absence of other genital organ(s): Secondary | ICD-10-CM | POA: Diagnosis not present

## 2017-06-24 DIAGNOSIS — E8809 Other disorders of plasma-protein metabolism, not elsewhere classified: Secondary | ICD-10-CM | POA: Diagnosis present

## 2017-06-24 DIAGNOSIS — Z8546 Personal history of malignant neoplasm of prostate: Secondary | ICD-10-CM | POA: Diagnosis not present

## 2017-06-24 DIAGNOSIS — R0602 Shortness of breath: Secondary | ICD-10-CM | POA: Diagnosis not present

## 2017-06-24 DIAGNOSIS — A0472 Enterocolitis due to Clostridium difficile, not specified as recurrent: Secondary | ICD-10-CM | POA: Diagnosis not present

## 2017-06-24 DIAGNOSIS — E440 Moderate protein-calorie malnutrition: Secondary | ICD-10-CM | POA: Diagnosis present

## 2017-06-24 DIAGNOSIS — R140 Abdominal distension (gaseous): Secondary | ICD-10-CM | POA: Diagnosis not present

## 2017-06-24 DIAGNOSIS — R601 Generalized edema: Secondary | ICD-10-CM | POA: Diagnosis not present

## 2017-06-25 MED ORDER — FIDAXOMICIN 200 MG TABLET: 200 mg | tablet | 0 refills | 0 days

## 2017-06-25 MED ORDER — FIDAXOMICIN 200 MG TABLET
ORAL_TABLET | Freq: Two times a day (BID) | ORAL | 0 refills | 0.00000 days | Status: CP
Start: 2017-06-25 — End: 2017-07-09

## 2017-06-27 DIAGNOSIS — R601 Generalized edema: Principal | ICD-10-CM

## 2017-06-28 DIAGNOSIS — R601 Generalized edema: Principal | ICD-10-CM

## 2017-06-29 DIAGNOSIS — R601 Generalized edema: Principal | ICD-10-CM

## 2017-07-02 DIAGNOSIS — R601 Generalized edema: Principal | ICD-10-CM

## 2017-07-04 DIAGNOSIS — R601 Generalized edema: Principal | ICD-10-CM

## 2017-07-05 ENCOUNTER — Other Ambulatory Visit: Payer: Medicare Other

## 2017-07-05 ENCOUNTER — Ambulatory Visit: Payer: Medicare Other | Admitting: Hematology

## 2017-07-06 ENCOUNTER — Telehealth: Payer: Self-pay | Admitting: Hematology

## 2017-07-06 ENCOUNTER — Other Ambulatory Visit: Payer: Self-pay | Admitting: Hematology

## 2017-07-06 ENCOUNTER — Other Ambulatory Visit: Payer: Medicare Other

## 2017-07-06 ENCOUNTER — Ambulatory Visit: Payer: Medicare Other | Admitting: Hematology

## 2017-07-06 DIAGNOSIS — D4622 Refractory anemia with excess of blasts 2: Secondary | ICD-10-CM

## 2017-07-06 DIAGNOSIS — C9201 Acute myeloblastic leukemia, in remission: Secondary | ICD-10-CM

## 2017-07-06 NOTE — Telephone Encounter (Signed)
lvm to inform pt of 8/20 appt at 130 pm per sch msg

## 2017-07-07 MED ORDER — FOAM BANDAGE 6" X 6"
0 refills | 0 days | Status: CP
Start: 2017-07-07 — End: 2017-07-21

## 2017-07-09 MED ORDER — TACROLIMUS 0.5 MG CAPSULE
ORAL_CAPSULE | Freq: Two times a day (BID) | ORAL | 6 refills | 0 days
Start: 2017-07-09 — End: 2017-07-21

## 2017-07-09 MED ORDER — CALCIUM CARBONATE 500 MG/5 ML CALCIUM (1,250 MG/5 ML) ORAL SUSPENSION
Freq: Three times a day (TID) | ORAL | 1 refills | 0.00000 days | Status: CP
Start: 2017-07-09 — End: 2017-07-17

## 2017-07-09 MED ORDER — DESONIDE 0.05 % TOPICAL OINTMENT
Freq: Two times a day (BID) | TOPICAL | 0 refills | 0 days | PRN
Start: 2017-07-09 — End: 2017-07-19

## 2017-07-09 MED ORDER — MIRTAZAPINE 7.5 MG TABLET
ORAL_TABLET | Freq: Every evening | ORAL | 3 refills | 0 days | Status: CP
Start: 2017-07-09 — End: 2017-07-12

## 2017-07-09 MED ORDER — VANCOMYCIN 125 MG CAPSULE
ORAL_CAPSULE | 0 refills | 0 days | Status: CP
Start: 2017-07-09 — End: 2017-08-17

## 2017-07-09 MED ORDER — DILTIAZEM 60 MG TABLET
ORAL_TABLET | Freq: Four times a day (QID) | ORAL | 3 refills | 0 days | Status: CP
Start: 2017-07-09 — End: 2017-11-15

## 2017-07-10 ENCOUNTER — Ambulatory Visit
Admission: RE | Admit: 2017-07-10 | Discharge: 2017-07-10 | Disposition: A | Discharge status: Home / Self Care | Payer: MEDICARE

## 2017-07-10 ENCOUNTER — Ambulatory Visit
Admission: RE | Admit: 2017-07-10 | Discharge: 2017-07-10 | Disposition: A | Discharge status: Home / Self Care | Payer: MEDICARE | Attending: Oncology | Admitting: Oncology

## 2017-07-10 DIAGNOSIS — A0472 Enterocolitis due to Clostridium difficile, not specified as recurrent: Principal | ICD-10-CM

## 2017-07-10 DIAGNOSIS — D46Z Other myelodysplastic syndromes: Principal | ICD-10-CM

## 2017-07-10 DIAGNOSIS — Z9484 Stem cells transplant status: Secondary | ICD-10-CM

## 2017-07-10 DIAGNOSIS — R739 Hyperglycemia, unspecified: Secondary | ICD-10-CM

## 2017-07-10 DIAGNOSIS — C9200 Acute myeloblastic leukemia, not having achieved remission: Principal | ICD-10-CM

## 2017-07-10 DIAGNOSIS — D89810 Acute graft-versus-host disease: Secondary | ICD-10-CM

## 2017-07-10 DIAGNOSIS — T380X5A Adverse effect of glucocorticoids and synthetic analogues, initial encounter: Secondary | ICD-10-CM

## 2017-07-10 DIAGNOSIS — D469 Myelodysplastic syndrome, unspecified: Secondary | ICD-10-CM

## 2017-07-10 DIAGNOSIS — J9811 Atelectasis: Secondary | ICD-10-CM | POA: Diagnosis not present

## 2017-07-10 DIAGNOSIS — K224 Dyskinesia of esophagus: Secondary | ICD-10-CM | POA: Diagnosis not present

## 2017-07-10 DIAGNOSIS — R600 Localized edema: Secondary | ICD-10-CM | POA: Diagnosis not present

## 2017-07-10 DIAGNOSIS — Z6824 Body mass index (BMI) 24.0-24.9, adult: Secondary | ICD-10-CM | POA: Diagnosis not present

## 2017-07-10 DIAGNOSIS — K219 Gastro-esophageal reflux disease without esophagitis: Secondary | ICD-10-CM | POA: Diagnosis not present

## 2017-07-10 DIAGNOSIS — D520 Dietary folate deficiency anemia: Secondary | ICD-10-CM | POA: Diagnosis not present

## 2017-07-10 DIAGNOSIS — Z5181 Encounter for therapeutic drug level monitoring: Secondary | ICD-10-CM | POA: Diagnosis not present

## 2017-07-10 DIAGNOSIS — T380X5D Adverse effect of glucocorticoids and synthetic analogues, subsequent encounter: Secondary | ICD-10-CM | POA: Diagnosis not present

## 2017-07-10 DIAGNOSIS — R110 Nausea: Secondary | ICD-10-CM | POA: Diagnosis not present

## 2017-07-10 DIAGNOSIS — J90 Pleural effusion, not elsewhere classified: Secondary | ICD-10-CM | POA: Diagnosis not present

## 2017-07-10 DIAGNOSIS — R5383 Other fatigue: Secondary | ICD-10-CM | POA: Diagnosis not present

## 2017-07-10 DIAGNOSIS — D696 Thrombocytopenia, unspecified: Secondary | ICD-10-CM | POA: Diagnosis not present

## 2017-07-10 DIAGNOSIS — Z48298 Encounter for aftercare following other organ transplant: Secondary | ICD-10-CM | POA: Diagnosis not present

## 2017-07-10 DIAGNOSIS — A0471 Enterocolitis due to Clostridium difficile, recurrent: Secondary | ICD-10-CM | POA: Diagnosis not present

## 2017-07-10 DIAGNOSIS — E440 Moderate protein-calorie malnutrition: Secondary | ICD-10-CM | POA: Diagnosis not present

## 2017-07-10 DIAGNOSIS — C92A1 Acute myeloid leukemia with multilineage dysplasia, in remission: Secondary | ICD-10-CM | POA: Diagnosis not present

## 2017-07-10 DIAGNOSIS — D759 Disease of blood and blood-forming organs, unspecified: Secondary | ICD-10-CM | POA: Diagnosis not present

## 2017-07-10 DIAGNOSIS — Z79899 Other long term (current) drug therapy: Secondary | ICD-10-CM | POA: Diagnosis not present

## 2017-07-10 DIAGNOSIS — K649 Unspecified hemorrhoids: Secondary | ICD-10-CM | POA: Diagnosis not present

## 2017-07-10 DIAGNOSIS — I10 Essential (primary) hypertension: Secondary | ICD-10-CM | POA: Diagnosis not present

## 2017-07-10 MED ORDER — POTASSIUM CHLORIDE 20 MEQ ORAL PACKET
PACK | Freq: Every day | ORAL | 3 refills | 0 days | Status: CP
Start: 2017-07-10 — End: 2017-09-05

## 2017-07-10 MED ORDER — PREDNISONE 10 MG TABLET
ORAL_TABLET | Freq: Every day | ORAL | 0 refills | 0.00000 days
Start: 2017-07-10 — End: 2017-07-17

## 2017-07-10 MED ORDER — BUMETANIDE 1 MG TABLET
ORAL_TABLET | Freq: Every day | ORAL | 1 refills | 0 days | Status: CP
Start: 2017-07-10 — End: 2017-08-02

## 2017-07-12 ENCOUNTER — Ambulatory Visit
Admission: RE | Admit: 2017-07-12 | Discharge: 2017-07-12 | Disposition: A | Discharge status: Home / Self Care | Payer: MEDICARE | Attending: Medical | Admitting: Medical

## 2017-07-12 ENCOUNTER — Ambulatory Visit
Admission: RE | Admit: 2017-07-12 | Discharge: 2017-07-12 | Disposition: A | Discharge status: Home / Self Care | Payer: MEDICARE

## 2017-07-12 DIAGNOSIS — D46Z Other myelodysplastic syndromes: Principal | ICD-10-CM

## 2017-07-12 DIAGNOSIS — D469 Myelodysplastic syndrome, unspecified: Secondary | ICD-10-CM

## 2017-07-12 DIAGNOSIS — C9201 Acute myeloblastic leukemia, in remission: Principal | ICD-10-CM

## 2017-07-12 DIAGNOSIS — R739 Hyperglycemia, unspecified: Secondary | ICD-10-CM | POA: Diagnosis not present

## 2017-07-12 DIAGNOSIS — Z794 Long term (current) use of insulin: Secondary | ICD-10-CM | POA: Diagnosis not present

## 2017-07-12 DIAGNOSIS — A0471 Enterocolitis due to Clostridium difficile, recurrent: Secondary | ICD-10-CM | POA: Diagnosis not present

## 2017-07-12 DIAGNOSIS — Z9221 Personal history of antineoplastic chemotherapy: Secondary | ICD-10-CM | POA: Diagnosis not present

## 2017-07-12 DIAGNOSIS — Z6823 Body mass index (BMI) 23.0-23.9, adult: Secondary | ICD-10-CM | POA: Diagnosis not present

## 2017-07-12 DIAGNOSIS — R238 Other skin changes: Secondary | ICD-10-CM | POA: Diagnosis not present

## 2017-07-12 DIAGNOSIS — Z79899 Other long term (current) drug therapy: Secondary | ICD-10-CM | POA: Diagnosis not present

## 2017-07-12 DIAGNOSIS — K644 Residual hemorrhoidal skin tags: Secondary | ICD-10-CM | POA: Diagnosis not present

## 2017-07-12 DIAGNOSIS — Z9484 Stem cells transplant status: Secondary | ICD-10-CM | POA: Diagnosis not present

## 2017-07-12 DIAGNOSIS — E440 Moderate protein-calorie malnutrition: Secondary | ICD-10-CM | POA: Diagnosis not present

## 2017-07-12 DIAGNOSIS — R197 Diarrhea, unspecified: Secondary | ICD-10-CM | POA: Diagnosis not present

## 2017-07-12 DIAGNOSIS — K219 Gastro-esophageal reflux disease without esophagitis: Secondary | ICD-10-CM | POA: Diagnosis not present

## 2017-07-12 DIAGNOSIS — R600 Localized edema: Secondary | ICD-10-CM | POA: Diagnosis not present

## 2017-07-12 DIAGNOSIS — Z87891 Personal history of nicotine dependence: Secondary | ICD-10-CM | POA: Diagnosis not present

## 2017-07-12 DIAGNOSIS — Z85820 Personal history of malignant melanoma of skin: Secondary | ICD-10-CM | POA: Diagnosis not present

## 2017-07-12 DIAGNOSIS — I10 Essential (primary) hypertension: Secondary | ICD-10-CM | POA: Diagnosis not present

## 2017-07-12 DIAGNOSIS — G4733 Obstructive sleep apnea (adult) (pediatric): Secondary | ICD-10-CM | POA: Diagnosis not present

## 2017-07-12 DIAGNOSIS — R0602 Shortness of breath: Secondary | ICD-10-CM | POA: Diagnosis not present

## 2017-07-12 DIAGNOSIS — R140 Abdominal distension (gaseous): Secondary | ICD-10-CM | POA: Diagnosis not present

## 2017-07-12 DIAGNOSIS — Z8546 Personal history of malignant neoplasm of prostate: Secondary | ICD-10-CM | POA: Diagnosis not present

## 2017-07-12 DIAGNOSIS — C9200 Acute myeloblastic leukemia, not having achieved remission: Secondary | ICD-10-CM | POA: Diagnosis not present

## 2017-07-12 MED ORDER — SILVER SULFATE-FOAM BANDAGE 6" X 6"
Freq: Every day | TOPICAL | 0 refills | 0.00000 days | Status: CP
Start: 2017-07-12 — End: 2017-07-21

## 2017-07-14 ENCOUNTER — Ambulatory Visit
Admission: RE | Admit: 2017-07-14 | Discharge: 2017-07-14 | Disposition: A | Discharge status: Home / Self Care | Payer: MEDICARE

## 2017-07-14 ENCOUNTER — Ambulatory Visit: Admission: RE | Admit: 2017-07-14 | Discharge: 2017-07-14 | Disposition: A | Discharge status: Home / Self Care

## 2017-07-14 ENCOUNTER — Ambulatory Visit
Admission: RE | Admit: 2017-07-14 | Discharge: 2017-07-14 | Disposition: A | Discharge status: Home / Self Care | Admitting: Medical

## 2017-07-14 DIAGNOSIS — D46Z Other myelodysplastic syndromes: Principal | ICD-10-CM

## 2017-07-14 DIAGNOSIS — C9201 Acute myeloblastic leukemia, in remission: Principal | ICD-10-CM

## 2017-07-14 DIAGNOSIS — D469 Myelodysplastic syndrome, unspecified: Secondary | ICD-10-CM

## 2017-07-14 DIAGNOSIS — K649 Unspecified hemorrhoids: Secondary | ICD-10-CM | POA: Diagnosis not present

## 2017-07-14 DIAGNOSIS — R0602 Shortness of breath: Secondary | ICD-10-CM | POA: Diagnosis not present

## 2017-07-14 DIAGNOSIS — Z7952 Long term (current) use of systemic steroids: Secondary | ICD-10-CM | POA: Diagnosis not present

## 2017-07-14 DIAGNOSIS — Z9221 Personal history of antineoplastic chemotherapy: Secondary | ICD-10-CM | POA: Diagnosis not present

## 2017-07-14 DIAGNOSIS — D89810 Acute graft-versus-host disease: Secondary | ICD-10-CM | POA: Diagnosis not present

## 2017-07-14 DIAGNOSIS — L539 Erythematous condition, unspecified: Secondary | ICD-10-CM | POA: Diagnosis not present

## 2017-07-14 DIAGNOSIS — E440 Moderate protein-calorie malnutrition: Secondary | ICD-10-CM | POA: Diagnosis not present

## 2017-07-14 DIAGNOSIS — T865 Complications of stem cell transplant: Secondary | ICD-10-CM | POA: Diagnosis not present

## 2017-07-14 DIAGNOSIS — Z6823 Body mass index (BMI) 23.0-23.9, adult: Secondary | ICD-10-CM | POA: Diagnosis not present

## 2017-07-14 DIAGNOSIS — K219 Gastro-esophageal reflux disease without esophagitis: Secondary | ICD-10-CM | POA: Diagnosis not present

## 2017-07-14 DIAGNOSIS — D520 Dietary folate deficiency anemia: Secondary | ICD-10-CM | POA: Diagnosis not present

## 2017-07-14 DIAGNOSIS — A0471 Enterocolitis due to Clostridium difficile, recurrent: Secondary | ICD-10-CM | POA: Diagnosis not present

## 2017-07-14 DIAGNOSIS — G4733 Obstructive sleep apnea (adult) (pediatric): Secondary | ICD-10-CM | POA: Diagnosis not present

## 2017-07-14 DIAGNOSIS — L988 Other specified disorders of the skin and subcutaneous tissue: Secondary | ICD-10-CM | POA: Diagnosis not present

## 2017-07-14 DIAGNOSIS — R739 Hyperglycemia, unspecified: Secondary | ICD-10-CM | POA: Diagnosis not present

## 2017-07-14 DIAGNOSIS — Z9225 Personal history of immunosupression therapy: Secondary | ICD-10-CM | POA: Diagnosis not present

## 2017-07-14 DIAGNOSIS — T380X5A Adverse effect of glucocorticoids and synthetic analogues, initial encounter: Secondary | ICD-10-CM | POA: Diagnosis not present

## 2017-07-14 DIAGNOSIS — Z794 Long term (current) use of insulin: Secondary | ICD-10-CM | POA: Diagnosis not present

## 2017-07-14 DIAGNOSIS — R600 Localized edema: Secondary | ICD-10-CM | POA: Diagnosis not present

## 2017-07-14 DIAGNOSIS — I10 Essential (primary) hypertension: Secondary | ICD-10-CM | POA: Diagnosis not present

## 2017-07-14 MED ORDER — SILVER SULFADIAZINE 1 % TOPICAL CREAM
1 refills | 0 days | Status: CP
Start: 2017-07-14 — End: 2017-07-17

## 2017-07-17 ENCOUNTER — Ambulatory Visit: Payer: Medicare Other | Admitting: Hematology

## 2017-07-17 ENCOUNTER — Other Ambulatory Visit: Payer: Medicare Other

## 2017-07-17 ENCOUNTER — Ambulatory Visit
Admission: RE | Admit: 2017-07-17 | Discharge: 2017-07-17 | Disposition: A | Discharge status: Home / Self Care | Payer: MEDICARE

## 2017-07-17 ENCOUNTER — Ambulatory Visit
Admission: RE | Admit: 2017-07-17 | Discharge: 2017-07-17 | Disposition: A | Discharge status: Home / Self Care | Payer: MEDICARE | Attending: Primary Care | Admitting: Primary Care

## 2017-07-17 DIAGNOSIS — D89813 Graft-versus-host disease, unspecified: Principal | ICD-10-CM

## 2017-07-17 DIAGNOSIS — A0472 Enterocolitis due to Clostridium difficile, not specified as recurrent: Secondary | ICD-10-CM

## 2017-07-17 DIAGNOSIS — C9201 Acute myeloblastic leukemia, in remission: Secondary | ICD-10-CM

## 2017-07-17 DIAGNOSIS — R739 Hyperglycemia, unspecified: Secondary | ICD-10-CM

## 2017-07-17 DIAGNOSIS — Z9484 Stem cells transplant status: Secondary | ICD-10-CM

## 2017-07-17 DIAGNOSIS — L988 Other specified disorders of the skin and subcutaneous tissue: Secondary | ICD-10-CM

## 2017-07-17 DIAGNOSIS — D469 Myelodysplastic syndrome, unspecified: Principal | ICD-10-CM

## 2017-07-17 DIAGNOSIS — D46Z Other myelodysplastic syndromes: Secondary | ICD-10-CM

## 2017-07-17 DIAGNOSIS — T380X5A Adverse effect of glucocorticoids and synthetic analogues, initial encounter: Secondary | ICD-10-CM

## 2017-07-17 DIAGNOSIS — C9200 Acute myeloblastic leukemia, not having achieved remission: Secondary | ICD-10-CM

## 2017-07-17 DIAGNOSIS — R600 Localized edema: Secondary | ICD-10-CM

## 2017-07-17 DIAGNOSIS — I10 Essential (primary) hypertension: Secondary | ICD-10-CM | POA: Diagnosis not present

## 2017-07-17 DIAGNOSIS — Z87891 Personal history of nicotine dependence: Secondary | ICD-10-CM | POA: Diagnosis not present

## 2017-07-17 DIAGNOSIS — Z9221 Personal history of antineoplastic chemotherapy: Secondary | ICD-10-CM | POA: Diagnosis not present

## 2017-07-17 DIAGNOSIS — L989 Disorder of the skin and subcutaneous tissue, unspecified: Secondary | ICD-10-CM | POA: Diagnosis not present

## 2017-07-17 DIAGNOSIS — Z8546 Personal history of malignant neoplasm of prostate: Secondary | ICD-10-CM | POA: Diagnosis not present

## 2017-07-17 DIAGNOSIS — Z794 Long term (current) use of insulin: Secondary | ICD-10-CM | POA: Diagnosis not present

## 2017-07-17 DIAGNOSIS — Z6822 Body mass index (BMI) 22.0-22.9, adult: Secondary | ICD-10-CM | POA: Diagnosis not present

## 2017-07-17 DIAGNOSIS — Z48290 Encounter for aftercare following bone marrow transplant: Secondary | ICD-10-CM | POA: Diagnosis not present

## 2017-07-17 DIAGNOSIS — A0471 Enterocolitis due to Clostridium difficile, recurrent: Secondary | ICD-10-CM | POA: Diagnosis not present

## 2017-07-17 DIAGNOSIS — D696 Thrombocytopenia, unspecified: Secondary | ICD-10-CM | POA: Diagnosis not present

## 2017-07-17 DIAGNOSIS — K649 Unspecified hemorrhoids: Secondary | ICD-10-CM | POA: Diagnosis not present

## 2017-07-17 DIAGNOSIS — D539 Nutritional anemia, unspecified: Secondary | ICD-10-CM | POA: Diagnosis not present

## 2017-07-17 DIAGNOSIS — K219 Gastro-esophageal reflux disease without esophagitis: Secondary | ICD-10-CM | POA: Diagnosis not present

## 2017-07-17 DIAGNOSIS — J90 Pleural effusion, not elsewhere classified: Secondary | ICD-10-CM | POA: Diagnosis not present

## 2017-07-17 DIAGNOSIS — Z7952 Long term (current) use of systemic steroids: Secondary | ICD-10-CM | POA: Diagnosis not present

## 2017-07-17 DIAGNOSIS — R1310 Dysphagia, unspecified: Secondary | ICD-10-CM | POA: Diagnosis not present

## 2017-07-17 DIAGNOSIS — Z79899 Other long term (current) drug therapy: Secondary | ICD-10-CM | POA: Diagnosis not present

## 2017-07-17 DIAGNOSIS — G4733 Obstructive sleep apnea (adult) (pediatric): Secondary | ICD-10-CM | POA: Diagnosis not present

## 2017-07-17 IMAGING — DX DG CHEST 2V
2 series · 2 of 2 positions shown · non-contrast
Comparison: PA and lateral chest x-ray dated October 08, 2008 and
chest CT scan of October 22, 2013.

CLINICAL DATA: Two months of shortness of breath with exertion,
history of COPD, pulmonary nodules, former smoker.

EXAM:
CHEST  2 VIEW

[chest pa]
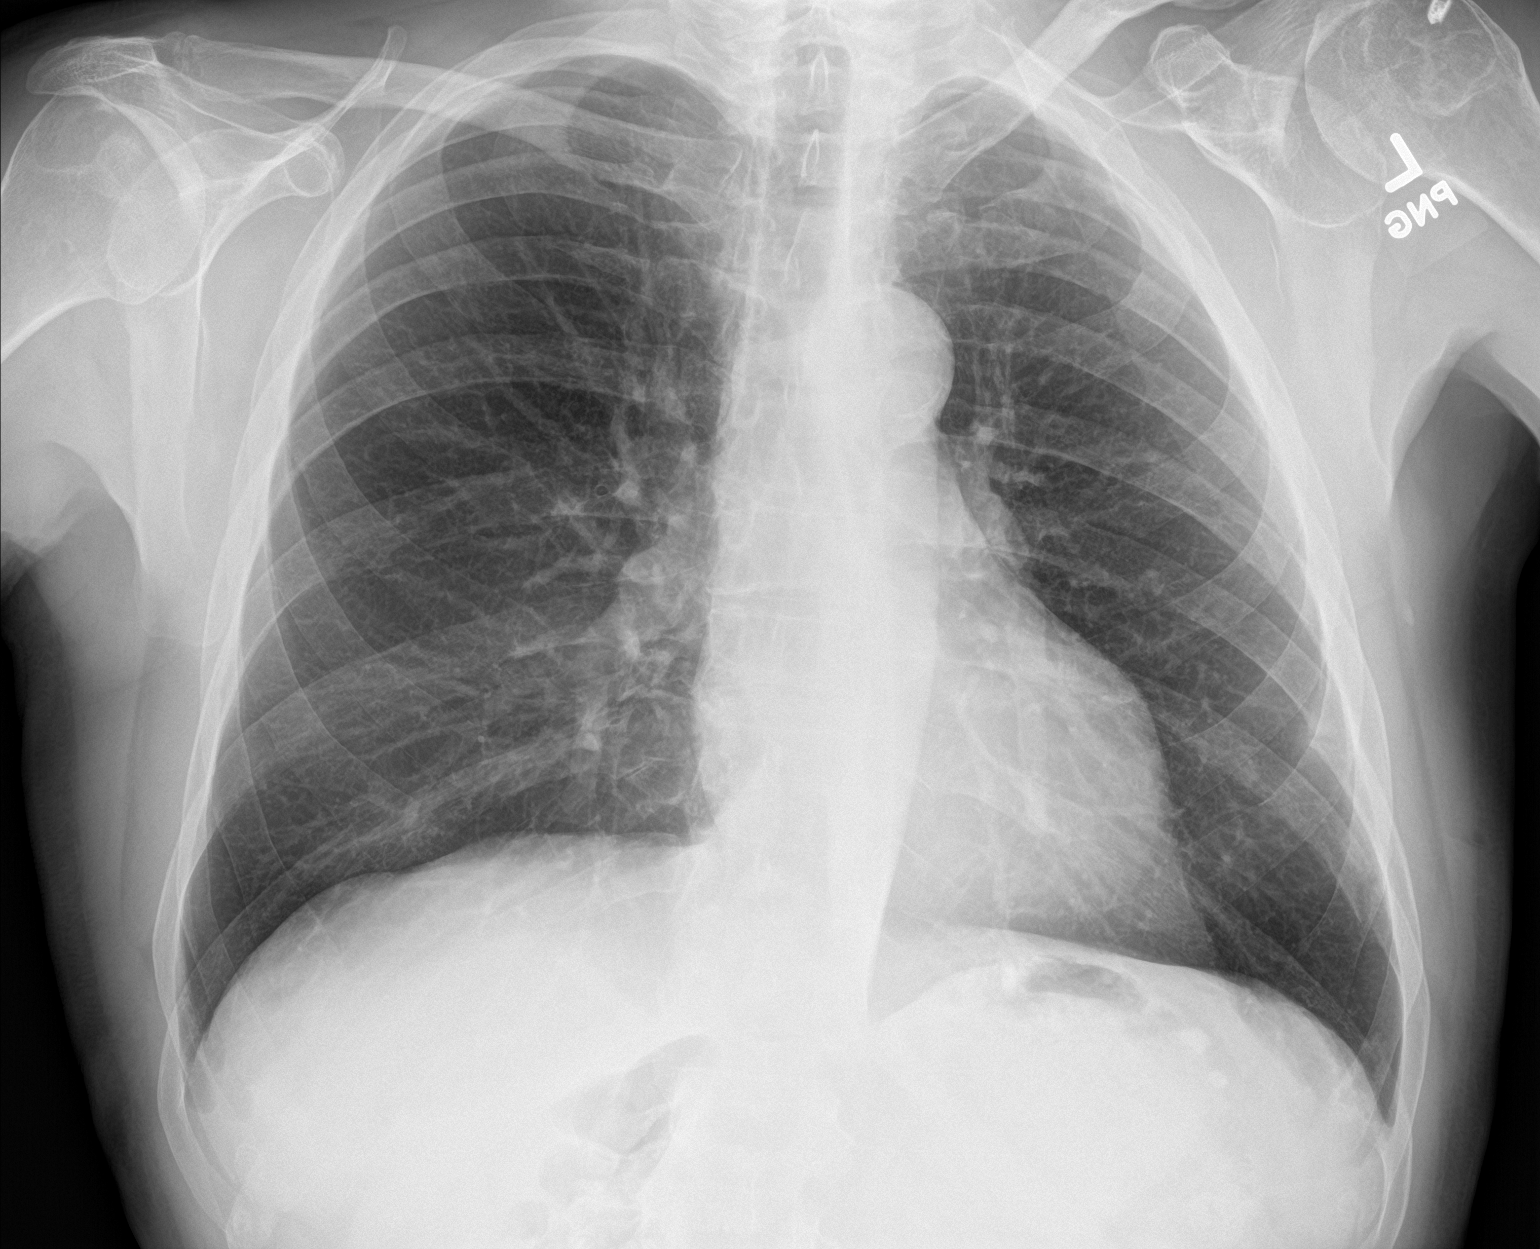

[chest lat]
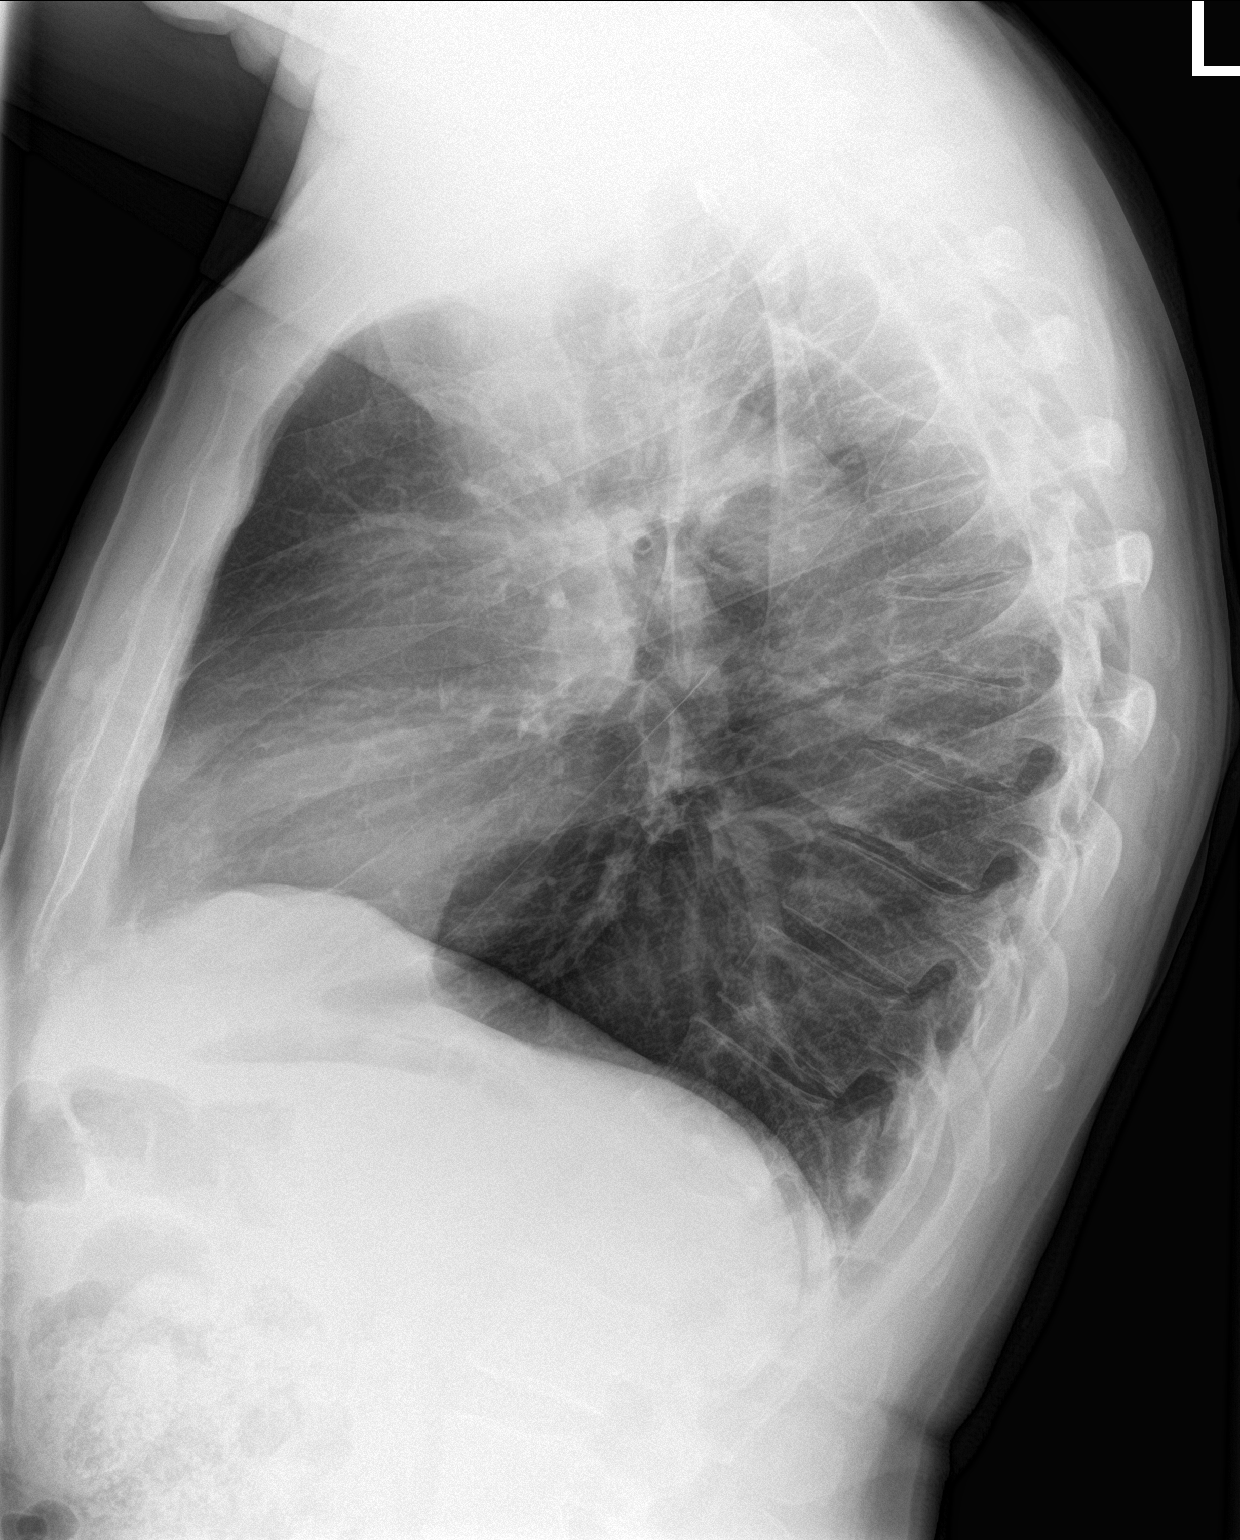

[2 of 2 positions shown; findings below may reference images not displayed]

FINDINGS: The lungs are adequately inflated. There is no focal infiltrate.
There is no pleural effusion. No pulmonary parenchymal masses are
observed. The heart and pulmonary vascularity are normal. The
mediastinum is normal in width. There is a fracture of the lateral
aspect of the left eighth and ninth ribs without definite healing.
These were not visible on the most recent chest exam which was a CT
scan February 28, 2013.
IMPRESSION: There is no active cardiopulmonary disease. There are fractures of
the lateral aspects of the left eighth and ninth ribs which appear
to be acute or subacute.

## 2017-07-17 MED ORDER — TRIAMCINOLONE ACETONIDE 0.1 % TOPICAL CREAM: g | 6 refills | 0 days

## 2017-07-17 MED ORDER — CALCIUM CARBONATE 300 MG (750 MG) CHEWABLE TABLET
ORAL_TABLET | Freq: Two times a day (BID) | ORAL | 0 refills | 0.00000 days
Start: 2017-07-17 — End: 2017-08-16

## 2017-07-17 MED ORDER — TRIAMCINOLONE ACETONIDE 0.1 % TOPICAL CREAM
Freq: Two times a day (BID) | TOPICAL | 6 refills | 0.00000 days | Status: CP
Start: 2017-07-17 — End: 2017-07-17

## 2017-07-17 MED ORDER — PREDNISONE 10 MG TABLET
ORAL_TABLET | 0 refills | 0 days
Start: 2017-07-17 — End: 2017-07-21

## 2017-07-18 MED ORDER — TRIAMCINOLONE ACETONIDE 0.1 % TOPICAL CREAM
Freq: Two times a day (BID) | TOPICAL | 6 refills | 0 days | Status: CP
Start: 2017-07-18 — End: 2017-08-17

## 2017-07-19 ENCOUNTER — Telehealth: Payer: Self-pay | Admitting: Hematology

## 2017-07-19 ENCOUNTER — Ambulatory Visit
Admission: RE | Admit: 2017-07-19 | Discharge: 2017-07-19 | Disposition: A | Discharge status: Home / Self Care | Payer: MEDICARE | Attending: Primary Care | Admitting: Primary Care

## 2017-07-19 ENCOUNTER — Ambulatory Visit
Admission: RE | Admit: 2017-07-19 | Discharge: 2017-07-19 | Disposition: A | Discharge status: Home / Self Care | Payer: MEDICARE

## 2017-07-19 DIAGNOSIS — A0472 Enterocolitis due to Clostridium difficile, not specified as recurrent: Secondary | ICD-10-CM

## 2017-07-19 DIAGNOSIS — L988 Other specified disorders of the skin and subcutaneous tissue: Secondary | ICD-10-CM

## 2017-07-19 DIAGNOSIS — R600 Localized edema: Secondary | ICD-10-CM

## 2017-07-19 DIAGNOSIS — D89813 Graft-versus-host disease, unspecified: Secondary | ICD-10-CM

## 2017-07-19 DIAGNOSIS — R739 Hyperglycemia, unspecified: Secondary | ICD-10-CM

## 2017-07-19 DIAGNOSIS — C9201 Acute myeloblastic leukemia, in remission: Secondary | ICD-10-CM

## 2017-07-19 DIAGNOSIS — D46Z Other myelodysplastic syndromes: Principal | ICD-10-CM

## 2017-07-19 DIAGNOSIS — Z9484 Stem cells transplant status: Principal | ICD-10-CM

## 2017-07-19 DIAGNOSIS — T380X5A Adverse effect of glucocorticoids and synthetic analogues, initial encounter: Secondary | ICD-10-CM

## 2017-07-19 DIAGNOSIS — Z7983 Long term (current) use of bisphosphonates: Secondary | ICD-10-CM | POA: Diagnosis not present

## 2017-07-19 DIAGNOSIS — Z86018 Personal history of other benign neoplasm: Secondary | ICD-10-CM | POA: Diagnosis not present

## 2017-07-19 DIAGNOSIS — Z87891 Personal history of nicotine dependence: Secondary | ICD-10-CM | POA: Diagnosis not present

## 2017-07-19 DIAGNOSIS — Z79899 Other long term (current) drug therapy: Secondary | ICD-10-CM | POA: Diagnosis not present

## 2017-07-19 DIAGNOSIS — Z9079 Acquired absence of other genital organ(s): Secondary | ICD-10-CM | POA: Diagnosis not present

## 2017-07-19 DIAGNOSIS — Z79891 Long term (current) use of opiate analgesic: Secondary | ICD-10-CM | POA: Diagnosis not present

## 2017-07-19 DIAGNOSIS — Z9221 Personal history of antineoplastic chemotherapy: Secondary | ICD-10-CM | POA: Diagnosis not present

## 2017-07-19 DIAGNOSIS — K219 Gastro-esophageal reflux disease without esophagitis: Secondary | ICD-10-CM | POA: Diagnosis not present

## 2017-07-19 DIAGNOSIS — I10 Essential (primary) hypertension: Secondary | ICD-10-CM | POA: Diagnosis not present

## 2017-07-19 DIAGNOSIS — Z8619 Personal history of other infectious and parasitic diseases: Secondary | ICD-10-CM | POA: Diagnosis not present

## 2017-07-19 DIAGNOSIS — Z792 Long term (current) use of antibiotics: Secondary | ICD-10-CM | POA: Diagnosis not present

## 2017-07-19 DIAGNOSIS — Z6822 Body mass index (BMI) 22.0-22.9, adult: Secondary | ICD-10-CM | POA: Diagnosis not present

## 2017-07-19 DIAGNOSIS — R0602 Shortness of breath: Secondary | ICD-10-CM | POA: Diagnosis not present

## 2017-07-19 DIAGNOSIS — Z8719 Personal history of other diseases of the digestive system: Secondary | ICD-10-CM | POA: Diagnosis not present

## 2017-07-19 DIAGNOSIS — Z8546 Personal history of malignant neoplasm of prostate: Secondary | ICD-10-CM | POA: Diagnosis not present

## 2017-07-19 DIAGNOSIS — Z888 Allergy status to other drugs, medicaments and biological substances status: Secondary | ICD-10-CM | POA: Diagnosis not present

## 2017-07-19 DIAGNOSIS — G4733 Obstructive sleep apnea (adult) (pediatric): Secondary | ICD-10-CM | POA: Diagnosis not present

## 2017-07-19 DIAGNOSIS — Z794 Long term (current) use of insulin: Secondary | ICD-10-CM | POA: Diagnosis not present

## 2017-07-19 DIAGNOSIS — Z9225 Personal history of immunosupression therapy: Secondary | ICD-10-CM | POA: Diagnosis not present

## 2017-07-19 DIAGNOSIS — L989 Disorder of the skin and subcutaneous tissue, unspecified: Secondary | ICD-10-CM | POA: Diagnosis not present

## 2017-07-19 MED ORDER — DESONIDE 0.05 % TOPICAL OINTMENT
Freq: Two times a day (BID) | TOPICAL | 3 refills | 0.00000 days | Status: CP | PRN
Start: 2017-07-19 — End: 2017-07-21

## 2017-07-19 MED ORDER — MAGNESIUM OXIDE-MAGNESIUM AMINO ACID CHELATE 133 MG TABLET
ORAL_TABLET | Freq: Two times a day (BID) | ORAL | 0 refills | 0.00000 days
Start: 2017-07-19 — End: 2017-09-12

## 2017-07-19 NOTE — Telephone Encounter (Signed)
Called patient and left a voicemail regarding the addition of his appointment on 8/30.

## 2017-07-21 ENCOUNTER — Ambulatory Visit
Admission: RE | Admit: 2017-07-21 | Discharge: 2017-07-21 | Disposition: A | Discharge status: Home / Self Care | Payer: MEDICARE

## 2017-07-21 ENCOUNTER — Ambulatory Visit
Admission: RE | Admit: 2017-07-21 | Discharge: 2017-07-21 | Disposition: A | Discharge status: Home / Self Care | Payer: MEDICARE | Attending: Primary Care | Admitting: Primary Care

## 2017-07-21 DIAGNOSIS — Z9484 Stem cells transplant status: Secondary | ICD-10-CM

## 2017-07-21 DIAGNOSIS — D46Z Other myelodysplastic syndromes: Principal | ICD-10-CM

## 2017-07-21 DIAGNOSIS — A0472 Enterocolitis due to Clostridium difficile, not specified as recurrent: Secondary | ICD-10-CM

## 2017-07-21 DIAGNOSIS — R600 Localized edema: Secondary | ICD-10-CM

## 2017-07-21 DIAGNOSIS — E119 Type 2 diabetes mellitus without complications: Principal | ICD-10-CM

## 2017-07-21 DIAGNOSIS — C9201 Acute myeloblastic leukemia, in remission: Secondary | ICD-10-CM

## 2017-07-21 DIAGNOSIS — L988 Other specified disorders of the skin and subcutaneous tissue: Secondary | ICD-10-CM

## 2017-07-21 DIAGNOSIS — D89813 Graft-versus-host disease, unspecified: Secondary | ICD-10-CM

## 2017-07-21 DIAGNOSIS — D469 Myelodysplastic syndrome, unspecified: Secondary | ICD-10-CM | POA: Diagnosis not present

## 2017-07-21 DIAGNOSIS — M792 Neuralgia and neuritis, unspecified: Secondary | ICD-10-CM | POA: Diagnosis not present

## 2017-07-21 DIAGNOSIS — Z6821 Body mass index (BMI) 21.0-21.9, adult: Secondary | ICD-10-CM | POA: Diagnosis not present

## 2017-07-21 DIAGNOSIS — Z87891 Personal history of nicotine dependence: Secondary | ICD-10-CM | POA: Diagnosis not present

## 2017-07-21 DIAGNOSIS — Z9079 Acquired absence of other genital organ(s): Secondary | ICD-10-CM | POA: Diagnosis not present

## 2017-07-21 DIAGNOSIS — Z888 Allergy status to other drugs, medicaments and biological substances status: Secondary | ICD-10-CM | POA: Diagnosis not present

## 2017-07-21 DIAGNOSIS — Z9989 Dependence on other enabling machines and devices: Secondary | ICD-10-CM | POA: Diagnosis not present

## 2017-07-21 DIAGNOSIS — I10 Essential (primary) hypertension: Secondary | ICD-10-CM | POA: Diagnosis not present

## 2017-07-21 DIAGNOSIS — Z7952 Long term (current) use of systemic steroids: Secondary | ICD-10-CM | POA: Diagnosis not present

## 2017-07-21 DIAGNOSIS — R1310 Dysphagia, unspecified: Secondary | ICD-10-CM | POA: Diagnosis not present

## 2017-07-21 DIAGNOSIS — Z79899 Other long term (current) drug therapy: Secondary | ICD-10-CM | POA: Diagnosis not present

## 2017-07-21 DIAGNOSIS — Z9221 Personal history of antineoplastic chemotherapy: Secondary | ICD-10-CM | POA: Diagnosis not present

## 2017-07-21 DIAGNOSIS — Z792 Long term (current) use of antibiotics: Secondary | ICD-10-CM | POA: Diagnosis not present

## 2017-07-21 DIAGNOSIS — A0471 Enterocolitis due to Clostridium difficile, recurrent: Secondary | ICD-10-CM | POA: Diagnosis not present

## 2017-07-21 DIAGNOSIS — L989 Disorder of the skin and subcutaneous tissue, unspecified: Secondary | ICD-10-CM | POA: Diagnosis not present

## 2017-07-21 DIAGNOSIS — K219 Gastro-esophageal reflux disease without esophagitis: Secondary | ICD-10-CM | POA: Diagnosis not present

## 2017-07-21 DIAGNOSIS — J449 Chronic obstructive pulmonary disease, unspecified: Secondary | ICD-10-CM | POA: Diagnosis not present

## 2017-07-21 DIAGNOSIS — G4733 Obstructive sleep apnea (adult) (pediatric): Secondary | ICD-10-CM | POA: Diagnosis not present

## 2017-07-21 DIAGNOSIS — Z794 Long term (current) use of insulin: Secondary | ICD-10-CM | POA: Diagnosis not present

## 2017-07-21 MED ORDER — BLOOD SUGAR DIAGNOSTIC STRIPS
Freq: Four times a day (QID) | 6 refills | 0.00000 days | Status: CP
Start: 2017-07-21 — End: 2017-08-20

## 2017-07-21 MED ORDER — TACROLIMUS 0.5 MG CAPSULE
ORAL_CAPSULE | 6 refills | 0 days
Start: 2017-07-21 — End: 2017-09-12

## 2017-07-21 MED ORDER — PREDNISONE 5 MG TABLET
ORAL_TABLET | 1 refills | 0 days | Status: CP
Start: 2017-07-21 — End: 2017-08-02

## 2017-07-24 ENCOUNTER — Telehealth: Payer: Self-pay

## 2017-07-24 ENCOUNTER — Ambulatory Visit (HOSPITAL_BASED_OUTPATIENT_CLINIC_OR_DEPARTMENT_OTHER): Payer: Medicare Other

## 2017-07-24 DIAGNOSIS — D4622 Refractory anemia with excess of blasts 2: Secondary | ICD-10-CM

## 2017-07-24 DIAGNOSIS — Z452 Encounter for adjustment and management of vascular access device: Secondary | ICD-10-CM

## 2017-07-24 DIAGNOSIS — D509 Iron deficiency anemia, unspecified: Secondary | ICD-10-CM

## 2017-07-24 MED ORDER — ALTEPLASE 2 MG IJ SOLR
2.0000 mg | Freq: Once | INTRAMUSCULAR | Status: DC | PRN
  Filled 2017-07-24: qty 2

## 2017-07-24 MED ORDER — SODIUM CHLORIDE 0.9 % IJ SOLN
3.0000 mL | Freq: Once | INTRAMUSCULAR | Status: DC | PRN
  Filled 2017-07-24: qty 10

## 2017-07-24 MED ORDER — HEPARIN SOD (PORK) LOCK FLUSH 100 UNIT/ML IV SOLN
500.0000 [IU] | Freq: Once | INTRAVENOUS | Status: DC | PRN
  Filled 2017-07-24: qty 5

## 2017-07-24 MED ORDER — HEPARIN SOD (PORK) LOCK FLUSH 100 UNIT/ML IV SOLN
250.0000 [IU] | Freq: Once | INTRAVENOUS | Status: AC
  Administered 2017-07-24: 250 [IU]
  Filled 2017-07-24: qty 5

## 2017-07-24 MED ORDER — SODIUM CHLORIDE 0.9% FLUSH
10.0000 mL | Freq: Once | INTRAVENOUS | Status: DC
  Filled 2017-07-24: qty 10

## 2017-07-24 MED ORDER — SODIUM CHLORIDE 0.9 % IV SOLN: Freq: Once | INTRAVENOUS | Status: DC

## 2017-07-24 MED ORDER — HEPARIN SOD (PORK) LOCK FLUSH 100 UNIT/ML IV SOLN
250.0000 [IU] | Freq: Once | INTRAVENOUS | Status: AC | PRN
  Administered 2017-07-24: 16:00:00
  Filled 2017-07-24: qty 5

## 2017-07-24 MED ORDER — SODIUM CHLORIDE 0.9 % IJ SOLN
10.0000 mL | INTRAMUSCULAR | Status: DC | PRN
  Administered 2017-07-24 (×2): 10 mL
  Filled 2017-07-24: qty 10

## 2017-07-24 NOTE — Telephone Encounter (Signed)
Pt called to verify Thursday appt. Additionally would like to have PICC dressing change this afternoon. Appt scheduled for 1500. Pt aware and in agreement. Request for extension tubing for easier at home use. Flush LPN notified.

## 2017-07-25 ENCOUNTER — Telehealth: Payer: Self-pay | Admitting: Hematology

## 2017-07-25 MED ORDER — DAPSONE 100 MG TABLET
ORAL_TABLET | Freq: Every day | ORAL | 4 refills | 0 days | Status: CP
Start: 2017-07-25 — End: 2017-12-27

## 2017-07-25 NOTE — Telephone Encounter (Signed)
Spoke with his wife regarding the slight change in his schedule.

## 2017-07-27 ENCOUNTER — Ambulatory Visit (HOSPITAL_BASED_OUTPATIENT_CLINIC_OR_DEPARTMENT_OTHER): Payer: Medicare Other | Admitting: Hematology

## 2017-07-27 ENCOUNTER — Other Ambulatory Visit (HOSPITAL_BASED_OUTPATIENT_CLINIC_OR_DEPARTMENT_OTHER): Payer: Medicare Other

## 2017-07-27 ENCOUNTER — Encounter: Payer: Self-pay | Admitting: Hematology

## 2017-07-27 ENCOUNTER — Other Ambulatory Visit: Payer: Medicare Other

## 2017-07-27 ENCOUNTER — Ambulatory Visit: Payer: Medicare Other

## 2017-07-27 VITALS — BP 118/67 | HR 86 | Temp 98.3°F | Resp 17 | Wt 127.4 lb

## 2017-07-27 DIAGNOSIS — D469 Myelodysplastic syndrome, unspecified: Secondary | ICD-10-CM

## 2017-07-27 DIAGNOSIS — C9200 Acute myeloblastic leukemia, not having achieved remission: Secondary | ICD-10-CM | POA: Diagnosis not present

## 2017-07-27 DIAGNOSIS — C9201 Acute myeloblastic leukemia, in remission: Secondary | ICD-10-CM

## 2017-07-27 DIAGNOSIS — D4622 Refractory anemia with excess of blasts 2: Secondary | ICD-10-CM

## 2017-07-27 DIAGNOSIS — D509 Iron deficiency anemia, unspecified: Secondary | ICD-10-CM

## 2017-07-27 LAB — COMPREHENSIVE METABOLIC PANEL
ALK PHOS: 101 U/L (ref 40–150)
ALT: 23 U/L (ref 0–55)
AST: 22 U/L (ref 5–34)
Albumin: 3.5 g/dL (ref 3.5–5.0)
Anion Gap: 7 mEq/L (ref 3–11)
BUN: 37.2 mg/dL — ABNORMAL HIGH (ref 7.0–26.0)
CALCIUM: 8.6 mg/dL (ref 8.4–10.4)
CO2: 29 mEq/L (ref 22–29)
Chloride: 105 mEq/L (ref 98–109)
Creatinine: 1.4 mg/dL — ABNORMAL HIGH (ref 0.7–1.3)
EGFR: 48 mL/min/{1.73_m2} — AB (ref >90)
Glucose: 269 mg/dl — ABNORMAL HIGH (ref 70–140)
POTASSIUM: 4.3 meq/L (ref 3.5–5.1)
Sodium: 140 mEq/L (ref 136–145)
TOTAL PROTEIN: 5.6 g/dL — AB (ref 6.4–8.3)
Total Bilirubin: 0.97 mg/dL (ref 0.20–1.20)

## 2017-07-27 LAB — CBC & DIFF AND RETIC
BASO%: 0.2 % (ref 0.0–2.0)
BASOS ABS: 0 10*3/uL (ref 0.0–0.1)
EOS ABS: 0 10*3/uL (ref 0.0–0.5)
EOS%: 0.1 % (ref 0.0–7.0)
HEMATOCRIT: 27 % — AB (ref 38.4–49.9)
HGB: 8.2 g/dL — ABNORMAL LOW (ref 13.0–17.1)
IMMATURE RETIC FRACT: 17.2 % — AB (ref 3.00–10.60)
LYMPH#: 0.4 10*3/uL — AB (ref 0.9–3.3)
LYMPH%: 5.1 % — ABNORMAL LOW (ref 14.0–49.0)
MCH: 34.3 pg — ABNORMAL HIGH (ref 27.2–33.4)
MCHC: 30.4 g/dL — AB (ref 32.0–36.0)
MCV: 113 fL — ABNORMAL HIGH (ref 79.3–98.0)
MONO#: 0.5 10*3/uL (ref 0.1–0.9)
MONO%: 6.3 % (ref 0.0–14.0)
NEUT#: 7.3 10*3/uL — ABNORMAL HIGH (ref 1.5–6.5)
NEUT%: 88.3 % — AB (ref 39.0–75.0)
Platelets: 150 10*3/uL (ref 140–400)
RBC: 2.39 10*6/uL — ABNORMAL LOW (ref 4.20–5.82)
RDW: 22.3 % — AB (ref 11.0–14.6)
RETIC %: 6.86 % — AB (ref 0.80–1.80)
RETIC CT ABS: 163.95 10*3/uL — AB (ref 34.80–93.90)
WBC: 8.3 10*3/uL (ref 4.0–10.3)

## 2017-07-27 MED ORDER — SODIUM CHLORIDE 0.9% FLUSH
10.0000 mL | Freq: Once | INTRAVENOUS | Status: AC
  Administered 2017-07-27: 10 mL
  Filled 2017-07-27: qty 10

## 2017-07-27 MED ORDER — HEPARIN SOD (PORK) LOCK FLUSH 100 UNIT/ML IV SOLN
500.0000 [IU] | Freq: Once | INTRAVENOUS | Status: AC | PRN
  Administered 2017-07-27: 500 [IU]
  Filled 2017-07-27: qty 5

## 2017-07-27 NOTE — Progress Notes (Signed)
[name]   HEMATOLOGY/ONCOLOGY CLINIC NOTE  Date of Service: .07/27/2017  Patient Care Team: Wenda Low, MD as PCP - General (Internal Medicine) [name], MD as Attending Physician (Oncology)  Dr Kathleen Lime [facility] Transplant)  CHIEF COMPLAINTS: Follow-up for MDS  DIAGNOSIS RAEB-2 with about 15% blasts. Trisomy 8 (Intermediate risk) on FISH studies. R-ISS ( score 7- very high risk) With transformation to AML.  ONCOLOGIC HISTORY  Diagnosis: Myelodysplastic Syndrome, RAEB-2  Genetics: Karyotype/FISH: 47,XY,+8 in 17/20 metaphases. Interphase FISH showed 7q31 loss in 1.5% of cells, below the cutoff of 5% and technically negative  Molecular Genetics: [facility] showed TET2, ASXL1, BCOR RUNX1 SETBP1 SRSF2 and STAG2 mutations   Pertinent Phenotypic data: blasts by IHC positive for CD34 and CD117  Disease-specific prognostic estimation: IPSS-R score 7.1, very high risk, median OS 0.8 years, 25% risk of AML at 0.7 years  10/16: hospitalized for fever and fond to have MSSA bacteremia with empyema and possible septic emboli; no cardiac vegetations noted  12/13/16: CPX-351  02/28/17: CR with normal FISh and 20% cellular marrow   MDS (myelodysplastic syndrome) (CMS-HCC)  05/02/2016 Initial Diagnosis  MDS (myelodysplastic syndrome) (RAF-HCC)    Myelodysplastic syndrome, high grade (CMS-HCC)  03/21/2016 Initial Diagnosis  Myelodysplastic syndrome, high grade (RAF-HCC), WBC 1.6 ANC 0.4, Hgb 8.4, platelets 231, BM Bx 14% (aspirate) 15% by [facility]   04/04/2016 - Chemotherapy  cycle 1 azacitidine 75 mg/m2 [place] x 7 days   05/02/2016 - Chemotherapy  cycle 2 azacitidine 75 mg/m2 [place] x 7 days   05/30/2016 - Chemotherapy  Cycle 3 azacitidine 75 mg/m2 [place] x 7 days   06/27/2016 - Chemotherapy  Cycle 4 azacitidine 75 mg/m2 [place] x 7 days   07/19/2016 Biopsy  Bone marrow biopsy with 10% blasts by aspirate differential, 10-15% by CD34 IHC staining, 7% blasts by flow cytometry   07/25/2016 -  Chemotherapy  Cycle 5 azacitidine 75 mg/m2  x 7 days   08/30/2016 - Chemotherapy  Cycle 6 azacitidine 75 mg/m2  x 7 days    Acute myeloid leukemia (AML) with prior myelodysplasia (CMS-HCC)  11/30/2016 Initial Diagnosis  Acute myeloid leukemia (AML) with prior myelodysplasia--20% blasts by CD34 IHC of bone marrow biopsy   12/13/2016 - Chemotherapy  CPX-351   03/11/2017 - 03/16/2017 Chemotherapy  RIC Fludarabine/Busulfan   03/17/2017 Transplant  MUD 10/10 SCT.  CD34 DOSE INFUSE Latest Units: x10 6thcells/kg 5.02    INTERVAL HISTORY  Mr Dock is here for his reestablishing local oncologic follow-up after having had an allogeneic transplant for his RAEB 2/AML at [facility]. He is about day 135 post transplant. Continues to follow with Dr. Kathleen Lime. Has had significant issues with recurrent C. difficile infection and is on a taper dose of oral vancomycin at this time. There was also some concern about having grade 1 acute GVHD in the GI track for which she does have a follow-up with gastric oncology at [facility]. 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 Patient notes some fatigue. He notes that he's been eating better and his albumin level seemed to be improving. Hasn't really significantly started to gain back weight.  A couple of days later the patient called with some increased fatigue and was set up for 1 unit of PRBC transfusion on 07/28/2017.  Patient has several follow-up appointments with this transplant team at [facility] for continued management post transplant.  No fevers or chills at this time will  night sweats. Currently he has no overt evidence of AML recurrence.  MEDICAL HISTORY:  Past Medical History:  Diagnosis Date  . Cancer [facility])    Prostate, Melanoma- lt. Shoulder (no further problems since 2010  . Chronic diastolic CHF (congestive heart failure) ([place]) 09/13/2016  . Complication of anesthesia    versed  gave the adverse reaction pt. ,became aggitated  . COPD (chronic obstructive pulmonary disease) (HCC)    Dr. Annamaria Boots follows  . Diffuse esophageal spasm   . Diverticulosis   . DJD (degenerative joint disease)   . Multiple trauma     horse accident 2003 L SHOULDER  . Oral herpes   . OSA (obstructive sleep apnea)    mild, rare cpap use  . Peripheral neuropathy ([place])   . Pulmonary nodule   . Spinal stenosis   . Systolic hypertension   Recurrent HSV related cold sores. Acoustic neuroma left 1999 - s/p surgery. Pruritus ani Injury in 2003 related to a horse accident resulting in hydrothorax, pneumothorax, fractured ribs, dislocated left shoulder, rotator cuff tear on the left, left elbow damage and left ulnar nerve damage.   SURGICAL HISTORY: Past Surgical History:  Procedure Laterality Date  . ,laceration to head in accident with hourse    . ACOUSTIC NEUROMA LEFT EAR  1999  . BALLOON DILATION N/A 11/25/2014   Procedure: BALLOON DILATION;  Surgeon: Garlan Fair, MD;  Location: [facility];  Service: Endoscopy;  Laterality: N/A;  . COLONOSCOPY WITH PROPOFOL N/A 10/01/2013   Procedure: COLONOSCOPY WITH PROPOFOL;  Surgeon: Garlan Fair, MD;  Location: [facility] ENDOSCOPY;  Service: Endoscopy;  Laterality: N/A;  . ESOPHAGOGASTRODUODENOSCOPY (EGD) WITH PROPOFOL N/A 11/25/2014   Procedure: ESOPHAGOGASTRODUODENOSCOPY (EGD) WITH PROPOFOL;  Surgeon: Garlan Fair, MD;  Location: [facility] ENDOSCOPY;  Service: Endoscopy;  Laterality: N/A;  . EYE SURGERY     cosmetic surgery"brow lift" 4'15  . MULTIPLE PROCEDURES FOR LEFT ARM     ELBOW,RIB FX PNEUMOTHORAX AFTER FALLING OFF MOUNTAIN  . PILONIDAL CYST / SINUS EXCISION    . RADICAL PROSTATECTOMY    . TEE WITHOUT CARDIOVERSION N/A 09/20/2016   Procedure: TRANSESOPHAGEAL ECHOCARDIOGRAM (TEE);  Surgeon: Jerline Pain, MD;  Location: [facility] ENDOSCOPY;  Service: Cardiovascular;  Laterality: N/A;    SOCIAL HISTORY: Social History   Social History  .  Marital status: Married    Spouse name: N/A  . Number of children: N/A  . Years of education: N/A   Occupational History  . Not on file.   Social History Main Topics  . Smoking status: Former Smoker    Packs/day: 1.00    Years: 40.00    Quit date: 11/29/1995  . Smokeless tobacco: Never Used  . Alcohol use Yes     Comment: 1 drink per day  . Drug use: No  . Sexual activity: Not on file   Other Topics Concern  . Not on file   Social History Narrative   CPAP 8 advanced   Patient drinks 5 cups of caffeine daily   Exercises 3-4 times weekly   Son is a pulmonologist    FAMILY HISTORY: Family History  Problem Relation Age of Onset  . Emphysema Father   . Asthma Father   . Lung cancer Father   . Breast cancer Sister   . Breast cancer Sister   . Skin cancer Brother   . Prostate cancer Brother    Brother with a recent diagnosis of hemachromatosis Brother with CLL/SLL Mother had anemia, thalassemia. Hepatitis C  with liver cancer  ALLERGIES:  is allergic to fluconazole and midazolam.  MEDICATIONS:  Current Outpatient Prescriptions  Medication Sig Dispense Refill  . calcium carbonate (TUMS - DOSED IN MG ELEMENTAL CALCIUM) 500 MG chewable tablet Chew 1 tablet by mouth at bedtime.     . chlorhexidine (PERIDEX) 0.12 % solution RINSE 10 ML (CC) IN MOUTH TWICE DAILY AS DIRECTED **SWISH  AND  SPIT,  DO  NOT  SWALLOW** 473 mL 1  . ciprofloxacin (CIPRO) 500 MG tablet Take 1 tablet (500 mg total) by mouth 2 (two) times daily. 60 tablet 1  . doxycycline (VIBRAMYCIN) 100 MG capsule Take 1 capsule (100 mg total) by mouth 2 (two) times daily. 60 capsule 11  . magic mouthwash SOLN Take 5 mLs by mouth 4 (four) times daily as needed for mouth pain. 480 mL 0  . Multiple Vitamin (MULTIVITAMIN WITH MINERALS) TABS tablet Take 1 tablet by mouth daily.    . nitroGLYCERIN (NITROSTAT) 0.4 MG SL tablet Place 1 tablet (0.4 mg total) under the tongue every 5 (five) minutes as needed for chest pain. 25  tablet 1  . omeprazole (PRILOSEC) 40 MG capsule Take 40 mg by mouth daily before lunch. 1/2 hour before.    . ondansetron (ZOFRAN) 4 MG tablet Take 4 mg by mouth every 8 (eight) hours as needed for nausea or vomiting.     . posaconazole (NOXAFIL) 100 MG TBEC delayed-release tablet Take 3 tablets (300 mg total) by mouth daily. (Patient taking differently: Take 300 mg by mouth at bedtime. ) 90 tablet 0  . senna-docusate (SENNA S) 8.6-50 MG tablet Take 2 tablets by mouth 2 (two) times daily as needed for mild constipation or moderate constipation.    . valACYclovir (VALTREX) 500 MG tablet TAKE ONE TABLET BY MOUTH TWICE DAILY AT  NIGHT  TO  SUPPRESS  HERPES  GINGIVITIS 60 tablet 1  . valsartan (DIOVAN) 80 MG tablet Take 40 mg by mouth at bedtime.      No current facility-administered medications for this visit.    Facility-Administered Medications Ordered in Other Visits  Medication Dose Route Frequency Provider Last Rate Last Dose  . heparin lock flush 100 unit/mL  500 Units Intracatheter Once PRN Johney Maine, MD      . sodium chloride flush (NS) 0.9 % injection 10 mL  10 mL Intracatheter Once Johney Maine, MD        REVIEW OF SYSTEMS:    10 Point review of Systems was done is negative except as noted above.  PHYSICAL EXAMINATION: ECOG PERFORMANCE STATUS: 1 - Symptomatic but completely ambulatory  . Vitals:   07/27/17 1512  BP: 118/67  Pulse: 86  Resp: 17  Temp: 98.3 F (36.8 C)  SpO2: 97%   Filed Weights   07/27/17 1512  Weight: 127 lb 6.4 oz (57.8 kg)   .Body mass index is 19.95 kg/m.  GENERAL:alert, in no acute distress and comfortable SKIN: Resolving rash on his upper extremities EYES: normal, conjunctiva are pink and non-injected, sclera clear OROPHARYNX:no exudate, no erythema and lips, buccal mucosa, and tongue normal  NECK: supple, no JVD, thyroid normal size, non-tender, without nodularity LYMPH:  no palpable lymphadenopathy in the cervical,  axillary or inguinal LUNGS: clear to auscultation With somewhat decreased entry bilaterally, no wheezing HEART: regular rate & rhythm,  no murmurs and no lower extremity edema ABDOMEN: abdomen soft, non-tender, normoactive bowel sounds , no palpable hepatosplenomegaly Musculoskeletal: no cyanosis of digits. PSYCH: alert & oriented x  3 with fluent speech NEURO: no focal motor deficits.  LABORATORY DATA:  I have reviewed the data as listed  . CBC Latest Ref Rng & Units 07/28/2017 07/27/2017 12/07/2016  WBC 4.0 - 10.5 K/uL 8.1 8.3 0.9(LL)  Hemoglobin 13.0 - 17.0 g/dL 8.1(L) 8.2(L) 8.8(L)  Hematocrit 39.0 - 52.0 % 25.5(L) 27.0(L) 28.4(L)  Platelets 150 - 400 K/uL 153 150 57(L)    . CMP Latest Ref Rng & Units 07/27/2017 11/07/2016 10/31/2016  Glucose 70 - 140 mg/dl 269(H) 100 98  BUN 7.0 - 26.0 mg/dL 37.2(H) 18.7 15.3  Creatinine 0.7 - 1.3 mg/dL 1.4(H) 1.1 0.9  Sodium 136 - 145 mEq/L 140 142 140  Potassium 3.5 - 5.1 mEq/L 4.3 4.1 4.0  Chloride 101 - 111 mmol/L - - -  CO2 22 - 29 mEq/L '29 25 24  '$ Calcium 8.4 - 10.4 mg/dL 8.6 8.3(L) 8.8  Total Protein 6.4 - 8.3 g/dL 5.6(L) 6.4 6.5  Total Bilirubin 0.20 - 1.20 mg/dL 0.97 2.07(H) 1.96(H)  Alkaline Phos 40 - 150 U/L 101 74 79  AST 5 - 34 U/L 22 46(H) 57(H)  ALT 0 - 55 U/L '23 28 19    '$ OSS AND MICROSCOPIC INFORMATION RADIOGRAPHIC STUDIES: I have personally reviewed the radiological images as listed and agreed with the findings in the report. No results found.  ASSESSMENT & PLAN:   76 y/o male with AML arising from MDS(RAEB-2) now day +135 s/p MUD allo sct with RIC Bu/Flu conditioning. He has had multiple C.Diff infections and skin acute GVHD and possible Grade 1 Gi GVHD   1) Diarrhea: Recurrent C. Diff  - C. Diff + 03/31/17, 05/08/17, and 06/24/17.  - Previously on vanc taper down to '125mg'$  MWF on admission from second recurrent episode.   GI pathogen panel is otherwise negative.  -06/28/17: Flex sig preliminary results show diffuse  pseudomembrane consistent with CDiff colitis. Continues antibiotics and await pathology. -06/30/17: Surgical pathology: pseudomembranes with crypt cell apoptosis without crypt dropout. This is most likely from toxigenic effects from [facility] and can't exclude grade 1 GVHD. C.Diff treatment plan:    - Fidaxomicin '200mg'$  BID (7/30- stopped 8/8)    - Flagyl IV 07/02/17-stopped 07/06/17    - Bezlotoxumab given 8/7    - Oral vancomycin 500 mg QID (started 06/30/17 - dose reduced to '250mg'$  8/7- dose reduced to '125mg'$  8/10 will need taper regimen:                         -->125 mg 4 times daily x 14 days thru 8/23                         -->125 mg BID x 7 days 8/24 thru 8/30                         -->125 mg daily x 7 days 8/31-9/6                         -->125 mg every other day for 21 days ending 9/27 PLAN -Patient has follow-up at [facility] for continued follow-up and management of his oral vancomycin and GI evaluation for GI GVHD and consideration for fecal transplantation in the setting of recurrent C. difficile colitis . -Currently the patient's diarrhea is somewhat better . he is eating better and appears to have   improvement in his albumin  levels up to 3.5 . -He continues to be on decreasing doses of prednisone which should also be helpful in keeping his immune suppression at a was possible to allow for clearance of his C. Difficile.  2) Secondary AML arising from MDS. Now in CR.  BMT:  Conditioning regimen: Fludarabine/Busulfan Donor studies:             - 10/10 MUD allo SCT Cell dose: 5.02 x 10^6 CD34+ cells Post-SCT chimerisms/marrow: - Day 30 BmBx 04/19/12 [>95% Donor Unfractionated, 90% Donor CD3+] - Day 60 chimerisms peripheral blood [>95% Donor Unfractionated, 92% Donor CD3+]. - Day 90 BmBx 06/14/18:   Hypercellular bone marrow (40%) with trilineage hematopoiesis and 1% blasts by manual aspirate differential. Normal cytogenetics. MRD negative. [ >95% donor in UF from BM and PB. 94% donor CD3  in BM and 91% donor CD3 in PB]. - Repeat PB chimerism 07/21/2017 show 95% donor CD3+ T cell and 5% receipient. Specimen Type Blood   Unfract. Recipient: <5 %  Unfract. Donor: >95 %  DNA Fingerprinting, Post-trans-Unfractionated Assay Results INTERPRETATION: This sample contains greater than 95% cells of donor origin, consistent with engraftment.     3) Acute Skin and possible mild GI GVHD PLAN -No overt evidence of AML recurrence at this time or loss of donor chimerism which is still more than 95% as of 07/21/2017 . -Management of acute GVHD and prednisone adjustment for transplant team . -Continues to be on tacrolimus for GVHD prophylaxis as per transplant team . - Transfusion criteria: CMV negative irradiated blood products only  - Hgb<8 or ifsymptomatic  and Plts <10K or bleeding  - No history of transfusion reactions. - Received 1 units PRBC on 8/31    ID:  Prophylaxis:  - Valtrex 500 mg po qd.  - Posaconazole while on steroids.   - Dapsone 100 mg qd (bactrim stopped due to AKI)     Neuro/Pain: - Oxycodone prn.    Bone Health:  04/17/17: DEXA scan with T score of 1.3. Creatinine improved, first dose of   Zometa given (6/11)   Deconditioning: - given referral to establish advanced home care for physical therapy.  We shall be available to help with local oncologic cares as needed and help provide transfusion support primary post transplant care will be driven by [facility] Transplant team  RTC with Dr Irene Limbo in 4 weeks with labs  I spent 30 minutes counseling the patient face to face. The total time spent in the appointment was 40 minutes and more than 50% was on counseling and direct patient cares    Sullivan Lone MD [place] AAHIVMS [facility] Hematology/Oncology Physician [facility]  (Office):       [phone] (Work cell):  [phone] (Fax):           [phone]

## 2017-07-27 NOTE — Patient Instructions (Signed)
Thank you for choosing [facility] to provide your oncology and hematology care.  To afford each patient quality time with our providers, please arrive 30 minutes before your scheduled appointment time.  If you arrive late for your appointment, you may be asked to reschedule.  We strive to give you quality time with our providers, and arriving late affects you and other patients whose appointments are after yours.   If you are a no show for multiple scheduled visits, you may be dismissed from the clinic at the providers discretion.    Again, thank you for choosing [facility], our hope is that these requests will decrease the amount of time that you wait before being seen by our physicians.  ______________________________________________________________________  Should you have questions after your visit to the [facility], please contact our office at [phone] between the hours of 8:30 and 4:30 p.m.    Voicemails left after 4:30p.m will not be returned until the following business day.    For prescription refill requests, please have your pharmacy contact us directly.  Please also try to allow 48 hours for prescription requests.    Please contact the scheduling department for questions regarding scheduling.  For scheduling of procedures such as PET scans, CT scans, MRI, Ultrasound, etc please contact central scheduling at [phone].    Resources For Cancer Patients and Caregivers:   Oncolink.org:  A wonderful resource for patients and healthcare providers for information regarding your disease, ways to tract your treatment, what to expect, etc.     American Cancer Society:  800-227-2345  Can help patients locate various types of support and financial assistance  Cancer Care: 1-800-813-HOPE (4673) Provides financial assistance, online support groups, medication/co-pay assistance.    [place] DSS:  336-641-3447 Where to apply for food  stamps, Medicaid, and utility assistance  Medicare Rights Center: 800-333-4114 Helps people with Medicare understand their rights and benefits, navigate the Medicare system, and secure the quality healthcare they deserve  SCAT: 336-333-6589 [place] [employer]'s shared-ride transportation service for eligible riders who have a disability that prevents them from riding the fixed route bus.    For additional information on assistance programs please contact our social worker:   Grier Hock/Abigail Elmore:  [phone]            

## 2017-07-28 ENCOUNTER — Telehealth: Payer: Self-pay

## 2017-07-28 ENCOUNTER — Ambulatory Visit (HOSPITAL_COMMUNITY)
Admission: RE | Admit: 2017-07-28 | Discharge: 2017-07-28 | Disposition: A | Discharge status: Home / Self Care | Payer: Medicare Other | Source: Ambulatory Visit | Attending: Hematology | Admitting: Hematology

## 2017-07-28 ENCOUNTER — Telehealth: Payer: Self-pay | Admitting: Hematology

## 2017-07-28 ENCOUNTER — Other Ambulatory Visit (HOSPITAL_COMMUNITY)
Admission: AD | Admit: 2017-07-28 | Discharge: 2017-07-28 | Disposition: A | Discharge status: Home / Self Care | Payer: Medicare Other | Source: Ambulatory Visit | Attending: Hematology | Admitting: Hematology

## 2017-07-28 ENCOUNTER — Other Ambulatory Visit: Payer: Self-pay

## 2017-07-28 ENCOUNTER — Ambulatory Visit (HOSPITAL_BASED_OUTPATIENT_CLINIC_OR_DEPARTMENT_OTHER): Payer: Medicare Other

## 2017-07-28 DIAGNOSIS — D4622 Refractory anemia with excess of blasts 2: Secondary | ICD-10-CM | POA: Diagnosis not present

## 2017-07-28 DIAGNOSIS — Z95828 Presence of other vascular implants and grafts: Secondary | ICD-10-CM

## 2017-07-28 DIAGNOSIS — D649 Anemia, unspecified: Secondary | ICD-10-CM | POA: Insufficient documentation

## 2017-07-28 DIAGNOSIS — D469 Myelodysplastic syndrome, unspecified: Secondary | ICD-10-CM | POA: Insufficient documentation

## 2017-07-28 LAB — CBC WITH DIFFERENTIAL/PLATELET
BASOS ABS: 0 10*3/uL (ref 0.0–0.1)
BASOS PCT: 0 %
Eosinophils Absolute: 0.1 10*3/uL (ref 0.0–0.7)
Eosinophils Relative: 1 %
HCT: 25.5 % — ABNORMAL LOW (ref 39.0–52.0)
HEMOGLOBIN: 8.1 g/dL — AB (ref 13.0–17.0)
LYMPHS ABS: 0.5 10*3/uL — AB (ref 0.7–4.0)
Lymphocytes Relative: 6 %
MCH: 35.5 pg — AB (ref 26.0–34.0)
MCHC: 31.8 g/dL (ref 30.0–36.0)
MCV: 111.8 fL — ABNORMAL HIGH (ref 78.0–100.0)
MONO ABS: 0.6 10*3/uL (ref 0.1–1.0)
Monocytes Relative: 8 %
NEUTROS ABS: 6.9 10*3/uL (ref 1.7–7.7)
Neutrophils Relative %: 85 %
PLATELETS: 153 10*3/uL (ref 150–400)
RBC: 2.28 MIL/uL — ABNORMAL LOW (ref 4.22–5.81)
RDW: 22.2 % — AB (ref 11.5–15.5)
WBC: 8.1 10*3/uL (ref 4.0–10.5)

## 2017-07-28 LAB — RETICULOCYTES
RBC.: 2.28 MIL/uL — ABNORMAL LOW (ref 4.22–5.81)
RETIC CT PCT: 6.2 % — AB (ref 0.4–3.1)
Retic Count, Absolute: 141.4 10*3/uL (ref 19.0–186.0)

## 2017-07-28 LAB — PREPARE RBC (CROSSMATCH)

## 2017-07-28 MED ORDER — HEPARIN SOD (PORK) LOCK FLUSH 100 UNIT/ML IV SOLN
500.0000 [IU] | Freq: Once | INTRAVENOUS | Status: AC
  Administered 2017-07-28: 500 [IU] via INTRAVENOUS
  Filled 2017-07-28: qty 5

## 2017-07-28 MED ORDER — SODIUM CHLORIDE 0.9% FLUSH
10.0000 mL | INTRAVENOUS | Status: AC | PRN
  Administered 2017-07-28: 10 mL via INTRAVENOUS
  Filled 2017-07-28: qty 10

## 2017-07-28 NOTE — Telephone Encounter (Signed)
Pt Hgb 8.1. Will come in tomorrow morning for 1U PRBCs at 0930. [facility] lab running T&S. Communicated to [facility] PRBCs need to be CMV negative and irradiated. Left message with pt wife who verbalized understanding and will let pt know when he gets home.

## 2017-07-28 NOTE — Telephone Encounter (Signed)
Spoke with patient concerning upcoming appointment for 9/28. Per los

## 2017-07-28 NOTE — Telephone Encounter (Signed)
Confirmed 9/1 blood transfusion at 0930 per sch msg

## 2017-07-28 NOTE — Telephone Encounter (Signed)
Pt requesting labs to be emailed. Communicated that we cannot send information electronically to the pt in that way. Pt okay to receive them by mail. Labs printed and placed on mail out shelf.

## 2017-07-29 ENCOUNTER — Other Ambulatory Visit: Payer: Self-pay

## 2017-07-29 ENCOUNTER — Ambulatory Visit: Payer: Medicare Other

## 2017-07-29 DIAGNOSIS — I10 Essential (primary) hypertension: Secondary | ICD-10-CM | POA: Diagnosis not present

## 2017-07-29 DIAGNOSIS — K5790 Diverticulosis of intestine, part unspecified, without perforation or abscess without bleeding: Secondary | ICD-10-CM | POA: Diagnosis not present

## 2017-07-29 DIAGNOSIS — T380X5D Adverse effect of glucocorticoids and synthetic analogues, subsequent encounter: Secondary | ICD-10-CM | POA: Diagnosis not present

## 2017-07-29 DIAGNOSIS — Z8546 Personal history of malignant neoplasm of prostate: Secondary | ICD-10-CM | POA: Diagnosis not present

## 2017-07-29 DIAGNOSIS — E119 Type 2 diabetes mellitus without complications: Secondary | ICD-10-CM | POA: Diagnosis not present

## 2017-07-29 DIAGNOSIS — C9200 Acute myeloblastic leukemia, not having achieved remission: Secondary | ICD-10-CM | POA: Diagnosis not present

## 2017-07-29 DIAGNOSIS — D649 Anemia, unspecified: Secondary | ICD-10-CM

## 2017-07-29 DIAGNOSIS — E440 Moderate protein-calorie malnutrition: Secondary | ICD-10-CM | POA: Diagnosis not present

## 2017-07-29 DIAGNOSIS — D469 Myelodysplastic syndrome, unspecified: Secondary | ICD-10-CM | POA: Diagnosis not present

## 2017-07-29 DIAGNOSIS — G720 Drug-induced myopathy: Secondary | ICD-10-CM | POA: Diagnosis not present

## 2017-07-29 DIAGNOSIS — M1990 Unspecified osteoarthritis, unspecified site: Secondary | ICD-10-CM | POA: Diagnosis not present

## 2017-07-29 DIAGNOSIS — Z9484 Stem cells transplant status: Secondary | ICD-10-CM | POA: Diagnosis not present

## 2017-07-29 DIAGNOSIS — Z87891 Personal history of nicotine dependence: Secondary | ICD-10-CM | POA: Diagnosis not present

## 2017-07-29 LAB — ABO/RH: ABO/RH(D): O POS

## 2017-07-29 MED ORDER — ACETAMINOPHEN 325 MG PO TABS
ORAL_TABLET | ORAL | Status: AC
  Filled 2017-07-29: qty 2

## 2017-07-29 MED ORDER — DIPHENHYDRAMINE HCL 25 MG PO CAPS
ORAL_CAPSULE | ORAL | Status: AC
Start: 2017-07-29 — End: ?
  Filled 2017-07-29: qty 1

## 2017-07-29 MED ORDER — SODIUM CHLORIDE 0.9 % IV SOLN
250.0000 mL | Freq: Once | INTRAVENOUS | Status: AC
  Administered 2017-07-29: 250 mL via INTRAVENOUS

## 2017-07-29 MED ORDER — SODIUM CHLORIDE 0.9% FLUSH
10.0000 mL | INTRAVENOUS | Status: AC | PRN
  Administered 2017-07-29: 10 mL
  Filled 2017-07-29: qty 10

## 2017-07-29 MED ORDER — DIPHENHYDRAMINE HCL 25 MG PO CAPS
25.0000 mg | ORAL_CAPSULE | Freq: Once | ORAL | Status: AC
  Administered 2017-07-29: 25 mg via ORAL

## 2017-07-29 MED ORDER — ACETAMINOPHEN 325 MG PO TABS
650.0000 mg | ORAL_TABLET | Freq: Once | ORAL | Status: AC
  Administered 2017-07-29: 650 mg via ORAL

## 2017-07-29 MED ORDER — HEPARIN SOD (PORK) LOCK FLUSH 100 UNIT/ML IV SOLN
250.0000 [IU] | INTRAVENOUS | Status: AC | PRN
  Administered 2017-07-29: 250 [IU]
  Filled 2017-07-29: qty 5

## 2017-07-29 NOTE — Patient Instructions (Signed)

## 2017-07-30 LAB — TYPE AND SCREEN
ABO/RH(D): O POS
Antibody Screen: NEGATIVE
UNIT DIVISION: 0

## 2017-07-30 LAB — BPAM RBC
Blood Product Expiration Date: 201809292359
ISSUE DATE / TIME: 201809010931
UNIT TYPE AND RH: 5100

## 2017-08-01 ENCOUNTER — Telehealth: Payer: Self-pay

## 2017-08-01 ENCOUNTER — Ambulatory Visit (HOSPITAL_COMMUNITY)
Admission: RE | Admit: 2017-08-01 | Discharge: 2017-08-01 | Disposition: A | Discharge status: Home / Self Care | Payer: Medicare Other | Source: Ambulatory Visit | Attending: Hematology | Admitting: Hematology

## 2017-08-01 DIAGNOSIS — E119 Type 2 diabetes mellitus without complications: Secondary | ICD-10-CM | POA: Diagnosis not present

## 2017-08-01 DIAGNOSIS — C9200 Acute myeloblastic leukemia, not having achieved remission: Secondary | ICD-10-CM | POA: Diagnosis not present

## 2017-08-01 DIAGNOSIS — D469 Myelodysplastic syndrome, unspecified: Secondary | ICD-10-CM | POA: Diagnosis not present

## 2017-08-01 DIAGNOSIS — G720 Drug-induced myopathy: Secondary | ICD-10-CM | POA: Diagnosis not present

## 2017-08-01 DIAGNOSIS — K5790 Diverticulosis of intestine, part unspecified, without perforation or abscess without bleeding: Secondary | ICD-10-CM | POA: Diagnosis not present

## 2017-08-01 DIAGNOSIS — T380X5D Adverse effect of glucocorticoids and synthetic analogues, subsequent encounter: Secondary | ICD-10-CM | POA: Diagnosis not present

## 2017-08-01 NOTE — Telephone Encounter (Signed)
Following up on message to call center on Friday evening at [place]. Vincent in [facility] communicated with Dr. Burr Medico, on-call physician. Talked to Sherlynn Stalls in [place], who was here on Saturday. No issues noted.

## 2017-08-02 ENCOUNTER — Ambulatory Visit
Admission: RE | Admit: 2017-08-02 | Discharge: 2017-08-02 | Disposition: A | Discharge status: Home / Self Care | Payer: MEDICARE

## 2017-08-02 DIAGNOSIS — R600 Localized edema: Secondary | ICD-10-CM

## 2017-08-02 DIAGNOSIS — D469 Myelodysplastic syndrome, unspecified: Secondary | ICD-10-CM

## 2017-08-02 DIAGNOSIS — N179 Acute kidney failure, unspecified: Secondary | ICD-10-CM

## 2017-08-02 DIAGNOSIS — A0472 Enterocolitis due to Clostridium difficile, not specified as recurrent: Secondary | ICD-10-CM

## 2017-08-02 DIAGNOSIS — I10 Essential (primary) hypertension: Secondary | ICD-10-CM

## 2017-08-02 DIAGNOSIS — D89810 Acute graft-versus-host disease: Secondary | ICD-10-CM

## 2017-08-02 DIAGNOSIS — Z9484 Stem cells transplant status: Principal | ICD-10-CM

## 2017-08-02 DIAGNOSIS — D899 Disorder involving the immune mechanism, unspecified: Secondary | ICD-10-CM

## 2017-08-02 DIAGNOSIS — D46Z Other myelodysplastic syndromes: Principal | ICD-10-CM

## 2017-08-02 DIAGNOSIS — T380X5A Adverse effect of glucocorticoids and synthetic analogues, initial encounter: Secondary | ICD-10-CM

## 2017-08-02 DIAGNOSIS — C9200 Acute myeloblastic leukemia, not having achieved remission: Secondary | ICD-10-CM

## 2017-08-02 DIAGNOSIS — R739 Hyperglycemia, unspecified: Secondary | ICD-10-CM

## 2017-08-02 DIAGNOSIS — Z87891 Personal history of nicotine dependence: Secondary | ICD-10-CM | POA: Diagnosis not present

## 2017-08-02 DIAGNOSIS — D849 Immunodeficiency, unspecified: Secondary | ICD-10-CM | POA: Diagnosis not present

## 2017-08-02 DIAGNOSIS — R0602 Shortness of breath: Secondary | ICD-10-CM | POA: Diagnosis not present

## 2017-08-02 DIAGNOSIS — Z48298 Encounter for aftercare following other organ transplant: Secondary | ICD-10-CM | POA: Diagnosis not present

## 2017-08-02 DIAGNOSIS — Z6820 Body mass index (BMI) 20.0-20.9, adult: Secondary | ICD-10-CM | POA: Diagnosis not present

## 2017-08-02 DIAGNOSIS — Z9221 Personal history of antineoplastic chemotherapy: Secondary | ICD-10-CM | POA: Diagnosis not present

## 2017-08-02 DIAGNOSIS — Z9989 Dependence on other enabling machines and devices: Secondary | ICD-10-CM | POA: Diagnosis not present

## 2017-08-02 DIAGNOSIS — G4733 Obstructive sleep apnea (adult) (pediatric): Secondary | ICD-10-CM | POA: Diagnosis not present

## 2017-08-02 MED ORDER — PREDNISONE 5 MG TABLET
ORAL_TABLET | Freq: Every day | ORAL | 1 refills | 0 days
Start: 2017-08-02 — End: 2017-08-14

## 2017-08-04 DIAGNOSIS — T380X5D Adverse effect of glucocorticoids and synthetic analogues, subsequent encounter: Secondary | ICD-10-CM | POA: Diagnosis not present

## 2017-08-04 DIAGNOSIS — D469 Myelodysplastic syndrome, unspecified: Secondary | ICD-10-CM | POA: Diagnosis not present

## 2017-08-04 DIAGNOSIS — G720 Drug-induced myopathy: Secondary | ICD-10-CM | POA: Diagnosis not present

## 2017-08-04 DIAGNOSIS — K5790 Diverticulosis of intestine, part unspecified, without perforation or abscess without bleeding: Secondary | ICD-10-CM | POA: Diagnosis not present

## 2017-08-04 DIAGNOSIS — C9200 Acute myeloblastic leukemia, not having achieved remission: Secondary | ICD-10-CM | POA: Diagnosis not present

## 2017-08-04 DIAGNOSIS — E119 Type 2 diabetes mellitus without complications: Secondary | ICD-10-CM | POA: Diagnosis not present

## 2017-08-08 ENCOUNTER — Ambulatory Visit
Admission: RE | Admit: 2017-08-08 | Discharge: 2017-08-08 | Disposition: A | Discharge status: Home / Self Care | Payer: MEDICARE | Attending: Hematology & Oncology | Admitting: Hematology & Oncology

## 2017-08-08 ENCOUNTER — Ambulatory Visit
Admission: RE | Admit: 2017-08-08 | Discharge: 2017-08-08 | Disposition: A | Discharge status: Home / Self Care | Payer: MEDICARE

## 2017-08-08 DIAGNOSIS — C9200 Acute myeloblastic leukemia, not having achieved remission: Secondary | ICD-10-CM

## 2017-08-08 DIAGNOSIS — D899 Disorder involving the immune mechanism, unspecified: Secondary | ICD-10-CM

## 2017-08-08 DIAGNOSIS — Z7682 Awaiting organ transplant status: Secondary | ICD-10-CM

## 2017-08-08 DIAGNOSIS — D469 Myelodysplastic syndrome, unspecified: Principal | ICD-10-CM

## 2017-08-08 DIAGNOSIS — D46Z Other myelodysplastic syndromes: Secondary | ICD-10-CM

## 2017-08-08 DIAGNOSIS — Z9484 Stem cells transplant status: Secondary | ICD-10-CM

## 2017-08-08 DIAGNOSIS — D89810 Acute graft-versus-host disease: Secondary | ICD-10-CM

## 2017-08-08 DIAGNOSIS — A0472 Enterocolitis due to Clostridium difficile, not specified as recurrent: Principal | ICD-10-CM

## 2017-08-08 DIAGNOSIS — Z6820 Body mass index (BMI) 20.0-20.9, adult: Secondary | ICD-10-CM | POA: Diagnosis not present

## 2017-08-08 DIAGNOSIS — Z79891 Long term (current) use of opiate analgesic: Secondary | ICD-10-CM | POA: Diagnosis not present

## 2017-08-08 DIAGNOSIS — Z888 Allergy status to other drugs, medicaments and biological substances status: Secondary | ICD-10-CM | POA: Diagnosis not present

## 2017-08-08 DIAGNOSIS — R600 Localized edema: Secondary | ICD-10-CM | POA: Diagnosis not present

## 2017-08-08 DIAGNOSIS — Z7952 Long term (current) use of systemic steroids: Secondary | ICD-10-CM | POA: Diagnosis not present

## 2017-08-08 DIAGNOSIS — G4733 Obstructive sleep apnea (adult) (pediatric): Secondary | ICD-10-CM | POA: Diagnosis not present

## 2017-08-08 DIAGNOSIS — T865 Complications of stem cell transplant: Secondary | ICD-10-CM | POA: Diagnosis not present

## 2017-08-08 DIAGNOSIS — I10 Essential (primary) hypertension: Secondary | ICD-10-CM | POA: Diagnosis not present

## 2017-08-08 DIAGNOSIS — Z9225 Personal history of immunosupression therapy: Secondary | ICD-10-CM | POA: Diagnosis not present

## 2017-08-08 DIAGNOSIS — K219 Gastro-esophageal reflux disease without esophagitis: Secondary | ICD-10-CM | POA: Diagnosis not present

## 2017-08-08 DIAGNOSIS — Z9221 Personal history of antineoplastic chemotherapy: Secondary | ICD-10-CM | POA: Diagnosis not present

## 2017-08-08 DIAGNOSIS — Z9079 Acquired absence of other genital organ(s): Secondary | ICD-10-CM | POA: Diagnosis not present

## 2017-08-08 DIAGNOSIS — Z8619 Personal history of other infectious and parasitic diseases: Secondary | ICD-10-CM | POA: Diagnosis not present

## 2017-08-08 DIAGNOSIS — Z85820 Personal history of malignant melanoma of skin: Secondary | ICD-10-CM | POA: Diagnosis not present

## 2017-08-08 DIAGNOSIS — R21 Rash and other nonspecific skin eruption: Secondary | ICD-10-CM | POA: Diagnosis not present

## 2017-08-08 DIAGNOSIS — Z79899 Other long term (current) drug therapy: Secondary | ICD-10-CM | POA: Diagnosis not present

## 2017-08-08 DIAGNOSIS — Z87891 Personal history of nicotine dependence: Secondary | ICD-10-CM | POA: Diagnosis not present

## 2017-08-08 DIAGNOSIS — J45909 Unspecified asthma, uncomplicated: Secondary | ICD-10-CM | POA: Diagnosis not present

## 2017-08-08 DIAGNOSIS — Z8546 Personal history of malignant neoplasm of prostate: Secondary | ICD-10-CM | POA: Diagnosis not present

## 2017-08-08 DIAGNOSIS — Z794 Long term (current) use of insulin: Secondary | ICD-10-CM | POA: Diagnosis not present

## 2017-08-08 DIAGNOSIS — D849 Immunodeficiency, unspecified: Secondary | ICD-10-CM | POA: Diagnosis not present

## 2017-08-09 DIAGNOSIS — T380X5D Adverse effect of glucocorticoids and synthetic analogues, subsequent encounter: Secondary | ICD-10-CM | POA: Diagnosis not present

## 2017-08-09 DIAGNOSIS — E119 Type 2 diabetes mellitus without complications: Secondary | ICD-10-CM | POA: Diagnosis not present

## 2017-08-09 DIAGNOSIS — K5790 Diverticulosis of intestine, part unspecified, without perforation or abscess without bleeding: Secondary | ICD-10-CM | POA: Diagnosis not present

## 2017-08-09 DIAGNOSIS — C9200 Acute myeloblastic leukemia, not having achieved remission: Secondary | ICD-10-CM | POA: Diagnosis not present

## 2017-08-09 DIAGNOSIS — G720 Drug-induced myopathy: Secondary | ICD-10-CM | POA: Diagnosis not present

## 2017-08-09 DIAGNOSIS — D469 Myelodysplastic syndrome, unspecified: Secondary | ICD-10-CM | POA: Diagnosis not present

## 2017-08-11 DIAGNOSIS — T380X5D Adverse effect of glucocorticoids and synthetic analogues, subsequent encounter: Secondary | ICD-10-CM | POA: Diagnosis not present

## 2017-08-11 DIAGNOSIS — C9200 Acute myeloblastic leukemia, not having achieved remission: Secondary | ICD-10-CM | POA: Diagnosis not present

## 2017-08-11 DIAGNOSIS — K5790 Diverticulosis of intestine, part unspecified, without perforation or abscess without bleeding: Secondary | ICD-10-CM | POA: Diagnosis not present

## 2017-08-11 DIAGNOSIS — G720 Drug-induced myopathy: Secondary | ICD-10-CM | POA: Diagnosis not present

## 2017-08-11 DIAGNOSIS — E119 Type 2 diabetes mellitus without complications: Secondary | ICD-10-CM | POA: Diagnosis not present

## 2017-08-11 DIAGNOSIS — D469 Myelodysplastic syndrome, unspecified: Secondary | ICD-10-CM | POA: Diagnosis not present

## 2017-08-14 ENCOUNTER — Ambulatory Visit
Admission: RE | Admit: 2017-08-14 | Discharge: 2017-08-14 | Disposition: A | Discharge status: Home / Self Care | Payer: MEDICARE

## 2017-08-14 ENCOUNTER — Ambulatory Visit
Admission: RE | Admit: 2017-08-14 | Discharge: 2017-08-14 | Disposition: A | Discharge status: Home / Self Care | Attending: Hematology & Oncology

## 2017-08-14 DIAGNOSIS — Z9484 Stem cells transplant status: Secondary | ICD-10-CM

## 2017-08-14 DIAGNOSIS — C9200 Acute myeloblastic leukemia, not having achieved remission: Principal | ICD-10-CM

## 2017-08-14 DIAGNOSIS — D469 Myelodysplastic syndrome, unspecified: Principal | ICD-10-CM

## 2017-08-14 DIAGNOSIS — A0471 Enterocolitis due to Clostridium difficile, recurrent: Secondary | ICD-10-CM

## 2017-08-14 DIAGNOSIS — A498 Other bacterial infections of unspecified site: Principal | ICD-10-CM

## 2017-08-14 DIAGNOSIS — I10 Essential (primary) hypertension: Secondary | ICD-10-CM | POA: Diagnosis not present

## 2017-08-14 DIAGNOSIS — J9811 Atelectasis: Secondary | ICD-10-CM | POA: Diagnosis not present

## 2017-08-14 DIAGNOSIS — R21 Rash and other nonspecific skin eruption: Secondary | ICD-10-CM | POA: Diagnosis not present

## 2017-08-14 DIAGNOSIS — B9689 Other specified bacterial agents as the cause of diseases classified elsewhere: Secondary | ICD-10-CM | POA: Diagnosis not present

## 2017-08-14 DIAGNOSIS — N179 Acute kidney failure, unspecified: Secondary | ICD-10-CM | POA: Diagnosis not present

## 2017-08-14 DIAGNOSIS — M7989 Other specified soft tissue disorders: Secondary | ICD-10-CM | POA: Diagnosis not present

## 2017-08-14 DIAGNOSIS — Z6820 Body mass index (BMI) 20.0-20.9, adult: Secondary | ICD-10-CM | POA: Diagnosis not present

## 2017-08-14 DIAGNOSIS — K219 Gastro-esophageal reflux disease without esophagitis: Secondary | ICD-10-CM | POA: Diagnosis not present

## 2017-08-14 DIAGNOSIS — R141 Gas pain: Secondary | ICD-10-CM | POA: Diagnosis not present

## 2017-08-14 DIAGNOSIS — R600 Localized edema: Secondary | ICD-10-CM | POA: Diagnosis not present

## 2017-08-14 DIAGNOSIS — R739 Hyperglycemia, unspecified: Secondary | ICD-10-CM | POA: Diagnosis not present

## 2017-08-14 MED ORDER — PREDNISONE 5 MG TABLET
ORAL_TABLET | Freq: Every day | ORAL | 1 refills | 0 days
Start: 2017-08-14 — End: 2017-08-21

## 2017-08-15 ENCOUNTER — Telehealth: Payer: Self-pay | Admitting: *Deleted

## 2017-08-15 DIAGNOSIS — T380X5D Adverse effect of glucocorticoids and synthetic analogues, subsequent encounter: Secondary | ICD-10-CM | POA: Diagnosis not present

## 2017-08-15 DIAGNOSIS — E119 Type 2 diabetes mellitus without complications: Secondary | ICD-10-CM | POA: Diagnosis not present

## 2017-08-15 DIAGNOSIS — G720 Drug-induced myopathy: Secondary | ICD-10-CM | POA: Diagnosis not present

## 2017-08-15 DIAGNOSIS — K5790 Diverticulosis of intestine, part unspecified, without perforation or abscess without bleeding: Secondary | ICD-10-CM | POA: Diagnosis not present

## 2017-08-15 DIAGNOSIS — C9200 Acute myeloblastic leukemia, not having achieved remission: Secondary | ICD-10-CM | POA: Diagnosis not present

## 2017-08-15 DIAGNOSIS — D469 Myelodysplastic syndrome, unspecified: Secondary | ICD-10-CM | POA: Diagnosis not present

## 2017-08-15 NOTE — Telephone Encounter (Signed)
Pt called stating he would like to cancel upcoming apts due to frequent apts at [facility].  Pt will update our office and let us know when he is available to be rescheduled.

## 2017-08-17 MED ORDER — VANCOMYCIN 125 MG CAPSULE
ORAL_CAPSULE | ORAL | 1 refills | 0 days | Status: CP
Start: 2017-08-17 — End: 2017-08-27

## 2017-08-21 ENCOUNTER — Ambulatory Visit
Admission: RE | Admit: 2017-08-21 | Discharge: 2017-08-21 | Disposition: A | Discharge status: Home / Self Care | Payer: MEDICARE

## 2017-08-21 ENCOUNTER — Ambulatory Visit: Admission: RE | Admit: 2017-08-21 | Discharge: 2017-08-21 | Disposition: A | Discharge status: Home / Self Care

## 2017-08-21 DIAGNOSIS — D469 Myelodysplastic syndrome, unspecified: Principal | ICD-10-CM

## 2017-08-21 DIAGNOSIS — Z9484 Stem cells transplant status: Secondary | ICD-10-CM

## 2017-08-21 DIAGNOSIS — C9200 Acute myeloblastic leukemia, not having achieved remission: Secondary | ICD-10-CM

## 2017-08-21 DIAGNOSIS — Z136 Encounter for screening for cardiovascular disorders: Secondary | ICD-10-CM

## 2017-08-21 DIAGNOSIS — H578 Other specified disorders of eye and adnexa: Secondary | ICD-10-CM | POA: Diagnosis not present

## 2017-08-21 DIAGNOSIS — R600 Localized edema: Secondary | ICD-10-CM | POA: Diagnosis not present

## 2017-08-21 DIAGNOSIS — Z6821 Body mass index (BMI) 21.0-21.9, adult: Secondary | ICD-10-CM | POA: Diagnosis not present

## 2017-08-21 DIAGNOSIS — I10 Essential (primary) hypertension: Secondary | ICD-10-CM | POA: Diagnosis not present

## 2017-08-21 DIAGNOSIS — N179 Acute kidney failure, unspecified: Secondary | ICD-10-CM | POA: Diagnosis not present

## 2017-08-21 DIAGNOSIS — Z8546 Personal history of malignant neoplasm of prostate: Secondary | ICD-10-CM | POA: Diagnosis not present

## 2017-08-21 DIAGNOSIS — Z888 Allergy status to other drugs, medicaments and biological substances status: Secondary | ICD-10-CM | POA: Diagnosis not present

## 2017-08-21 DIAGNOSIS — Z9079 Acquired absence of other genital organ(s): Secondary | ICD-10-CM | POA: Diagnosis not present

## 2017-08-21 DIAGNOSIS — K219 Gastro-esophageal reflux disease without esophagitis: Secondary | ICD-10-CM | POA: Diagnosis not present

## 2017-08-21 DIAGNOSIS — Z79899 Other long term (current) drug therapy: Secondary | ICD-10-CM | POA: Diagnosis not present

## 2017-08-21 DIAGNOSIS — Z8719 Personal history of other diseases of the digestive system: Secondary | ICD-10-CM | POA: Diagnosis not present

## 2017-08-21 DIAGNOSIS — Z7952 Long term (current) use of systemic steroids: Secondary | ICD-10-CM | POA: Diagnosis not present

## 2017-08-21 DIAGNOSIS — Z85820 Personal history of malignant melanoma of skin: Secondary | ICD-10-CM | POA: Diagnosis not present

## 2017-08-21 DIAGNOSIS — R21 Rash and other nonspecific skin eruption: Secondary | ICD-10-CM | POA: Diagnosis not present

## 2017-08-21 MED ORDER — PREDNISONE 5 MG TABLET
ORAL_TABLET | Freq: Every day | ORAL | 1 refills | 0 days
Start: 2017-08-21 — End: 2017-09-05

## 2017-08-23 ENCOUNTER — Ambulatory Visit (INDEPENDENT_AMBULATORY_CARE_PROVIDER_SITE_OTHER)
Admission: RE | Admit: 2017-08-23 | Discharge: 2017-08-23 | Disposition: A | Discharge status: Home / Self Care | Payer: Medicare Other | Source: Ambulatory Visit | Attending: Pulmonary Disease | Admitting: Pulmonary Disease

## 2017-08-23 ENCOUNTER — Encounter: Payer: Self-pay | Admitting: Pulmonary Disease

## 2017-08-23 ENCOUNTER — Ambulatory Visit (INDEPENDENT_AMBULATORY_CARE_PROVIDER_SITE_OTHER): Payer: Medicare Other | Admitting: Pulmonary Disease

## 2017-08-23 VITALS — BP 126/74 | HR 68 | Ht 67.0 in | Wt 133.0 lb

## 2017-08-23 DIAGNOSIS — J449 Chronic obstructive pulmonary disease, unspecified: Secondary | ICD-10-CM

## 2017-08-23 DIAGNOSIS — D469 Myelodysplastic syndrome, unspecified: Secondary | ICD-10-CM | POA: Diagnosis not present

## 2017-08-23 DIAGNOSIS — J90 Pleural effusion, not elsewhere classified: Secondary | ICD-10-CM | POA: Diagnosis not present

## 2017-08-23 DIAGNOSIS — K5790 Diverticulosis of intestine, part unspecified, without perforation or abscess without bleeding: Secondary | ICD-10-CM | POA: Diagnosis not present

## 2017-08-23 DIAGNOSIS — T380X5D Adverse effect of glucocorticoids and synthetic analogues, subsequent encounter: Secondary | ICD-10-CM | POA: Diagnosis not present

## 2017-08-23 DIAGNOSIS — E119 Type 2 diabetes mellitus without complications: Secondary | ICD-10-CM | POA: Diagnosis not present

## 2017-08-23 DIAGNOSIS — C9200 Acute myeloblastic leukemia, not having achieved remission: Secondary | ICD-10-CM | POA: Diagnosis not present

## 2017-08-23 DIAGNOSIS — I700 Atherosclerosis of aorta: Secondary | ICD-10-CM | POA: Diagnosis not present

## 2017-08-23 DIAGNOSIS — Z0000 Encounter for general adult medical examination without abnormal findings: Secondary | ICD-10-CM | POA: Diagnosis not present

## 2017-08-23 DIAGNOSIS — G720 Drug-induced myopathy: Secondary | ICD-10-CM | POA: Diagnosis not present
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
## 2017-08-23 NOTE — Patient Instructions (Signed)
For pleural effusions: We will order a chest x-ray to evaluate further  For your history of mild COPD: When okay by your transplant physicians, please get a flu shot Let us know if you have any sort of wheezing or shortness of breath  We will see you back in 4-6 months or sooner if needed

## 2017-08-25 ENCOUNTER — Other Ambulatory Visit: Payer: Medicare Other

## 2017-08-25 ENCOUNTER — Ambulatory Visit: Payer: Medicare Other | Admitting: Hematology

## 2017-08-25 DIAGNOSIS — E119 Type 2 diabetes mellitus without complications: Secondary | ICD-10-CM | POA: Diagnosis not present

## 2017-08-25 DIAGNOSIS — G720 Drug-induced myopathy: Secondary | ICD-10-CM | POA: Diagnosis not present

## 2017-08-25 DIAGNOSIS — C9200 Acute myeloblastic leukemia, not having achieved remission: Secondary | ICD-10-CM | POA: Diagnosis not present

## 2017-08-25 DIAGNOSIS — T380X5D Adverse effect of glucocorticoids and synthetic analogues, subsequent encounter: Secondary | ICD-10-CM | POA: Diagnosis not present

## 2017-08-25 DIAGNOSIS — K5790 Diverticulosis of intestine, part unspecified, without perforation or abscess without bleeding: Secondary | ICD-10-CM | POA: Diagnosis not present

## 2017-08-25 DIAGNOSIS — D469 Myelodysplastic syndrome, unspecified: Secondary | ICD-10-CM | POA: Diagnosis not present

## 2017-08-29 ENCOUNTER — Ambulatory Visit
Admission: RE | Admit: 2017-08-29 | Discharge: 2017-08-29 | Disposition: A | Discharge status: Home / Self Care | Payer: MEDICARE | Attending: Hematology & Oncology | Admitting: Hematology & Oncology

## 2017-08-29 ENCOUNTER — Ambulatory Visit: Admission: RE | Admit: 2017-08-29 | Discharge: 2017-08-29 | Disposition: A | Discharge status: Home / Self Care

## 2017-08-29 ENCOUNTER — Ambulatory Visit
Admission: RE | Admit: 2017-08-29 | Discharge: 2017-08-29 | Disposition: A | Discharge status: Home / Self Care | Payer: MEDICARE

## 2017-08-29 DIAGNOSIS — Z1589 Genetic susceptibility to other disease: Secondary | ICD-10-CM

## 2017-08-29 DIAGNOSIS — D89810 Acute graft-versus-host disease: Secondary | ICD-10-CM

## 2017-08-29 DIAGNOSIS — Z87898 Personal history of other specified conditions: Secondary | ICD-10-CM

## 2017-08-29 DIAGNOSIS — D46Z Other myelodysplastic syndromes: Principal | ICD-10-CM

## 2017-08-29 DIAGNOSIS — R972 Elevated prostate specific antigen [PSA]: Secondary | ICD-10-CM

## 2017-08-29 DIAGNOSIS — Z9484 Stem cells transplant status: Secondary | ICD-10-CM

## 2017-08-29 DIAGNOSIS — Z7682 Awaiting organ transplant status: Secondary | ICD-10-CM

## 2017-08-29 DIAGNOSIS — M8580 Other specified disorders of bone density and structure, unspecified site: Secondary | ICD-10-CM

## 2017-08-29 DIAGNOSIS — D469 Myelodysplastic syndrome, unspecified: Secondary | ICD-10-CM

## 2017-08-29 DIAGNOSIS — D899 Disorder involving the immune mechanism, unspecified: Secondary | ICD-10-CM

## 2017-08-29 DIAGNOSIS — I10 Essential (primary) hypertension: Principal | ICD-10-CM

## 2017-08-29 DIAGNOSIS — A0472 Enterocolitis due to Clostridium difficile, not specified as recurrent: Secondary | ICD-10-CM

## 2017-08-29 DIAGNOSIS — C9200 Acute myeloblastic leukemia, not having achieved remission: Secondary | ICD-10-CM

## 2017-08-29 DIAGNOSIS — Z8546 Personal history of malignant neoplasm of prostate: Secondary | ICD-10-CM | POA: Diagnosis not present

## 2017-08-29 DIAGNOSIS — D89813 Graft-versus-host disease, unspecified: Secondary | ICD-10-CM | POA: Diagnosis not present

## 2017-08-29 DIAGNOSIS — T380X5D Adverse effect of glucocorticoids and synthetic analogues, subsequent encounter: Secondary | ICD-10-CM | POA: Diagnosis not present

## 2017-08-29 DIAGNOSIS — Z9079 Acquired absence of other genital organ(s): Secondary | ICD-10-CM | POA: Diagnosis not present

## 2017-08-29 DIAGNOSIS — R52 Pain, unspecified: Secondary | ICD-10-CM | POA: Diagnosis not present

## 2017-08-29 DIAGNOSIS — D849 Immunodeficiency, unspecified: Secondary | ICD-10-CM | POA: Diagnosis not present

## 2017-08-29 DIAGNOSIS — R21 Rash and other nonspecific skin eruption: Secondary | ICD-10-CM | POA: Diagnosis not present

## 2017-08-29 DIAGNOSIS — R1903 Right lower quadrant abdominal swelling, mass and lump: Secondary | ICD-10-CM | POA: Diagnosis not present

## 2017-08-29 DIAGNOSIS — N179 Acute kidney failure, unspecified: Secondary | ICD-10-CM | POA: Diagnosis not present

## 2017-08-29 DIAGNOSIS — Z8619 Personal history of other infectious and parasitic diseases: Secondary | ICD-10-CM | POA: Diagnosis not present

## 2017-08-29 DIAGNOSIS — Z7952 Long term (current) use of systemic steroids: Secondary | ICD-10-CM | POA: Diagnosis not present

## 2017-08-29 DIAGNOSIS — R600 Localized edema: Secondary | ICD-10-CM | POA: Diagnosis not present

## 2017-08-29 DIAGNOSIS — R739 Hyperglycemia, unspecified: Secondary | ICD-10-CM | POA: Diagnosis not present

## 2017-08-29 DIAGNOSIS — Z85820 Personal history of malignant melanoma of skin: Secondary | ICD-10-CM | POA: Diagnosis not present

## 2017-08-29 DIAGNOSIS — T865 Complications of stem cell transplant: Secondary | ICD-10-CM | POA: Diagnosis not present

## 2017-08-29 DIAGNOSIS — Z79891 Long term (current) use of opiate analgesic: Secondary | ICD-10-CM | POA: Diagnosis not present

## 2017-08-29 DIAGNOSIS — Z8719 Personal history of other diseases of the digestive system: Secondary | ICD-10-CM | POA: Diagnosis not present

## 2017-08-30 ENCOUNTER — Ambulatory Visit: Admission: RE | Admit: 2017-08-30 | Discharge: 2017-08-30 | Payer: MEDICARE

## 2017-08-30 DIAGNOSIS — C9200 Acute myeloblastic leukemia, not having achieved remission: Principal | ICD-10-CM

## 2017-08-30 DIAGNOSIS — H11823 Conjunctivochalasis, bilateral: Secondary | ICD-10-CM

## 2017-08-30 DIAGNOSIS — H3589 Other specified retinal disorders: Secondary | ICD-10-CM | POA: Diagnosis not present

## 2017-08-30 DIAGNOSIS — H2513 Age-related nuclear cataract, bilateral: Secondary | ICD-10-CM | POA: Diagnosis not present

## 2017-08-30 DIAGNOSIS — H04123 Dry eye syndrome of bilateral lacrimal glands: Secondary | ICD-10-CM | POA: Diagnosis not present

## 2017-08-31 ENCOUNTER — Telehealth: Payer: Self-pay | Admitting: Pulmonary Disease

## 2017-08-31 NOTE — Telephone Encounter (Signed)
ATC pt, no answer. Left message for pt to call back.  

## 2017-08-31 NOTE — Telephone Encounter (Signed)
Spoke with pt, and advised CXR report would be faxed to [facility]. Nothing further is needed.

## 2017-09-04 DIAGNOSIS — L821 Other seborrheic keratosis: Secondary | ICD-10-CM | POA: Diagnosis not present

## 2017-09-04 DIAGNOSIS — L308 Other specified dermatitis: Secondary | ICD-10-CM | POA: Diagnosis not present

## 2017-09-04 DIAGNOSIS — L218 Other seborrheic dermatitis: Secondary | ICD-10-CM | POA: Diagnosis not present

## 2017-09-04 DIAGNOSIS — D692 Other nonthrombocytopenic purpura: Secondary | ICD-10-CM | POA: Diagnosis not present

## 2017-09-04 DIAGNOSIS — D89811 Chronic graft-versus-host disease: Secondary | ICD-10-CM | POA: Diagnosis not present

## 2017-09-04 DIAGNOSIS — L309 Dermatitis, unspecified: Secondary | ICD-10-CM | POA: Diagnosis not present

## 2017-09-05 ENCOUNTER — Ambulatory Visit
Admission: RE | Admit: 2017-09-05 | Discharge: 2017-09-05 | Disposition: A | Discharge status: Home / Self Care | Payer: MEDICARE | Attending: Pharmacist Clinician (PhC)/ Clinical Pharmacy Specialist | Admitting: Pharmacist Clinician (PhC)/ Clinical Pharmacy Specialist

## 2017-09-05 ENCOUNTER — Ambulatory Visit
Admission: RE | Admit: 2017-09-05 | Discharge: 2017-09-05 | Disposition: A | Discharge status: Home / Self Care | Payer: MEDICARE

## 2017-09-05 ENCOUNTER — Ambulatory Visit
Admission: RE | Admit: 2017-09-05 | Discharge: 2017-09-05 | Disposition: A | Discharge status: Home / Self Care | Payer: MEDICARE | Attending: Nurse Practitioner

## 2017-09-05 DIAGNOSIS — Z23 Encounter for immunization: Secondary | ICD-10-CM

## 2017-09-05 DIAGNOSIS — I10 Essential (primary) hypertension: Secondary | ICD-10-CM

## 2017-09-05 DIAGNOSIS — C9200 Acute myeloblastic leukemia, not having achieved remission: Secondary | ICD-10-CM

## 2017-09-05 DIAGNOSIS — D89810 Acute graft-versus-host disease: Secondary | ICD-10-CM

## 2017-09-05 DIAGNOSIS — Z9484 Stem cells transplant status: Principal | ICD-10-CM

## 2017-09-05 DIAGNOSIS — Z125 Encounter for screening for malignant neoplasm of prostate: Principal | ICD-10-CM

## 2017-09-05 DIAGNOSIS — D469 Myelodysplastic syndrome, unspecified: Secondary | ICD-10-CM

## 2017-09-05 DIAGNOSIS — Z136 Encounter for screening for cardiovascular disorders: Secondary | ICD-10-CM

## 2017-09-05 DIAGNOSIS — A0471 Enterocolitis due to Clostridium difficile, recurrent: Secondary | ICD-10-CM | POA: Diagnosis not present

## 2017-09-05 DIAGNOSIS — Z6821 Body mass index (BMI) 21.0-21.9, adult: Secondary | ICD-10-CM | POA: Diagnosis not present

## 2017-09-05 DIAGNOSIS — Z7952 Long term (current) use of systemic steroids: Secondary | ICD-10-CM | POA: Diagnosis not present

## 2017-09-05 DIAGNOSIS — Z48298 Encounter for aftercare following other organ transplant: Secondary | ICD-10-CM | POA: Diagnosis not present

## 2017-09-05 MED ORDER — OMEGA-3 ACID ETHYL ESTERS 1 GRAM CAPSULE
ORAL_CAPSULE | Freq: Every day | ORAL | 1 refills | 0 days | Status: CP
Start: 2017-09-05 — End: 2017-09-22

## 2017-09-05 MED ORDER — BACLOFEN 10 MG TABLET
ORAL_TABLET | Freq: Two times a day (BID) | ORAL | 0 refills | 0 days | Status: CP | PRN
Start: 2017-09-05 — End: 2017-09-22

## 2017-09-05 MED ORDER — PREDNISONE 5 MG TABLET
ORAL_TABLET | Freq: Every day | ORAL | 1 refills | 0 days
Start: 2017-09-05 — End: 2017-09-12

## 2017-09-06 DIAGNOSIS — D89811 Chronic graft-versus-host disease: Secondary | ICD-10-CM | POA: Diagnosis not present

## 2017-09-07 DIAGNOSIS — M13141 Monoarthritis, not elsewhere classified, right hand: Secondary | ICD-10-CM | POA: Diagnosis not present

## 2017-09-07 DIAGNOSIS — M79641 Pain in right hand: Secondary | ICD-10-CM | POA: Diagnosis not present

## 2017-09-08 ENCOUNTER — Ambulatory Visit
Admission: RE | Admit: 2017-09-08 | Discharge: 2017-09-08 | Disposition: A | Discharge status: Home / Self Care | Payer: MEDICARE

## 2017-09-08 DIAGNOSIS — Z9484 Stem cells transplant status: Principal | ICD-10-CM

## 2017-09-08 DIAGNOSIS — M109 Gout, unspecified: Principal | ICD-10-CM

## 2017-09-08 DIAGNOSIS — R609 Edema, unspecified: Secondary | ICD-10-CM | POA: Diagnosis not present

## 2017-09-08 DIAGNOSIS — Z48290 Encounter for aftercare following bone marrow transplant: Secondary | ICD-10-CM | POA: Diagnosis not present

## 2017-09-08 DIAGNOSIS — Z6821 Body mass index (BMI) 21.0-21.9, adult: Secondary | ICD-10-CM | POA: Diagnosis not present

## 2017-09-08 DIAGNOSIS — A0471 Enterocolitis due to Clostridium difficile, recurrent: Secondary | ICD-10-CM | POA: Diagnosis not present

## 2017-09-08 DIAGNOSIS — Z79899 Other long term (current) drug therapy: Secondary | ICD-10-CM | POA: Diagnosis not present

## 2017-09-08 DIAGNOSIS — Z8546 Personal history of malignant neoplasm of prostate: Secondary | ICD-10-CM | POA: Diagnosis not present

## 2017-09-08 DIAGNOSIS — C9200 Acute myeloblastic leukemia, not having achieved remission: Secondary | ICD-10-CM | POA: Diagnosis not present

## 2017-09-08 DIAGNOSIS — L539 Erythematous condition, unspecified: Secondary | ICD-10-CM | POA: Diagnosis not present

## 2017-09-11 ENCOUNTER — Ambulatory Visit
Admission: RE | Admit: 2017-09-11 | Discharge: 2017-09-11 | Disposition: A | Discharge status: Home / Self Care | Payer: MEDICARE | Attending: Anesthesiology | Admitting: Anesthesiology

## 2017-09-11 ENCOUNTER — Ambulatory Visit
Admission: RE | Admit: 2017-09-11 | Discharge: 2017-09-11 | Disposition: A | Discharge status: Home / Self Care | Payer: MEDICARE | Admitting: Student in an Organized Health Care Education/Training Program

## 2017-09-11 DIAGNOSIS — K529 Noninfective gastroenteritis and colitis, unspecified: Principal | ICD-10-CM

## 2017-09-11 DIAGNOSIS — M1990 Unspecified osteoarthritis, unspecified site: Secondary | ICD-10-CM | POA: Diagnosis not present

## 2017-09-11 DIAGNOSIS — D123 Benign neoplasm of transverse colon: Secondary | ICD-10-CM | POA: Diagnosis not present

## 2017-09-11 DIAGNOSIS — G4733 Obstructive sleep apnea (adult) (pediatric): Secondary | ICD-10-CM | POA: Diagnosis not present

## 2017-09-11 DIAGNOSIS — K5140 Inflammatory polyps of colon without complications: Secondary | ICD-10-CM | POA: Diagnosis not present

## 2017-09-11 DIAGNOSIS — J449 Chronic obstructive pulmonary disease, unspecified: Secondary | ICD-10-CM | POA: Diagnosis not present

## 2017-09-11 DIAGNOSIS — D122 Benign neoplasm of ascending colon: Secondary | ICD-10-CM | POA: Diagnosis not present

## 2017-09-11 DIAGNOSIS — K635 Polyp of colon: Secondary | ICD-10-CM | POA: Diagnosis not present

## 2017-09-11 DIAGNOSIS — I10 Essential (primary) hypertension: Secondary | ICD-10-CM | POA: Diagnosis not present

## 2017-09-11 DIAGNOSIS — I313 Pericardial effusion (noninflammatory): Secondary | ICD-10-CM | POA: Diagnosis not present

## 2017-09-11 DIAGNOSIS — A0471 Enterocolitis due to Clostridium difficile, recurrent: Secondary | ICD-10-CM | POA: Diagnosis not present

## 2017-09-11 DIAGNOSIS — Z87891 Personal history of nicotine dependence: Secondary | ICD-10-CM | POA: Diagnosis not present

## 2017-09-12 ENCOUNTER — Ambulatory Visit
Admission: RE | Admit: 2017-09-12 | Discharge: 2017-09-12 | Disposition: A | Discharge status: Home / Self Care | Payer: MEDICARE | Attending: Pharmacist Clinician (PhC)/ Clinical Pharmacy Specialist | Admitting: Pharmacist Clinician (PhC)/ Clinical Pharmacy Specialist

## 2017-09-12 ENCOUNTER — Ambulatory Visit
Admission: RE | Admit: 2017-09-12 | Discharge: 2017-09-12 | Disposition: A | Discharge status: Home / Self Care | Payer: MEDICARE

## 2017-09-12 ENCOUNTER — Ambulatory Visit
Admission: RE | Admit: 2017-09-12 | Discharge: 2017-09-12 | Disposition: A | Discharge status: Home / Self Care | Payer: MEDICARE | Attending: Medical

## 2017-09-12 DIAGNOSIS — C9200 Acute myeloblastic leukemia, not having achieved remission: Secondary | ICD-10-CM

## 2017-09-12 DIAGNOSIS — E860 Dehydration: Secondary | ICD-10-CM

## 2017-09-12 DIAGNOSIS — D469 Myelodysplastic syndrome, unspecified: Principal | ICD-10-CM

## 2017-09-12 DIAGNOSIS — Z9484 Stem cells transplant status: Secondary | ICD-10-CM

## 2017-09-12 DIAGNOSIS — D46Z Other myelodysplastic syndromes: Secondary | ICD-10-CM

## 2017-09-12 DIAGNOSIS — K30 Functional dyspepsia: Principal | ICD-10-CM

## 2017-09-12 DIAGNOSIS — R600 Localized edema: Secondary | ICD-10-CM | POA: Diagnosis not present

## 2017-09-12 DIAGNOSIS — Z85820 Personal history of malignant melanoma of skin: Secondary | ICD-10-CM | POA: Diagnosis not present

## 2017-09-12 DIAGNOSIS — M542 Cervicalgia: Secondary | ICD-10-CM | POA: Diagnosis not present

## 2017-09-12 DIAGNOSIS — C92A1 Acute myeloid leukemia with multilineage dysplasia, in remission: Secondary | ICD-10-CM | POA: Diagnosis not present

## 2017-09-12 DIAGNOSIS — M62838 Other muscle spasm: Secondary | ICD-10-CM | POA: Diagnosis not present

## 2017-09-12 DIAGNOSIS — A0472 Enterocolitis due to Clostridium difficile, not specified as recurrent: Secondary | ICD-10-CM | POA: Diagnosis not present

## 2017-09-12 DIAGNOSIS — Z79891 Long term (current) use of opiate analgesic: Secondary | ICD-10-CM | POA: Diagnosis not present

## 2017-09-12 DIAGNOSIS — Z6821 Body mass index (BMI) 21.0-21.9, adult: Secondary | ICD-10-CM | POA: Diagnosis not present

## 2017-09-12 DIAGNOSIS — Z888 Allergy status to other drugs, medicaments and biological substances status: Secondary | ICD-10-CM | POA: Diagnosis not present

## 2017-09-12 DIAGNOSIS — R110 Nausea: Secondary | ICD-10-CM | POA: Diagnosis not present

## 2017-09-12 DIAGNOSIS — Z79899 Other long term (current) drug therapy: Secondary | ICD-10-CM | POA: Diagnosis not present

## 2017-09-12 DIAGNOSIS — I10 Essential (primary) hypertension: Secondary | ICD-10-CM | POA: Diagnosis not present

## 2017-09-12 DIAGNOSIS — A0471 Enterocolitis due to Clostridium difficile, recurrent: Secondary | ICD-10-CM | POA: Diagnosis not present

## 2017-09-12 DIAGNOSIS — Z8546 Personal history of malignant neoplasm of prostate: Secondary | ICD-10-CM | POA: Diagnosis not present

## 2017-09-12 DIAGNOSIS — Z7952 Long term (current) use of systemic steroids: Secondary | ICD-10-CM | POA: Diagnosis not present

## 2017-09-12 MED ORDER — PREDNISONE 20 MG TABLET
0 days
Start: 2017-09-12 — End: 2017-10-11

## 2017-09-12 MED ORDER — CARBOXYMETHYLCELLULOSE SODIUM 0.25 % EYE DROPS
Freq: Four times a day (QID) | OPHTHALMIC | 0 days
Start: 2017-09-12 — End: ?

## 2017-09-12 MED ORDER — OXYCODONE 5 MG TABLET
ORAL_TABLET | ORAL | 0 refills | 0 days | Status: CP | PRN
Start: 2017-09-12 — End: 2017-10-05

## 2017-09-12 MED ORDER — TACROLIMUS 0.5 MG CAPSULE
ORAL_CAPSULE | 3 refills | 0 days | Status: CP
Start: 2017-09-12 — End: 2017-09-27

## 2017-09-12 MED ORDER — FAMOTIDINE 20 MG TABLET
ORAL_TABLET | Freq: Every day | ORAL | 1 refills | 0.00000 days
Start: 2017-09-12 — End: 2018-09-12

## 2017-09-13 DIAGNOSIS — D89811 Chronic graft-versus-host disease: Secondary | ICD-10-CM | POA: Diagnosis not present

## 2017-09-15 DIAGNOSIS — K5790 Diverticulosis of intestine, part unspecified, without perforation or abscess without bleeding: Secondary | ICD-10-CM | POA: Diagnosis not present

## 2017-09-15 DIAGNOSIS — G720 Drug-induced myopathy: Secondary | ICD-10-CM | POA: Diagnosis not present

## 2017-09-15 DIAGNOSIS — D469 Myelodysplastic syndrome, unspecified: Secondary | ICD-10-CM | POA: Diagnosis not present

## 2017-09-15 DIAGNOSIS — E119 Type 2 diabetes mellitus without complications: Secondary | ICD-10-CM | POA: Diagnosis not present

## 2017-09-15 DIAGNOSIS — C9200 Acute myeloblastic leukemia, not having achieved remission: Secondary | ICD-10-CM | POA: Diagnosis not present

## 2017-09-15 DIAGNOSIS — T380X5D Adverse effect of glucocorticoids and synthetic analogues, subsequent encounter: Secondary | ICD-10-CM | POA: Diagnosis not present

## 2017-09-15 DIAGNOSIS — D89811 Chronic graft-versus-host disease: Secondary | ICD-10-CM | POA: Diagnosis not present

## 2017-09-18 DIAGNOSIS — D89811 Chronic graft-versus-host disease: Secondary | ICD-10-CM | POA: Diagnosis not present

## 2017-09-19 ENCOUNTER — Ambulatory Visit
Admission: RE | Admit: 2017-09-19 | Discharge: 2017-09-19 | Disposition: A | Discharge status: Home / Self Care | Payer: MEDICARE

## 2017-09-19 DIAGNOSIS — Z9484 Stem cells transplant status: Principal | ICD-10-CM

## 2017-09-19 DIAGNOSIS — C9201 Acute myeloblastic leukemia, in remission: Principal | ICD-10-CM

## 2017-09-19 DIAGNOSIS — C9200 Acute myeloblastic leukemia, not having achieved remission: Secondary | ICD-10-CM | POA: Diagnosis not present

## 2017-09-19 DIAGNOSIS — I10 Essential (primary) hypertension: Secondary | ICD-10-CM | POA: Diagnosis not present

## 2017-09-19 DIAGNOSIS — Z6821 Body mass index (BMI) 21.0-21.9, adult: Secondary | ICD-10-CM | POA: Diagnosis not present

## 2017-09-19 DIAGNOSIS — Z79899 Other long term (current) drug therapy: Secondary | ICD-10-CM | POA: Diagnosis not present

## 2017-09-20 DIAGNOSIS — D89811 Chronic graft-versus-host disease: Secondary | ICD-10-CM | POA: Diagnosis not present

## 2017-09-22 ENCOUNTER — Ambulatory Visit
Admission: RE | Admit: 2017-09-22 | Discharge: 2017-09-22 | Disposition: A | Discharge status: Home / Self Care | Payer: MEDICARE

## 2017-09-22 ENCOUNTER — Ambulatory Visit
Admission: RE | Admit: 2017-09-22 | Discharge: 2017-09-22 | Disposition: A | Discharge status: Home / Self Care | Payer: MEDICARE | Attending: Hematology & Oncology | Admitting: Hematology & Oncology

## 2017-09-22 DIAGNOSIS — D89810 Acute graft-versus-host disease: Secondary | ICD-10-CM

## 2017-09-22 DIAGNOSIS — T380X5A Adverse effect of glucocorticoids and synthetic analogues, initial encounter: Secondary | ICD-10-CM

## 2017-09-22 DIAGNOSIS — R739 Hyperglycemia, unspecified: Secondary | ICD-10-CM

## 2017-09-22 DIAGNOSIS — A0472 Enterocolitis due to Clostridium difficile, not specified as recurrent: Secondary | ICD-10-CM

## 2017-09-22 DIAGNOSIS — C9200 Acute myeloblastic leukemia, not having achieved remission: Secondary | ICD-10-CM

## 2017-09-22 DIAGNOSIS — I10 Essential (primary) hypertension: Principal | ICD-10-CM

## 2017-09-22 DIAGNOSIS — Z9484 Stem cells transplant status: Principal | ICD-10-CM

## 2017-09-22 DIAGNOSIS — Z7952 Long term (current) use of systemic steroids: Secondary | ICD-10-CM | POA: Diagnosis not present

## 2017-09-22 DIAGNOSIS — T865 Complications of stem cell transplant: Secondary | ICD-10-CM | POA: Diagnosis not present

## 2017-09-22 DIAGNOSIS — M436 Torticollis: Secondary | ICD-10-CM | POA: Diagnosis not present

## 2017-09-22 DIAGNOSIS — R600 Localized edema: Secondary | ICD-10-CM | POA: Diagnosis not present

## 2017-09-22 DIAGNOSIS — Z9889 Other specified postprocedural states: Secondary | ICD-10-CM | POA: Diagnosis not present

## 2017-09-22 DIAGNOSIS — L539 Erythematous condition, unspecified: Secondary | ICD-10-CM | POA: Diagnosis not present

## 2017-09-22 DIAGNOSIS — R110 Nausea: Secondary | ICD-10-CM | POA: Diagnosis not present

## 2017-09-22 DIAGNOSIS — Z79899 Other long term (current) drug therapy: Secondary | ICD-10-CM | POA: Diagnosis not present

## 2017-09-22 DIAGNOSIS — A0471 Enterocolitis due to Clostridium difficile, recurrent: Secondary | ICD-10-CM | POA: Diagnosis not present

## 2017-09-22 DIAGNOSIS — N179 Acute kidney failure, unspecified: Secondary | ICD-10-CM | POA: Diagnosis not present

## 2017-09-22 DIAGNOSIS — T86898 Other complications of other transplanted tissue: Secondary | ICD-10-CM | POA: Diagnosis not present

## 2017-09-22 DIAGNOSIS — Z6821 Body mass index (BMI) 21.0-21.9, adult: Secondary | ICD-10-CM | POA: Diagnosis not present

## 2017-09-25 DIAGNOSIS — D89811 Chronic graft-versus-host disease: Secondary | ICD-10-CM | POA: Diagnosis not present

## 2017-09-26 DIAGNOSIS — L309 Dermatitis, unspecified: Secondary | ICD-10-CM | POA: Diagnosis not present

## 2017-09-26 DIAGNOSIS — D692 Other nonthrombocytopenic purpura: Secondary | ICD-10-CM | POA: Diagnosis not present

## 2017-09-27 DIAGNOSIS — D89811 Chronic graft-versus-host disease: Secondary | ICD-10-CM | POA: Diagnosis not present

## 2017-09-27 MED ORDER — TACROLIMUS 0.5 MG CAPSULE
ORAL_CAPSULE | 3 refills | 0 days | Status: CP
Start: 2017-09-27 — End: 2017-10-26

## 2017-09-29 DIAGNOSIS — D89811 Chronic graft-versus-host disease: Secondary | ICD-10-CM | POA: Diagnosis not present

## 2017-10-02 DIAGNOSIS — D89811 Chronic graft-versus-host disease: Secondary | ICD-10-CM | POA: Diagnosis not present

## 2017-10-04 DIAGNOSIS — D89811 Chronic graft-versus-host disease: Secondary | ICD-10-CM | POA: Diagnosis not present

## 2017-10-05 ENCOUNTER — Ambulatory Visit
Admission: RE | Admit: 2017-10-05 | Discharge: 2017-10-05 | Disposition: A | Discharge status: Home / Self Care | Payer: MEDICARE | Attending: Nurse Practitioner

## 2017-10-05 ENCOUNTER — Ambulatory Visit
Admission: RE | Admit: 2017-10-05 | Discharge: 2017-10-05 | Disposition: A | Discharge status: Home / Self Care | Payer: MEDICARE

## 2017-10-05 DIAGNOSIS — M79644 Pain in right finger(s): Secondary | ICD-10-CM

## 2017-10-05 DIAGNOSIS — M542 Cervicalgia: Principal | ICD-10-CM

## 2017-10-05 DIAGNOSIS — Z9484 Stem cells transplant status: Principal | ICD-10-CM

## 2017-10-05 DIAGNOSIS — D89810 Acute graft-versus-host disease: Secondary | ICD-10-CM

## 2017-10-05 DIAGNOSIS — C9200 Acute myeloblastic leukemia, not having achieved remission: Principal | ICD-10-CM

## 2017-10-05 DIAGNOSIS — D899 Disorder involving the immune mechanism, unspecified: Secondary | ICD-10-CM

## 2017-10-05 DIAGNOSIS — Z5181 Encounter for therapeutic drug level monitoring: Secondary | ICD-10-CM

## 2017-10-05 DIAGNOSIS — D849 Immunodeficiency, unspecified: Secondary | ICD-10-CM | POA: Diagnosis not present

## 2017-10-05 DIAGNOSIS — Z6821 Body mass index (BMI) 21.0-21.9, adult: Secondary | ICD-10-CM | POA: Diagnosis not present

## 2017-10-05 DIAGNOSIS — Z8546 Personal history of malignant neoplasm of prostate: Secondary | ICD-10-CM | POA: Diagnosis not present

## 2017-10-05 DIAGNOSIS — T865 Complications of stem cell transplant: Secondary | ICD-10-CM | POA: Diagnosis not present

## 2017-10-05 DIAGNOSIS — Z85820 Personal history of malignant melanoma of skin: Secondary | ICD-10-CM | POA: Diagnosis not present

## 2017-10-05 DIAGNOSIS — M79643 Pain in unspecified hand: Secondary | ICD-10-CM | POA: Diagnosis not present

## 2017-10-05 DIAGNOSIS — M19041 Primary osteoarthritis, right hand: Secondary | ICD-10-CM | POA: Diagnosis not present

## 2017-10-05 DIAGNOSIS — Z79899 Other long term (current) drug therapy: Secondary | ICD-10-CM | POA: Diagnosis not present

## 2017-10-05 DIAGNOSIS — D89813 Graft-versus-host disease, unspecified: Secondary | ICD-10-CM | POA: Diagnosis not present

## 2017-10-05 DIAGNOSIS — C9201 Acute myeloblastic leukemia, in remission: Secondary | ICD-10-CM | POA: Diagnosis not present

## 2017-10-05 DIAGNOSIS — A0471 Enterocolitis due to Clostridium difficile, recurrent: Secondary | ICD-10-CM | POA: Diagnosis not present

## 2017-10-05 DIAGNOSIS — I10 Essential (primary) hypertension: Secondary | ICD-10-CM | POA: Diagnosis not present

## 2017-10-05 DIAGNOSIS — R600 Localized edema: Secondary | ICD-10-CM | POA: Diagnosis not present

## 2017-10-05 DIAGNOSIS — M19011 Primary osteoarthritis, right shoulder: Secondary | ICD-10-CM | POA: Diagnosis not present

## 2017-10-05 MED ORDER — CLOBETASOL 0.05 % SHAMPOO
Freq: Every day | TOPICAL | 0 refills | 0 days | Status: CP | PRN
Start: 2017-10-05 — End: 2017-10-06

## 2017-10-06 DIAGNOSIS — D89811 Chronic graft-versus-host disease: Secondary | ICD-10-CM | POA: Diagnosis not present

## 2017-10-06 MED ORDER — FLUOCINOLONE 0.01 % SHAMPOO
Freq: Every day | TOPICAL | 3 refills | 0 days | Status: CP
Start: 2017-10-06 — End: 2018-06-01

## 2017-10-09 DIAGNOSIS — D89811 Chronic graft-versus-host disease: Secondary | ICD-10-CM | POA: Diagnosis not present

## 2017-10-11 ENCOUNTER — Ambulatory Visit
Admission: RE | Admit: 2017-10-11 | Discharge: 2017-10-11 | Disposition: A | Discharge status: Home / Self Care | Payer: MEDICARE

## 2017-10-11 DIAGNOSIS — C9200 Acute myeloblastic leukemia, not having achieved remission: Secondary | ICD-10-CM

## 2017-10-11 DIAGNOSIS — Z9484 Stem cells transplant status: Secondary | ICD-10-CM

## 2017-10-11 DIAGNOSIS — D89810 Acute graft-versus-host disease: Secondary | ICD-10-CM

## 2017-10-11 DIAGNOSIS — M8580 Other specified disorders of bone density and structure, unspecified site: Secondary | ICD-10-CM

## 2017-10-11 DIAGNOSIS — I10 Essential (primary) hypertension: Principal | ICD-10-CM

## 2017-10-11 DIAGNOSIS — D899 Disorder involving the immune mechanism, unspecified: Secondary | ICD-10-CM

## 2017-10-11 DIAGNOSIS — Z8546 Personal history of malignant neoplasm of prostate: Secondary | ICD-10-CM | POA: Diagnosis not present

## 2017-10-11 DIAGNOSIS — M19049 Primary osteoarthritis, unspecified hand: Secondary | ICD-10-CM | POA: Diagnosis not present

## 2017-10-11 DIAGNOSIS — N179 Acute kidney failure, unspecified: Secondary | ICD-10-CM | POA: Diagnosis not present

## 2017-10-11 DIAGNOSIS — Z9079 Acquired absence of other genital organ(s): Secondary | ICD-10-CM | POA: Diagnosis not present

## 2017-10-11 DIAGNOSIS — R21 Rash and other nonspecific skin eruption: Secondary | ICD-10-CM | POA: Diagnosis not present

## 2017-10-11 DIAGNOSIS — A0471 Enterocolitis due to Clostridium difficile, recurrent: Secondary | ICD-10-CM | POA: Diagnosis not present

## 2017-10-11 DIAGNOSIS — Z85820 Personal history of malignant melanoma of skin: Secondary | ICD-10-CM | POA: Diagnosis not present

## 2017-10-11 DIAGNOSIS — Z9889 Other specified postprocedural states: Secondary | ICD-10-CM | POA: Diagnosis not present

## 2017-10-11 DIAGNOSIS — C9201 Acute myeloblastic leukemia, in remission: Secondary | ICD-10-CM | POA: Diagnosis not present

## 2017-10-11 DIAGNOSIS — Z7952 Long term (current) use of systemic steroids: Secondary | ICD-10-CM | POA: Diagnosis not present

## 2017-10-11 DIAGNOSIS — T865 Complications of stem cell transplant: Secondary | ICD-10-CM | POA: Diagnosis not present

## 2017-10-11 DIAGNOSIS — D89811 Chronic graft-versus-host disease: Secondary | ICD-10-CM | POA: Diagnosis not present

## 2017-10-11 DIAGNOSIS — M7989 Other specified soft tissue disorders: Secondary | ICD-10-CM | POA: Diagnosis not present

## 2017-10-11 DIAGNOSIS — R600 Localized edema: Secondary | ICD-10-CM | POA: Diagnosis not present

## 2017-10-11 DIAGNOSIS — D849 Immunodeficiency, unspecified: Secondary | ICD-10-CM | POA: Diagnosis not present

## 2017-10-11 DIAGNOSIS — Z6822 Body mass index (BMI) 22.0-22.9, adult: Secondary | ICD-10-CM | POA: Diagnosis not present

## 2017-10-11 DIAGNOSIS — Z79899 Other long term (current) drug therapy: Secondary | ICD-10-CM | POA: Diagnosis not present

## 2017-10-11 MED ORDER — PREDNISONE 5 MG TABLET
Freq: Every day | ORAL | 0 refills | 0.00000 days
Start: 2017-10-11 — End: 2017-10-27

## 2017-10-25 DIAGNOSIS — D89811 Chronic graft-versus-host disease: Secondary | ICD-10-CM | POA: Diagnosis not present

## 2017-10-26 ENCOUNTER — Ambulatory Visit
Admission: RE | Admit: 2017-10-26 | Discharge: 2017-10-26 | Disposition: A | Discharge status: Home / Self Care | Payer: MEDICARE

## 2017-10-26 ENCOUNTER — Ambulatory Visit
Admission: RE | Admit: 2017-10-26 | Discharge: 2017-10-26 | Disposition: A | Discharge status: Home / Self Care | Payer: MEDICARE | Attending: Family | Admitting: Family

## 2017-10-26 DIAGNOSIS — D89813 Graft-versus-host disease, unspecified: Principal | ICD-10-CM

## 2017-10-26 DIAGNOSIS — Z9484 Stem cells transplant status: Principal | ICD-10-CM

## 2017-10-26 DIAGNOSIS — M8580 Other specified disorders of bone density and structure, unspecified site: Secondary | ICD-10-CM

## 2017-10-26 DIAGNOSIS — D469 Myelodysplastic syndrome, unspecified: Secondary | ICD-10-CM

## 2017-10-26 DIAGNOSIS — D899 Disorder involving the immune mechanism, unspecified: Secondary | ICD-10-CM

## 2017-10-26 DIAGNOSIS — Z23 Encounter for immunization: Secondary | ICD-10-CM

## 2017-10-26 DIAGNOSIS — R739 Hyperglycemia, unspecified: Secondary | ICD-10-CM

## 2017-10-26 DIAGNOSIS — D89810 Acute graft-versus-host disease: Secondary | ICD-10-CM

## 2017-10-26 DIAGNOSIS — T451X5A Adverse effect of antineoplastic and immunosuppressive drugs, initial encounter: Secondary | ICD-10-CM

## 2017-10-26 DIAGNOSIS — Z7682 Awaiting organ transplant status: Secondary | ICD-10-CM

## 2017-10-26 DIAGNOSIS — D46Z Other myelodysplastic syndromes: Secondary | ICD-10-CM

## 2017-10-26 DIAGNOSIS — Z1589 Genetic susceptibility to other disease: Secondary | ICD-10-CM

## 2017-10-26 DIAGNOSIS — D701 Agranulocytosis secondary to cancer chemotherapy: Secondary | ICD-10-CM

## 2017-10-26 DIAGNOSIS — A0472 Enterocolitis due to Clostridium difficile, not specified as recurrent: Secondary | ICD-10-CM

## 2017-10-26 DIAGNOSIS — I10 Essential (primary) hypertension: Secondary | ICD-10-CM

## 2017-10-26 DIAGNOSIS — E58 Dietary calcium deficiency: Secondary | ICD-10-CM

## 2017-10-26 DIAGNOSIS — T380X5A Adverse effect of glucocorticoids and synthetic analogues, initial encounter: Secondary | ICD-10-CM

## 2017-10-26 DIAGNOSIS — Z48298 Encounter for aftercare following other organ transplant: Secondary | ICD-10-CM | POA: Diagnosis not present

## 2017-10-26 DIAGNOSIS — K4090 Unilateral inguinal hernia, without obstruction or gangrene, not specified as recurrent: Secondary | ICD-10-CM | POA: Diagnosis not present

## 2017-10-26 DIAGNOSIS — Z79899 Other long term (current) drug therapy: Secondary | ICD-10-CM | POA: Diagnosis not present

## 2017-10-26 DIAGNOSIS — Z9221 Personal history of antineoplastic chemotherapy: Secondary | ICD-10-CM | POA: Diagnosis not present

## 2017-10-26 DIAGNOSIS — Z8546 Personal history of malignant neoplasm of prostate: Secondary | ICD-10-CM | POA: Diagnosis not present

## 2017-10-26 DIAGNOSIS — Z79891 Long term (current) use of opiate analgesic: Secondary | ICD-10-CM | POA: Diagnosis not present

## 2017-10-26 DIAGNOSIS — R600 Localized edema: Secondary | ICD-10-CM | POA: Diagnosis not present

## 2017-10-26 DIAGNOSIS — D849 Immunodeficiency, unspecified: Secondary | ICD-10-CM | POA: Diagnosis not present

## 2017-10-26 DIAGNOSIS — Z7952 Long term (current) use of systemic steroids: Secondary | ICD-10-CM | POA: Diagnosis not present

## 2017-10-26 DIAGNOSIS — C92A0 Acute myeloid leukemia with multilineage dysplasia, not having achieved remission: Secondary | ICD-10-CM | POA: Diagnosis not present

## 2017-10-26 DIAGNOSIS — L729 Follicular cyst of the skin and subcutaneous tissue, unspecified: Secondary | ICD-10-CM | POA: Diagnosis not present

## 2017-10-26 DIAGNOSIS — Z85820 Personal history of malignant melanoma of skin: Secondary | ICD-10-CM | POA: Diagnosis not present

## 2017-10-26 DIAGNOSIS — N179 Acute kidney failure, unspecified: Secondary | ICD-10-CM | POA: Diagnosis not present

## 2017-10-26 MED ORDER — TACROLIMUS 0.5 MG CAPSULE
ORAL_CAPSULE | Freq: Two times a day (BID) | ORAL | 4 refills | 0 days | Status: CP
Start: 2017-10-26 — End: 2017-10-27

## 2017-10-27 DIAGNOSIS — D89811 Chronic graft-versus-host disease: Secondary | ICD-10-CM | POA: Diagnosis not present

## 2017-10-27 MED ORDER — TACROLIMUS 0.5 MG CAPSULE
ORAL_CAPSULE | 4 refills | 0 days
Start: 2017-10-27 — End: 2017-11-09

## 2017-10-27 MED ORDER — PREDNISONE 5 MG TABLET
Freq: Every day | ORAL | 0 refills | 0 days
Start: 2017-10-27 — End: 2017-11-09

## 2017-10-30 DIAGNOSIS — L309 Dermatitis, unspecified: Secondary | ICD-10-CM | POA: Diagnosis not present

## 2017-10-30 DIAGNOSIS — L723 Sebaceous cyst: Secondary | ICD-10-CM | POA: Diagnosis not present

## 2017-10-30 DIAGNOSIS — D89811 Chronic graft-versus-host disease: Secondary | ICD-10-CM | POA: Diagnosis not present

## 2017-10-30 DIAGNOSIS — L02239 Carbuncle of trunk, unspecified: Secondary | ICD-10-CM | POA: Diagnosis not present

## 2017-10-30 DIAGNOSIS — L720 Epidermal cyst: Secondary | ICD-10-CM | POA: Diagnosis not present

## 2017-11-02 DIAGNOSIS — D89811 Chronic graft-versus-host disease: Secondary | ICD-10-CM | POA: Diagnosis not present

## 2017-11-06 MED ORDER — ALLERGY RELIEF (LORATADINE) 10 MG TABLET
ORAL_TABLET | 1 refills | 0 days | Status: CP
Start: 2017-11-06 — End: 2018-01-17

## 2017-11-07 DIAGNOSIS — D89811 Chronic graft-versus-host disease: Secondary | ICD-10-CM | POA: Diagnosis not present

## 2017-11-08 ENCOUNTER — Ambulatory Visit
Admission: RE | Admit: 2017-11-08 | Discharge: 2017-11-08 | Disposition: A | Discharge status: Home / Self Care | Payer: MEDICARE

## 2017-11-08 ENCOUNTER — Ambulatory Visit
Admission: RE | Admit: 2017-11-08 | Discharge: 2017-11-08 | Disposition: A | Discharge status: Home / Self Care | Payer: MEDICARE | Admitting: Medical

## 2017-11-08 DIAGNOSIS — Z9484 Stem cells transplant status: Principal | ICD-10-CM

## 2017-11-08 DIAGNOSIS — D469 Myelodysplastic syndrome, unspecified: Principal | ICD-10-CM

## 2017-11-08 DIAGNOSIS — H11823 Conjunctivochalasis, bilateral: Principal | ICD-10-CM

## 2017-11-08 DIAGNOSIS — H04203 Unspecified epiphora, bilateral lacrimal glands: Secondary | ICD-10-CM

## 2017-11-08 DIAGNOSIS — I10 Essential (primary) hypertension: Secondary | ICD-10-CM | POA: Diagnosis not present

## 2017-11-08 DIAGNOSIS — Z9221 Personal history of antineoplastic chemotherapy: Secondary | ICD-10-CM | POA: Diagnosis not present

## 2017-11-08 DIAGNOSIS — Z79899 Other long term (current) drug therapy: Secondary | ICD-10-CM | POA: Diagnosis not present

## 2017-11-08 DIAGNOSIS — C9200 Acute myeloblastic leukemia, not having achieved remission: Secondary | ICD-10-CM | POA: Diagnosis not present

## 2017-11-08 DIAGNOSIS — D89813 Graft-versus-host disease, unspecified: Secondary | ICD-10-CM | POA: Diagnosis not present

## 2017-11-08 DIAGNOSIS — Z9481 Bone marrow transplant status: Secondary | ICD-10-CM | POA: Diagnosis not present

## 2017-11-08 MED ORDER — PREDNISOLONE SODIUM PHOSPHATE 1 % EYE DROPS
Freq: Four times a day (QID) | OPHTHALMIC | 0 refills | 0.00000 days | Status: CP
Start: 2017-11-08 — End: 2017-11-11

## 2017-11-09 MED ORDER — PREDNISONE 5 MG TABLET
ORAL_TABLET | Freq: Every day | ORAL | 3 refills | 0.00000 days | Status: CP
Start: 2017-11-09 — End: 2018-03-27

## 2017-11-09 MED ORDER — TACROLIMUS 0.5 MG CAPSULE
ORAL_CAPSULE | 4 refills | 0 days | Status: CP
Start: 2017-11-09 — End: 2018-03-27

## 2017-11-10 DIAGNOSIS — D89811 Chronic graft-versus-host disease: Secondary | ICD-10-CM | POA: Diagnosis not present

## 2017-11-13 DIAGNOSIS — D89811 Chronic graft-versus-host disease: Secondary | ICD-10-CM | POA: Diagnosis not present

## 2017-11-15 MED ORDER — DILTIAZEM 60 MG TABLET
ORAL_TABLET | Freq: Two times a day (BID) | ORAL | 5 refills | 0.00000 days | Status: CP
Start: 2017-11-15 — End: 2018-04-24

## 2017-11-16 MED ORDER — VALACYCLOVIR 500 MG TABLET
ORAL_TABLET | 10 refills | 0 days | Status: CP
Start: 2017-11-16 — End: 2018-01-02

## 2017-11-17 DIAGNOSIS — D89811 Chronic graft-versus-host disease: Secondary | ICD-10-CM | POA: Diagnosis not present

## 2017-11-22 DIAGNOSIS — D89811 Chronic graft-versus-host disease: Secondary | ICD-10-CM | POA: Diagnosis not present

## 2017-11-24 DIAGNOSIS — D89811 Chronic graft-versus-host disease: Secondary | ICD-10-CM | POA: Diagnosis not present

## 2017-11-27 DIAGNOSIS — D89811 Chronic graft-versus-host disease: Secondary | ICD-10-CM | POA: Diagnosis not present

## 2017-12-01 ENCOUNTER — Ambulatory Visit: Admit: 2017-12-01 | Discharge: 2017-12-01 | Payer: MEDICARE

## 2017-12-01 ENCOUNTER — Other Ambulatory Visit: Admit: 2017-12-01 | Discharge: 2017-12-01 | Payer: MEDICARE

## 2017-12-01 ENCOUNTER — Ambulatory Visit
Admit: 2017-12-01 | Discharge: 2017-12-01 | Payer: MEDICARE | Attending: Hematology & Oncology | Primary: Hematology & Oncology

## 2017-12-01 DIAGNOSIS — D46Z Other myelodysplastic syndromes: Principal | ICD-10-CM

## 2017-12-01 DIAGNOSIS — Z9484 Stem cells transplant status: Secondary | ICD-10-CM

## 2017-12-01 DIAGNOSIS — H11823 Conjunctivochalasis, bilateral: Principal | ICD-10-CM

## 2017-12-01 DIAGNOSIS — I10 Essential (primary) hypertension: Principal | ICD-10-CM

## 2017-12-01 DIAGNOSIS — R739 Hyperglycemia, unspecified: Secondary | ICD-10-CM

## 2017-12-01 DIAGNOSIS — C9200 Acute myeloblastic leukemia, not having achieved remission: Secondary | ICD-10-CM

## 2017-12-01 DIAGNOSIS — D899 Disorder involving the immune mechanism, unspecified: Secondary | ICD-10-CM

## 2017-12-01 DIAGNOSIS — D89810 Acute graft-versus-host disease: Secondary | ICD-10-CM

## 2017-12-01 DIAGNOSIS — T380X5A Adverse effect of glucocorticoids and synthetic analogues, initial encounter: Secondary | ICD-10-CM

## 2017-12-01 DIAGNOSIS — A0472 Enterocolitis due to Clostridium difficile, not specified as recurrent: Secondary | ICD-10-CM

## 2017-12-01 MED ORDER — PREDNISOLONE ACETATE 1 % EYE DROPS,SUSPENSION
Freq: Every day | OPHTHALMIC | 3 refills | 0 days | Status: CP | PRN
Start: 2017-12-01 — End: 2018-01-02

## 2017-12-25 ENCOUNTER — Telehealth: Payer: Self-pay | Admitting: Pulmonary Disease

## 2017-12-25 DIAGNOSIS — J90 Pleural effusion, not elsewhere classified: Secondary | ICD-10-CM

## 2017-12-25 NOTE — Telephone Encounter (Signed)
OK by me, 2 view CXR

## 2017-12-25 NOTE — Telephone Encounter (Signed)
Called pt and advised message from the provider. Pt understood and verbalized understanding. Nothing further is needed.   Appt made.

## 2017-12-25 NOTE — Telephone Encounter (Signed)
BQ can we order a chest xray for follow up appt in February? He would like to have this done before appt. I called pt to let him know I would have to approve this. Please advise.   Instructions      Return in about 4 months (around 12/23/2017).  For pleural effusions: We will order a chest x-ray to evaluate further  For your history of mild COPD: When okay by your transplant physicians, please get a flu shot Let us know if you have any sort of wheezing or shortness of breath  We will see you back in 4-6 months or sooner if needed

## 2017-12-27 MED ORDER — DAPSONE 100 MG TABLET
ORAL_TABLET | 4 refills | 0 days | Status: CP
Start: 2017-12-27 — End: 2018-05-30

## 2018-01-02 ENCOUNTER — Ambulatory Visit
Admit: 2018-01-02 | Discharge: 2018-01-02 | Payer: MEDICARE | Attending: Hematology & Oncology | Primary: Hematology & Oncology

## 2018-01-02 ENCOUNTER — Other Ambulatory Visit: Admit: 2018-01-02 | Discharge: 2018-01-02 | Payer: MEDICARE

## 2018-01-02 DIAGNOSIS — Z9484 Stem cells transplant status: Secondary | ICD-10-CM

## 2018-01-02 DIAGNOSIS — T380X5A Adverse effect of glucocorticoids and synthetic analogues, initial encounter: Secondary | ICD-10-CM

## 2018-01-02 DIAGNOSIS — R739 Hyperglycemia, unspecified: Secondary | ICD-10-CM

## 2018-01-02 DIAGNOSIS — C9200 Acute myeloblastic leukemia, not having achieved remission: Secondary | ICD-10-CM

## 2018-01-02 DIAGNOSIS — C9201 Acute myeloblastic leukemia, in remission: Secondary | ICD-10-CM

## 2018-01-02 DIAGNOSIS — I10 Essential (primary) hypertension: Principal | ICD-10-CM

## 2018-01-02 DIAGNOSIS — D89813 Graft-versus-host disease, unspecified: Secondary | ICD-10-CM

## 2018-01-02 DIAGNOSIS — D89810 Acute graft-versus-host disease: Secondary | ICD-10-CM

## 2018-01-02 DIAGNOSIS — A0472 Enterocolitis due to Clostridium difficile, not specified as recurrent: Secondary | ICD-10-CM

## 2018-01-02 MED ORDER — TADALAFIL 10 MG TABLET
ORAL_TABLET | Freq: Every day | ORAL | 1 refills | 0.00000 days | Status: CP | PRN
Start: 2018-01-02 — End: 2018-01-08

## 2018-01-02 MED ORDER — VALACYCLOVIR 500 MG TABLET
ORAL_TABLET | Freq: Every day | ORAL | 4 refills | 0 days | Status: CP
Start: 2018-01-02 — End: ?

## 2018-01-08 MED ORDER — TADALAFIL 5 MG TABLET
ORAL_TABLET | Freq: Every day | ORAL | 1 refills | 0.00000 days | Status: CP | PRN
Start: 2018-01-08 — End: 2018-02-07

## 2018-01-15 ENCOUNTER — Telehealth: Payer: Self-pay | Admitting: Acute Care

## 2018-01-15 ENCOUNTER — Encounter: Payer: Self-pay | Admitting: Acute Care

## 2018-01-15 ENCOUNTER — Other Ambulatory Visit (INDEPENDENT_AMBULATORY_CARE_PROVIDER_SITE_OTHER): Payer: Medicare Other

## 2018-01-15 ENCOUNTER — Other Ambulatory Visit: Payer: Medicare Other

## 2018-01-15 ENCOUNTER — Ambulatory Visit (INDEPENDENT_AMBULATORY_CARE_PROVIDER_SITE_OTHER): Payer: Medicare Other | Admitting: Acute Care

## 2018-01-15 ENCOUNTER — Ambulatory Visit (INDEPENDENT_AMBULATORY_CARE_PROVIDER_SITE_OTHER)
Admission: RE | Admit: 2018-01-15 | Discharge: 2018-01-15 | Disposition: A | Discharge status: Home / Self Care | Payer: Medicare Other | Source: Ambulatory Visit | Attending: Pulmonary Disease | Admitting: Pulmonary Disease

## 2018-01-15 ENCOUNTER — Other Ambulatory Visit: Payer: Self-pay | Admitting: Acute Care

## 2018-01-15 VITALS — BP 116/68 | HR 91 | Ht 67.0 in | Wt 146.0 lb

## 2018-01-15 DIAGNOSIS — R0602 Shortness of breath: Secondary | ICD-10-CM

## 2018-01-15 DIAGNOSIS — J90 Pleural effusion, not elsewhere classified: Secondary | ICD-10-CM

## 2018-01-15 DIAGNOSIS — D89813 Graft-versus-host disease, unspecified: Secondary | ICD-10-CM | POA: Diagnosis not present

## 2018-01-15 DIAGNOSIS — R0609 Other forms of dyspnea: Secondary | ICD-10-CM

## 2018-01-15 LAB — CBC WITH DIFFERENTIAL/PLATELET
BASOS PCT: 0.4 % (ref 0.0–3.0)
Basophils Absolute: 0 10*3/uL (ref 0.0–0.1)
EOS ABS: 0.1 10*3/uL (ref 0.0–0.7)
Eosinophils Relative: 1.4 % (ref 0.0–5.0)
HCT: 34.5 % — ABNORMAL LOW (ref 39.0–52.0)
HEMOGLOBIN: 11.3 g/dL — AB (ref 13.0–17.0)
Lymphocytes Relative: 9.1 % — ABNORMAL LOW (ref 12.0–46.0)
Lymphs Abs: 0.7 10*3/uL (ref 0.7–4.0)
MCHC: 32.8 g/dL (ref 30.0–36.0)
MCV: 111 fl — ABNORMAL HIGH (ref 78.0–100.0)
MONO ABS: 0.8 10*3/uL (ref 0.1–1.0)
Monocytes Relative: 11 % (ref 3.0–12.0)
Neutro Abs: 5.6 10*3/uL (ref 1.4–7.7)
Neutrophils Relative %: 78.1 % — ABNORMAL HIGH (ref 43.0–77.0)
Platelets: 168 10*3/uL (ref 150.0–400.0)
RBC: 3.11 Mil/uL — ABNORMAL LOW (ref 4.22–5.81)
RDW: 13.8 % (ref 11.5–15.5)
WBC: 7.2 10*3/uL (ref 4.0–10.5)

## 2018-01-15 MED ORDER — ALBUTEROL SULFATE HFA 108 (90 BASE) MCG/ACT IN AERS: 2.0000 | INHALATION_SPRAY | Freq: Four times a day (QID) | RESPIRATORY_TRACT | 3 refills | Status: AC | PRN

## 2018-01-15 NOTE — Assessment & Plan Note (Signed)
Dyspnea on exertion with no acute changed in CXR Walking saturation on RA with nadir of 95% Spirometry >> Normal Plan: CBC today We will do a CXR today. We will call you with results We will walk you today in the office today to see if you desaturate with activity. Spirometry in the office today. Pro Air inhaler for shortness of breath use as needed up to 4 times daily. Follow up with Dr. Lake Bells in 1 week as is scheduled. Please contact office for sooner follow up if symptoms do not improve or worsen or seek emergency care   After reviewing CXR, and discussing with Dr. Lake Bells, we will obtain PFT's and HRCT of the chest prior to follow up appointment with [facility] 01/22/2018

## 2018-01-15 NOTE — Telephone Encounter (Signed)
Please call patient and let him know his CXR was normal. Let him know his blood work shows HGB of 11.3 and WBC of 7.2. Let him know I spoke with Dr. Lake Bells and that we will order an HRCT and full PFT's for Dr. Lake Bells to review at his appointment 01/22/2018. Please order HRCT of the chest and full set PFT's to be done before 01/22/2018 appointment. Thanks so much.

## 2018-01-15 NOTE — Assessment & Plan Note (Signed)
No rash No mouth sores or issues Normal CBC/ WBC Last LFT's 01/02/2018 at [facility]  AST>> 25, ALT>> 27, Total Bili 1.2 Plan: Follow up with Dr. Lake Bells 01/22/2018 with HRCT and Full PFT's
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
## 2018-01-15 NOTE — Patient Instructions (Addendum)
It is nice to meet you today. CBC today We will do a CXR today. We will call you with results We will walk you today in the office today to see if you desaturate with activity. Spirometry in the office today. Pro Air inhaler for shortness of breath use as needed up to 4 times daily. Follow up with Dr. Lake Bells in 1 week as is scheduled. Please contact office for sooner follow up if symptoms do not improve or worsen or seek emergency care

## 2018-01-15 NOTE — Progress Notes (Signed)
cb

## 2018-01-15 NOTE — Telephone Encounter (Signed)
Called pt and advised message from the provider. Pt understood and verbalized understanding. Nothing further is needed.   CT and PFT ordered.

## 2018-01-17 ENCOUNTER — Ambulatory Visit (INDEPENDENT_AMBULATORY_CARE_PROVIDER_SITE_OTHER)
Admission: RE | Admit: 2018-01-17 | Discharge: 2018-01-17 | Disposition: A | Discharge status: Home / Self Care | Payer: Medicare Other | Source: Ambulatory Visit | Attending: Acute Care | Admitting: Acute Care

## 2018-01-17 DIAGNOSIS — R0602 Shortness of breath: Secondary | ICD-10-CM | POA: Diagnosis not present

## 2018-01-17 MED ORDER — LORATADINE 10 MG TABLET
ORAL_TABLET | Freq: Every day | ORAL | 2 refills | 0 days | Status: CP
Start: 2018-01-17 — End: ?

## 2018-01-18 ENCOUNTER — Ambulatory Visit: Payer: Medicare Other

## 2018-01-18 DIAGNOSIS — R0602 Shortness of breath: Secondary | ICD-10-CM

## 2018-01-18 LAB — PULMONARY FUNCTION TEST
DL/VA % PRED: 77 %
DL/VA: 3.42 ml/min/mmHg/L
DLCO COR % PRED: 77 %
DLCO COR: 21.8 ml/min/mmHg
DLCO unc % pred: 68 %
DLCO unc: 19.46 ml/min/mmHg
FEF 25-75 Post: 1.75 L/sec
FEF 25-75 Pre: 1.35 L/sec
FEF2575-%Change-Post: 29 %
FEF2575-%PRED-PRE: 76 %
FEF2575-%Pred-Post: 98 %
FEV1-%Change-Post: 7 %
FEV1-%PRED-PRE: 100 %
FEV1-%Pred-Post: 108 %
FEV1-Post: 2.77 L
FEV1-Pre: 2.57 L
FEV1FVC-%Change-Post: 3 %
FEV1FVC-%Pred-Pre: 89 %
FEV6-%Change-Post: 4 %
FEV6-%PRED-PRE: 115 %
FEV6-%Pred-Post: 121 %
FEV6-PRE: 3.87 L
FEV6-Post: 4.05 L
FEV6FVC-%Change-Post: 1 %
FEV6FVC-%PRED-PRE: 104 %
FEV6FVC-%Pred-Post: 106 %
FVC-%Change-Post: 3 %
FVC-%PRED-PRE: 110 %
FVC-%Pred-Post: 114 %
FVC-PRE: 3.96 L
FVC-Post: 4.11 L
POST FEV6/FVC RATIO: 99 %
PRE FEV1/FVC RATIO: 65 %
Post FEV1/FVC ratio: 67 %
Pre FEV6/FVC Ratio: 98 %
RV % pred: 86 %
RV: 2.14 L
TLC % pred: 95 %
TLC: 6.15 L

## 2018-01-20 IMAGING — CT CT ANGIO CHEST
3 of 7 series · 18 of 36 positions shown · IV contrast (ISOVUE 370)
Comparison: Prior radiograph from earlier the same day.

CLINICAL DATA: Initial evaluation for acute shortness of breath.
History of prostate cancer.

EXAM:
CT ANGIOGRAPHY CHEST WITH CONTRAST
TECHNIQUE: Multidetector CT imaging of the chest was performed using the
standard protocol during bolus administration of intravenous
contrast. Multiplanar CT image reconstructions and MIPs were
obtained to evaluate the vascular anatomy.
CONTRAST:  100 cc of Isovue 370.

[Series 6: thins for pacs · axial · 0.77mm/px · z∈[-270,-20]mm · 15 of 286 slices shown]
[im 18/286  lung]
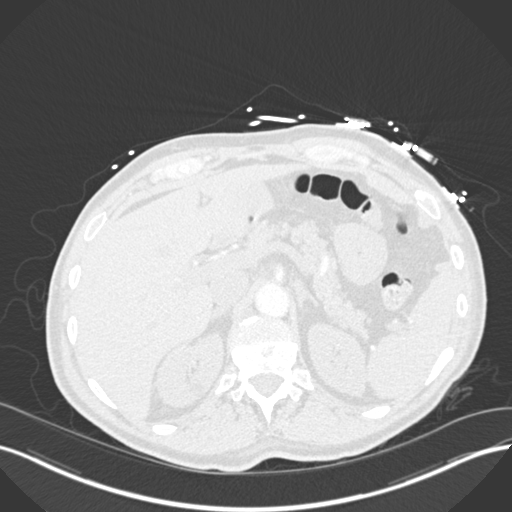
[im 36/286  mediastinal]
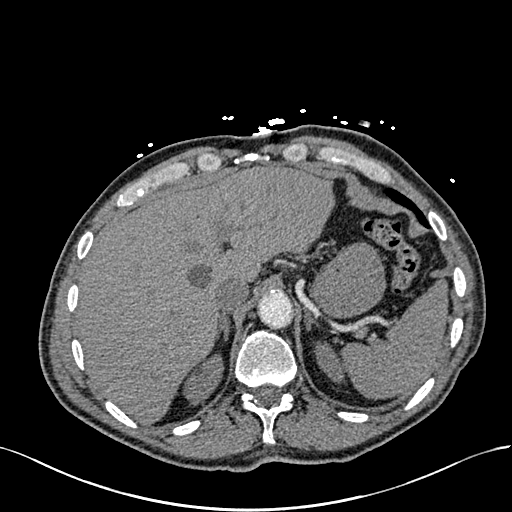
[im 54/286  lung]
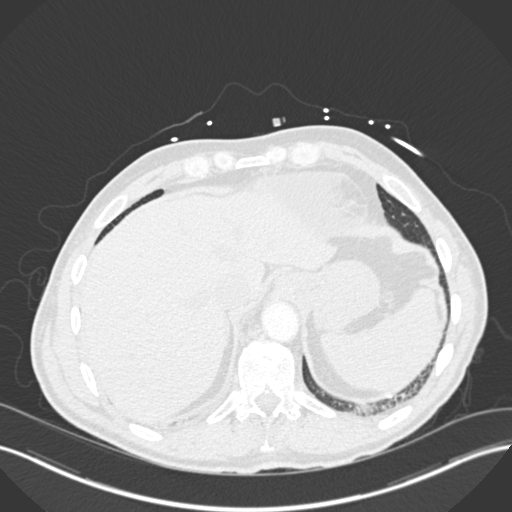
[im 72/286  mediastinal]
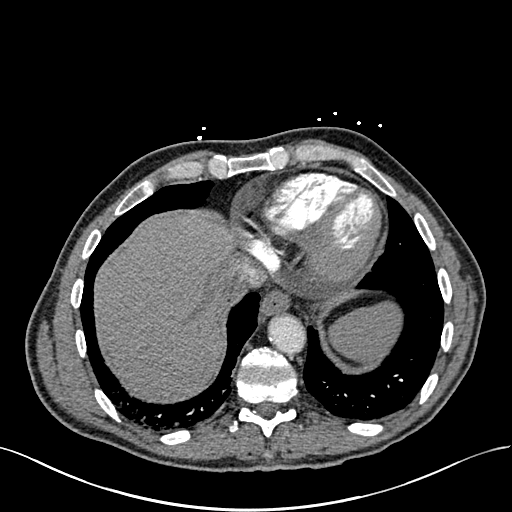
[im 90/286  lung]
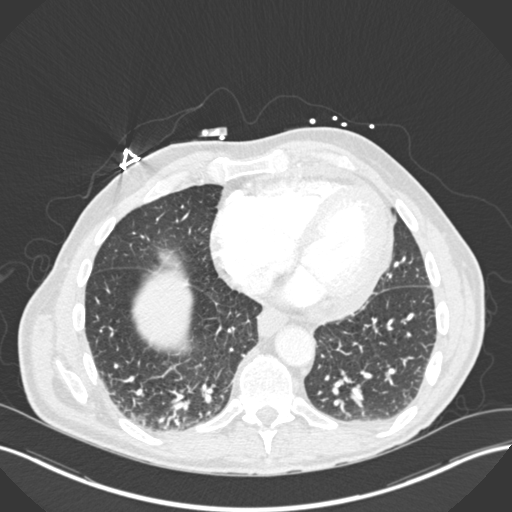
[im 107/286  mediastinal]
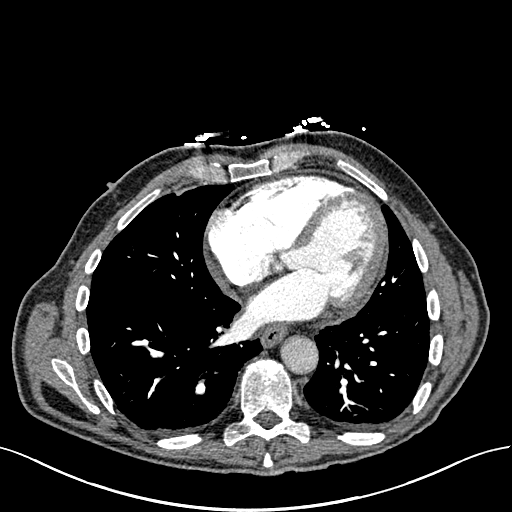
[im 125/286  lung]
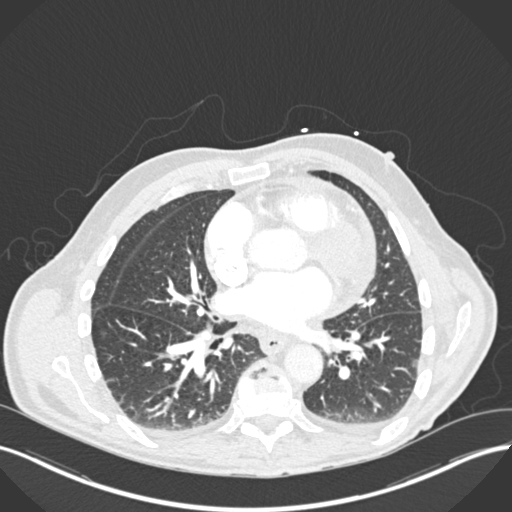
[im 143/286  mediastinal]
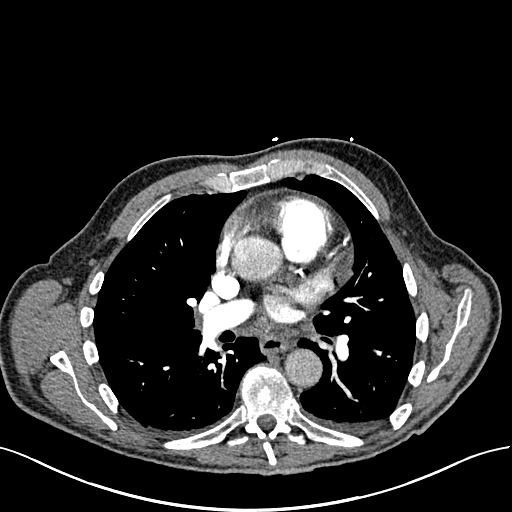
[im 161/286  lung]
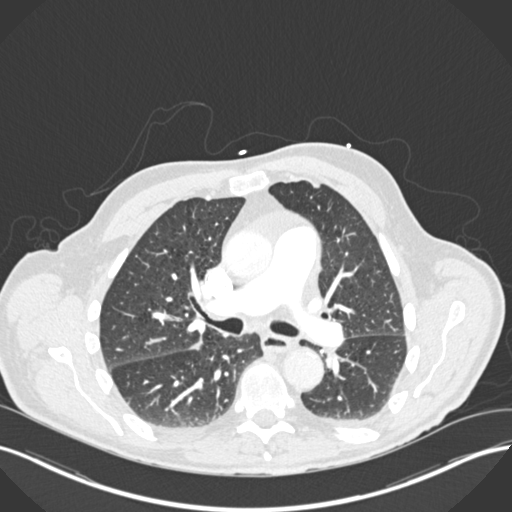
[im 179/286  mediastinal]
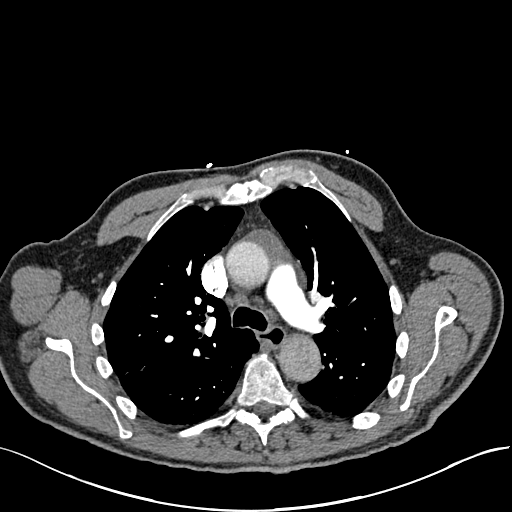
[im 196/286  lung]
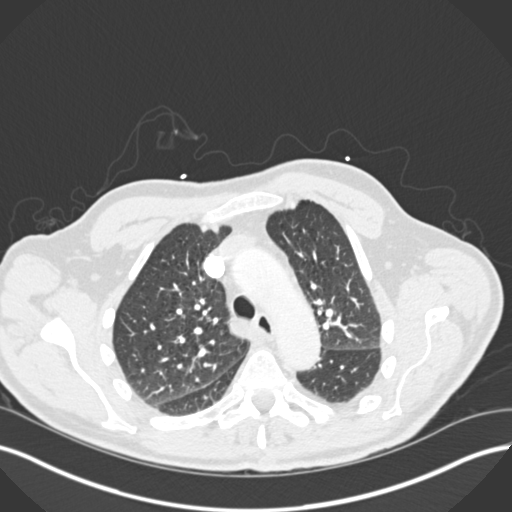
[im 214/286  mediastinal]
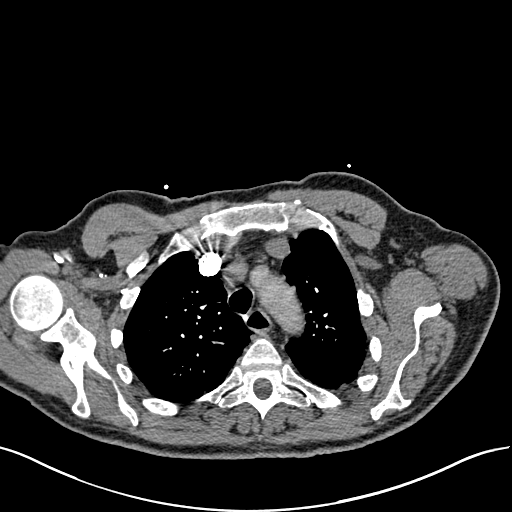
[im 232/286  lung]
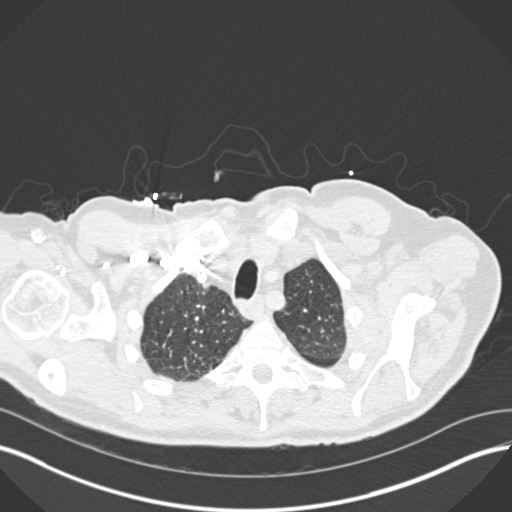
[im 250/286  mediastinal]
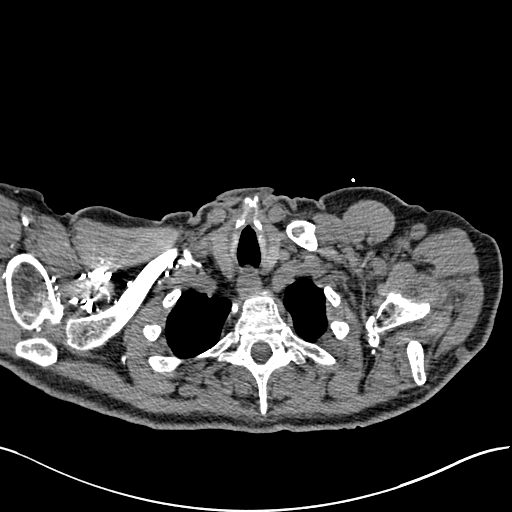
[im 268/286  lung]
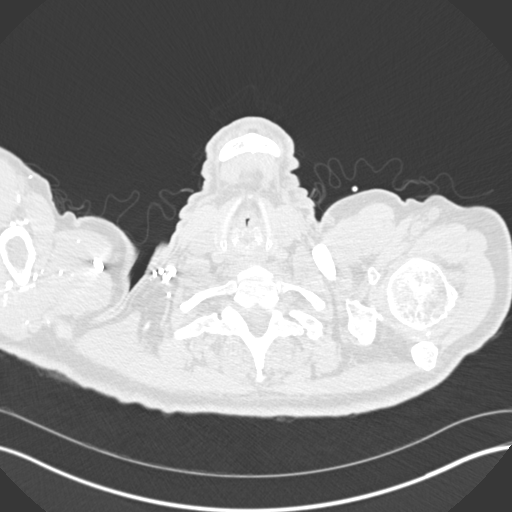

[Series 7: lung windows · axial · 0.77mm/px · z∈[-206,-137]mm · 2 of 91 slices shown]
[im 23/91  mediastinal]
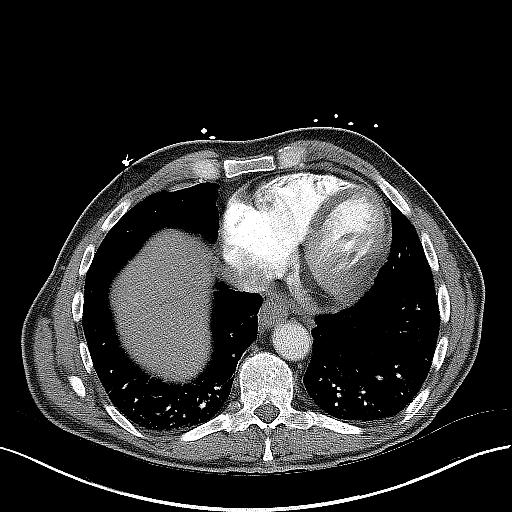
[im 46/91  mediastinal]
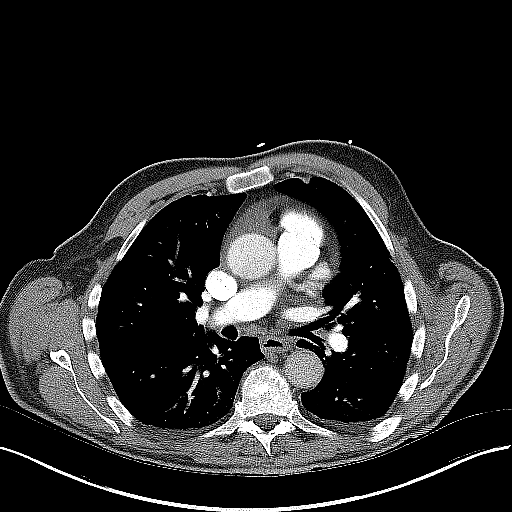

[Series 8: coronal mpr · coronal · 0.58mm/px · 1 of 147 slices shown]
[im 74/147  mediastinal]
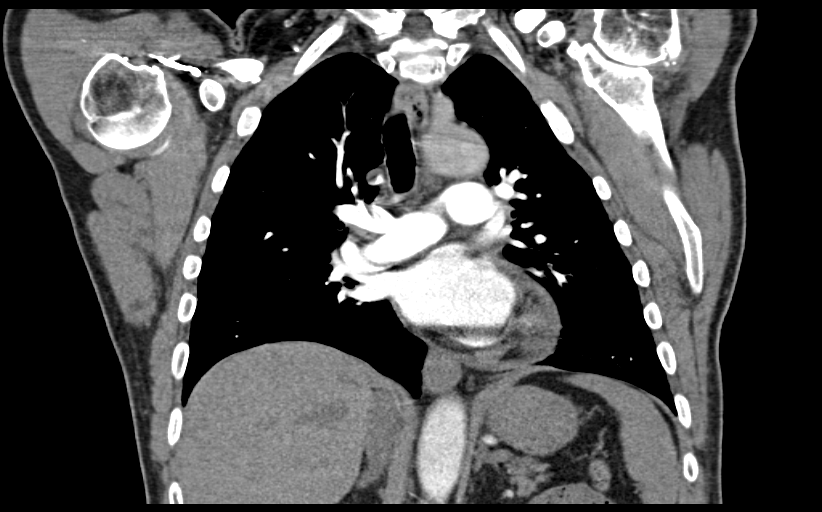

[18 of 36 positions shown; findings below may reference images not displayed]

FINDINGS: Cardiovascular: Intrathoracic aorta of normal caliber without
evidence for aneurysm or other acute abnormality. Visualized great
vessels within normal limits. Mild cardiomegaly present. Scattered
coronary artery calcifications noted. Small pericardial fusion.

Pulmonary arterial tree adequately opacified for evaluation. Main
pulmonary artery measures at the upper limits of normal for size at
3 cm in diameter. No filling defect to suggest acute pulmonary
embolism. Re-formatted imaging confirms these findings.

Mediastinum/Nodes: Thyroid normal. No pathologically enlarged
mediastinal, hilar, or axillary lymph nodes identified. Esophagus
within normal limits.

Lungs/Pleura: Small layering bilateral pleural effusions present.
Mild scattered interlobular septal thickening within the lungs,
suggesting mild pulmonary interstitial edema. Underlying emphysema.
No focal infiltrates. No pneumothorax. No worrisome pulmonary nodule
or mass. Few scattered granulomas noted within the left lung base.

Upper Abdomen: 1.9 cm hypodense lesion within the liver compatible
with a cyst. Additional sub cm hypodensity within the right hepatic
lobe noted, too small the characterize, but may reflect a small cyst
as well. Probable small cyst noted within the right kidney.
Remainder the visualized upper abdomen otherwise unremarkable.

Musculoskeletal: No acute osseous abnormality. No worrisome lytic or
blastic osseous lesion. Scattered mild degenerative changes within
the mid thoracic spine.

Review of the MIP images confirms the above findings.
IMPRESSION: 1. No CT evidence for acute pulmonary embolism.
2. Mild diffuse interlobular septal thickening, suggestive of mild
pulmonary interstitial edema.
3. Small layering bilateral pleural effusions.
4. Mild cardiomegaly with associated small pericardial fusion.
5. Emphysema.

## 2018-01-22 ENCOUNTER — Ambulatory Visit (INDEPENDENT_AMBULATORY_CARE_PROVIDER_SITE_OTHER): Payer: Medicare Other | Admitting: Pulmonary Disease

## 2018-01-22 ENCOUNTER — Encounter: Payer: Self-pay | Admitting: Pulmonary Disease

## 2018-01-22 VITALS — BP 118/70 | HR 80 | Ht 67.0 in | Wt 142.4 lb

## 2018-01-22 DIAGNOSIS — J449 Chronic obstructive pulmonary disease, unspecified: Secondary | ICD-10-CM

## 2018-01-22 DIAGNOSIS — R0602 Shortness of breath: Secondary | ICD-10-CM | POA: Diagnosis not present

## 2018-01-22 DIAGNOSIS — D89813 Graft-versus-host disease, unspecified: Secondary | ICD-10-CM | POA: Diagnosis not present

## 2018-01-22 DIAGNOSIS — R918 Other nonspecific abnormal finding of lung field: Secondary | ICD-10-CM | POA: Diagnosis not present

## 2018-01-22 MED ORDER — UMECLIDINIUM-VILANTEROL 62.5-25 MCG/INH IN AEPB: 1.0000 | INHALATION_SPRAY | Freq: Every day | RESPIRATORY_TRACT | 0 refills | Status: DC

## 2018-01-22 NOTE — Progress Notes (Signed)
Subjective:    Patient ID: Warren [name], male    DOB: 14-May-1941, 77 y.o.   MRN: [id number]  Synopsis: Former patient of Dr. Annamaria Boots for mild COPD who developed myelodysplastic syndrome. Evaluated by  pulmonary while hospitalized at [facility] in October 2017 for staph bacteremia. During that hospitalization he had bilateral pleural effusions.  After this hospitalization he had a bone marrow transplant at [facility] in 2018.    HPI Chief Complaint  Patient presents with  . Follow-up    review CT chest.  pt notes slight improvement since acute OV last week, still c/o sob with exertion.     Warren [name] says that his exercise routine really fell off track this past month which is strange for him.  He noticed shallow breathing and a lower O2 saturation with exertion.  When we walked him here with the forehead probe last week the numbers were normal though.  He has had shortness of breath for a few months now and he says that he is huffing and puffing more with exertion. He says that it is intermittent and he cannot pinpoint a clear trigger. This morning he woke up with shallow breathing.  He took an inhaler this morinng which may have given some relief, but not much.    He's had intermittent leg swelling int he last year, though he thinks it is better than it was a year ago.    He's had a "sniffle" consistently and had some nasal discharge and a cough in the last few days.  He said that he had a sore throat this morning.  He is going to see his heme-onc physician next Tuesday.    He's lost three pounds in the last month.  He had c diff last year.  He ended up with a fecal transplant.   He had some watery stool in the last month as well.    Past Medical History:  Diagnosis Date  . Cancer [facility])    Prostate, Melanoma- lt. Shoulder (no further problems since 2010  . Chronic diastolic CHF (congestive heart failure) ([place]) 09/13/2016  . Complication of anesthesia    versed gave the adverse  reaction pt. ,became aggitated  . COPD (chronic obstructive pulmonary disease) (HCC)    Dr. Annamaria Boots follows  . Diffuse esophageal spasm   . Diverticulosis   . DJD (degenerative joint disease)   . Multiple trauma     horse accident 2003 L SHOULDER  . Oral herpes   . OSA (obstructive sleep apnea)    mild, rare cpap use  . Peripheral neuropathy   . Pulmonary nodule   . Spinal stenosis   . Systolic hypertension       Review of Systems  Constitutional: Negative for diaphoresis, fatigue and fever.  HENT: Negative for rhinorrhea, sinus pressure and sinus pain.   Respiratory: Positive for shortness of breath. Negative for choking and wheezing.   Cardiovascular: Negative for chest pain, palpitations and leg swelling.       Objective:   Physical Exam  Vitals:   01/22/18 0938  BP: 118/70  Pulse: 80  SpO2: 92%  Weight: 142 lb 6.4 oz (64.6 kg)  Height: 5\' 7"  (1.702 m)   RA  Gen: well appearing HENT: OP clear, TM's clear, neck supple PULM: CTA B, normal percussion CV: RRR, no mgr, trace edema GI: BS+, soft, nontender Derm: no cyanosis or rash Psyche: normal mood and affect    PFT: April 2017 ratio 64%, FEV1 2.69 L  87% predicted July 2018 ratio 64%, FEV1 2.6 L, performed at [facility] February 2019 pulmonary function testing: Ratio 67%, FEV1 2.77 L 108% predicted, FVC 4.11 L 114% predicted, FEF 2575 1.75 L 98% predicted total lung capacity 6.2 L 95% predicted, DLCO 19.5 mL 68% predicted  Chest imaging: August 2018 chest x-ray report from [facility]: Trace bilateral pleural effusion CT chest February 2019 images independently reviewed showing some scattered pulmonary nodules which are small, centrilobular emphysema.  CBC    Component Value Date/Time   WBC 7.2 01/15/2018 1318   RBC 3.11 (L) 01/15/2018 1318   HGB 11.3 (L) 01/15/2018 1318   HGB 8.2 (L) 07/27/2017 1414   HCT 34.5 (L) 01/15/2018 1318   HCT 27.0 (L) 07/27/2017 1414   PLT 168.0 01/15/2018 1318   PLT 150 07/27/2017 1414     MCV 111.0 Repeated and verified X2. (H) 01/15/2018 1318   MCV 113.0 (H) 07/27/2017 1414   MCH 35.5 (H) 07/28/2017 1634   MCHC 32.8 01/15/2018 1318   RDW 13.8 01/15/2018 1318   RDW 22.3 (H) 07/27/2017 1414   LYMPHSABS 0.7 01/15/2018 1318   LYMPHSABS 0.4 (L) 07/27/2017 1414   MONOABS 0.8 01/15/2018 1318   MONOABS 0.5 07/27/2017 1414   EOSABS 0.1 01/15/2018 1318   EOSABS 0.0 07/27/2017 1414   BASOSABS 0.0 01/15/2018 1318   BASOSABS 0.0 07/27/2017 1414     .    Assessment & Plan:   Shortness of breath  Graft vs host disease ([place])  COPD with chronic bronchitis and emphysema ([place])  Pulmonary nodules  Discussion: Mehran has been suffering from some weight loss and fatigue and shortness of breath in the last 2-3 months.  We obtained a lung function test which actually shows that his FEV1 is better than when it was last performed at [facility] a year ago.  The only major change in lung function testing has been a worsening diffusion capacity though even now that his diffusion capacity is only mildly abnormal.  We performed a CT scan of his chest which shows mild centrilobular emphysema but no change compared to prior.  The pulmonary nodule showed no changes either.  He does not have specific symptoms to suggest ongoing or worsening COPD aside from the worsening shortness of breath.  Because of his mild airflow obstruction and increased shortness of breath I think it is reasonable to giving the long-acting bronchodilator to see if this helps.  However, I think he needs to be assessed for pulmonary hypertension given the change in diffusion capacity and lack of objective worsening on CT images of his chest.  Finally, given the systemic complaints of weight loss and recent diarrhea I think it is reasonable for his physicians at [facility] to consider whether or not graft-versus-host disease is causing his symptoms.  I certainly do not see clear evidence of worsening airflow obstruction to  suggest specific graft-versus-host disease in the lung.  Plan: Centrilobular emphysema/COPD: Continue taking albuterol as needed for shortness of breath or chest tightness Start using Anoro, we gave you a sample today.  Take this medicine once a day.  If it helps then call me and let me know.  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 Stailey    Current Outpatient Medications:  .  albuterol (PROAIR HFA) 108 (90 Base) MCG/ACT inhaler, Inhale 2 puffs into the lungs every 6 (six) hours as needed for wheezing or shortness of breath., Disp: 1 Inhaler, Rfl: 3 .  calcium carbonate (TUMS - DOSED IN MG ELEMENTAL CALCIUM) 500 MG chewable tablet, Chew 0.5 tablets by mouth 2 (two) times daily. , Disp: , Rfl:  .  Carboxymethylcellulose Sodium (THERATEARS) 0.25 % SOLN, Apply 1 drop to eye 4 (four) times daily., Disp: , Rfl:  .  Cholecalciferol (VITAMIN D-1000 MAX ST) 1000 units tablet, Take 1,000 Units by mouth 2 (two) times daily. , Disp: , Rfl:  .  dapsone 100 MG tablet, Take 100 mg by mouth daily., Disp: , Rfl:  .  diltiazem (CARDIZEM) 60 MG tablet, Take 60 mg by mouth 4 (four) times daily. , Disp: , Rfl:  .  famotidine (PEPCID) 20 MG tablet, Take 20 mg by mouth 2 (two) times daily. , Disp: , Rfl:  .  hydrocortisone 1 % lotion, Apply topically., Disp: , Rfl:  .  loratadine (CLARITIN) 10 MG tablet, Take 10 mg by mouth daily., Disp: , Rfl:  .  predniSONE (DELTASONE) 5 MG tablet, 5mg  alternating with 2.5mg  daily, Disp: , Rfl:  .  tacrolimus (PROGRAF) 0.5 MG  capsule, One (0.5 mg) AM and Two (1 mg) PM, Disp: , Rfl:  .  valACYclovir (VALTREX) 500 MG tablet, TAKE ONE TABLET BY MOUTH TWICE DAILY AT  NIGHT  TO  SUPPRESS  HERPES  GINGIVITIS, Disp: 60 tablet, Rfl: 1 .  umeclidinium-vilanterol (ANORO ELLIPTA) 62.5-25 MCG/INH AEPB, Inhale 1 puff into the lungs daily., Disp: 2 each, Rfl: 0 No current facility-administered medications for this visit.   Facility-Administered Medications Ordered in Other Visits:  .  sodium chloride flush (NS) 0.9 % injection 10 mL, 10 mL, Intravenous, PRN, Brunetta Genera, MD, 10 mL at 07/28/17 1645

## 2018-01-22 NOTE — Patient Instructions (Signed)
Centrilobular emphysema/COPD: Continue taking albuterol as needed for shortness of breath or chest tightness Start using Anoro, we gave you a sample today.  Take this medicine once a day.  If it helps then call me and let me know.  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

## 2018-01-23 ENCOUNTER — Other Ambulatory Visit (HOSPITAL_COMMUNITY): Payer: Self-pay | Admitting: Otolaryngology

## 2018-01-23 DIAGNOSIS — D333 Benign neoplasm of cranial nerves: Secondary | ICD-10-CM

## 2018-01-24 ENCOUNTER — Telehealth: Payer: Self-pay

## 2018-01-24 ENCOUNTER — Other Ambulatory Visit: Payer: Self-pay

## 2018-01-24 ENCOUNTER — Inpatient Hospital Stay: Payer: Medicare Other | Attending: Hematology

## 2018-01-24 DIAGNOSIS — D89813 Graft-versus-host disease, unspecified: Secondary | ICD-10-CM

## 2018-01-24 DIAGNOSIS — C9200 Acute myeloblastic leukemia, not having achieved remission: Secondary | ICD-10-CM | POA: Diagnosis present

## 2018-01-24 LAB — CMP (CANCER CENTER ONLY)
ALBUMIN: 3.3 g/dL — AB (ref 3.5–5.0)
ALK PHOS: 81 U/L (ref 40–150)
ALT: 18 U/L (ref 0–55)
AST: 20 U/L (ref 5–34)
Anion gap: 9 (ref 3–11)
BILIRUBIN TOTAL: 0.8 mg/dL (ref 0.2–1.2)
BUN: 30 mg/dL — AB (ref 7–26)
CALCIUM: 8.9 mg/dL (ref 8.4–10.4)
CO2: 26 mmol/L (ref 22–29)
Chloride: 104 mmol/L (ref 98–109)
Creatinine: 1.58 mg/dL — ABNORMAL HIGH (ref 0.70–1.30)
GFR, Est AFR Am: 47 mL/min — ABNORMAL LOW (ref >60)
GFR, Estimated: 40 mL/min — ABNORMAL LOW (ref >60)
Glucose, Bld: 126 mg/dL (ref 70–140)
Potassium: 4.7 mmol/L (ref 3.5–5.1)
Sodium: 139 mmol/L (ref 136–145)
TOTAL PROTEIN: 6.3 g/dL — AB (ref 6.4–8.3)

## 2018-01-24 LAB — URINALYSIS, COMPLETE (UACMP) WITH MICROSCOPIC
Bacteria, UA: NONE SEEN
Bilirubin Urine: NEGATIVE
Glucose, UA: NEGATIVE mg/dL
Hgb urine dipstick: NEGATIVE
KETONES UR: NEGATIVE mg/dL
Leukocytes, UA: NEGATIVE
Nitrite: NEGATIVE
PH: 6 (ref 5.0–8.0)
Protein, ur: NEGATIVE mg/dL
RBC / HPF: NONE SEEN RBC/hpf (ref 0–5)
SPECIFIC GRAVITY, URINE: 1.008 (ref 1.005–1.030)
Squamous Epithelial / LPF: NONE SEEN

## 2018-01-24 LAB — CBC WITH DIFFERENTIAL (CANCER CENTER ONLY)
BASOS ABS: 0 10*3/uL (ref 0.0–0.1)
BASOS PCT: 0 %
Eosinophils Absolute: 0 10*3/uL (ref 0.0–0.5)
Eosinophils Relative: 0 %
HCT: 35 % — ABNORMAL LOW (ref 38.4–49.9)
HEMOGLOBIN: 11.3 g/dL — AB (ref 13.0–17.1)
Lymphocytes Relative: 8 %
Lymphs Abs: 0.6 10*3/uL — ABNORMAL LOW (ref 0.9–3.3)
MCH: 35.5 pg — ABNORMAL HIGH (ref 27.2–33.4)
MCHC: 32.3 g/dL (ref 32.0–36.0)
MCV: 110.1 fL — ABNORMAL HIGH (ref 79.3–98.0)
Monocytes Absolute: 0.7 10*3/uL (ref 0.1–0.9)
Monocytes Relative: 9 %
NEUTROS PCT: 83 %
Neutro Abs: 6.2 10*3/uL (ref 1.5–6.5)
Platelet Count: 189 10*3/uL (ref 140–400)
RBC: 3.18 MIL/uL — ABNORMAL LOW (ref 4.20–5.82)
RDW: 13.3 % (ref 11.0–14.6)
WBC Count: 7.5 10*3/uL (ref 4.0–10.3)

## 2018-01-24 NOTE — Telephone Encounter (Signed)
Patient called this morning related to symptom from Graft vs. Host. Pt described dark urine, diarrhea, and potential dehydration. Spoke with [facility] staff yesterday who are following him and recommended for Dr. Irene Limbo to order UA and ctn to be tested. Dr. Irene Limbo okay with plan. Pt to come in at 1515 for lab appt. Orders placed for CBC, CMP, and urinalysis with microscopic. Pt to provide fax number for Gaspar Cola to desk RN to share lab results.

## 2018-01-25 ENCOUNTER — Telehealth: Payer: Self-pay

## 2018-01-25 NOTE — Telephone Encounter (Signed)
Pt had graft vs. Host operation through [facility]. Pt called office yesterday morning with concern regarding dark urine, diarrhea, and potential dehydration. Per [facility], pt to get in touch with Dr. Irene Limbo for lab work. CBC, CMP, and UA with microscopic ordered. Results faxed to ATTN: Hamilton Capri [phone] and confirmed fax receipt 01/25/18 at [place].

## 2018-01-26 ENCOUNTER — Other Ambulatory Visit: Admit: 2018-01-26 | Discharge: 2018-01-26 | Payer: MEDICARE

## 2018-01-26 ENCOUNTER — Ambulatory Visit
Admit: 2018-01-26 | Discharge: 2018-01-26 | Payer: MEDICARE | Attending: Hematology & Oncology | Primary: Hematology & Oncology

## 2018-01-26 DIAGNOSIS — Z9484 Stem cells transplant status: Principal | ICD-10-CM

## 2018-01-26 DIAGNOSIS — C9200 Acute myeloblastic leukemia, not having achieved remission: Principal | ICD-10-CM

## 2018-01-26 DIAGNOSIS — D89810 Acute graft-versus-host disease: Secondary | ICD-10-CM

## 2018-01-26 DIAGNOSIS — D899 Disorder involving the immune mechanism, unspecified: Secondary | ICD-10-CM

## 2018-01-26 DIAGNOSIS — D89813 Graft-versus-host disease, unspecified: Secondary | ICD-10-CM

## 2018-01-26 DIAGNOSIS — A0472 Enterocolitis due to Clostridium difficile, not specified as recurrent: Secondary | ICD-10-CM

## 2018-01-26 DIAGNOSIS — I10 Essential (primary) hypertension: Secondary | ICD-10-CM

## 2018-01-29 ENCOUNTER — Ambulatory Visit: Admit: 2018-01-29 | Discharge: 2018-01-29 | Payer: MEDICARE

## 2018-01-29 DIAGNOSIS — A0471 Enterocolitis due to Clostridium difficile, recurrent: Principal | ICD-10-CM

## 2018-01-29 DIAGNOSIS — R0602 Shortness of breath: Principal | ICD-10-CM

## 2018-01-29 DIAGNOSIS — J449 Chronic obstructive pulmonary disease, unspecified: Secondary | ICD-10-CM

## 2018-01-30 ENCOUNTER — Telehealth: Payer: Self-pay | Admitting: Pulmonary Disease

## 2018-01-30 MED ORDER — UMECLIDINIUM-VILANTEROL 62.5-25 MCG/INH IN AEPB: 1.0000 | INHALATION_SPRAY | Freq: Every day | RESPIRATORY_TRACT | 3 refills | Status: AC

## 2018-01-30 NOTE — Telephone Encounter (Signed)
Called and spoke with patient, placed order for patients prescription, nothing further needed.

## 2018-01-31 ENCOUNTER — Ambulatory Visit: Admit: 2018-01-31 | Discharge: 2018-02-01 | Payer: MEDICARE

## 2018-01-31 DIAGNOSIS — H2513 Age-related nuclear cataract, bilateral: Secondary | ICD-10-CM

## 2018-01-31 DIAGNOSIS — H11823 Conjunctivochalasis, bilateral: Principal | ICD-10-CM

## 2018-02-01 ENCOUNTER — Ambulatory Visit (HOSPITAL_COMMUNITY)
Admission: RE | Admit: 2018-02-01 | Discharge: 2018-02-01 | Disposition: A | Discharge status: Home / Self Care | Payer: Medicare Other | Source: Ambulatory Visit | Attending: Otolaryngology | Admitting: Otolaryngology

## 2018-02-01 DIAGNOSIS — D333 Benign neoplasm of cranial nerves: Secondary | ICD-10-CM

## 2018-02-01 DIAGNOSIS — I7389 Other specified peripheral vascular diseases: Secondary | ICD-10-CM | POA: Diagnosis not present

## 2018-02-01 LAB — CREATININE, SERUM
Creatinine, Ser: 1.65 mg/dL — ABNORMAL HIGH (ref 0.61–1.24)
GFR calc non Af Amer: 38 mL/min — ABNORMAL LOW (ref >60)
GFR, EST AFRICAN AMERICAN: 44 mL/min — AB (ref >60)

## 2018-02-01 MED ORDER — GADOBENATE DIMEGLUMINE 529 MG/ML IV SOLN
15.0000 mL | Freq: Once | INTRAVENOUS | Status: AC | PRN
  Administered 2018-02-01: 15 mL via INTRAVENOUS

## 2018-02-05 ENCOUNTER — Telehealth: Payer: Self-pay | Admitting: Pulmonary Disease

## 2018-02-05 NOTE — Telephone Encounter (Signed)
Spoke with patient. He was requesting the results of the echocardiogram that he had done on 01/29/18 at [facility]. Advised patient that we have received the results but BQ has been gone from the office. He wants BQ to review them asap.   He also wanted a copy to be mailed to him. Copy has been placed in the mail.   Results have been placed on BQ's desk.   BQ, please advise. Thanks!

## 2018-02-06 ENCOUNTER — Other Ambulatory Visit: Payer: Self-pay | Admitting: General Surgery

## 2018-02-06 ENCOUNTER — Ambulatory Visit: Payer: Medicare Other | Admitting: Pulmonary Disease

## 2018-02-06 MED ORDER — DESONIDE 0.05 % TOPICAL CREAM
Freq: Two times a day (BID) | TOPICAL | 6 refills | 0.00000 days | Status: CP
Start: 2018-02-06 — End: 2018-02-09

## 2018-02-06 NOTE — Telephone Encounter (Signed)
Spoke with pt. He is aware of results. Nothing further was needed. 

## 2018-02-06 NOTE — Telephone Encounter (Signed)
Please let him know it was normal.

## 2018-02-09 ENCOUNTER — Ambulatory Visit
Admit: 2018-02-09 | Discharge: 2018-02-10 | Payer: MEDICARE | Attending: Hematology & Oncology | Primary: Hematology & Oncology

## 2018-02-09 ENCOUNTER — Other Ambulatory Visit: Admit: 2018-02-09 | Discharge: 2018-02-10 | Payer: MEDICARE

## 2018-02-09 DIAGNOSIS — C9200 Acute myeloblastic leukemia, not having achieved remission: Secondary | ICD-10-CM

## 2018-02-09 DIAGNOSIS — A0471 Enterocolitis due to Clostridium difficile, recurrent: Secondary | ICD-10-CM

## 2018-02-09 DIAGNOSIS — I10 Essential (primary) hypertension: Principal | ICD-10-CM

## 2018-02-09 DIAGNOSIS — D89810 Acute graft-versus-host disease: Principal | ICD-10-CM

## 2018-02-09 DIAGNOSIS — K4090 Unilateral inguinal hernia, without obstruction or gangrene, not specified as recurrent: Secondary | ICD-10-CM

## 2018-02-09 DIAGNOSIS — Z9484 Stem cells transplant status: Secondary | ICD-10-CM

## 2018-02-09 DIAGNOSIS — D89813 Graft-versus-host disease, unspecified: Secondary | ICD-10-CM

## 2018-02-09 DIAGNOSIS — D899 Disorder involving the immune mechanism, unspecified: Secondary | ICD-10-CM

## 2018-02-09 MED ORDER — DESONIDE 0.05 % TOPICAL CREAM
Freq: Two times a day (BID) | TOPICAL | 6 refills | 0 days | Status: CP
Start: 2018-02-09 — End: 2018-03-11

## 2018-02-19 ENCOUNTER — Encounter (HOSPITAL_BASED_OUTPATIENT_CLINIC_OR_DEPARTMENT_OTHER): Payer: Self-pay | Admitting: *Deleted

## 2018-02-19 ENCOUNTER — Other Ambulatory Visit: Payer: Self-pay

## 2018-02-19 ENCOUNTER — Encounter (HOSPITAL_BASED_OUTPATIENT_CLINIC_OR_DEPARTMENT_OTHER)
Admission: RE | Admit: 2018-02-19 | Discharge: 2018-02-19 | Disposition: A | Discharge status: Home / Self Care | Payer: Medicare Other | Source: Ambulatory Visit | Attending: General Surgery | Admitting: General Surgery

## 2018-02-19 DIAGNOSIS — I2510 Atherosclerotic heart disease of native coronary artery without angina pectoris: Secondary | ICD-10-CM | POA: Insufficient documentation

## 2018-02-19 DIAGNOSIS — I5032 Chronic diastolic (congestive) heart failure: Secondary | ICD-10-CM | POA: Insufficient documentation

## 2018-02-19 DIAGNOSIS — Z79899 Other long term (current) drug therapy: Secondary | ICD-10-CM | POA: Diagnosis not present

## 2018-02-19 DIAGNOSIS — D89813 Graft-versus-host disease, unspecified: Secondary | ICD-10-CM | POA: Diagnosis not present

## 2018-02-19 DIAGNOSIS — Z7952 Long term (current) use of systemic steroids: Secondary | ICD-10-CM | POA: Diagnosis not present

## 2018-02-19 DIAGNOSIS — E785 Hyperlipidemia, unspecified: Secondary | ICD-10-CM | POA: Insufficient documentation

## 2018-02-19 DIAGNOSIS — J449 Chronic obstructive pulmonary disease, unspecified: Secondary | ICD-10-CM | POA: Insufficient documentation

## 2018-02-19 DIAGNOSIS — D649 Anemia, unspecified: Secondary | ICD-10-CM | POA: Insufficient documentation

## 2018-02-19 DIAGNOSIS — G4733 Obstructive sleep apnea (adult) (pediatric): Secondary | ICD-10-CM | POA: Insufficient documentation

## 2018-02-19 DIAGNOSIS — I110 Hypertensive heart disease with heart failure: Secondary | ICD-10-CM | POA: Insufficient documentation

## 2018-02-19 LAB — CBC WITH DIFFERENTIAL/PLATELET
Basophils Absolute: 0 10*3/uL (ref 0.0–0.1)
Basophils Relative: 1 %
Eosinophils Absolute: 0.2 10*3/uL (ref 0.0–0.7)
Eosinophils Relative: 2 %
HCT: 33.7 % — ABNORMAL LOW (ref 39.0–52.0)
HEMOGLOBIN: 10.8 g/dL — AB (ref 13.0–17.0)
LYMPHS ABS: 0.7 10*3/uL (ref 0.7–4.0)
LYMPHS PCT: 8 %
MCH: 35 pg — ABNORMAL HIGH (ref 26.0–34.0)
MCHC: 32 g/dL (ref 30.0–36.0)
MCV: 109.1 fL — AB (ref 78.0–100.0)
Monocytes Absolute: 0.9 10*3/uL (ref 0.1–1.0)
Monocytes Relative: 10 %
NEUTROS ABS: 6.8 10*3/uL (ref 1.7–7.7)
NEUTROS PCT: 79 %
Platelets: 186 10*3/uL (ref 150–400)
RBC: 3.09 MIL/uL — AB (ref 4.22–5.81)
RDW: 15 % (ref 11.5–15.5)
WBC: 8.5 10*3/uL (ref 4.0–10.5)

## 2018-02-19 LAB — COMPREHENSIVE METABOLIC PANEL
ALK PHOS: 73 U/L (ref 38–126)
ALT: 21 U/L (ref 17–63)
AST: 27 U/L (ref 15–41)
Albumin: 3.4 g/dL — ABNORMAL LOW (ref 3.5–5.0)
Anion gap: 7 (ref 5–15)
BUN: 29 mg/dL — AB (ref 6–20)
CALCIUM: 8.5 mg/dL — AB (ref 8.9–10.3)
CO2: 26 mmol/L (ref 22–32)
CREATININE: 1.69 mg/dL — AB (ref 0.61–1.24)
Chloride: 106 mmol/L (ref 101–111)
GFR, EST AFRICAN AMERICAN: 43 mL/min — AB (ref >60)
GFR, EST NON AFRICAN AMERICAN: 37 mL/min — AB (ref >60)
Glucose, Bld: 120 mg/dL — ABNORMAL HIGH (ref 65–99)
Potassium: 4.7 mmol/L (ref 3.5–5.1)
Sodium: 139 mmol/L (ref 135–145)
Total Bilirubin: 1.2 mg/dL (ref 0.3–1.2)
Total Protein: 5.8 g/dL — ABNORMAL LOW (ref 6.5–8.1)

## 2018-02-19 LAB — APTT: aPTT: 30 seconds (ref 24–36)

## 2018-02-19 LAB — PROTIME-INR
INR: 1.11
PROTHROMBIN TIME: 14.2 s (ref 11.4–15.2)

## 2018-02-19 NOTE — Pre-Procedure Instructions (Addendum)
PT in for PAT appt,

## 2018-02-20 NOTE — Progress Notes (Signed)
Lab results reviewed by Dr. Turk, will proceed with surgery as scheduled. 

## 2018-02-26 NOTE — H&P (Signed)
Warren [name] Location: [facility] Patient #: [id number] [phone] DOB: January 30, 1941 Married / Language: English / Race: White Male        History of Present Illness       The patient is a 77 year old male who presents with an inguinal hernia. This is a pleasant 77 year old gentleman, referred by Dr. Wenda Low for elective evaluation of right inguinal hernia with some discomfort. Dr. Juliette Mangle his local oncologist. Dr. Lake Bells is his pulmonary medicine doctor. Dr. Fransico Him has seen him a time or 2 but he doesn't have any major cardiac problems      Significant history is AML diagnosed over a year ago. He's had a bone marrow transplant at [facility] which has been successful. He is still on low-dose prednisone and tacrolimus. He lost 45 pounds but has gained 20 pounds back. He is walking 3 miles a day now No prior history of hernia but noticed a bulge in his right groin about 6 months ago. He has moderate discomfort but no incarceration. He is interested in having this repaired maybe in April after he comes down off his renal suppresses but before his summer activities with flap fishing.      Comorbidities include 45 pound weight loss. 20 pounds gained back. Immunosuppressed due to bone marrow transplant and AML. COPD. History chest trauma with pneumothorax and hemothorax many years ago. DJD. Had open radical retropubic prostatectomy 2001 for prostate cancer. In remission. Acoustic neuroma resection 1999. C. difficile colitis resolve following fecal transplant 2018. 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 He forward outdoor activities to summer. Married. Quit smoking 1997. Has 1 or 2 drinks of alcohol a day.     I spent A Long Time Discussing Hernia Repair with Him. We Reviewed the Different Techniques. I'm Leaning toward Open Repair since She Has a Long Lower Midline Incision from Radical Retropubic  Prostatectomy. He Is Okay with That. I went over the patient information booklet with him He wants to have this repaired in the spring He will call me back and possibly schedule surgery in April We will plan open repair with mesh. I discussed the indications, details, techniques, and numerous risk of the surgery with him. He is aware of the risk of bleeding, infection, recurrence, nerve damage chronic pain, injury to the testicle or bladder, and other unforeseen problems. He knows that his wound healing may be slightly compromised by his immunosuppressive medication but maybe he'll be off of that by then. He understands all these issues. All of his questions were answered. He agrees with this plan.   Past Surgical History  Colon Polyp Removal - Colonoscopy  Prostate Surgery - Removal  Shoulder Surgery  Left. TURP   Diagnostic Studies History  Colonoscopy  within last year  Allergies  Versed *HYPNOTICS/SEDATIVES/SLEEP DISORDER AGENTS*  Allergies Reconciled   Medication History  Dapsone (100MG  Tablet, Oral) Active. Loratadine (10MG  Tablet, Oral) Active. Cholecalciferol (1000UNIT Tablet, Oral) Active. Desonide (0.05% Cream, External) Active. DilTIAZem HCl (60MG  Tablet, Oral) Active. Famotidine (20MG  Tablet, Oral) Active. Imodium (2MG  Capsule, Oral) Active. PredniSONE (5MG  Tablet, Oral) Active. Tacrolimus (0.5MG  Capsule, Oral) Active. ValACYclovir HCl (500MG  Tablet, Oral) Active. Medications Reconciled  Social History  Alcohol use  Occasional alcohol use. Caffeine use  Coffee, Tea. No drug use  Tobacco use  Former smoker.  Family History  Breast Cancer  Sister. Heart Disease  Brother, Mother, Sister. Hypertension  Brother, Mother, Sister. Melanoma  Brother.  Prostate Cancer  Brother. Respiratory Condition  Father.  Other Problems  Arthritis  Back Pain  Cancer  Chronic Obstructive Lung Disease  Diverticulosis  Enlarged Prostate   Gastroesophageal Reflux Disease  General anesthesia - complications  Hemorrhoids  High blood pressure  Hypercholesterolemia  Inguinal Hernia  Melanoma  Prostate Cancer  Transfusion history     Review of Systems  General Not Present- Appetite Loss, Chills, Fatigue, Fever, Night Sweats, Weight Gain and Weight Loss. Skin Present- Dryness and Rash. Not Present- Change in Wart/Mole, Hives, Jaundice, New Lesions, Non-Healing Wounds and Ulcer. HEENT Present- Hearing Loss, Oral Ulcers, Seasonal Allergies and Wears glasses/contact lenses. Not Present- Earache, Hoarseness, Nose Bleed, Ringing in the Ears, Sinus Pain, Sore Throat, Visual Disturbances and Yellow Eyes. Respiratory Present- Snoring. Not Present- Bloody sputum, Chronic Cough, Difficulty Breathing and Wheezing. Cardiovascular Not Present- Chest Pain, Difficulty Breathing Lying Down, Leg Cramps, Palpitations, Rapid Heart Rate, Shortness of Breath and Swelling of Extremities. Gastrointestinal Present- Abdominal Pain and Excessive gas. Not Present- Bloating, Bloody Stool, Change in Bowel Habits, Chronic diarrhea, Constipation, Difficulty Swallowing, Gets full quickly at meals, Hemorrhoids, Indigestion, Nausea, Rectal Pain and Vomiting. Male Genitourinary Not Present- Blood in Urine, Change in Urinary Stream, Frequency, Impotence, Nocturia, Painful Urination, Urgency and Urine Leakage. Musculoskeletal Present- Back Pain. Not Present- Joint Pain, Joint Stiffness, Muscle Pain, Muscle Weakness and Swelling of Extremities. Neurological Not Present- Decreased Memory, Fainting, Headaches, Numbness, Seizures, Tingling, Tremor, Trouble walking and Weakness. Psychiatric Not Present- Anxiety, Bipolar, Change in Sleep Pattern, Depression, Fearful and Frequent crying. Endocrine Not Present- Cold Intolerance, Excessive Hunger, Hair Changes, Heat Intolerance, Hot flashes and New Diabetes. Hematology Present- Easy Bruising. Not Present- Blood  Thinners, Excessive bleeding, Gland problems, HIV and Persistent Infections.  Vitals  Weight: 149.2 lb Height: 68in Body Surface Area: 1.8 m Body Mass Index: 22.69 kg/m  Temp.: 97.43F  Pulse: 84 (Regular)  BP: 118/82 (Sitting, Left Arm, Standard)    Physical Exam  General Mental Status-Alert. General Appearance-Consistent with stated age. Hydration-Well hydrated. Voice-Normal.  Head and Neck Head-normocephalic, atraumatic with no lesions or palpable masses. Trachea-midline. Thyroid Gland Characteristics - normal size and consistency.  Eye Eyeball - Bilateral-Extraocular movements intact. Sclera/Conjunctiva - Bilateral-No scleral icterus.  Chest and Lung Exam Chest and lung exam reveals -quiet, even and easy respiratory effort with no use of accessory muscles and on auscultation, normal breath sounds, no adventitious sounds and normal vocal resonance. Inspection Chest Wall - Normal. Back - normal.  Cardiovascular Cardiovascular examination reveals -normal heart sounds, regular rate and rhythm with no murmurs and normal pedal pulses bilaterally.  Abdomen Inspection Inspection of the abdomen reveals - No Hernias. Skin - Scar - Note: Long lower midline scar without hernia. No palpable mass. Umbilicus feels normal. Palpation/Percussion Palpation and Percussion of the abdomen reveal - Soft, Non Tender, No Rebound tenderness, No Rigidity (guarding) and No hepatosplenomegaly. Auscultation Auscultation of the abdomen reveals - Bowel sounds normal.  Male Genitourinary Note: Examined supine and standing. He has a small to medium size right inguinal hernia that protrudes when he stands and coughs. Easily reducible. No hernia on the left. No femoral mass or adenopathy.   Neurologic Neurologic evaluation reveals -alert and oriented x 3 with no impairment of recent or remote memory. Mental Status-Normal.  Musculoskeletal Normal Exam -  Left-Upper Extremity Strength Normal and Lower Extremity Strength Normal. Normal Exam - Right-Upper Extremity Strength Normal and Lower Extremity Strength Normal.  Lymphatic Head & Neck  General Head & Neck Lymphatics: Bilateral - Description -  Normal. Axillary  General Axillary Region: Bilateral - Description - Normal. Tenderness - Non Tender. Femoral & Inguinal  Generalized Femoral & Inguinal Lymphatics: Bilateral - Description - Normal. Tenderness - Non Tender.    Assessment & Plan  RIGHT INGUINAL HERNIA (K40.90)  You have a reducible right inguinal hernia There is no evidence of hernia on the left side or at the umbilicus This is beginning to cause some discomfort when you are physically active At some point this will need to be repaired although there is no emergency  You are still taking prednisone and tacrolimus because of your bone marrow transplant for AML.  you state that you might want to have this repaired in April so that you can be recovered for summer activities That is reasonable We have discussed the indications, techniques, and risks of the surgery in detail. Please review the patient information booklet that I reviewed with you Given [place] a call one month before the intended date of surgery   HISTORY OF ACUTE MYELOID LEUKEMIA (Z85.6) HISTORY OF STEM CELL TRANSPLANT (Z94.84) H/O RADICAL RETROPUBIC PROSTATECTOMY (Z90.79) Impression: Prostate cancer. PCP follows [facility] now COPD, MILD (J44.9) HISTORY OF ACOUSTIC NEUROMA (Z86.018) HISTORY OF CLOSTRIDIUM DIFFICILE COLITIS (Z86.19) Impression: Resolved following fecal transplant. Occurred during treatment for AML.    Edsel Petrin. Dalbert Batman, M.D., [facility] General and Minimally invasive Surgery Breast and Colorectal Surgery Office:   [phone] Pager:   [phone]

## 2018-02-27 ENCOUNTER — Ambulatory Visit (HOSPITAL_BASED_OUTPATIENT_CLINIC_OR_DEPARTMENT_OTHER)
Admission: RE | Admit: 2018-02-27 | Discharge: 2018-02-27 | Disposition: A | Discharge status: Home / Self Care | Payer: Medicare Other | Source: Ambulatory Visit | Attending: General Surgery | Admitting: General Surgery

## 2018-02-27 ENCOUNTER — Ambulatory Visit (HOSPITAL_BASED_OUTPATIENT_CLINIC_OR_DEPARTMENT_OTHER): Payer: Medicare Other | Admitting: Anesthesiology

## 2018-02-27 ENCOUNTER — Encounter (HOSPITAL_BASED_OUTPATIENT_CLINIC_OR_DEPARTMENT_OTHER): Payer: Self-pay

## 2018-02-27 ENCOUNTER — Encounter (HOSPITAL_BASED_OUTPATIENT_CLINIC_OR_DEPARTMENT_OTHER)
Admission: RE | Disposition: A | Discharge status: Home / Self Care | Payer: Self-pay | Source: Ambulatory Visit | Attending: General Surgery

## 2018-02-27 ENCOUNTER — Other Ambulatory Visit: Payer: Self-pay

## 2018-02-27 DIAGNOSIS — N5089 Other specified disorders of the male genital organs: Secondary | ICD-10-CM | POA: Insufficient documentation

## 2018-02-27 DIAGNOSIS — K4090 Unilateral inguinal hernia, without obstruction or gangrene, not specified as recurrent: Secondary | ICD-10-CM | POA: Diagnosis present

## 2018-02-27 HISTORY — PX: INGUINAL HERNIA REPAIR: SHX194

## 2018-02-27 HISTORY — DX: Acute myeloblastic leukemia, not having achieved remission: C92.00

## 2018-02-27 HISTORY — DX: Unilateral inguinal hernia, without obstruction or gangrene, not specified as recurrent: K40.90

## 2018-02-27 HISTORY — PX: INSERTION OF MESH: SHX5868

## 2018-02-27 SURGERY — REPAIR, HERNIA, INGUINAL, ADULT
Anesthesia: General | Site: Groin | Laterality: Right

## 2018-02-27 MED ORDER — FENTANYL CITRATE (PF) 100 MCG/2ML IJ SOLN
INTRAMUSCULAR | Status: AC
  Filled 2018-02-27: qty 2

## 2018-02-27 MED ORDER — EPHEDRINE SULFATE 50 MG/ML IJ SOLN
INTRAMUSCULAR | Status: DC | PRN
  Administered 2018-02-27: 10 mg via INTRAVENOUS

## 2018-02-27 MED ORDER — HYDROCODONE-ACETAMINOPHEN 5-325 MG PO TABS: 1.0000 | ORAL_TABLET | Freq: Four times a day (QID) | ORAL | 0 refills | Status: AC | PRN

## 2018-02-27 MED ORDER — 0.9 % SODIUM CHLORIDE (POUR BTL) OPTIME
TOPICAL | Status: DC | PRN
  Administered 2018-02-27: 1000 mL

## 2018-02-27 MED ORDER — GABAPENTIN 300 MG PO CAPS
300.0000 mg | ORAL_CAPSULE | ORAL | Status: AC
  Administered 2018-02-27: 300 mg via ORAL

## 2018-02-27 MED ORDER — CHLORHEXIDINE GLUCONATE CLOTH 2 % EX PADS: 6.0000 | MEDICATED_PAD | Freq: Once | CUTANEOUS | Status: DC

## 2018-02-27 MED ORDER — MIDAZOLAM HCL 2 MG/2ML IJ SOLN
INTRAMUSCULAR | Status: AC
Start: 2018-02-27 — End: ?
  Filled 2018-02-27: qty 2

## 2018-02-27 MED ORDER — FENTANYL CITRATE (PF) 100 MCG/2ML IJ SOLN
50.0000 ug | INTRAMUSCULAR | Status: DC | PRN
  Administered 2018-02-27: 100 ug via INTRAVENOUS
  Administered 2018-02-27: 50 ug via INTRAVENOUS

## 2018-02-27 MED ORDER — SCOPOLAMINE 1 MG/3DAYS TD PT72: 1.0000 | MEDICATED_PATCH | Freq: Once | TRANSDERMAL | Status: DC | PRN

## 2018-02-27 MED ORDER — LACTATED RINGERS IV SOLN
INTRAVENOUS | Status: DC
  Administered 2018-02-27 (×2): via INTRAVENOUS

## 2018-02-27 MED ORDER — ACETAMINOPHEN 500 MG PO TABS
ORAL_TABLET | ORAL | Status: AC
  Filled 2018-02-27: qty 2

## 2018-02-27 MED ORDER — OXYCODONE HCL 5 MG PO TABS
ORAL_TABLET | ORAL | Status: AC
  Filled 2018-02-27: qty 1

## 2018-02-27 MED ORDER — OXYCODONE HCL 5 MG PO TABS
5.0000 mg | ORAL_TABLET | ORAL | Status: DC | PRN
  Administered 2018-02-27: 5 mg via ORAL

## 2018-02-27 MED ORDER — ACETAMINOPHEN 500 MG PO TABS
1000.0000 mg | ORAL_TABLET | ORAL | Status: AC
  Administered 2018-02-27: 1000 mg via ORAL

## 2018-02-27 MED ORDER — ACETAMINOPHEN 650 MG RE SUPP
650.0000 mg | RECTAL | Status: DC | PRN
Start: 2018-02-27 — End: 2018-02-27

## 2018-02-27 MED ORDER — PROPOFOL 10 MG/ML IV BOLUS
INTRAVENOUS | Status: DC | PRN
  Administered 2018-02-27: 150 mg via INTRAVENOUS

## 2018-02-27 MED ORDER — SODIUM CHLORIDE 0.9% FLUSH: 3.0000 mL | INTRAVENOUS | Status: DC | PRN

## 2018-02-27 MED ORDER — CEFAZOLIN SODIUM-DEXTROSE 2-4 GM/100ML-% IV SOLN
2.0000 g | INTRAVENOUS | Status: AC
  Administered 2018-02-27: 2 g via INTRAVENOUS

## 2018-02-27 MED ORDER — FENTANYL CITRATE (PF) 100 MCG/2ML IJ SOLN
25.0000 ug | INTRAMUSCULAR | Status: DC | PRN
  Administered 2018-02-27: 25 ug via INTRAVENOUS

## 2018-02-27 MED ORDER — GABAPENTIN 300 MG PO CAPS
ORAL_CAPSULE | ORAL | Status: AC
  Filled 2018-02-27: qty 1

## 2018-02-27 MED ORDER — MIDAZOLAM HCL 2 MG/2ML IJ SOLN: 1.0000 mg | INTRAMUSCULAR | Status: DC | PRN

## 2018-02-27 MED ORDER — CEFAZOLIN SODIUM-DEXTROSE 2-4 GM/100ML-% IV SOLN
INTRAVENOUS | Status: AC
  Filled 2018-02-27: qty 100

## 2018-02-27 MED ORDER — DEXAMETHASONE SODIUM PHOSPHATE 4 MG/ML IJ SOLN
INTRAMUSCULAR | Status: DC | PRN
  Administered 2018-02-27: 10 mg via INTRAVENOUS

## 2018-02-27 MED ORDER — SUGAMMADEX SODIUM 200 MG/2ML IV SOLN
INTRAVENOUS | Status: DC | PRN
  Administered 2018-02-27: 200 mg via INTRAVENOUS

## 2018-02-27 MED ORDER — PROMETHAZINE HCL 25 MG/ML IJ SOLN: 6.2500 mg | INTRAMUSCULAR | Status: DC | PRN

## 2018-02-27 MED ORDER — SODIUM CHLORIDE 0.9% FLUSH: 3.0000 mL | Freq: Two times a day (BID) | INTRAVENOUS | Status: DC

## 2018-02-27 MED ORDER — EPHEDRINE 5 MG/ML INJ
INTRAVENOUS | Status: AC
Start: 2018-02-27 — End: ?
  Filled 2018-02-27: qty 10

## 2018-02-27 MED ORDER — SODIUM CHLORIDE 0.9 % IV SOLN: 250.0000 mL | INTRAVENOUS | Status: DC | PRN

## 2018-02-27 MED ORDER — ACETAMINOPHEN 325 MG PO TABS: 650.0000 mg | ORAL_TABLET | ORAL | Status: DC | PRN

## 2018-02-27 MED ORDER — LIDOCAINE HCL (CARDIAC) 20 MG/ML IV SOLN
INTRAVENOUS | Status: DC | PRN
  Administered 2018-02-27: 100 mg via INTRAVENOUS

## 2018-02-27 MED ORDER — FENTANYL CITRATE (PF) 100 MCG/2ML IJ SOLN: 25.0000 ug | INTRAMUSCULAR | Status: DC | PRN

## 2018-02-27 MED ORDER — ONDANSETRON HCL 4 MG/2ML IJ SOLN
INTRAMUSCULAR | Status: DC | PRN
  Administered 2018-02-27: 4 mg via INTRAVENOUS

## 2018-02-27 MED ORDER — LACTATED RINGERS IV SOLN: INTRAVENOUS | Status: DC

## 2018-02-27 MED ORDER — ROCURONIUM BROMIDE 100 MG/10ML IV SOLN
INTRAVENOUS | Status: DC | PRN
  Administered 2018-02-27: 50 mg via INTRAVENOUS

## 2018-02-27 MED ORDER — BUPIVACAINE-EPINEPHRINE (PF) 0.5% -1:200000 IJ SOLN
INTRAMUSCULAR | Status: DC | PRN
  Administered 2018-02-27: 30 mL via PERINEURAL

## 2018-02-27 SURGICAL SUPPLY — 57 items
ADH SKN CLS APL DERMABOND .7 (GAUZE/BANDAGES/DRESSINGS) ×1
BLADE CLIPPER SURG (BLADE) ×2 IMPLANT
BLADE HEX COATED 2.75 (ELECTRODE) ×3 IMPLANT
BLADE SURG 10 STRL SS (BLADE) ×3 IMPLANT
CANISTER SUCT 1200ML W/VALVE (MISCELLANEOUS) ×3 IMPLANT
CHLORAPREP W/TINT 26ML (MISCELLANEOUS) ×3 IMPLANT
CLOSURE WOUND 1/2 X4 (GAUZE/BANDAGES/DRESSINGS)
COVER BACK TABLE 60X90IN (DRAPES) ×6 IMPLANT
COVER MAYO STAND STRL (DRAPES) ×3 IMPLANT
DECANTER SPIKE VIAL GLASS SM (MISCELLANEOUS) IMPLANT
DERMABOND ADVANCED (GAUZE/BANDAGES/DRESSINGS) ×2
DERMABOND ADVANCED .7 DNX12 (GAUZE/BANDAGES/DRESSINGS) IMPLANT
DRAIN PENROSE 1/2X12 LTX STRL (WOUND CARE) ×3 IMPLANT
DRAPE LAPAROTOMY 100X72 PEDS (DRAPES) ×3 IMPLANT
DRAPE UTILITY XL STRL (DRAPES) ×3 IMPLANT
ELECT REM PT RETURN 9FT ADLT (ELECTROSURGICAL) ×3
ELECTRODE REM PT RTRN 9FT ADLT (ELECTROSURGICAL) ×1 IMPLANT
GAUZE SPONGE 4X4 12PLY STRL LF (GAUZE/BANDAGES/DRESSINGS) IMPLANT
GLOVE EUDERMIC 7 POWDERFREE (GLOVE) ×3 IMPLANT
GOWN STRL REUS W/ TWL LRG LVL3 (GOWN DISPOSABLE) ×1 IMPLANT
GOWN STRL REUS W/ TWL XL LVL3 (GOWN DISPOSABLE) ×1 IMPLANT
GOWN STRL REUS W/TWL LRG LVL3 (GOWN DISPOSABLE) ×3
GOWN STRL REUS W/TWL XL LVL3 (GOWN DISPOSABLE) ×3
MESH ULTRAPRO 3X6 7.6X15CM (Mesh General) ×2 IMPLANT
NEEDLE HYPO 22GX1.5 SAFETY (NEEDLE) ×3 IMPLANT
NS IRRIG 1000ML POUR BTL (IV SOLUTION) ×3 IMPLANT
PACK BASIN DAY SURGERY FS (CUSTOM PROCEDURE TRAY) ×3 IMPLANT
PENCIL BUTTON HOLSTER BLD 10FT (ELECTRODE) ×3 IMPLANT
SLEEVE SCD COMPRESS KNEE MED (MISCELLANEOUS) IMPLANT
SPONGE LAP 4X18 X RAY DECT (DISPOSABLE) IMPLANT
STAPLER VISISTAT 35W (STAPLE) IMPLANT
STRIP CLOSURE SKIN 1/2X4 (GAUZE/BANDAGES/DRESSINGS) IMPLANT
SUT MNCRL AB 4-0 PS2 18 (SUTURE) ×3 IMPLANT
SUT PROLENE 1 CT (SUTURE) IMPLANT
SUT PROLENE 2 0 CT2 30 (SUTURE) ×8 IMPLANT
SUT SILK 2 0 SH (SUTURE) IMPLANT
SUT SILK 2 0 TIES 17X18 (SUTURE) ×3
SUT SILK 2-0 18XBRD TIE BLK (SUTURE) ×1 IMPLANT
SUT SILK 3 0 SH 30 (SUTURE) IMPLANT
SUT VIC AB 2-0 CT1 27 (SUTURE)
SUT VIC AB 2-0 CT1 TAPERPNT 27 (SUTURE) IMPLANT
SUT VIC AB 2-0 SH 27 (SUTURE) ×6
SUT VIC AB 2-0 SH 27XBRD (SUTURE) ×1 IMPLANT
SUT VIC AB 3-0 54X BRD REEL (SUTURE) IMPLANT
SUT VIC AB 3-0 BRD 54 (SUTURE)
SUT VIC AB 3-0 FS2 27 (SUTURE) IMPLANT
SUT VIC AB 3-0 SH 27 (SUTURE) ×3
SUT VIC AB 3-0 SH 27X BRD (SUTURE) ×1 IMPLANT
SUT VICRYL 3-0 CR8 SH (SUTURE) IMPLANT
SYR 10ML LL (SYRINGE) ×3 IMPLANT
SYR 20CC LL (SYRINGE) ×1 IMPLANT
SYR BULB 3OZ (MISCELLANEOUS) ×3 IMPLANT
TOWEL OR NON WOVEN STRL DISP B (DISPOSABLE) ×3 IMPLANT
TRAY DSU PREP LF (CUSTOM PROCEDURE TRAY) ×3 IMPLANT
TUBE CONNECTING 20'X1/4 (TUBING) ×1
TUBE CONNECTING 20X1/4 (TUBING) ×2 IMPLANT
YANKAUER SUCT BULB TIP NO VENT (SUCTIONS) ×3 IMPLANT

## 2018-02-27 NOTE — Progress Notes (Signed)
Assisted Dr. Tobias Alexander with right, ultrasound guided, transabdominal plane block. Side rails up, monitors on throughout procedure. See vital signs in flow sheet. Tolerated Procedure well.

## 2018-02-27 NOTE — Op Note (Signed)
Patient Name:           Warren [name]   Date of Surgery:        02/27/2018  Pre op Diagnosis:      Right inguinal hernia  Post op Diagnosis:    Right inguinal hernia  Procedure:                 Open repair right inguinal hernia with mesh Karl Pock repair)                                      Excision 7 mm spermatic cord mass, fat necrosis suspected  Surgeon:                     Edsel Petrin. Dalbert Batman, M.D., FACS  Assistant:                      OR staff  Operative Indications:       This is a pleasant 77 year old gentleman, referred by Dr. Wenda Low for elective evaluation of right inguinal hernia with some discomfort. Dr. Juliette Mangle his local oncologist. Dr. Lake Bells is his pulmonary medicine doctor. Dr. Fransico Him has seen him  but he doesn't have any major cardiac problems      Significant history is AML diagnosed over a year ago. He's had a bone marrow transplant at [facility] which has been successful. He is still on low-dose prednisone and tacrolimus. He lost 45 pounds but has gained 20 pounds back. He is walking 3 miles a day now No prior history of hernia but noticed a bulge in his right groin about 6 months ago. He has moderate discomfort but no incarceration.      Comorbidities include 45 pound weight loss. 20 pounds gained back. Immunosuppressed due to bone marrow transplant and AML. COPD. History chest trauma with pneumothorax and hemothorax many years ago. DJD. Had open radical retropubic prostatectomy 2001 for prostate cancer. In remission. Acoustic neuroma resection 1999. C. difficile colitis resolve following fecal transplant 2018.. We will plan open repair with mesh. He agrees with this plan.    Operative Findings:       He had an indirect right inguinal hernia.  The indirect sac was empty. There was a 7 mm mass of the cement spermatic cord that looked like fat necrosis.  This was sent for pathology.  The floor of the inguinal canal was quite weak.  The floor of the  anal canal was repaired with a 3" x 6" piece of ultra Pro mesh.  Procedure in Detail:          Right TAPP be block was performed by the anesthesiologist.  The abdomen and genitalia were prepped and draped in a sterile fashion after the induction of general endotracheal anesthesia.  Surgical timeout was performed.  Intravenous antibiotics were given.  0.5% Marcaine with epinephrine was infiltrated into the skin and subcutaneous tissue.     A transverse incision was made in the right groin overlying the inguinal canal.  Dissection was carried down to the external oblique.  The external oblique was incised in the direction of its fibers, opening of the external ring.  The external oblique was dissected away from the underlying tissues and self-retaining retractors were placed.  The cord structures were mobilized and encircled with a Penrose drain.  Cremasteric muscle fibers were skeletonized.  I dissected out  the indirect sac.  The sac was opened and found to be empty.  I palpated through the sac into the abdomen.  The femoral vessels were soft.  There was no femoral hernia.  The indirect sac was then twisted and suture ligated at the level of the internal ring with a 2-0 Vicryl suture ligature.  The redundant sac was excised and discarded.        There was a tiny nodule associated with cord structures which looked like fat necrosis.  This was excised and sent for routine histology.        Hemostasis was excellent.  The floor of the inguinal canal was repaired and reinforced with a 3" x 6" piece of ultra Pro mesh.  The mesh was trimmed the corners to accommodate the anatomy of the wound.  The mesh was sutured in place with running sutures of 2-0 Prolene and interrupted mattress sutures of 2-0 Prolene.  The mesh was sutured so as to generously overlap the fascia at the pubic tubercle, then along the inguinal ligament inferiorly.  Medially, superiorly, and superiolaterally several mattress sutures of Prolene were  placed.  The mesh was incised laterally so as to wraparound the cord structures at the internal ring.  The tails of the mesh were overlapped laterally and further sutures were placed laterally.  This provided very secure repair both medial and lateral to the internal ring but allowed an adequate fingertip opening for the cord structures.    The wound was irrigated.  The repair looked good.  The external oblique was closed with a running suture of 2-0 Vicryl placing the cord structures deep to the external oblique.  Scarpa's fascia was closed with 3-0 Vicryl sutures and the skin closed with a running subcuticular 4-0 Monocryl and Dermabond.  The patient tolerated the procedure well was taken to PACU in stable condition.  EBL 10 mL.  Counts correct.  Complications none.    Addendum: I logged onto the [employer] and reviewed his prescription medication history.     Edsel Petrin. Dalbert Batman, M.D., FACS General and Minimally Invasive Surgery Breast and Colorectal Surgery  02/27/2018 12:41 PM

## 2018-02-27 NOTE — Anesthesia Procedure Notes (Signed)
Anesthesia Regional Block: TAP block   Pre-Anesthetic Checklist: ,, timeout performed, Correct Patient, Correct Site, Correct Laterality, Correct Procedure, Correct Position, site marked, Risks and benefits discussed,  Surgical consent,  Pre-op evaluation,  At surgeon's request and post-op pain management  Laterality: Right  Prep: chloraprep       Needles:  Injection technique: Single-shot  Needle Type: Echogenic Stimulator Needle     Needle Length: 10cm  Needle Gauge: 21     Additional Needles:   Narrative:  Start time: 02/27/2018 11:14 AM End time: 02/27/2018 11:24 AM Injection made incrementally with aspirations every 5 mL.  Performed by: Personally

## 2018-02-27 NOTE — Discharge Instructions (Signed)
[facility] _______[facility]  UMBILICAL OR INGUINAL HERNIA REPAIR: POST OP INSTRUCTIONS  Always review your discharge instruction sheet given to you by the facility where your surgery was performed. IF YOU HAVE DISABILITY OR FAMILY LEAVE FORMS, YOU MUST BRING THEM TO THE OFFICE FOR PROCESSING.   DO NOT GIVE THEM TO YOUR DOCTOR.  1. A  prescription for pain medication may be given to you upon discharge.  Take your pain medication as prescribed, if needed.  If narcotic pain medicine is not needed, then you may take acetaminophen (Tylenol) or ibuprofen (Advil) as needed. Last Dose of Tylenol taken at 10:35AM 2. Take your usually prescribed medications unless otherwise directed. If you need a refill on your pain medication, please contact your pharmacy.  They will contact our office to request authorization. Prescriptions will not be filled after 5 pm or on week-ends. 3. You should follow a light diet the first 24 hours after arrival home, such as soup and crackers, etc.  Be sure to include lots of fluids daily.  Resume your normal diet the day after surgery. 4.Most patients will experience some swelling and bruising around the umbilicus or in the groin and scrotum.  Ice packs and reclining will help.  Swelling and bruising can take several days to resolve.  6. It is common to experience some constipation if taking pain medication after surgery.  Increasing fluid intake and taking a stool softener (such as Colace) will usually help or prevent this problem from occurring.  A mild laxative (Milk of Magnesia or Miralax) should be taken according to package directions if there are no bowel movements after 48 hours. 7. Unless discharge instructions indicate otherwise, you may remove your bandages 24-48 hours after surgery, and you may shower at that time.  You may have steri-strips (small skin tapes) in place directly over the incision.  These strips should be left on the skin for 7-10 days.  If your  surgeon used skin glue on the incision, you may shower in 24 hours.  The glue will flake off over the next 2-3 weeks.  Any sutures or staples will be removed at the office during your follow-up visit. 8. ACTIVITIES:  You may resume regular (light) daily activities beginning the next day--such as daily self-care, walking, climbing stairs--gradually increasing activities as tolerated.  You may have sexual intercourse when it is comfortable.  Refrain from any heavy lifting or straining until approved by your doctor.  a.You may drive when you are no longer taking prescription pain medication, you can comfortably wear a seatbelt, and you can safely maneuver your car and apply brakes. b.RETURN TO WORK:   _____________________________________________  9.You should see your doctor in the office for a follow-up appointment approximately 2-3 weeks after your surgery.  Make sure that you call for this appointment within a day or two after you arrive home to insure a convenient appointment time. 10.OTHER INSTRUCTIONS: _________________________    _____________________________________  WHEN TO CALL YOUR DOCTOR: 1. Fever over 101.0 2. Inability to urinate 3. Nausea and/or vomiting 4. Extreme swelling or bruising 5. Continued bleeding from incision. 6. Increased pain, redness, or drainage from the incision  The clinic staff is available to answer your questions during regular business hours.  Please dont hesitate to call and ask to speak to one of the nurses for clinical concerns.  If you have a medical emergency, go to the nearest emergency room or call 911.  A surgeon from [facility] is always  on call at the hospital   [address], [place], [place], [place]  [address] ?  [address], [place], [place]   [address] [phone] ? [phone] ? FAX [phone] Web site: [email or web address]   Post Anesthesia Home Care Instructions  Activity: Get plenty of rest for  the remainder of the day. A responsible individual must stay with you for 24 hours following the procedure.  For the next 24 hours, DO NOT: -Drive a car -Paediatric nurse -Drink alcoholic beverages -Take any medication unless instructed by your physician -Make any legal decisions or sign important papers.  Meals: Start with liquid foods such as gelatin or soup. Progress to regular foods as tolerated. Avoid greasy, spicy, heavy foods. If nausea and/or vomiting occur, drink only clear liquids until the nausea and/or vomiting subsides. Call your physician if vomiting continues.  Special Instructions/Symptoms: Your throat may feel dry or sore from the anesthesia or the breathing tube placed in your throat during surgery. If this causes discomfort, gargle with warm salt water. The discomfort should disappear within 24 hours.  If you had a scopolamine patch placed behind your ear for the management of post- operative nausea and/or vomiting:  1. The medication in the patch is effective for 72 hours, after which it should be removed.  Wrap patch in a tissue and discard in the trash. Wash hands thoroughly with soap and water. 2. You may remove the patch earlier than 72 hours if you experience unpleasant side effects which may include dry mouth, dizziness or visual disturbances. 3. Avoid touching the patch. Wash your hands with soap and water after contact with the patch.

## 2018-02-27 NOTE — Interval H&P Note (Signed)
History and Physical Interval Note:  02/27/2018 10:42 AM  Warren [name]  has presented today for surgery, with the diagnosis of Right inguinal hernia  The various methods of treatment have been discussed with the patient and family. After consideration of risks, benefits and other options for treatment, the patient has consented to  Procedure(s): OPEN RIGHT INGUINAL [place] (Right) INSERTION OF MESH (Right) as a surgical intervention .  The patient's history has been reviewed, patient examined, no change in status, stable for surgery.  I have reviewed the patient's chart and labs.  Questions were answered to the patient's satisfaction.     Warren [name]

## 2018-02-27 NOTE — Telephone Encounter (Signed)
Error opening  

## 2018-02-27 NOTE — Anesthesia Preprocedure Evaluation (Addendum)
Anesthesia Evaluation  Patient identified by MRN, date of birth, ID band Patient awake    Reviewed: Allergy & Precautions, NPO status , Patient's Chart, lab work & pertinent test results  History of Anesthesia Complications Negative for: history of anesthetic complications  Airway Mallampati: II  TM Distance: >3 FB Neck ROM: Full    Dental no notable dental hx. (+) Dental Advisory Given   Pulmonary sleep apnea , COPD, former smoker,    Pulmonary exam normal        Cardiovascular hypertension, + CAD  Normal cardiovascular exam  Study Conclusions  - Left ventricle: The cavity size was normal. Wall thickness was   normal. Systolic function was normal. The estimated ejection   fraction was in the range of 55% to 60%. Wall motion was normal;   there were no regional wall motion abnormalities. - Left atrium: The atrium was mildly dilated. - Pericardium, extracardiac: A small to moderate, free-flowing   pericardial effusion was identified circumferential to the heart.   There was mild undulation of the right atrial free wall but no   evidence of hemodynamic compromise. - Impressions: No vegetations seen on TTE.  Impressions:  - No vegetations seen on TTE.   Neuro/Psych Peripheral neuropathy dx'ed 20 years ago  Neuromuscular disease negative psych ROS   GI/Hepatic negative GI ROS, Neg liver ROS,   Endo/Other  negative endocrine ROS  Renal/GU negative Renal ROS  negative genitourinary   Musculoskeletal  (+) Arthritis ,   Abdominal   Peds negative pediatric ROS (+)  Hematology  (+) Blood dyscrasia, , AML S/P Stem cell tx   Anesthesia Other Findings   Reproductive/Obstetrics negative OB ROS                            Anesthesia Physical Anesthesia Plan  ASA: III  Anesthesia Plan: General   Post-op Pain Management: GA combined w/ Regional for post-op pain   Induction:  Intravenous  PONV Risk Score and Plan: 3 and Ondansetron, Dexamethasone and Diphenhydramine  Airway Management Planned: LMA  Additional Equipment:   Intra-op Plan:   Post-operative Plan: Extubation in OR  Informed Consent: I have reviewed the patients History and Physical, chart, labs and discussed the procedure including the risks, benefits and alternatives for the proposed anesthesia with the patient or authorized representative who has indicated his/her understanding and acceptance.   Dental advisory given  Plan Discussed with: CRNA and Anesthesiologist  Anesthesia Plan Comments:         Anesthesia Quick Evaluation

## 2018-02-27 NOTE — Anesthesia Procedure Notes (Signed)
Procedure Name: Intubation Date/Time: 02/27/2018 11:40 AM Performed by: Maryella Shivers, CRNA Pre-anesthesia Checklist: Patient identified, Emergency Drugs available, Suction available and Patient being monitored Patient Re-evaluated:Patient Re-evaluated prior to induction Oxygen Delivery Method: Circle system utilized Preoxygenation: Pre-oxygenation with 100% oxygen Induction Type: IV induction Ventilation: Mask ventilation without difficulty Tube type: Oral Tube size: 8.0 mm Number of attempts: 1 Airway Equipment and Method: Stylet and Oral airway Placement Confirmation: ETT inserted through vocal cords under direct vision,  positive ETCO2 and breath sounds checked- equal and bilateral Tube secured with: Tape Dental Injury: Teeth and Oropharynx as per pre-operative assessment

## 2018-02-27 NOTE — Transfer of Care (Signed)
Immediate Anesthesia Transfer of Care Note  Patient: Warren [name]  Procedure(s) Performed: OPEN RIGHT INGUINAL HERNIA REPAIR ERAS PATHWAY (Right Groin) INSERTION OF MESH (Right Groin)  Patient Location: PACU  Anesthesia Type:General  Level of Consciousness: awake  Airway & Oxygen Therapy: Patient Spontanous Breathing and Patient connected to face mask oxygen  Post-op Assessment: Report given to RN and Post -op Vital signs reviewed and stable  Post vital signs: Reviewed and stable  Last Vitals:  Vitals Value Taken Time  BP 147/79 02/27/2018 12:45 PM  Temp 36.5 C 02/27/2018 12:45 PM  Pulse 78 02/27/2018 12:47 PM  Resp 16 02/27/2018 12:47 PM  SpO2 95 % 02/27/2018 12:47 PM  Vitals shown include unvalidated device data.  Last Pain:  Vitals:   02/27/18 1023  TempSrc: Oral  PainSc: 0-No pain         Complications: No apparent anesthesia complications

## 2018-02-28 ENCOUNTER — Encounter (HOSPITAL_BASED_OUTPATIENT_CLINIC_OR_DEPARTMENT_OTHER): Payer: Self-pay | Admitting: General Surgery

## 2018-02-28 NOTE — Anesthesia Postprocedure Evaluation (Signed)
Anesthesia Post Note  Patient: Warren [name]  Procedure(s) Performed: OPEN RIGHT INGUINAL HERNIA REPAIR ERAS PATHWAY (Right Groin) INSERTION OF MESH (Right Groin)     Anesthesia Post Evaluation  Last Vitals:  Vitals:   02/27/18 1345 02/27/18 1427  BP: (!) 150/89 (!) 148/81  Pulse: 70 68  Resp: 11 16  Temp:  36.5 C  SpO2:  94%    Last Pain:  Vitals:   02/27/18 1427  TempSrc:   PainSc: 3                  [name] DANIEL

## 2018-02-28 NOTE — Addendum Note (Signed)
Addendum  created 02/28/18 1114 by Tawni Millers, CRNA   Charge Capture section accepted

## 2018-03-06 ENCOUNTER — Telehealth: Payer: Self-pay

## 2018-03-06 NOTE — Telephone Encounter (Signed)
Pt called requesting to speak with desk nurse regarding update. Returned phone call with no answer.

## 2018-03-07 ENCOUNTER — Telehealth: Payer: Self-pay

## 2018-03-07 NOTE — Telephone Encounter (Signed)
Multiple attempts to return call to pt 03/06/18 at 1452 and 03/07/18 at 1349. Pt given phone number to return call [phone].

## 2018-03-16 ENCOUNTER — Ambulatory Visit: Admit: 2018-03-16 | Discharge: 2018-03-17 | Payer: MEDICARE

## 2018-03-16 DIAGNOSIS — H269 Unspecified cataract: Principal | ICD-10-CM

## 2018-03-20 ENCOUNTER — Ambulatory Visit: Admit: 2018-03-20 | Discharge: 2018-03-20 | Payer: MEDICARE | Attending: Acute Care | Primary: Acute Care

## 2018-03-20 ENCOUNTER — Other Ambulatory Visit: Admit: 2018-03-20 | Discharge: 2018-03-20 | Payer: MEDICARE

## 2018-03-20 DIAGNOSIS — C9201 Acute myeloblastic leukemia, in remission: Secondary | ICD-10-CM

## 2018-03-20 DIAGNOSIS — D89813 Graft-versus-host disease, unspecified: Secondary | ICD-10-CM

## 2018-03-20 DIAGNOSIS — Z9484 Stem cells transplant status: Principal | ICD-10-CM

## 2018-03-27 ENCOUNTER — Ambulatory Visit: Admit: 2018-03-27 | Discharge: 2018-03-27 | Payer: MEDICARE

## 2018-03-27 ENCOUNTER — Ambulatory Visit
Admit: 2018-03-27 | Discharge: 2018-03-27 | Payer: MEDICARE | Attending: Hematology & Oncology | Primary: Hematology & Oncology

## 2018-03-27 DIAGNOSIS — D89813 Graft-versus-host disease, unspecified: Principal | ICD-10-CM

## 2018-03-27 DIAGNOSIS — Z8719 Personal history of other diseases of the digestive system: Secondary | ICD-10-CM

## 2018-03-27 DIAGNOSIS — Z23 Encounter for immunization: Secondary | ICD-10-CM

## 2018-03-27 DIAGNOSIS — C9201 Acute myeloblastic leukemia, in remission: Secondary | ICD-10-CM

## 2018-03-27 DIAGNOSIS — Z9484 Stem cells transplant status: Secondary | ICD-10-CM

## 2018-03-27 MED ORDER — POSACONAZOLE 100 MG TABLET,DELAYED RELEASE
ORAL_TABLET | ORAL | 1 refills | 0 days | Status: CP
Start: 2018-03-27 — End: 2018-05-14

## 2018-03-27 MED ORDER — TACROLIMUS 0.5 MG CAPSULE
ORAL_CAPSULE | Freq: Every day | ORAL | 4 refills | 0 days
Start: 2018-03-27 — End: 2018-06-01

## 2018-03-27 MED ORDER — PREDNISONE 5 MG TABLET
ORAL_TABLET | Freq: Every day | ORAL | 1 refills | 0 days | Status: CP
Start: 2018-03-27 — End: 2018-06-01

## 2018-04-02 ENCOUNTER — Telehealth: Payer: Self-pay | Admitting: Pulmonary Disease

## 2018-04-02 DIAGNOSIS — R400 Somnolence: Secondary | ICD-10-CM

## 2018-04-02 NOTE — Telephone Encounter (Signed)
Called and spoke with patient he would like to have a home sleep study done. Advised patient that BQ is out of the office until mid week. He states that he would wait until BQ comes back to discuss this.   BQ please advise, thanks.

## 2018-04-04 NOTE — Telephone Encounter (Signed)
Fine by me: reason: daytime somonolence

## 2018-04-04 NOTE — Telephone Encounter (Signed)
Pt is returning call. Cb is [phone]

## 2018-04-04 NOTE — Telephone Encounter (Signed)
Attempted to call the pt. I did not receive an answer. I have left a message for pt to return our call.  

## 2018-04-04 NOTE — Telephone Encounter (Signed)
Spoke with the pt and notified of recs per BQ  Order sent to [facility]

## 2018-04-13 ENCOUNTER — Telehealth: Payer: Self-pay

## 2018-04-13 ENCOUNTER — Other Ambulatory Visit: Payer: Self-pay

## 2018-04-13 ENCOUNTER — Inpatient Hospital Stay: Payer: Medicare Other | Attending: Hematology

## 2018-04-13 DIAGNOSIS — D649 Anemia, unspecified: Secondary | ICD-10-CM

## 2018-04-13 LAB — CBC WITH DIFFERENTIAL (CANCER CENTER ONLY)
BASOS ABS: 0 10*3/uL (ref 0.0–0.1)
BASOS PCT: 0 %
EOS ABS: 0.1 10*3/uL (ref 0.0–0.5)
Eosinophils Relative: 1 %
HEMATOCRIT: 37.2 % — AB (ref 38.4–49.9)
HEMOGLOBIN: 11.9 g/dL — AB (ref 13.0–17.1)
Lymphocytes Relative: 5 %
Lymphs Abs: 0.5 10*3/uL — ABNORMAL LOW (ref 0.9–3.3)
MCH: 35.3 pg — ABNORMAL HIGH (ref 27.2–33.4)
MCHC: 32 g/dL (ref 32.0–36.0)
MCV: 110.4 fL — ABNORMAL HIGH (ref 79.3–98.0)
Monocytes Absolute: 0.7 10*3/uL (ref 0.1–0.9)
Monocytes Relative: 8 %
NEUTROS ABS: 7.8 10*3/uL — AB (ref 1.5–6.5)
Neutrophils Relative %: 86 %
Platelet Count: 163 10*3/uL (ref 140–400)
RBC: 3.37 MIL/uL — AB (ref 4.20–5.82)
RDW: 14 % (ref 11.0–14.6)
WBC: 9.1 10*3/uL (ref 4.0–10.3)

## 2018-04-13 NOTE — Telephone Encounter (Signed)
Pt called concerned about nosebleeds. Discussed with Dr. Ok Edwards at [facility]. Recommendation for pt to have Hgb and Plt checked this afternoon. Dr. Irene Limbo okay with plan. Pt scheduled to have CBC drawn at 1430. Results received and Hgb, Plt, and ANC WNL. Results shared with Dr. Irene Limbo and pt given copy. Okay to go on vacation this weekend. Pt determined some additional causes of nosebleeds, including allergies and elevated BP. Pt intends to get saline spray from drug store to moisten inside of nose. Pt knows to notify office with any further concerns.

## 2018-04-20 ENCOUNTER — Ambulatory Visit
Admission: RE | Admit: 2018-04-20 | Discharge: 2018-04-20 | Disposition: A | Discharge status: Home / Self Care | Payer: Medicare Other | Source: Ambulatory Visit | Attending: Nurse Practitioner | Admitting: Nurse Practitioner

## 2018-04-20 ENCOUNTER — Other Ambulatory Visit: Payer: Self-pay | Admitting: Nurse Practitioner

## 2018-04-20 DIAGNOSIS — M545 Low back pain, unspecified: Secondary | ICD-10-CM

## 2018-04-20 DIAGNOSIS — N200 Calculus of kidney: Secondary | ICD-10-CM

## 2018-04-20 DIAGNOSIS — R109 Unspecified abdominal pain: Secondary | ICD-10-CM

## 2018-04-21 ENCOUNTER — Emergency Department (HOSPITAL_COMMUNITY): Payer: Medicare Other

## 2018-04-21 ENCOUNTER — Other Ambulatory Visit: Payer: Self-pay

## 2018-04-21 ENCOUNTER — Encounter (HOSPITAL_COMMUNITY): Payer: Self-pay

## 2018-04-21 ENCOUNTER — Emergency Department (HOSPITAL_COMMUNITY)
Admission: EM | Admit: 2018-04-21 | Discharge: 2018-04-21 | Disposition: A | Discharge status: Home / Self Care | Payer: Medicare Other | Attending: Physician Assistant | Admitting: Physician Assistant

## 2018-04-21 DIAGNOSIS — D469 Myelodysplastic syndrome, unspecified: Secondary | ICD-10-CM | POA: Insufficient documentation

## 2018-04-21 DIAGNOSIS — I110 Hypertensive heart disease with heart failure: Secondary | ICD-10-CM | POA: Insufficient documentation

## 2018-04-21 DIAGNOSIS — J449 Chronic obstructive pulmonary disease, unspecified: Secondary | ICD-10-CM | POA: Diagnosis not present

## 2018-04-21 DIAGNOSIS — Z8546 Personal history of malignant neoplasm of prostate: Secondary | ICD-10-CM | POA: Insufficient documentation

## 2018-04-21 DIAGNOSIS — Z9481 Bone marrow transplant status: Secondary | ICD-10-CM | POA: Insufficient documentation

## 2018-04-21 DIAGNOSIS — M545 Low back pain, unspecified: Secondary | ICD-10-CM

## 2018-04-21 DIAGNOSIS — Z79899 Other long term (current) drug therapy: Secondary | ICD-10-CM | POA: Diagnosis not present

## 2018-04-21 DIAGNOSIS — I5032 Chronic diastolic (congestive) heart failure: Secondary | ICD-10-CM | POA: Insufficient documentation

## 2018-04-21 DIAGNOSIS — R109 Unspecified abdominal pain: Secondary | ICD-10-CM | POA: Diagnosis not present

## 2018-04-21 DIAGNOSIS — I2510 Atherosclerotic heart disease of native coronary artery without angina pectoris: Secondary | ICD-10-CM | POA: Insufficient documentation

## 2018-04-21 DIAGNOSIS — Z87891 Personal history of nicotine dependence: Secondary | ICD-10-CM | POA: Diagnosis not present

## 2018-04-21 HISTORY — DX: Myelodysplastic syndrome, unspecified: D46.9

## 2018-04-21 LAB — CBC WITH DIFFERENTIAL/PLATELET
Basophils Absolute: 0 10*3/uL (ref 0.0–0.1)
Basophils Relative: 0 %
EOS ABS: 0 10*3/uL (ref 0.0–0.7)
Eosinophils Relative: 0 %
HCT: 39.1 % (ref 39.0–52.0)
HEMOGLOBIN: 12.6 g/dL — AB (ref 13.0–17.0)
LYMPHS PCT: 6 %
Lymphs Abs: 0.6 10*3/uL — ABNORMAL LOW (ref 0.7–4.0)
MCH: 36.1 pg — ABNORMAL HIGH (ref 26.0–34.0)
MCHC: 32.2 g/dL (ref 30.0–36.0)
MCV: 112 fL — ABNORMAL HIGH (ref 78.0–100.0)
MONO ABS: 0.7 10*3/uL (ref 0.1–1.0)
Monocytes Relative: 7 %
NEUTROS PCT: 87 %
Neutro Abs: 8.7 10*3/uL — ABNORMAL HIGH (ref 1.7–7.7)
PLATELETS: 196 10*3/uL (ref 150–400)
RBC: 3.49 MIL/uL — AB (ref 4.22–5.81)
RDW: 15.1 % (ref 11.5–15.5)
WBC: 10 10*3/uL (ref 4.0–10.5)

## 2018-04-21 LAB — URINALYSIS, ROUTINE W REFLEX MICROSCOPIC
BILIRUBIN URINE: NEGATIVE
GLUCOSE, UA: NEGATIVE mg/dL
Hgb urine dipstick: NEGATIVE
KETONES UR: NEGATIVE mg/dL
Leukocytes, UA: NEGATIVE
Nitrite: NEGATIVE
Protein, ur: NEGATIVE mg/dL
SPECIFIC GRAVITY, URINE: 1.01 (ref 1.005–1.030)
pH: 7 (ref 5.0–8.0)

## 2018-04-21 LAB — COMPREHENSIVE METABOLIC PANEL
ALK PHOS: 75 U/L (ref 38–126)
ALT: 32 U/L (ref 17–63)
ANION GAP: 8 (ref 5–15)
AST: 36 U/L (ref 15–41)
Albumin: 4.1 g/dL (ref 3.5–5.0)
BUN: 35 mg/dL — ABNORMAL HIGH (ref 6–20)
CALCIUM: 8.9 mg/dL (ref 8.9–10.3)
CO2: 26 mmol/L (ref 22–32)
CREATININE: 1.85 mg/dL — AB (ref 0.61–1.24)
Chloride: 106 mmol/L (ref 101–111)
GFR, EST AFRICAN AMERICAN: 39 mL/min — AB (ref >60)
GFR, EST NON AFRICAN AMERICAN: 33 mL/min — AB (ref >60)
Glucose, Bld: 125 mg/dL — ABNORMAL HIGH (ref 65–99)
Potassium: 5.2 mmol/L — ABNORMAL HIGH (ref 3.5–5.1)
SODIUM: 140 mmol/L (ref 135–145)
Total Bilirubin: 0.9 mg/dL (ref 0.3–1.2)
Total Protein: 7 g/dL (ref 6.5–8.1)

## 2018-04-21 MED ORDER — CYCLOBENZAPRINE HCL 10 MG PO TABS: 10.0000 mg | ORAL_TABLET | Freq: Two times a day (BID) | ORAL | 0 refills | Status: DC | PRN

## 2018-04-21 NOTE — Discharge Instructions (Signed)
We are unsure what is causing the pain in the left hand side of your back.  It could be muscular.  We want you to use salon pause, acetaminophen, and Flexeril.  Please note whether you have a rash as this could be shingles.  We noticed a rising creatinine, your kidney function.  We have printed out the numbers for you to discuss with your transplant physician who you will see within a couple days.  Please return with any concerning symptoms or other concerns.

## 2018-04-21 NOTE — ED Triage Notes (Signed)
He c/o left-sided low back pain radiating toward flank. He tells [place] that he has hx of myelodysplasia which eventually resulted in A.M.L. He also tells [place] he had stem-cell therapy last April at [facility]. He further tells [place] that he remains on Prednisone d/t graft-vs-host issues. He saw his pcp yesterday who performed urine and blood work and lumbar spine x-ray. He is in no distress. He also states his creatinine has been found to be gradually increasing recently @ 1.8.

## 2018-04-21 NOTE — ED Notes (Signed)
ED Provider at bedside. 

## 2018-04-21 NOTE — ED Notes (Signed)
Patient transported to CT 

## 2018-04-21 NOTE — ED Provider Notes (Signed)
[facility] DEPT Provider Note   CSN: [id number] Arrival date & time: 04/21/18  1623     History   Chief Complaint Chief Complaint  Patient presents with  . Back Pain    HPI Warren [name] is a 77 y.o. male.  HPI   77 year old male with AML  status post bone marrow transplant at [facility].  He is presenting today with increasing left flank pain.  Patient reports that he called his transplant team who sent him to get blood work yesterday.  They reported a mildly increased creatinine of 1.8 up from 1.5.  Negative urine at that time.  Patient reports he feels like the pain has gotten worse.  He is taking several Tylenol and had the pain continued.  He says is difficult to tell what makes it come and go.  He reports it is constant in nature but can be up to 10 out of 10 occasionally.  Does not seem to be associated with movement, eating.  No history of kidney stones.  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  Patient eating and drinking normally.  Reports mild constipation. Past Medical History:  Diagnosis Date  . AML (acute myeloblastic leukemia) ([place]) 2018  . Cancer [facility])    Prostate, Melanoma- lt. Shoulder (no further problems since 2010  . Chronic diastolic CHF (congestive heart failure) ([place]) 09/13/2016  . Complication of anesthesia    versed gave the adverse reaction pt. ,became agitated  . COPD (chronic obstructive pulmonary disease) (HCC)    Dr. Annamaria Boots follows  . Diffuse esophageal spasm   . Diverticulosis   . DJD (degenerative joint disease)   . Multiple trauma     horse accident 2003 L SHOULDER  . Myelodysplasia (myelodysplastic syndrome) ([place])   . Oral herpes   . OSA (obstructive sleep apnea)    mild no CPAP use  . Peripheral neuropathy   . Pulmonary nodule   . Right inguinal hernia 02/27/2018  . Spinal stenosis   . Systolic hypertension     Patient Active Problem List   Diagnosis  Date Noted  . Myelodysplasia (myelodysplastic syndrome) ([place])   . Right inguinal hernia 02/27/2018  . Graft vs host disease ([place]) 01/15/2018  . Peripheral edema   . Lung nodule   . Chest pain   . Pleural effusion, right   . Cough   . Staphylococcus aureus bacteremia with sepsis ([place])   . Chronic pain of left ankle   . Polyuria   . Bacteremia 09/16/2016  . Neutropenic fever ([place]) 09/15/2016  . Chronic diastolic CHF (congestive heart failure) ([place]) 09/13/2016  . Acute respiratory failure with hypoxia ([place]) 09/13/2016  . Myelodysplastic syndrome ([place]) 08/25/2016  . Ankle cellulitis 08/25/2016  . Splinter 08/25/2016  . Mucositis due to antineoplastic therapy 08/03/2016  . Skin rash 08/03/2016  . Iron deficiency anemia 04/18/2016  . RAEB-2 (refractory anemia with excess blasts-2) ([place]) 03/24/2016  . Leucopenia 03/24/2016  . Neutropenia ([place]) 03/18/2016  . Symptomatic anemia 03/18/2016  . Leukopenia 03/18/2016  . Pancytopenia ([place]) 03/18/2016  . S/P thoracentesis 03/18/2016  . Thrombocytopenia ([place]) 03/18/2016  . Neutropenia, unspecified ([place]) 03/18/2016  . Dyspnea on exertion 03/09/2016  . Dyslipidemia 11/11/2014  . CAD (coronary artery disease), native coronary artery 11/11/2014  . Benign essential HTN   . Acute bronchitis 10/06/2010  . OSA (obstructive sleep apnea) 10/07/2008  . PULMONARY NODULE 10/07/2008    Past Surgical History:  Procedure Laterality Date  . ,laceration  to head in accident with hourse    . ACOUSTIC NEUROMA LEFT EAR  1999  . BALLOON DILATION N/A 11/25/2014   Procedure: BALLOON DILATION;  Surgeon: Garlan Fair, MD;  Location: [facility];  Service: Endoscopy;  Laterality: N/A;  . COLONOSCOPY WITH PROPOFOL N/A 10/01/2013   Procedure: COLONOSCOPY WITH PROPOFOL;  Surgeon: Garlan Fair, MD;  Location: [facility] ENDOSCOPY;  Service: Endoscopy;  Laterality: N/A;  . ESOPHAGOGASTRODUODENOSCOPY (EGD) WITH PROPOFOL N/A 11/25/2014   Procedure:  ESOPHAGOGASTRODUODENOSCOPY (EGD) WITH PROPOFOL;  Surgeon: Garlan Fair, MD;  Location: [facility] ENDOSCOPY;  Service: Endoscopy;  Laterality: N/A;  . EYE SURGERY     cosmetic surgery"brow lift" 4'15  . INGUINAL HERNIA REPAIR Right 02/27/2018   Procedure: OPEN RIGHT INGUINAL HERNIA REPAIR ERAS PATHWAY;  Surgeon: Fanny Skates, MD;  Location: [facility];  Service: General;  Laterality: Right;  . INSERTION OF MESH Right 02/27/2018   Procedure: INSERTION OF MESH;  Surgeon: Fanny Skates, MD;  Location: [facility];  Service: General;  Laterality: Right;  . LIMBAL STEM CELL TRANSPLANT  03/17/2017  . MULTIPLE PROCEDURES FOR LEFT ARM     ELBOW,RIB FX PNEUMOTHORAX AFTER FALLING OFF MOUNTAIN  . PILONIDAL CYST / SINUS EXCISION    . RADICAL PROSTATECTOMY    . TEE WITHOUT CARDIOVERSION N/A 09/20/2016   Procedure: TRANSESOPHAGEAL ECHOCARDIOGRAM (TEE);  Surgeon: Jerline Pain, MD;  Location: [facility] ENDOSCOPY;  Service: Cardiovascular;  Laterality: N/A;        Home Medications    Prior to Admission medications   Medication Sig Start Date End Date Taking? Authorizing Provider  albuterol (PROAIR HFA) 108 (90 Base) MCG/ACT inhaler Inhale 2 puffs into the lungs every 6 (six) hours as needed for wheezing or shortness of breath. 01/15/18   Magdalen Spatz, NP  calcium carbonate (TUMS - DOSED IN MG ELEMENTAL CALCIUM) 500 MG chewable tablet Chew 0.5 tablets by mouth 2 (two) times daily.     [provider]  Carboxymethylcellulose Sodium (THERATEARS) 0.25 % SOLN Apply 1 drop to eye 4 (four) times daily.    [provider]  Cholecalciferol (VITAMIN D-1000 MAX ST) 1000 units tablet Take 1,000 Units by mouth 2 (two) times daily.  04/21/17   [provider]  cyclobenzaprine (FLEXERIL) 10 MG tablet Take 1 tablet (10 mg total) by mouth 2 (two) times daily as needed for muscle spasms. 04/21/18   Caron Ode Lyn, MD  dapsone 100 MG tablet Take 100 mg by mouth daily.     [provider]  diltiazem (CARDIZEM) 60 MG tablet Take 60 mg by mouth daily.  07/09/17 07/09/18  [provider]  famotidine (PEPCID) 20 MG tablet Take 20 mg by mouth 2 (two) times daily.  05/08/17 05/08/18  [provider]  HYDROcodone-acetaminophen (NORCO) 5-325 MG tablet Take 1-2 tablets by mouth every 6 (six) hours as needed for moderate pain or severe pain. 02/27/18   Fanny Skates, MD  hydrocortisone 1 % lotion Apply topically. 06/22/17 06/22/18  [provider]  loratadine (CLARITIN) 10 MG tablet Take 10 mg by mouth daily.    [provider]  predniSONE (DELTASONE) 5 MG tablet 7.5 mg 07/21/17   [provider]  tacrolimus (PROGRAF) 0.5 MG capsule One (0.5 mg) AM and Two (1 mg) PM 07/21/17   [provider]  umeclidinium-vilanterol (ANORO ELLIPTA) 62.5-25 MCG/INH AEPB Inhale 1 puff into the lungs daily. 01/30/18   Juanito Doom, MD  valACYclovir (VALTREX) 500 MG tablet TAKE  ONE TABLET BY MOUTH TWICE DAILY AT  NIGHT  TO  SUPPRESS  HERPES  GINGIVITIS 11/03/16   Brunetta Genera, MD    Family History Family History  Problem Relation Age of Onset  . Emphysema Father   . Asthma Father   . Lung cancer Father   . Breast cancer Sister   . Breast cancer Sister   . Skin cancer Brother   . Prostate cancer Brother     Social History Social History   Tobacco Use  . Smoking status: Former Smoker    Packs/day: 1.00    Years: 40.00    Pack years: 40.00    Types: Cigarettes    Last attempt to quit: 11/29/1995    Years since quitting: 22.4  . Smokeless tobacco: Never Used  Substance Use Topics  . Alcohol use: Yes    Comment: social, 1 drink before dinner  . Drug use: No     Allergies   Fluconazole and Midazolam   Review of Systems Review of Systems  Constitutional: Negative for activity change, fatigue and fever.  Respiratory: Negative for shortness of breath.   Cardiovascular: Negative for chest pain.    Gastrointestinal: Positive for nausea. Negative for abdominal pain and vomiting.  Genitourinary: Positive for flank pain. Negative for difficulty urinating, dysuria, hematuria, penile pain, testicular pain and urgency.  All other systems reviewed and are negative.    Physical Exam Updated Vital Signs BP (!) 157/82 (BP Location: Left Arm)   Pulse 71   Temp 98.2 F (36.8 C) (Oral)   Resp 16   Ht 5\' 7"  (1.702 m)   Wt 65.8 kg (145 lb)   SpO2 93%   BMI 22.71 kg/m   Physical Exam  Constitutional: He is oriented to person, place, and time. He appears well-nourished.  HENT:  Head: Normocephalic.  Eyes: Conjunctivae are normal. Right eye exhibits no discharge. Left eye exhibits no discharge.  Cardiovascular: Normal rate and regular rhythm.  No murmur heard. Pulmonary/Chest: Effort normal and breath sounds normal. No respiratory distress.  Abdominal: Soft. He exhibits no distension. There is no tenderness.  Musculoskeletal:  Mild tenderness over the iliac crest on the left-hand side.  Mild CVA tenderness on palpation.  Left  Neurological: He is oriented to person, place, and time.  Skin: Skin is warm and dry. He is not diaphoretic.  Psychiatric: He has a normal mood and affect. His behavior is normal.  Nursing note and vitals reviewed.    ED Treatments / Results  Labs (all labs ordered are listed, but only abnormal results are displayed) Labs Reviewed  COMPREHENSIVE METABOLIC PANEL - Abnormal; Notable for the following components:      Result Value   Potassium 5.2 (*)    Glucose, Bld 125 (*)    BUN 35 (*)    Creatinine, Ser 1.85 (*)    GFR calc non Af Amer 33 (*)    GFR calc Af Amer 39 (*)    All other components within normal limits  CBC WITH DIFFERENTIAL/PLATELET - Abnormal; Notable for the following components:   RBC 3.49 (*)    Hemoglobin 12.6 (*)    MCV 112.0 (*)    MCH 36.1 (*)    Neutro Abs 8.7 (*)    Lymphs Abs 0.6 (*)    All other components within normal  limits  URINE CULTURE  URINALYSIS, ROUTINE W REFLEX MICROSCOPIC  TACROLIMUS LEVEL    EKG None  Radiology Dg Lumbar Spine 2-3 Views  Result  Date: 04/20/2018 CLINICAL DATA:  Low back pain for 2 weeks without known injury. EXAM: LUMBAR SPINE - 2-3 VIEW COMPARISON:  CT scan of September 22, 2016. FINDINGS: No fracture or spondylolisthesis is noted. Severe degenerative disc disease is noted at L5-S1. Moderate degenerative disc disease is noted at L3-4. Atherosclerosis of abdominal aorta is noted. IMPRESSION: Multilevel degenerative disc disease. No acute abnormality seen in the lumbar spine. Aortic Atherosclerosis (ICD10-I70.0). Electronically Signed   By: Marijo Conception, M.D.   On: 04/20/2018 15:16   Ct Renal Stone Study  Result Date: 04/21/2018 CLINICAL DATA:  status post bone marrow transplant at [facility]. He is presenting today with increasing left flank pain. Patient reports that he called his transplant team who sent him to get blood work yesterday. They reported a mildly increased creatinine of 1.8 up from 1.5. Negative urine at that time. Patient reports he feels like the pain has gotten worse. He is taking several Tylenol and had the pain continued. He says is difficult to tell what makes it come and go. He reports it is constant in nature but can be up to 10 out of 10 occasionally. Does not seem to be associated with movement, eating. No history of kidney stones. EXAM: CT ABDOMEN AND PELVIS WITHOUT CONTRAST TECHNIQUE: Multidetector CT imaging of the abdomen and pelvis was performed following the standard protocol without IV contrast. COMPARISON:  09/22/2016 FINDINGS: Lower chest: No acute findings. Calcified granuloma in the left lower lobe. Hepatobiliary: Liver normal in size. 19 mm cyst, base of colonic lobe. 7 mm low-density mass, also likely a cyst, in segment 7. No other liver masses or lesions. Normal gallbladder. No bile duct dilation. Pancreas: Unremarkable. No pancreatic ductal dilatation or  surrounding inflammatory changes. Spleen: Normal in size without focal abnormality. Adrenals/Urinary Tract: No adrenal masses. 11 mm low-density mass, posterior upper pole the right kidney. 2.4 cm low-density lower pole renal sinus mass. These are both consistent with cysts. Small low-density mass along the anterior midpole the right kidney, also consistent with a cyst. These were present on the prior CT. No other renal masses, no stones and no hydronephrosis. Ureters are normal course and in caliber.  No ureteral stones. Bladder is unremarkable. Stomach/Bowel: Stomach and small bowel unremarkable. Moderate increased stool burden in the colon. No colonic wall thickening or inflammation. Appendix not visualized. No evidence of appendicitis. Vascular/Lymphatic: Aortic atherosclerosis. No enlarged abdominal or pelvic lymph nodes. Reproductive: Status post prostatectomy.  No pelvic masses. Other: No abdominal wall hernia or abnormality. No abdominopelvic ascites. Musculoskeletal: No fracture or acute finding. Chronic right pars defect at L5-S1 where there is a grade 1 anterolisthesis. No osteoblastic or osteolytic lesions. IMPRESSION: 1. No acute findings within the abdomen or pelvis. Specifically, no renal or ureteral stones or obstructive uropathy. 2. Moderate increased stool throughout the colon. 3. Changes from a prior prostatectomy. 4. Aortic atherosclerosis. Electronically Signed   By: Lajean Manes M.D.   On: 04/21/2018 18:39    Procedures Procedures (including critical care time)  Medications Ordered in ED Medications - No data to display   Initial Impression / Assessment and Plan / ED Course  I have reviewed the triage vital signs and the nursing notes.  Pertinent labs & imaging results that were available during my care of the patient were reviewed by me and considered in my medical decision making (see chart for details).     77 year old male with AML  status post bone marrow transplant at  [facility].  He is  presenting today with increasing left flank pain.  Patient reports that he called his transplant team who sent him to get blood work yesterday.  They reported a mildly increased creatinine of 1.8 up from 1.5.  Negative urine at that time.  Patient reports he feels like the pain has gotten worse.  He is taking several Tylenol and had the pain continued.  He says is difficult to tell what makes it come and go.  He reports it is constant in nature but can be up to 10 out of 10 occasionally.  Does not seem to be associated with movement, eating.  No history of kidney stones.  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  Patient eating and drinking normally.  Reports mild constipation.  5:49 PM Symptoms do not sound very typical.  Potentially could be a kidney stone.  Patient will repeat patient's labs to make sure patient's creatinine is not worsening, will get urine, sent for culture.  Will get CT stone to rule out neoplasm, stone, pyelo. [name]  8:47 PM CT was reassuring.  Labs showed a creatinine of 1.8, exactly what patient was anticipating.  His Potassium which is a little bit elevated at 5.2, (normal lab value was up to 5.1)  I do not believe that this is at a dangerous level.  Will have patient follow-up within 48 hours with his primary team.  He has an appointment Monday or Tuesday with his transplant team.  This could be muscular, could be early shingles, or other pathology.  We will give him supportive care, follow-up closely.  Final Clinical Impressions(s) / ED Diagnoses   Final diagnoses:  Acute left-sided low back pain without sciatica    ED Discharge Orders        Ordered    cyclobenzaprine (FLEXERIL) 10 MG tablet  2 times daily PRN     04/21/18 2032       Macarthur Critchley, MD 04/21/18 2048

## 2018-04-23 LAB — URINE CULTURE: Culture: NO GROWTH

## 2018-04-24 ENCOUNTER — Other Ambulatory Visit: Payer: Medicare Other

## 2018-04-24 ENCOUNTER — Other Ambulatory Visit: Admit: 2018-04-24 | Discharge: 2018-04-24 | Payer: MEDICARE

## 2018-04-24 ENCOUNTER — Ambulatory Visit
Admit: 2018-04-24 | Discharge: 2018-04-24 | Payer: MEDICARE | Attending: Hematology & Oncology | Primary: Hematology & Oncology

## 2018-04-24 DIAGNOSIS — Z9484 Stem cells transplant status: Secondary | ICD-10-CM

## 2018-04-24 DIAGNOSIS — H2513 Age-related nuclear cataract, bilateral: Secondary | ICD-10-CM

## 2018-04-24 DIAGNOSIS — C9201 Acute myeloblastic leukemia, in remission: Secondary | ICD-10-CM

## 2018-04-24 DIAGNOSIS — D46Z Other myelodysplastic syndromes: Secondary | ICD-10-CM

## 2018-04-24 DIAGNOSIS — I10 Essential (primary) hypertension: Secondary | ICD-10-CM

## 2018-04-24 DIAGNOSIS — D89810 Acute graft-versus-host disease: Principal | ICD-10-CM

## 2018-04-24 DIAGNOSIS — C9200 Acute myeloblastic leukemia, not having achieved remission: Principal | ICD-10-CM

## 2018-04-24 DIAGNOSIS — K4090 Unilateral inguinal hernia, without obstruction or gangrene, not specified as recurrent: Secondary | ICD-10-CM

## 2018-04-24 DIAGNOSIS — D899 Disorder involving the immune mechanism, unspecified: Secondary | ICD-10-CM

## 2018-04-24 DIAGNOSIS — A0471 Enterocolitis due to Clostridium difficile, recurrent: Secondary | ICD-10-CM

## 2018-04-24 LAB — TACROLIMUS LEVEL: TACROLIMUS (FK506) - LABCORP: 7.3 ng/mL (ref 2.0–20.0)

## 2018-05-02 DIAGNOSIS — G4733 Obstructive sleep apnea (adult) (pediatric): Secondary | ICD-10-CM

## 2018-05-03 ENCOUNTER — Other Ambulatory Visit: Payer: Self-pay | Admitting: *Deleted

## 2018-05-03 DIAGNOSIS — R400 Somnolence: Secondary | ICD-10-CM

## 2018-05-04 MED ORDER — CEFDINIR 300 MG CAPSULE
ORAL_CAPSULE | Freq: Two times a day (BID) | ORAL | 0 refills | 0 days | Status: CP
Start: 2018-05-04 — End: 2018-06-01

## 2018-05-08 ENCOUNTER — Ambulatory Visit
Admission: RE | Admit: 2018-05-08 | Discharge: 2018-05-08 | Disposition: A | Discharge status: Home / Self Care | Payer: Medicare Other | Source: Ambulatory Visit | Attending: Internal Medicine | Admitting: Internal Medicine

## 2018-05-08 ENCOUNTER — Ambulatory Visit: Payer: Medicare Other | Admitting: Pulmonary Disease

## 2018-05-08 ENCOUNTER — Other Ambulatory Visit: Payer: Self-pay | Admitting: Internal Medicine

## 2018-05-08 DIAGNOSIS — R059 Cough, unspecified: Secondary | ICD-10-CM

## 2018-05-08 DIAGNOSIS — R05 Cough: Secondary | ICD-10-CM

## 2018-05-15 DIAGNOSIS — G4733 Obstructive sleep apnea (adult) (pediatric): Secondary | ICD-10-CM | POA: Diagnosis not present

## 2018-05-15 MED ORDER — NOXAFIL 100 MG TABLET,DELAYED RELEASE
ORAL_TABLET | 1 refills | 0 days | Status: CP
Start: 2018-05-15 — End: 2018-06-01

## 2018-05-16 ENCOUNTER — Telehealth: Payer: Self-pay | Admitting: Pulmonary Disease

## 2018-05-16 NOTE — Telephone Encounter (Signed)
CY please advise on HST results.   Thanks  

## 2018-05-16 NOTE — Telephone Encounter (Signed)
PCC's do we have these results? The order is marked Active. I am not sure about this process. Please advise.

## 2018-05-16 NOTE — Telephone Encounter (Signed)
I believe this is still on [facility]'s cart to be read

## 2018-05-17 NOTE — Telephone Encounter (Signed)
Home sleep test showed moderately severe obstructive sleep apnea, averaging 19 apneas/ hour. Blood oxygen level dropped with apneas, and was low throughout the night (86%).  Recommend we start CPAP. We will recheck oxygen during sleep once adjusted to CPAP. If the oxygen saturation stays low, we may end up needing to talk about a CPAP titration study at the sleep center to document need to add oxygen. We can talk about this on follow-up.   Order- new DME, new CPAP auto 5-20, mask of choice, humidifier, supplies, AirView  Please make sure he has a return ov in 31-90 days per insurance regs.

## 2018-05-17 NOTE — Telephone Encounter (Signed)
Spoke with pt. He is aware of his results. Pt is leaving for [place] for 4 weeks. He wants to wait until he returns to start CPAP therapy. Advised him to contact us once he returns to town. Nothing further was needed at this time.

## 2018-05-30 MED ORDER — DAPSONE 100 MG TABLET
ORAL_TABLET | 4 refills | 0 days | Status: CP
Start: 2018-05-30 — End: ?

## 2018-06-01 ENCOUNTER — Other Ambulatory Visit: Admit: 2018-06-01 | Discharge: 2018-06-02 | Payer: MEDICARE

## 2018-06-01 ENCOUNTER — Ambulatory Visit: Admit: 2018-06-01 | Discharge: 2018-06-02 | Payer: MEDICARE

## 2018-06-01 DIAGNOSIS — D899 Disorder involving the immune mechanism, unspecified: Secondary | ICD-10-CM

## 2018-06-01 DIAGNOSIS — M8580 Other specified disorders of bone density and structure, unspecified site: Secondary | ICD-10-CM

## 2018-06-01 DIAGNOSIS — C9201 Acute myeloblastic leukemia, in remission: Secondary | ICD-10-CM

## 2018-06-01 DIAGNOSIS — D89810 Acute graft-versus-host disease: Secondary | ICD-10-CM

## 2018-06-01 DIAGNOSIS — Z9484 Stem cells transplant status: Principal | ICD-10-CM

## 2018-06-01 DIAGNOSIS — D89813 Graft-versus-host disease, unspecified: Secondary | ICD-10-CM

## 2018-06-01 DIAGNOSIS — D89811 Chronic graft-versus-host disease: Secondary | ICD-10-CM

## 2018-06-01 DIAGNOSIS — D46Z Other myelodysplastic syndromes: Principal | ICD-10-CM

## 2018-06-01 DIAGNOSIS — D649 Anemia, unspecified: Secondary | ICD-10-CM

## 2018-06-01 DIAGNOSIS — C9200 Acute myeloblastic leukemia, not having achieved remission: Secondary | ICD-10-CM

## 2018-06-01 MED ORDER — PREDNISONE 5 MG TABLET
ORAL_TABLET | Freq: Every day | ORAL | 1 refills | 0 days
Start: 2018-06-01 — End: 2018-06-26

## 2018-06-05 ENCOUNTER — Ambulatory Visit: Payer: Medicare Other | Admitting: Pulmonary Disease

## 2018-06-20 ENCOUNTER — Encounter: Payer: Self-pay | Admitting: Pulmonary Disease

## 2018-06-20 ENCOUNTER — Ambulatory Visit (INDEPENDENT_AMBULATORY_CARE_PROVIDER_SITE_OTHER): Payer: Medicare Other | Admitting: Pulmonary Disease

## 2018-06-20 VITALS — BP 134/72 | HR 89 | Ht 67.0 in | Wt 152.0 lb

## 2018-06-20 DIAGNOSIS — R918 Other nonspecific abnormal finding of lung field: Secondary | ICD-10-CM | POA: Diagnosis not present

## 2018-06-20 DIAGNOSIS — J449 Chronic obstructive pulmonary disease, unspecified: Secondary | ICD-10-CM | POA: Diagnosis not present

## 2018-06-20 DIAGNOSIS — R400 Somnolence: Secondary | ICD-10-CM | POA: Diagnosis not present

## 2018-06-20 DIAGNOSIS — D89813 Graft-versus-host disease, unspecified: Secondary | ICD-10-CM | POA: Diagnosis not present
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
## 2018-06-20 NOTE — Patient Instructions (Signed)
Obstructive sleep apnea: Your sleep study showed that you stopped breathing about 20 times per hour Your O2 saturation was at a low level for more than 500 minutes At a minimum I would recommend using nocturnal oxygen You could try positional therapy: Sleeping on the left side or using a tennis ball in the back of your sleep sure to try to minimize time in the supine position If you are interested we can make a recommendation for you to be seen by a board-certified sleep apnea dentist for a dental appliance device Otherwise, the technology and CPAP therapy has changed quite a bit over the last few years and newer devices may be more tolerable than what she used to previously  Centrilobular emphysema: Call us if you have problems with increasing chest congestion or mucus production From my standpoint I do not think you need to use the Anoro Get a flu shot in the fall Practice good hand hygiene  Status post stem cell transplant: We will arrange for another lung function test around January or February 2020 and see you after that  Come back in January or February 2020

## 2018-06-22 ENCOUNTER — Telehealth: Payer: Self-pay | Admitting: Pulmonary Disease

## 2018-06-22 DIAGNOSIS — G4733 Obstructive sleep apnea (adult) (pediatric): Secondary | ICD-10-CM

## 2018-06-22 NOTE — Telephone Encounter (Signed)
Spoke with pt, he states he saw BQ last week to go over the results of the home sleep study. He has considered his decision and would like an order for auto CPAP. Previous telephone note states his o2 level dropped as well  But CY recommended to get adjusted to CPAP first and if his O2 is still low at night then a CPAP titration can be ordered. Did you ? BQ would ONO on CPAP need to be ordered to check pt's O2 level after CPAP is set up? Please advise.   Order for CPAP placed.

## 2018-06-22 NOTE — Telephone Encounter (Signed)
Repeat ONO on CPAP in 3 weeks

## 2018-06-22 NOTE — Telephone Encounter (Signed)
CPAP order was placed and sent to [facility] today. Do you want patient to wear CPAP and decide at 3 week download to do further testing for O2 needs?  Thanks

## 2018-06-22 NOTE — Telephone Encounter (Signed)
Order has been placed with [facility] to get the pt set up with CPAP.  Order has been placed for the ONO to be done AFTER the pt has been on the cpap for a FULL 3 weeks.  I have called the pt and lmomtcb x 1 to make him aware of this.

## 2018-06-22 NOTE — Telephone Encounter (Signed)
Order for autotitrating CPAP machine 5-20 cm H20 resmed 10 with a download in 3 weeks

## 2018-06-22 NOTE — Telephone Encounter (Signed)
Pt called back and he is aware of plan for cpap and ONO. Nothing further is needed.

## 2018-06-26 ENCOUNTER — Other Ambulatory Visit: Admit: 2018-06-26 | Discharge: 2018-06-26 | Payer: MEDICARE

## 2018-06-26 ENCOUNTER — Ambulatory Visit
Admit: 2018-06-26 | Discharge: 2018-06-26 | Payer: MEDICARE | Attending: Hematology & Oncology | Primary: Hematology & Oncology

## 2018-06-26 ENCOUNTER — Ambulatory Visit: Admit: 2018-06-26 | Discharge: 2018-06-26 | Payer: MEDICARE

## 2018-06-26 DIAGNOSIS — C9201 Acute myeloblastic leukemia, in remission: Secondary | ICD-10-CM

## 2018-06-26 DIAGNOSIS — M8580 Other specified disorders of bone density and structure, unspecified site: Secondary | ICD-10-CM

## 2018-06-26 DIAGNOSIS — D89813 Graft-versus-host disease, unspecified: Secondary | ICD-10-CM

## 2018-06-26 DIAGNOSIS — Z9484 Stem cells transplant status: Secondary | ICD-10-CM

## 2018-06-26 DIAGNOSIS — T451X5A Adverse effect of antineoplastic and immunosuppressive drugs, initial encounter: Secondary | ICD-10-CM

## 2018-06-26 DIAGNOSIS — D89810 Acute graft-versus-host disease: Secondary | ICD-10-CM

## 2018-06-26 DIAGNOSIS — D899 Disorder involving the immune mechanism, unspecified: Secondary | ICD-10-CM

## 2018-06-26 DIAGNOSIS — C9200 Acute myeloblastic leukemia, not having achieved remission: Secondary | ICD-10-CM

## 2018-06-26 DIAGNOSIS — I10 Essential (primary) hypertension: Principal | ICD-10-CM

## 2018-06-26 DIAGNOSIS — D701 Agranulocytosis secondary to cancer chemotherapy: Secondary | ICD-10-CM

## 2018-06-26 DIAGNOSIS — K4090 Unilateral inguinal hernia, without obstruction or gangrene, not specified as recurrent: Secondary | ICD-10-CM

## 2018-06-26 DIAGNOSIS — H2513 Age-related nuclear cataract, bilateral: Secondary | ICD-10-CM

## 2018-06-26 MED ORDER — PREDNISONE 1 MG TABLET
ORAL_TABLET | Freq: Every day | ORAL | 1 refills | 0.00000 days | Status: CP
Start: 2018-06-26 — End: ?

## 2018-07-02 ENCOUNTER — Ambulatory Visit: Admit: 2018-07-02 | Discharge: 2018-07-03 | Payer: MEDICARE

## 2018-07-02 DIAGNOSIS — A0471 Enterocolitis due to Clostridium difficile, recurrent: Principal | ICD-10-CM

## 2018-07-06 ENCOUNTER — Ambulatory Visit: Admit: 2018-07-06 | Discharge: 2018-07-07 | Payer: MEDICARE

## 2018-07-06 DIAGNOSIS — H2513 Age-related nuclear cataract, bilateral: Secondary | ICD-10-CM

## 2018-07-06 DIAGNOSIS — H11823 Conjunctivochalasis, bilateral: Principal | ICD-10-CM

## 2018-07-09 ENCOUNTER — Telehealth: Payer: Self-pay | Admitting: Pulmonary Disease

## 2018-07-09 NOTE — Telephone Encounter (Signed)
Called and spoke with Patient to let him know that [facility] will be calling back in the morning to follow up on ONO, and someone from our office would be calling him back tomorrow.

## 2018-07-09 NOTE — Telephone Encounter (Signed)
Called and spoke with Patient.  Patient was stating that someone had told him after he started his CPAP to let us know, so he can have an ONO.  Patient started CPAP 07/05/18.  Called and spoke with [facility], got operator after hours person.  She stated that she would have someone look in to it and someone will call back. Order for ONO placed 06/22/18.

## 2018-07-10 NOTE — Telephone Encounter (Signed)
Called and spoke with Ailene Ravel with [facility] at phone [phone] ext [phone] She advised that pt picked up new cpap machine with [facility] on 07/05/2018 She states she will call pt today to schedule ONO, at this time ONO has not been complete  Attempted to call patient today regarding above call with [facility]. I did not receive an answer at time of call. I have left a voicemail message for pt to return call. X1

## 2018-07-11 NOTE — Telephone Encounter (Signed)
Attempted to call patient today regarding ONO to be scheduled with [facility] I did not receive an answer at time of call. I have left a voicemail message for pt to return call. X2  Attempted to call Corene Cornea with [facility] [phone] ext [phone] to see if ONO been scheduled. I did not receive an answer at time of call. I have left a voicemail message for pt to return call. X1

## 2018-07-12 NOTE — Telephone Encounter (Signed)
Attempted to call pt. I did not receive an answer. I have left a message for pt to return our call.  

## 2018-07-13 NOTE — Telephone Encounter (Signed)
Attempted to contact pt. I did not receive an answer. There was no option for me to leave a message. Will try back.  

## 2018-07-16 NOTE — Telephone Encounter (Signed)
We have attempted to contact the pt several times with no success or call back from the pt. Per triage protocol, message will be closed.  

## 2018-07-26 ENCOUNTER — Telehealth: Payer: Self-pay | Admitting: Pulmonary Disease

## 2018-07-26 MED ORDER — RIFAXIMIN 550 MG TABLET
ORAL_TABLET | Freq: Three times a day (TID) | ORAL | 0 refills | 0 days | Status: CP
Start: 2018-07-26 — End: 2018-08-09

## 2018-07-26 NOTE — Telephone Encounter (Signed)
Called and spoke to patient to relay results of ONO.  His ONO on CPAP on room air showed that his oxygen level dropped below 88% for 1 hour and 45 minutes. Dr. Lake Bells is suggesting patient to have 2 L of 02 bled into CPAP.   Patient stated he doesn't know if he wants to do that and he questions if the oxygen results are correct.  Patient also having some acute symptoms.  Scheduled appointment with Wyn Quaker, NP for tomorrow.  Routing to [employer] as FYI for tomorrow's appointment.

## 2018-07-26 NOTE — Telephone Encounter (Signed)
Noted. Will discuss with pt tomorrow.   Wyn Quaker FNP

## 2018-07-27 ENCOUNTER — Other Ambulatory Visit (INDEPENDENT_AMBULATORY_CARE_PROVIDER_SITE_OTHER): Payer: Medicare Other

## 2018-07-27 ENCOUNTER — Encounter: Payer: Self-pay | Admitting: Pulmonary Disease

## 2018-07-27 ENCOUNTER — Ambulatory Visit (INDEPENDENT_AMBULATORY_CARE_PROVIDER_SITE_OTHER): Payer: Medicare Other | Admitting: Pulmonary Disease

## 2018-07-27 VITALS — BP 100/62 | HR 86 | Ht 67.0 in | Wt 152.0 lb

## 2018-07-27 DIAGNOSIS — G4734 Idiopathic sleep related nonobstructive alveolar hypoventilation: Secondary | ICD-10-CM

## 2018-07-27 DIAGNOSIS — J449 Chronic obstructive pulmonary disease, unspecified: Secondary | ICD-10-CM | POA: Insufficient documentation

## 2018-07-27 DIAGNOSIS — J9601 Acute respiratory failure with hypoxia: Secondary | ICD-10-CM | POA: Diagnosis not present

## 2018-07-27 DIAGNOSIS — G4733 Obstructive sleep apnea (adult) (pediatric): Secondary | ICD-10-CM | POA: Diagnosis not present

## 2018-07-27 DIAGNOSIS — R0609 Other forms of dyspnea: Secondary | ICD-10-CM

## 2018-07-27 LAB — CBC
HCT: 34.1 % — ABNORMAL LOW (ref 39.0–52.0)
Hemoglobin: 11.3 g/dL — ABNORMAL LOW (ref 13.0–17.0)
MCHC: 33.2 g/dL (ref 30.0–36.0)
MCV: 104.1 fl — AB (ref 78.0–100.0)
Platelets: 258 10*3/uL (ref 150.0–400.0)
RBC: 3.27 Mil/uL — AB (ref 4.22–5.81)
RDW: 14.3 % (ref 11.5–15.5)
WBC: 10.2 10*3/uL (ref 4.0–10.5)

## 2018-07-27 NOTE — Assessment & Plan Note (Signed)
CBC today.  

## 2018-07-27 NOTE — Assessment & Plan Note (Signed)

## 2018-07-27 NOTE — Progress Notes (Signed)
Reviewed, discussed agree

## 2018-07-27 NOTE — Progress Notes (Signed)
Was able to talk to patient regarding patient's results.  They verbalized an understanding of what was discussed. No further questions at this time. 

## 2018-07-27 NOTE — Patient Instructions (Addendum)
Will place an order to your [facility] for 2 L nighttime O2  Keep follow-up with Dr. Lake Bells in September/2019  Proceed forward with pulmonary function test in January/February 2020  CBC today    Note your daily symptoms > remember "red flags" for COPD:   >>>Increase in cough >>>increase in sputum production >>>increase in shortness of breath or activity  intolerance.   If you notice these symptoms, please call the office to be seen.     We recommend that you continue using your CPAP daily >>>Keep up the hard work using your device >>> Goal should be wearing this for the entire night that you are sleeping, at least 4 to 6 hours  Remember:  . Do not drive or operate heavy machinery if tired or drowsy.  . Please notify the supply company and office if you are unable to use your device regularly due to missing supplies or machine being broken.  . Work on maintaining a healthy weight and following your recommended nutrition plan  . Maintain proper daily exercise and movement  . Maintaining proper use of your device can also help improve management of other chronic illnesses such as: Blood pressure, blood sugars, and weight management.   BiPAP/ CPAP Cleaning:  >>>Clean weekly, with Dawn soap, and bottle brush.  Set up to air dry.        It is flu season:   >>>Remember to be washing your hands regularly, using hand sanitizer, be careful to use around herself with has contact with people who are sick will increase her chances of getting sick yourself. >>> Best ways to protect herself from the flu: Receive the yearly flu vaccine, practice good hand hygiene washing with soap and also using hand sanitizer when available, eat a nutritious meals, get adequate rest, hydrate appropriately   Please contact the office if your symptoms worsen or you have concerns that you are not improving.   Thank you for choosing [facility] for your healthcare, and for  allowing [place] to partner with you on your healthcare journey. I am thankful to be able to provide care to you today.   Wyn Quaker FNP-C     CPAP and BiPAP Information CPAP and BiPAP are methods of helping a person breathe with the use of air pressure. CPAP stands for "continuous positive airway pressure." BiPAP stands for "bi-level positive airway pressure." In both methods, air is blown through your nose or mouth and into your air passages to help you breathe well. CPAP and BiPAP use different amounts of pressure to blow air. With CPAP, the amount of pressure stays the same while you breathe in and out. With BiPAP, the amount of pressure is increased when you breathe in (inhale) so that you can take larger breaths. Your health care provider will recommend whether CPAP or BiPAP would be more helpful for you. Why are CPAP and BiPAP treatments used? CPAP or BiPAP can be helpful if you have:  Sleep apnea.  Chronic obstructive pulmonary disease (COPD).  Heart failure.  Medical conditions that weaken the muscles of the chest including muscular dystrophy, or neurological diseases such as amyotrophic lateral sclerosis (ALS).  Other problems that cause breathing to be weak, abnormal, or difficult.  CPAP is most commonly used for obstructive sleep apnea (OSA) to keep the airways from collapsing when the muscles relax during sleep. How is CPAP or BiPAP administered? Both CPAP and BiPAP are provided by a small machine with a flexible  plastic tube that attaches to a plastic mask. You wear the mask. Air is blown through the mask into your nose or mouth. The amount of pressure that is used to blow the air can be adjusted on the machine. Your health care provider will determine the pressure setting that should be used based on your individual needs. When should CPAP or BiPAP be used? In most cases, the mask only needs to be worn during sleep. Generally, the mask needs to be worn throughout the night and  during any daytime naps. People with certain medical conditions may also need to wear the mask at other times when they are awake. Follow instructions from your health care provider about when to use the machine. What are some tips for using the mask?  Because the mask needs to be snug, some people feel trapped or closed-in (claustrophobic) when first using the mask. If you feel this way, you may need to get used to the mask. One way to do this is by holding the mask loosely over your nose or mouth and then gradually applying the mask more snugly. You can also gradually increase the amount of time that you use the mask.  Masks are available in various types and sizes. Some fit over your mouth and nose while others fit over just your nose. If your mask does not fit well, talk with your health care provider about getting a different one.  If you are using a mask that fits over your nose and you tend to breathe through your mouth, a chin strap may be applied to help keep your mouth closed.  The CPAP and BiPAP machines have alarms that may sound if the mask comes off or develops a leak.  If you have trouble with the mask, it is very important that you talk with your health care provider about finding a way to make the mask easier to tolerate. Do not stop using the mask. Stopping the use of the mask could have a negative impact on your health. What are some tips for using the machine?  Place your CPAP or BiPAP machine on a secure table or stand near an electrical outlet.  Know where the on/off switch is located on the machine.  Follow instructions from your health care provider about how to set the pressure on your machine and when you should use it.  Do not eat or drink while the CPAP or BiPAP machine is on. Food or fluids could get pushed into your lungs by the pressure of the CPAP or BiPAP.  Do not smoke. Tobacco smoke residue can damage the machine.  For home use, CPAP and BiPAP machines can  be rented or purchased through home health care companies. Many different brands of machines are available. Renting a machine before purchasing may help you find out which particular machine works well for you.  Keep the CPAP or BiPAP machine and attachments clean. Ask your health care provider for specific instructions. Get help right away if:  You have redness or open areas around your nose or mouth where the mask fits.  You have trouble using the CPAP or BiPAP machine.  You cannot tolerate wearing the CPAP or BiPAP mask.  You have pain, discomfort, and bloating in your abdomen. Summary  CPAP and BiPAP are methods of helping a person breathe with the use of air pressure.  Both CPAP and BiPAP are provided by a small machine with a flexible plastic tube that attaches to a  plastic mask.  If you have trouble with the mask, it is very important that you talk with your health care provider about finding a way to make the mask easier to tolerate. This information is not intended to replace advice given to you by your health care provider. Make sure you discuss any questions you have with your health care provider. Document Released: 08/12/2004 Document Revised: 10/03/2016 Document Reviewed: 10/03/2016 Elsevier Interactive Patient Education  2017 [employer] Chronic Obstructive Pulmonary Disease Chronic obstructive pulmonary disease (COPD) is a long-term (chronic) lung problem. When you have COPD, it is hard for air to get in and out of your lungs. The way your lungs work will never return to normal. Usually the condition gets worse over time. There are things you can do to keep yourself as healthy as possible. Your doctor may treat your condition with:  Medicines.  Quitting smoking, if you smoke.  Rehabilitation. This may involve a team of specialists.  Oxygen.  Exercise and changes to your diet.  Lung surgery.  Comfort measures (palliative care).  Follow these instructions at  home: Medicines  Take over-the-counter and prescription medicines only as told by your doctor.  Talk to your doctor before taking any cough or allergy medicines. You may need to avoid medicines that cause your lungs to be dry. Lifestyle  If you smoke, stop. Smoking makes the problem worse. If you need help quitting, ask your doctor.  Avoid being around things that make your breathing worse. This may include smoke, chemicals, and fumes.  Stay active, but remember to also rest.  Learn and use tips on how to relax.  Make sure you get enough sleep. Most adults need at least 7 hours a night.  Eat healthy foods. Eat smaller meals more often. Rest before meals. Controlled breathing  Learn and use tips on how to control your breathing as told by your doctor. Try: ? Breathing in (inhaling) through your nose for 1 second. Then, pucker your lips and breath out (exhale) through your lips for 2 seconds. ? Putting one hand on your belly (abdomen). Breathe in slowly through your nose for 1 second. Your hand on your belly should move out. Pucker your lips and breathe out slowly through your lips. Your hand on your belly should move in as you breathe out. Controlled coughing  Learn and use controlled coughing to clear mucus from your lungs. The steps are: 1. Lean your head a little forward. 2. Breathe in deeply. 3. Try to hold your breath for 3 seconds. 4. Keep your mouth slightly open while coughing 2 times. 5. Spit any mucus out into a tissue. 6. Rest and do the steps again 1 or 2 times as needed. General instructions  Make sure you get all the shots (vaccines) that your doctor recommends. Ask your doctor about a flu shot and a pneumonia shot.  Use oxygen therapy and therapy to help improve your lungs (pulmonary rehabilitation) if told by your doctor. If you need home oxygen therapy, ask your doctor if you should buy a tool to measure your oxygen level (oximeter).  Make a COPD action plan  with your doctor. This helps you know what to do if you feel worse than usual.  Manage any other conditions you have as told by your doctor.  Avoid going outside when it is very hot, cold, or humid.  Avoid people who have a sickness you can catch (contagious).  Keep all follow-up visits as told by your doctor. This  is important. Contact a doctor if:  You cough up more mucus than usual.  There is a change in the color or thickness of the mucus.  It is harder to breathe than usual.  Your breathing is faster than usual.  You have trouble sleeping.  You need to use your medicines more often than usual.  You have trouble doing your normal activities such as getting dressed or walking around the house. Get help right away if:  You have shortness of breath while resting.  You have shortness of breath that stops you from: ? Being able to talk. ? Doing normal activities.  Your chest hurts for longer than 5 minutes.  Your skin color is more blue than usual.  Your pulse oximeter shows that you have low oxygen for longer than 5 minutes.  You have a fever.  You feel too tired to breathe normally. Summary  Chronic obstructive pulmonary disease (COPD) is a long-term lung problem.  The way your lungs work will never return to normal. Usually the condition gets worse over time. There are things you can do to keep yourself as healthy as possible.  Take over-the-counter and prescription medicines only as told by your doctor.  If you smoke, stop. Smoking makes the problem worse. This information is not intended to replace advice given to you by your health care provider. Make sure you discuss any questions you have with your health care provider. Document Released: 05/02/2008 Document Revised: 04/21/2016 Document Reviewed: 07/11/2013 Elsevier Interactive Patient Education  2017 [employer].

## 2018-07-27 NOTE — Progress Notes (Signed)
Your blood work results of come back.  We will forward these results to your primary care doctor.  Your results did show slightly decreased macrocytic anemia from results 3 months ago.    Hemoglobin today is 11.3.  Your primary care doctor can repeat this test in a month to see if your results are worsening.  Wyn Quaker FNP

## 2018-07-27 NOTE — Assessment & Plan Note (Signed)
  Keep follow-up with Dr. Lake Bells in September/2019  Proceed forward with pulmonary function test in January/February 2020  Note your daily symptoms > remember "red flags" for COPD:   >>>Increase in cough >>>increase in sputum production >>>increase in shortness of breath or activity  intolerance.   If you notice these symptoms, please call the office to be seen.

## 2018-07-27 NOTE — Assessment & Plan Note (Signed)
Will place an order to your DME [facility] for 2 L nighttime O2  Keep follow-up with Dr. Lake Bells in September/2019

## 2018-07-27 NOTE — Progress Notes (Signed)
@Patient  ID: Warren [name], male    DOB: [birth year], 77 y.o.   MRN: [id number]  Chief Complaint  Patient presents with  . Acute Visit    sob, not feeling improved with CPAP    Referring provider: Wenda Low, MD  HPI:  77 year old male patient followed in our office for mild COPD  PMH: Staph bacteremia (2017), myelodysplastic syndrome, stem cell transplant at [facility] in 2018 Smoker/ Smoking History: Former smoker.  40 pack years. Maintenance: Prednisone 3mg , Anoro Ellipta inhaler  Pt of: Dr. Lake Bells  Recent [place] [facility] Encounters:   06/20/2018-office visit-[name] Former patient of Dr. Annamaria Boots.  Presenting today been traveling more and enjoying life more.  Does not feel to limited by breathing.  He says he had bronchitis briefly back in May/2019.  He is not adherent to taking his Anoro Ellipta daily.  Patient does not feel that the Anoro Ellipta helps much.  Patient would like to go over sleep study results today. Plan: Sleep study results reviewed today you stop breathing about 20 times per hour, your oxygen desaturation was at a low level for more than 500 minutes, I would recommend using nocturnal oxygen, discussed oral appliance versus CPAP therapy, Joselyn Arrow with CPAP ordered, get flu shot in the fall, can discontinue Anoro Ellipta, repeat pulmonary function test in January/February 2020  07/27/2018  - Visit   77 year old patient presents today for acute visit of shortness of breath.  Patient reports that he has had the symptoms for the last 6 to 12 months.  Patient has had an extensive pulmonary work-up.  Patient's main complaint today is that he is noticing that he is not able to do his much as what he used to in the past.  For instance patient was able to walk 1/2 miles today had a 20-minute pace which is a change from 3 months ago when he was able to walk 3 miles at a 17-minute pace.  Patient reports that he still exercising 6 out of 7 days a week.  Patient is exercising for  about an hour each day.  Patient is also noticed a decrease in his incentive spirometer use.  Patient was able to achieve 3000 about 3 to 6 months ago and now is down to 2000 consistently.  The patient is concerned about this.  Patient is still not using a controller inhaler at this time.  Patient does not feel that he seen any improvement with that.  Patient compliance report showing 21 last 30 days used with all 21 of those days greater than 4 hours.  Average usage days 8 hours of 12 minutes.  APAP pressures 5-20.  AHI 1.8.  Patient recently completed an overnight oximetry test.  This overnight oximetry test showed that patient needs oxygen at night.  Patient is apprehensive about doing that at this time.  Patient would like to discuss these results today.  20 min / 1.5 mi 17 min / 3 mi 3 months ago   3000 IS in the past and 2000 now   Reviewed   Tests:   05/02/18 - Home sleep study - AHI 19.9-hour  06/2018 - ONO on CPAP on room air showed that his oxygen level dropped below 88% for 1 hour and 45 minutes.  >>>Dr. Lake Bells is suggesting patient to have 2 L of 02 bled into CPAP.   PFT: April 2017 ratio 64%, FEV1 2.69 L 87% predicted July 2018 ratio 64%, FEV1 2.6 L, performed at [facility] February 2019 pulmonary function  testing: Ratio 67%, FEV1 2.77 L 108% predicted, FVC 4.11 L 114% predicted, FEF 2575 1.75 L 98% predicted total lung capacity 6.2 L 95% predicted, DLCO 19.5 mL 68% predicted  Chest imaging: August 2018 chest x-ray report from [facility]: Trace bilateral pleural effusion CT chest February 2019 images independently reviewed showing some scattered pulmonary nodules which are small, centrilobular emphysema.  Echo  01/2018 [facility] TTE>  Normal left ventricular systolic function, ejection fraction 60 to 53% Diastolic dysfunction - grade I (normal filling pressures)  Dilated left atrium - mild   Aortic sclerosis  Normal right ventricular systolic function   Chart Review:     Specialty  Problems      Pulmonary Problems   OSA (obstructive sleep apnea)    NPSG 05/21/99- moderate OSA, AHI 19 per hour, weight 165 pounds 05/02/18 - Home sleep study - AHI 19.9-hour 06/2018 - ONO on CPAP on room air showed that his oxygen level dropped below 88% for 1 hour and 45 minutes.  >>>Dr. Lake Bells is suggesting patient to have 2 L of 02 bled into CPAP.        PULMONARY NODULE    Benign granuloma       Acute bronchitis    PFT 10/28/2010: Mild obstructive airways disease in small airways with insignificant response to bronchodilator. Increased diffusion capacity raised question of increased blood flow.  FEV1 3.00/110%, FEV1/FVC 0.61, FEF 25-75% 1.23/49%. TLC 117%, DLCO 167%. Office Spirometry 09/12/13- FVC 4.33/ 105%, FEV1 2.73/ 86%, FEV1/FVC 0.63, FEF25-75% 1.32/ 47% PFT 09/24/2014-within normal limits. Maybe minimal obstructive airways disease and minimal small airway response to bronchodilator. Normal lung volumes and diffusion.       Dyspnea on exertion    See OV 03/09/16      Acute respiratory failure with hypoxia (HCC)   Cough   Lung nodule   Pleural effusion, right   COPD mixed type (HCC)   Nocturnal hypoxia      Allergies  Allergen Reactions  . Fluconazole     Rash   . Midazolam Other (See Comments)    Agitated caused by versed    Immunization History  Administered Date(s) Administered  . DTaP / Hep B / IPV 09/05/2017, 03/27/2018  . HiB (PRP-T) 09/05/2017, 03/27/2018  . Influenza Split 09/04/2012, 09/17/2014, 08/10/2015  . Influenza Whole 08/11/2010, 09/06/2013  . Influenza,inj,Quad PF,6+ Mos 09/05/2017  . Influenza-Unspecified 12/22/2017  . Pneumococcal Conjugate-13 09/18/2015, 09/05/2017, 03/27/2018  . Pneumococcal Polysaccharide-23 10/10/2007    Past Medical History:  Diagnosis Date  . AML (acute myeloblastic leukemia) ([place]) 2018  . Cancer [facility])    Prostate, Melanoma- lt. Shoulder (no further problems since 2010  . Chronic diastolic CHF (congestive  heart failure) ([place]) 09/13/2016  . Complication of anesthesia    versed gave the adverse reaction pt. ,became agitated  . COPD (chronic obstructive pulmonary disease) (HCC)    Dr. Annamaria Boots follows  . Diffuse esophageal spasm   . Diverticulosis   . DJD (degenerative joint disease)   . Multiple trauma     horse accident 2003 L SHOULDER  . Myelodysplasia (myelodysplastic syndrome) ([place])   . Oral herpes   . OSA (obstructive sleep apnea)    mild no CPAP use  . Peripheral neuropathy   . Pulmonary nodule   . Right inguinal hernia 02/27/2018  . Spinal stenosis   . Systolic hypertension     Tobacco History: Social History   Tobacco Use  Smoking Status Former Smoker  . Packs/day: 1.00  . Years: 40.00  .  Pack years: 40.00  . Types: Cigarettes  . Last attempt to quit: 11/29/1995  . Years since quitting: 22.6  Smokeless Tobacco Never Used   Counseling given: Yes   Outpatient Encounter Medications as of 07/27/2018  Medication Sig  . albuterol (PROAIR HFA) 108 (90 Base) MCG/ACT inhaler Inhale 2 puffs into the lungs every 6 (six) hours as needed for wheezing or shortness of breath.  . calcium carbonate (TUMS - DOSED IN MG ELEMENTAL CALCIUM) 500 MG chewable tablet Chew 0.5 tablets by mouth 2 (two) times daily.   . Carboxymethylcellulose Sodium (THERATEARS) 0.25 % SOLN Apply 1 drop to eye 4 (four) times daily.  . Cholecalciferol (VITAMIN D-1000 MAX ST) 1000 units tablet Take 1,000 Units by mouth 2 (two) times daily.   . dapsone 100 MG tablet Take 100 mg by mouth daily.  [name] HYDROcodone-acetaminophen (NORCO) 5-325 MG tablet Take 1-2 tablets by mouth every 6 (six) hours as needed for moderate pain or severe pain.  [name] loratadine (CLARITIN) 10 MG tablet Take 10 mg by mouth daily.  . predniSONE (DELTASONE) 1 MG tablet Take 3 mg by mouth daily with breakfast.  . umeclidinium-vilanterol (ANORO ELLIPTA) 62.5-25 MCG/INH AEPB Inhale 1 puff into the lungs daily.  . valACYclovir (VALTREX) 500 MG tablet TAKE  ONE TABLET BY MOUTH TWICE DAILY AT  NIGHT  TO  SUPPRESS  HERPES  GINGIVITIS  . [DISCONTINUED] predniSONE (DELTASONE) 5 MG tablet 7.5 mg  . famotidine (PEPCID) 20 MG tablet Take 20 mg by mouth 2 (two) times daily.   . [DISCONTINUED] diltiazem (CARDIZEM) 60 MG tablet Take 60 mg by mouth daily.    Facility-Administered Encounter Medications as of 07/27/2018  Medication  . sodium chloride flush (NS) 0.9 % injection 10 mL     Review of Systems  Review of Systems  Constitutional: Positive for activity change and fatigue. Negative for chills, fever and unexpected weight change.  HENT: Negative for postnasal drip and rhinorrhea.   Respiratory: Positive for shortness of breath. Negative for cough and wheezing.   Cardiovascular: Negative for chest pain and palpitations.  Gastrointestinal: Negative for constipation.  Genitourinary: Negative for hematuria and urgency.  Musculoskeletal: Negative for arthralgias.  Skin: Negative for color change.  Neurological: Negative for dizziness, seizures and headaches.  Psychiatric/Behavioral: Negative for dysphoric mood. The patient is not nervous/anxious.   All other systems reviewed and are negative.    Physical Exam  BP 100/62 (BP Location: Right Arm, Cuff Size: Normal)   Pulse 86   Ht 5\' 7"  (1.702 m)   Wt 152 lb (68.9 kg)   SpO2 98% Comment: Forehead probe  BMI 23.81 kg/m   Wt Readings from Last 5 Encounters:  07/27/18 152 lb (68.9 kg)  06/20/18 152 lb (68.9 kg)  04/21/18 145 lb (65.8 kg)  02/27/18 144 lb (65.3 kg)  01/22/18 142 lb 6.4 oz (64.6 kg)     Physical Exam  Constitutional: He is oriented to person, place, and time and well-developed, well-nourished, and in no distress. No distress.  HENT:  Head: Normocephalic and atraumatic.  Right Ear: Hearing, tympanic membrane, external ear and ear canal normal.  Left Ear: Hearing, tympanic membrane, external ear and ear canal normal.  Mouth/Throat: Uvula is midline and oropharynx is clear  and moist. No oropharyngeal exudate.  Eyes: Pupils are equal, round, and reactive to light.  Neck: Normal range of motion. Neck supple. No JVD present.  Cardiovascular: Normal rate, regular rhythm and normal heart sounds.  Pulmonary/Chest: Effort normal and breath sounds  normal. No accessory muscle usage. No respiratory distress. He has no decreased breath sounds. He has no wheezes. He has no rhonchi.  Abdominal: Soft. Bowel sounds are normal. There is no tenderness.  Musculoskeletal: Normal range of motion. He exhibits no edema.  Lymphadenopathy:    He has no cervical adenopathy.  Neurological: He is alert and oriented to person, place, and time. Gait normal.  Skin: Skin is warm and dry. He is not diaphoretic. No erythema.  Psychiatric: Mood, memory, affect and judgment normal.  Nursing note and vitals reviewed.     Lab Results:  CBC    Component Value Date/Time   WBC 10.2 07/27/2018 1442   RBC 3.27 (L) 07/27/2018 1442   HGB 11.3 (L) 07/27/2018 1442   HGB 11.9 (L) 04/13/2018 1436   HGB 8.2 (L) 07/27/2017 1414   HCT 34.1 (L) 07/27/2018 1442   HCT 27.0 (L) 07/27/2017 1414   PLT 258.0 07/27/2018 1442   PLT 163 04/13/2018 1436   PLT 150 07/27/2017 1414   MCV 104.1 (H) 07/27/2018 1442   MCV 113.0 (H) 07/27/2017 1414   MCH 36.1 (H) 04/21/2018 1834   MCHC 33.2 07/27/2018 1442   RDW 14.3 07/27/2018 1442   RDW 22.3 (H) 07/27/2017 1414   LYMPHSABS 0.6 (L) 04/21/2018 1834   LYMPHSABS 0.4 (L) 07/27/2017 1414   MONOABS 0.7 04/21/2018 1834   MONOABS 0.5 07/27/2017 1414   EOSABS 0.0 04/21/2018 1834   EOSABS 0.0 07/27/2017 1414   BASOSABS 0.0 04/21/2018 1834   BASOSABS 0.0 07/27/2017 1414    BMET    Component Value Date/Time   NA 140 04/21/2018 1834   NA 140 07/27/2017 1414   K 5.2 (H) 04/21/2018 1834   K 4.3 07/27/2017 1414   CL 106 04/21/2018 1834   CO2 26 04/21/2018 1834   CO2 29 07/27/2017 1414   GLUCOSE 125 (H) 04/21/2018 1834   GLUCOSE 269 (H) 07/27/2017 1414   BUN  35 (H) 04/21/2018 1834   BUN 37.2 (H) 07/27/2017 1414   CREATININE 1.85 (H) 04/21/2018 1834   CREATININE 1.58 (H) 01/24/2018 1521   CREATININE 1.4 (H) 07/27/2017 1414   CALCIUM 8.9 04/21/2018 1834   CALCIUM 8.6 07/27/2017 1414   GFRNONAA 33 (L) 04/21/2018 1834   GFRNONAA 40 (L) 01/24/2018 1521   GFRAA 39 (L) 04/21/2018 1834   GFRAA 47 (L) 01/24/2018 1521    BNP    Component Value Date/Time   BNP 37.1 09/17/2016 0542    ProBNP    Component Value Date/Time   PROBNP 46.0 03/09/2016 1510    Imaging: No results found.    Assessment & Plan:   Pleasant 77 year old patient seen office visit today.  Patient will proceed forward with 2 L of O2 via CPAP at night.  Patient to keep follow-up in September to show 30 days of CPAP compliance.  Praised patient on a CPAP compliance at this time.  Will get CBC today just to check to ensure patient does not have any worsening anemia with his shortness of breath.  Explained to patient that I do think it is reasonable to repeat pulmonary function testing in January/February 2020.  OSA (obstructive sleep apnea) We recommend that you continue using your CPAP daily >>>Keep up the hard work using your device >>> Goal should be wearing this for the entire night that you are sleeping, at least 4 to 6 hours  Remember:  . Do not drive or operate heavy machinery if tired or drowsy.  . Please  notify the supply company and office if you are unable to use your device regularly due to missing supplies or machine being broken.  . Work on maintaining a healthy weight and following your recommended nutrition plan  . Maintain proper daily exercise and movement  . Maintaining proper use of your device can also help improve management of other chronic illnesses such as: Blood pressure, blood sugars, and weight management.   BiPAP/ CPAP Cleaning:  >>>Clean weekly, with Dawn soap, and bottle brush.  Set up to air dry.    COPD mixed type [facility])  Keep  follow-up with Dr. Lake Bells in September/2019  Proceed forward with pulmonary function test in January/February 2020  Note your daily symptoms > remember "red flags" for COPD:   >>>Increase in cough >>>increase in sputum production >>>increase in shortness of breath or activity  intolerance.   If you notice these symptoms, please call the office to be seen.    Nocturnal hypoxia Will place an order to your DME company [facility] for 2 L nighttime O2  Keep follow-up with Dr. Lake Bells in [place], NP 07/27/2018

## 2018-07-31 ENCOUNTER — Telehealth: Payer: Self-pay | Admitting: Pulmonary Disease

## 2018-07-31 DIAGNOSIS — G4733 Obstructive sleep apnea (adult) (pediatric): Secondary | ICD-10-CM

## 2018-07-31 DIAGNOSIS — J9601 Acute respiratory failure with hypoxia: Secondary | ICD-10-CM

## 2018-07-31 DIAGNOSIS — R0600 Dyspnea, unspecified: Secondary | ICD-10-CM

## 2018-07-31 NOTE — Telephone Encounter (Signed)
Warren [name] is calling back [phone]

## 2018-07-31 NOTE — Telephone Encounter (Signed)
Received a call from [place] with the [facility] bone [facility] transplant [facility]. She stated that patient has been reporting some increased symptoms of issues with breathing along with some other symptoms such as dry mouth.  She stated that Dr. Ok Edwards was thinking COPD or graph vs host per Suanne Marker and is asking if you would order the following tests: PFT (full) and CT chest.  Dr. Lake Bells please advise.

## 2018-07-31 NOTE — Telephone Encounter (Signed)
This is very different from what he told me in July. PFT now: reason dyspnea Have him try taking Anoro daily again after the PFT Make sure he has a rescue inhaler and is using it Have him follow up with me after the PFT, can use a blocked slot Hold off on CT until I see him

## 2018-07-31 NOTE — Telephone Encounter (Signed)
Attempted to call Suanne Marker back with [facility], no answer, left message to call back with more information.

## 2018-07-31 NOTE — Telephone Encounter (Signed)
Spoke with pt. He is aware of BQ's response. Pt has been scheduled for his PFT at 1:00pm on 08/01/18 and he will see BQ at 2:00pm. Nothing further was needed.

## 2018-07-31 NOTE — Telephone Encounter (Signed)
Spoke with Corene Cornea at [facility] pt will need CPAP titration with O2 check in order to get O2 at night. Aaron Edelman is aware as well.   Corene Cornea notes that patient wants to pay out of pocket until CPAP titration is done. Corene Cornea needs new order on template.    Order has been placed.

## 2018-07-31 NOTE — Telephone Encounter (Signed)
Called and spoke with Corene Cornea in regards to patient, he stated that he just spoke with Joellen Jersey and this was being taken care of. Will route to Katie to follow up on.

## 2018-08-01 ENCOUNTER — Ambulatory Visit (INDEPENDENT_AMBULATORY_CARE_PROVIDER_SITE_OTHER): Payer: Medicare Other | Admitting: Pulmonary Disease

## 2018-08-01 ENCOUNTER — Telehealth: Payer: Self-pay | Admitting: Pulmonary Disease

## 2018-08-01 ENCOUNTER — Other Ambulatory Visit (INDEPENDENT_AMBULATORY_CARE_PROVIDER_SITE_OTHER): Payer: Medicare Other

## 2018-08-01 ENCOUNTER — Encounter: Payer: Self-pay | Admitting: Pulmonary Disease

## 2018-08-01 VITALS — BP 102/60 | HR 86 | Ht 66.0 in | Wt 150.0 lb

## 2018-08-01 DIAGNOSIS — R0902 Hypoxemia: Secondary | ICD-10-CM | POA: Diagnosis not present

## 2018-08-01 DIAGNOSIS — J9601 Acute respiratory failure with hypoxia: Secondary | ICD-10-CM | POA: Diagnosis not present

## 2018-08-01 DIAGNOSIS — J449 Chronic obstructive pulmonary disease, unspecified: Secondary | ICD-10-CM

## 2018-08-01 DIAGNOSIS — G4733 Obstructive sleep apnea (adult) (pediatric): Secondary | ICD-10-CM

## 2018-08-01 DIAGNOSIS — R0600 Dyspnea, unspecified: Secondary | ICD-10-CM | POA: Diagnosis not present

## 2018-08-01 DIAGNOSIS — J984 Other disorders of lung: Secondary | ICD-10-CM | POA: Diagnosis not present

## 2018-08-01 DIAGNOSIS — D89813 Graft-versus-host disease, unspecified: Secondary | ICD-10-CM

## 2018-08-01 LAB — CBC WITH DIFFERENTIAL/PLATELET
Basophils Absolute: 0.1 10*3/uL (ref 0.0–0.1)
Basophils Relative: 1.3 % (ref 0.0–3.0)
EOS ABS: 0.3 10*3/uL (ref 0.0–0.7)
Eosinophils Relative: 2.5 % (ref 0.0–5.0)
HCT: 32.4 % — ABNORMAL LOW (ref 39.0–52.0)
Hemoglobin: 10.7 g/dL — ABNORMAL LOW (ref 13.0–17.0)
LYMPHS ABS: 0.7 10*3/uL (ref 0.7–4.0)
Lymphocytes Relative: 6.5 % — ABNORMAL LOW (ref 12.0–46.0)
MCHC: 33.1 g/dL (ref 30.0–36.0)
MCV: 102.9 fl — AB (ref 78.0–100.0)
MONOS PCT: 11.2 % (ref 3.0–12.0)
Monocytes Absolute: 1.2 10*3/uL — ABNORMAL HIGH (ref 0.1–1.0)
NEUTROS ABS: 8.7 10*3/uL — AB (ref 1.4–7.7)
Neutrophils Relative %: 78.5 % — ABNORMAL HIGH (ref 43.0–77.0)
PLATELETS: 305 10*3/uL (ref 150.0–400.0)
RBC: 3.15 Mil/uL — ABNORMAL LOW (ref 4.22–5.81)
RDW: 14.3 % (ref 11.5–15.5)
WBC: 11.1 10*3/uL — ABNORMAL HIGH (ref 4.0–10.5)

## 2018-08-01 LAB — PULMONARY FUNCTION TEST
DL/VA % PRED: 65 %
DL/VA: 2.84 ml/min/mmHg/L
DLCO cor % pred: 49 %
DLCO cor: 13.31 ml/min/mmHg
DLCO unc % pred: 44 %
DLCO unc: 11.88 ml/min/mmHg
FEF 25-75 Post: 1.47 L/sec
FEF 25-75 Pre: 1.49 L/sec
FEF2575-%CHANGE-POST: -1 %
FEF2575-%PRED-POST: 88 %
FEF2575-%Pred-Pre: 89 %
FEV1-%Change-Post: 0 %
FEV1-%Pred-Post: 106 %
FEV1-%Pred-Pre: 105 %
FEV1-PRE: 2.55 L
FEV1-Post: 2.56 L
FEV1FVC-%Change-Post: 4 %
FEV1FVC-%PRED-PRE: 96 %
FEV6-%Change-Post: -2 %
FEV6-%PRED-POST: 109 %
FEV6-%Pred-Pre: 112 %
FEV6-POST: 3.47 L
FEV6-Pre: 3.54 L
FEV6FVC-%Change-Post: 2 %
FEV6FVC-%PRED-POST: 106 %
FEV6FVC-%Pred-Pre: 103 %
FVC-%CHANGE-POST: -3 %
FVC-%PRED-POST: 103 %
FVC-%Pred-Pre: 107 %
FVC-Post: 3.53 L
FVC-Pre: 3.67 L
PRE FEV1/FVC RATIO: 69 %
Post FEV1/FVC ratio: 72 %
Post FEV6/FVC ratio: 99 %
Pre FEV6/FVC Ratio: 97 %
RV % PRED: 33 %
RV: 0.8 L
TLC % pred: 67 %
TLC: 4.24 L

## 2018-08-01 LAB — BRAIN NATRIURETIC PEPTIDE: Pro B Natriuretic peptide (BNP): 40 pg/mL (ref 0.0–100.0)

## 2018-08-01 LAB — BASIC METABOLIC PANEL
BUN: 29 mg/dL — AB (ref 6–23)
CALCIUM: 8.3 mg/dL — AB (ref 8.4–10.5)
CO2: 29 mEq/L (ref 19–32)
Chloride: 103 mEq/L (ref 96–112)
Creatinine, Ser: 1.69 mg/dL — ABNORMAL HIGH (ref 0.40–1.50)
GFR: 41.81 mL/min — AB (ref >60.00)
GLUCOSE: 125 mg/dL — AB (ref 70–99)
Potassium: 4.4 mEq/L (ref 3.5–5.1)
Sodium: 137 mEq/L (ref 135–145)

## 2018-08-01 LAB — D-DIMER, QUANTITATIVE: D-Dimer, Quant: 4.65 mcg/mL FEU — ABNORMAL HIGH (ref <0.50)

## 2018-08-01 NOTE — Telephone Encounter (Signed)
I spoke to Dr. Darol [name] at [facility] who knows his situation well.  I explained my concern over his worsening dyspnea/hypoxemia and explained my plan for work up over the next two days.  He says that if there is concern for GVHD of the lung that 1mg /kg of a systemic steroid (prednisone) would be a reasonable plan.

## 2018-08-01 NOTE — Progress Notes (Signed)
PFT done today. 

## 2018-08-01 NOTE — Patient Instructions (Signed)
Chronic respiratory failure with hypoxemia: Use Oxygen with CPAP Use 3L O2 with exertion  Worsening shortness of breath in the setting of allogenic bone marrow transplant and new restrictive lung disease: High-resolution CT stat Echocardiogram stat to look for evidence of pulmonary hypertension CBC now D-dimer now BNP now  Obstructive sleep apnea: Continue CPAP with oxygen We will follow-up the results of the CPAP titration study is required by her insurance company  I will continue to reach out to the following individual: Nurse navigator at [facility]: [place]  Follow up on Friday with an NP to go over these results. Alternatively if you can get an appointment in [place] on Friday I think it would be a good idea to be seen there.

## 2018-08-01 NOTE — Progress Notes (Signed)
Subjective:    Patient ID: Warren [name], male    DOB: 1941/10/23, 77 y.o.   MRN: [id number]  Synopsis: Former patient of Dr. Annamaria Boots for mild COPD who developed myelodysplastic syndrome. Evaluated by [facility] while hospitalized at [facility] in October 2017 for staph bacteremia. During that hospitalization he had bilateral pleural effusions.  After this hospitalization he had a stem cell transplant at [facility] in 2018.    HPI Chief Complaint  Patient presents with  . Follow-up    feeling a little short of breath   Warren [name] has had a sudden decline in shortness of breath over the last several weeks.  He tells me that after he saw me in November that he started noticing worsening shortness of breath when he exerts himself.  Specifically he says that he was walking approximately 3 miles at 17 minutes per mile when he saw me on July 24 but in the last week he is only been able to walk 1-1/2 miles a 20 minutes per mile because of worsening shortness of breath.  He denies cough.  He says there is been no fevers no wheezing.  He says that he has noticed some chest tightness from time to time.  He says that his leg swelling has not changed.  He started using CPAP as well.  He had lung function testing today and was here to see me immediately afterwards.  Past Medical History:  Diagnosis Date  . AML (acute myeloblastic leukemia) ([place]) 2018  . Cancer [facility])    Prostate, Melanoma- lt. Shoulder (no further problems since 2010  . Chronic diastolic CHF (congestive heart failure) ([place]) 09/13/2016  . Complication of anesthesia    versed gave the adverse reaction pt. ,became agitated  . COPD (chronic obstructive pulmonary disease) (HCC)    Dr. Annamaria Boots follows  . Diffuse esophageal spasm   . Diverticulosis   . DJD (degenerative joint disease)   . Multiple trauma     horse accident 2003 L SHOULDER  . Myelodysplasia (myelodysplastic syndrome) ([place])   . Oral herpes   . OSA (obstructive sleep  apnea)    mild no CPAP use  . Peripheral neuropathy   . Pulmonary nodule   . Right inguinal hernia 02/27/2018  . Spinal stenosis   . Systolic hypertension       Review of Systems  Constitutional: Negative for diaphoresis, fatigue and fever.  HENT: Negative for rhinorrhea, sinus pressure and sinus pain.   Respiratory: Positive for shortness of breath. Negative for choking and wheezing.   Cardiovascular: Negative for chest pain, palpitations and leg swelling.       Objective:   Physical Exam  Vitals:   08/01/18 1423  BP: 102/60  Pulse: 86  SpO2: (!) 85%  Weight: 150 lb (68 kg)  Height: 5\' 6"  (1.676 m)   RA  Gen: chronically ill appearing HENT: OP clear, TM's clear, neck supple PULM: Fine crackles bases of lungs B, normal percussion CV: RRR, no mgr, trace edema GI: BS+, soft, nontender Derm: no cyanosis or rash Psyche: normal mood and affect    PFT: April 2017 ratio 64%, FEV1 2.69 L 87% predicted July 2018 ratio 64%, FEV1 2.6 L, performed at [facility] February 2019 pulmonary function testing: Ratio 67%, FEV1 2.77 L 108% predicted, FVC 4.11 L 114% predicted, FEF 2575 1.75 L 98% predicted total lung capacity 6.2 L 95% predicted, DLCO 19.5 mL 68% predicted September 2019 ratio 69%, FEV1 2.55L 105% predicted, forced vital capacity  3.7 L 107% predicted, total lung capacity 4.24 L 67% predicted, DLCO 11.9 mL 44% predicted  Sleep study 04/2018 Home sleep study AHI 19.9, O2 saturation < 90% for 516 min  Echo  01/2018 [facility] TTE>  Normal left ventricular systolic function, ejection fraction 60 to 01% Diastolic dysfunction - grade I (normal filling pressures)  Dilated left atrium - mild   Aortic sclerosis  Normal right ventricular systolic function  CBC    Component Value Date/Time   WBC 10.2 07/27/2018 1442   RBC 3.27 (L) 07/27/2018 1442   HGB 11.3 (L) 07/27/2018 1442   HGB 11.9 (L) 04/13/2018 1436   HGB 8.2 (L) 07/27/2017 1414   HCT 34.1 (L) 07/27/2018 1442   HCT 27.0 (L)  07/27/2017 1414   PLT 258.0 07/27/2018 1442   PLT 163 04/13/2018 1436   PLT 150 07/27/2017 1414   MCV 104.1 (H) 07/27/2018 1442   MCV 113.0 (H) 07/27/2017 1414   MCH 36.1 (H) 04/21/2018 1834   MCHC 33.2 07/27/2018 1442   RDW 14.3 07/27/2018 1442   RDW 22.3 (H) 07/27/2017 1414   LYMPHSABS 0.6 (L) 04/21/2018 1834   LYMPHSABS 0.4 (L) 07/27/2017 1414   MONOABS 0.7 04/21/2018 1834   MONOABS 0.5 07/27/2017 1414   EOSABS 0.0 04/21/2018 1834   EOSABS 0.0 07/27/2017 1414   BASOSABS 0.0 04/21/2018 1834   BASOSABS 0.0 07/27/2017 1414     .    Assessment & Plan:   Hypoxemia - Plan: CT Chest High Resolution, CBC w/Diff, ECHOCARDIOGRAM COMPLETE, D-Dimer, Quantitative, Basic Metabolic Panel (BMET), B Nat Peptide  Dyspnea, unspecified type - Plan: CT Chest High Resolution, CBC w/Diff, ECHOCARDIOGRAM COMPLETE, D-Dimer, Quantitative, Basic Metabolic Panel (BMET), B Nat Peptide  Restrictive lung disease - Plan: CT Chest High Resolution, CBC w/Diff, ECHOCARDIOGRAM COMPLETE, D-Dimer, Quantitative, Basic Metabolic Panel (BMET), B Nat Peptide  Acute respiratory failure with hypoxia (HCC)  OSA (obstructive sleep apnea)  COPD mixed type (HCC)  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 Seier.  Specifically his shortness of breath has dramatically worsened in a rapid pace over the last 6 weeks and his lung function testing is taken a significant decline.  Interestingly he does not seem to have worsening airflow obstruction but he has new restrictive lung disease with a worsening diffusion capacity.  This raises concern for a new interstitial lung disease process or alternatively I do wonder about the possibility of CHF as he has struggled with that in the past.  Whatever the process it has made him now hypoxemic which is very worrisome.  Plan: Chronic respiratory failure with hypoxemia: Use Oxygen with CPAP Use 3L O2 with exertion  Worsening shortness of breath in the  setting of allogenic bone marrow transplant and new restrictive lung disease: High-resolution CT stat Echocardiogram stat to look for evidence of pulmonary hypertension CBC now D-dimer now BNP now  Obstructive sleep apnea: Continue CPAP with oxygen We will follow-up the results of the CPAP titration study is required by her insurance company  I will continue to reach out to the following individual: Nurse navigator at [facility]: [place]  Follow up on Friday with an NP to go over these results. Alternatively if you can get an appointment in [place] on Friday I think it would be a good idea to be seen there.    > 50% of this 45 minute visit spent face to face   Current Outpatient Medications:  .  albuterol (PROAIR  HFA) 108 (90 Base) MCG/ACT inhaler, Inhale 2 puffs into the lungs every 6 (six) hours as needed for wheezing or shortness of breath., Disp: 1 Inhaler, Rfl: 3 .  calcium carbonate (TUMS - DOSED IN MG ELEMENTAL CALCIUM) 500 MG chewable tablet, Chew 0.5 tablets by mouth 2 (two) times daily. , Disp: , Rfl:  .  Carboxymethylcellulose Sodium (THERATEARS) 0.25 % SOLN, Apply 1 drop to eye 4 (four) times daily., Disp: , Rfl:  .  Cholecalciferol (VITAMIN D-1000 MAX ST) 1000 units tablet, Take 1,000 Units by mouth 2 (two) times daily. , Disp: , Rfl:  .  dapsone 100 MG tablet, Take 100 mg by mouth daily., Disp: , Rfl:  .  loratadine (CLARITIN) 10 MG tablet, Take 10 mg by mouth daily., Disp: , Rfl:  .  predniSONE (DELTASONE) 1 MG tablet, Take 3 mg by mouth daily with breakfast., Disp: , Rfl:  .  valACYclovir (VALTREX) 500 MG tablet, TAKE ONE TABLET BY MOUTH TWICE DAILY AT  NIGHT  TO  SUPPRESS  HERPES  GINGIVITIS, Disp: 60 tablet, Rfl: 1 .  famotidine (PEPCID) 20 MG tablet, Take 20 mg by mouth 2 (two) times daily. , Disp: , Rfl:  .  HYDROcodone-acetaminophen (NORCO) 5-325 MG tablet, Take 1-2 tablets by mouth every 6 (six) hours as needed for moderate pain or severe pain.  (Patient not taking: Reported on 08/01/2018), Disp: 30 tablet, Rfl: 0 .  umeclidinium-vilanterol (ANORO ELLIPTA) 62.5-25 MCG/INH AEPB, Inhale 1 puff into the lungs daily. (Patient not taking: Reported on 08/01/2018), Disp: 1 each, Rfl: 3 No current facility-administered medications for this visit.   Facility-Administered Medications Ordered in Other Visits:  .  sodium chloride flush (NS) 0.9 % injection 10 mL, 10 mL, Intravenous, PRN, Brunetta Genera, MD, 10 mL at 07/28/17 1645

## 2018-08-02 ENCOUNTER — Ambulatory Visit (INDEPENDENT_AMBULATORY_CARE_PROVIDER_SITE_OTHER)
Admission: RE | Admit: 2018-08-02 | Discharge: 2018-08-02 | Disposition: A | Discharge status: Home / Self Care | Payer: Medicare Other | Source: Ambulatory Visit | Attending: Pulmonary Disease | Admitting: Pulmonary Disease

## 2018-08-02 ENCOUNTER — Other Ambulatory Visit: Payer: Self-pay

## 2018-08-02 ENCOUNTER — Ambulatory Visit (HOSPITAL_COMMUNITY): Payer: Medicare Other | Attending: Cardiology

## 2018-08-02 ENCOUNTER — Telehealth: Payer: Self-pay

## 2018-08-02 DIAGNOSIS — I110 Hypertensive heart disease with heart failure: Secondary | ICD-10-CM | POA: Diagnosis not present

## 2018-08-02 DIAGNOSIS — R0902 Hypoxemia: Secondary | ICD-10-CM

## 2018-08-02 DIAGNOSIS — J984 Other disorders of lung: Secondary | ICD-10-CM

## 2018-08-02 DIAGNOSIS — J9601 Acute respiratory failure with hypoxia: Secondary | ICD-10-CM

## 2018-08-02 DIAGNOSIS — J449 Chronic obstructive pulmonary disease, unspecified: Secondary | ICD-10-CM | POA: Diagnosis not present

## 2018-08-02 DIAGNOSIS — D649 Anemia, unspecified: Secondary | ICD-10-CM | POA: Diagnosis not present

## 2018-08-02 DIAGNOSIS — R0600 Dyspnea, unspecified: Secondary | ICD-10-CM | POA: Diagnosis not present

## 2018-08-02 DIAGNOSIS — G4733 Obstructive sleep apnea (adult) (pediatric): Secondary | ICD-10-CM | POA: Insufficient documentation

## 2018-08-02 DIAGNOSIS — E785 Hyperlipidemia, unspecified: Secondary | ICD-10-CM | POA: Insufficient documentation

## 2018-08-02 DIAGNOSIS — R7881 Bacteremia: Secondary | ICD-10-CM | POA: Insufficient documentation

## 2018-08-02 DIAGNOSIS — I081 Rheumatic disorders of both mitral and tricuspid valves: Secondary | ICD-10-CM | POA: Insufficient documentation

## 2018-08-02 DIAGNOSIS — I2510 Atherosclerotic heart disease of native coronary artery without angina pectoris: Secondary | ICD-10-CM | POA: Diagnosis not present

## 2018-08-02 DIAGNOSIS — I509 Heart failure, unspecified: Secondary | ICD-10-CM | POA: Diagnosis not present

## 2018-08-02 DIAGNOSIS — G4734 Idiopathic sleep related nonobstructive alveolar hypoventilation: Secondary | ICD-10-CM

## 2018-08-02 NOTE — Telephone Encounter (Signed)
Spoke with Corene Cornea at [facility], requesting a new order placed with the O2 template stating "3l with exertion, 2l at night bleed into cpap".  New order has been placed.  Nothing further needed.

## 2018-08-03 ENCOUNTER — Telehealth: Payer: Self-pay | Admitting: Pulmonary Disease

## 2018-08-03 ENCOUNTER — Inpatient Hospital Stay: Admission: RE | Admit: 2018-08-03 | Payer: Medicare Other | Source: Ambulatory Visit

## 2018-08-03 ENCOUNTER — Ambulatory Visit: Payer: Medicare Other | Admitting: Pulmonary Disease

## 2018-08-03 DIAGNOSIS — R0689 Other abnormalities of breathing: Secondary | ICD-10-CM

## 2018-08-03 DIAGNOSIS — T380X5A Adverse effect of glucocorticoids and synthetic analogues, initial encounter: Secondary | ICD-10-CM

## 2018-08-03 DIAGNOSIS — R0600 Dyspnea, unspecified: Secondary | ICD-10-CM

## 2018-08-03 DIAGNOSIS — D89813 Graft-versus-host disease, unspecified: Secondary | ICD-10-CM

## 2018-08-03 DIAGNOSIS — J180 Bronchopneumonia, unspecified organism: Principal | ICD-10-CM

## 2018-08-03 DIAGNOSIS — D899 Disorder involving the immune mechanism, unspecified: Secondary | ICD-10-CM

## 2018-08-03 DIAGNOSIS — R0602 Shortness of breath: Principal | ICD-10-CM

## 2018-08-03 DIAGNOSIS — Z9484 Stem cells transplant status: Principal | ICD-10-CM

## 2018-08-03 DIAGNOSIS — A0471 Enterocolitis due to Clostridium difficile, recurrent: Secondary | ICD-10-CM

## 2018-08-03 DIAGNOSIS — C9200 Acute myeloblastic leukemia, not having achieved remission: Secondary | ICD-10-CM

## 2018-08-03 DIAGNOSIS — R739 Hyperglycemia, unspecified: Secondary | ICD-10-CM

## 2018-08-03 DIAGNOSIS — D89810 Acute graft-versus-host disease: Secondary | ICD-10-CM

## 2018-08-03 DIAGNOSIS — R7989 Other specified abnormal findings of blood chemistry: Secondary | ICD-10-CM

## 2018-08-03 MED ORDER — AZITHROMYCIN 500 MG TABLET
ORAL_TABLET | 0 refills | 0 days | Status: CP
Start: 2018-08-03 — End: ?

## 2018-08-03 MED ORDER — DEXAMETHASONE 0.5 MG/5 ML ORAL SOLUTION
3 refills | 0 days | Status: CP
Start: 2018-08-03 — End: ?

## 2018-08-03 NOTE — Addendum Note (Signed)
Addended by: Maryanna Shape A on: 08/03/2018 03:36 PM   Modules accepted: Orders

## 2018-08-03 NOTE — Telephone Encounter (Signed)
Spoke to pt, who wishes to proceed with VQ scan. Order has been placed.  Nothing further is needed.

## 2018-08-03 NOTE — Telephone Encounter (Signed)
08/03/18 0850  I was able to reach the patient via telephone to discuss his high-res images as well as his echocardiogram results.  Explained to patient that with elevated d-dimer results I wanted to go ahead and proceed forward with ordering a VQ scan as patient's creatinine is elevated and GFR is reduced so I do not think pt would be a good candidate for CT Angio.  I explained to him that I have discussed this with Dr. Lake Bells.  Patient reported that he was just about to contact our office to cancel his appointment as he was able to schedule an appointment with [facility] with Dr. Darol Destine at 1 PM today.  Patient reported that Dr. Darol Destine  explained to him that he may be admitted to the hospital for a bronchoscopy, also may need to be started on prednisone.  Patient recorded his d-dimer results as well as our suggestion for a VQ scan to discuss with Dr. Ok Edwards.  If patient is treated outpatient and sent home today he will contact our office so that we can then order a VQ scan.   Patient also wanted to ensure that he would be able to pick up his high-res CT images [employer].  I personally contacted [employer] and spoke with Marzetta Board who stated the images have been already burned and copied on the CT and they were just waiting to know that we have discussed the results with the patient.  I confirm that we have discussed the results with the patient the patient will be on his way today to pick up those images so that he can have them for his 1 PM appointment at [facility] with his transplant team. Erline Levine reported patient can pick up these images today.   I contacted patient back and let him know that he be able to pick up the CD.  Patient reports that he understands plan of care that he will discuss VQ scan with Dr. Ok Edwards.  He will contact our office if he is not admitted to the hospital today so that we can place the order for a VQ scan to be done outpatient.  Patient knows to contact our office with any questions  or concerns.  I have updated Dr. Lake Bells regarding the care of this patient.  Wyn Quaker FNP

## 2018-08-03 NOTE — Addendum Note (Signed)
Addended by: Amado Coe on: 08/03/2018 03:59 PM   Modules accepted: Orders

## 2018-08-03 NOTE — Telephone Encounter (Signed)
Patient received a call from NP B Mack at same time that he called into office. Everything has been addressed and taken care of. Instructed patient to call if he needed anything else. Nothing further needed at this time.

## 2018-08-05 ENCOUNTER — Ambulatory Visit: Admit: 2018-08-05 | Discharge: 2018-08-28 | Disposition: E | Payer: MEDICARE

## 2018-08-05 ENCOUNTER — Other Ambulatory Visit: Admit: 2018-08-05 | Discharge: 2018-08-28 | Disposition: E | Payer: MEDICARE

## 2018-08-05 ENCOUNTER — Ambulatory Visit: Admit: 2018-08-05 | Discharge: 2018-08-28 | Disposition: E | Payer: MEDICARE | Attending: Hematology & Oncology

## 2018-08-05 DIAGNOSIS — R0602 Shortness of breath: Principal | ICD-10-CM

## 2018-08-06 ENCOUNTER — Ambulatory Visit (HOSPITAL_COMMUNITY): Admission: RE | Admit: 2018-08-06 | Payer: Medicare Other | Source: Ambulatory Visit

## 2018-08-06 ENCOUNTER — Ambulatory Visit (HOSPITAL_COMMUNITY): Payer: Medicare Other

## 2018-08-06 DIAGNOSIS — R0602 Shortness of breath: Principal | ICD-10-CM

## 2018-08-07 DIAGNOSIS — R0602 Shortness of breath: Principal | ICD-10-CM

## 2018-08-08 DIAGNOSIS — R0602 Shortness of breath: Principal | ICD-10-CM

## 2018-08-09 MED ORDER — RUXOLITINIB 5 MG TABLET
ORAL_TABLET | Freq: Two times a day (BID) | ORAL | 0 refills | 0.00000 days
Start: 2018-08-09 — End: 2018-08-09

## 2018-08-09 NOTE — Unmapped (Addendum)
Steven Fernandez is a 77 y.o. 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 Now with increasing O2 requirements, max'd out of HFNC.      Acute hypoxic respiratory failure, likely GVHD:  Infectious work up has been negative thus far, however patient with worsening respiratory status.  - Continue flluticasone, azithro, montelukast (FAM)  - Continue PRN duonebs  - 9/11: intubated for bronch for d/t progressive cystic changes on CT chest 9/10 concerning for aucte on chronic GVHD (no evid of PE).  Extubated early am on 9/12-->. HFNC 50% 45L    - Heme-onc following: Pulmonary GVHD treatment:               - inc solumedrol 2mg /kg IV (125 mg) x 2 weeks, azitro Mon/wed/friday after CAP tx completed              - fluticasone INH 2very 12 hrs              - montelukast q hs              - cont atovaquone for PJP ppx              - Continue Jakafi 10 mg bid               - 9/19:  Ok with intubation if needed.                9/20:  planned for possible photoPheresis unable to take pt until this afternoon and ONLY if he can travel safely from oxygenation standpoint logistically-- we decided to hold off today.  Reassess on Monday. Irburtinib started.      ??  COPD: 40 pack year smoking history.   - Continue home anoro ellipta  - Duonebs PRN for wheezing  ??  OSA: Home CPAP QHS, uses 2L bleed in O2 at baseline just as of recent (last week) pulmonology appointment  - Home CPAP??tonight (does not tolerate hospital cpap device) once extubated    Fever in immunocompromised host, elevated EBV PCR: Inflammatory from GVHD vs. Infectious fever? Infectious work up negative thus far, with several pending studies. Started high dose IV steroids.  - Lower respiratory culture  - Viral PCRs: EBV, HSV, HHV6  - F/u BCx, UCx  - Antimicrobials:  ????????????????????????- Linezolid (9/8 - 9/11)  ????????????????????????- Cefepime (9/8 -9/11 )  ????????????????????????- Azithromycin (9/6 - )               - ceftriaxone (9/11-->)  - PPx: valtrex for ppx, - Voriconazole (9/9 -9/10) changed to posaconazole 9/10 for ppx  - ICID following: d/c linezolid/cefepime 9/11 and start CTX and azithro for CAP    - BAL cx: neg.  Fungal cx neg,  f/u additional studies    MDS-AML s/p BMT:??s/p MUD allogenic SCT (transplant 02/2017)??with RIC Bu/Flu??conditioning with post-transplant course??complicated with severe C diff and cutaneous GVHD. 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 Lance Bosch  - F/u with BMT team daily >> f/u  EBV- every Monday/thurs  - cont IV steroids for pulmonary GVHD  ??  HTN: likely 2/2 steroids  - hemodynamics improved after chest tube placed  - cont Coreg 25mg  bid  - added norvasc  9/19: lower BP yesterday and meds d/c'd. Improved BP again today.  If needed, will add back coreg.    9/20-9/21: BP  stable    Fever in immunocompromised host, elevated EBV PCR: Inflammatory from GVHD vs. Infectious fever? Infectious work up negative thus far, with several pending studies. Started high dose IV steroids.  - Lower respiratory culture  - Viral PCRs: EBV, HSV, HHV6  - F/u BCx, UCx >> ngtd x 2, resp sample inadeq  - Antimicrobials:  ????????????????????????- Linezolid (9/8 - 9/11)  ????????????????????????- Cefepime (9/8 -9/11 )  ????????????????????????- Azithromycin (9/6 - 9/16)               - ceftriaxone (9/11-->9/17)              - cefdinir (9/18-->)  - PPx: valtrex for ppx, - Voriconazole (9/9 -9/10) changed to posaconazole 9/10 for ppx, azithro m/w/f  - BAL cx: neg.  Fungal cx neg,  f/u additional studies      Methemoglobinemia 2/2 dapsone use:??11% at presentation, downtrending since discontinuation of dapsone. It is expected to lower O2 sat falsely due to altered binding of oxygen, will obtain ABGs for more accurate O2 levels.   - D/c dapsone  - Trend methemoglobin >> downtrend, poison control following,   - ABGs for O2 sat as described above  - Will hold on methylene blue d/t c/f inducing methemoglobinemia given high O2 requirements. Will trial high dose Vit C as alternative. --improved and can stop.    Dapsone changed to atovaquone for PJP ppx

## 2018-08-10 DIAGNOSIS — R0602 Shortness of breath: Principal | ICD-10-CM

## 2018-08-13 DIAGNOSIS — R0602 Shortness of breath: Principal | ICD-10-CM

## 2018-08-14 ENCOUNTER — Telehealth: Payer: Self-pay | Admitting: Pulmonary Disease

## 2018-08-14 DIAGNOSIS — R0602 Shortness of breath: Principal | ICD-10-CM

## 2018-08-14 NOTE — Telephone Encounter (Signed)
Called and spoke to pt. Pt stated he is currently admitted at [facility].  Pt is scheduled for cpap titration on 07/30/2018. Pt is requesting that sleep study be canceled, as he feels this is not needed for O2, as his health has declined.   pcc's can you guys cancel sleep study per pt request.  Routing to brian to make aware.

## 2018-08-14 NOTE — Telephone Encounter (Signed)
Thanks for letting me know.   Warren [name]

## 2018-08-14 NOTE — Telephone Encounter (Signed)
Pt is aware that titration has been canceled. Will close encounter, as nothing further is needed.

## 2018-08-14 NOTE — Telephone Encounter (Signed)
I have called the sleep lab and cancelled the cpap titration study

## 2018-08-15 ENCOUNTER — Ambulatory Visit: Payer: Medicare Other | Admitting: Pulmonary Disease

## 2018-08-15 ENCOUNTER — Encounter (HOSPITAL_BASED_OUTPATIENT_CLINIC_OR_DEPARTMENT_OTHER): Payer: Medicare Other

## 2018-08-15 DIAGNOSIS — R0602 Shortness of breath: Principal | ICD-10-CM

## 2018-08-15 MED ORDER — IBRUTINIB 420 MG TABLET
ORAL_TABLET | Freq: Every day | ORAL | 0 refills | 0.00000 days
Start: 2018-08-15 — End: 2018-08-15

## 2018-08-15 NOTE — Unmapped (Signed)
INTERVENTIONAL PULMONOLOGY FOLLOW-UP  CONSULT NOTE    Assessment:   Steven Fernandez is a 77 y.o. 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 Chest tube placed emergently at the bedside with significant improvement in appearance of the large L pneumothorax.      Plan:     1. Continue chest tube to suction at -20.  2. Daily CXR while chest tube is in place  3. Limit positive pressure ventilation as possible.  4. Please flush tube toward and away from patient with 20 cc sterile saline every shift.     This patient was seen and evaluated with Dr. Erick Colace, recommendations made in agreement.    Julieta Gutting, MD  Pulmonary & Critical Care Fellow  August 14, 2018  Pager [phone]      Subjective:   Dyspnea, hypoxemia and chest pain much improved. Difficulty speaking in full sentences. Chest tube has a large air leak on exhalation.    Past Medical History:   Diagnosis Date   ??? Acoustic neuroma (CMS-HCC) 1999   ??? Diffuse esophageal spasm     balloon dilation 10/2014   ??? Diverticulosis    ??? DJD (degenerative joint disease)    ??? Dry eyes    ??? Hypertension    ??? Melanoma in situ (CMS-HCC) 2010    shoulder   ??? Myelodysplastic syndrome, high grade (CMS-HCC) 02/2016   ??? Obstructive airway disease (CMS-HCC)     mild obstructive pattern on PFTs, no sx of COPD but prior smoker   ??? OSA on CPAP     rare CPAP use   ??? Prostate cancer (CMS-HCC) 2001    PSA undetectable since redical prostatectomy 2001   ??? Pruritus ani    ??? Trauma 2003    multiple trauma after falling from horse with hydropneumothorax, dislocated L shoulder, rotator cuff tear, elbow fracture, ulnar nerve injury       Past Surgical History:   Procedure Laterality Date   ??? BROW LIFT  2015   ??? CORNEA SURGERY Left 1960's    Something was removed from the cornea in pt's 20's.   ??? CRANIECTOMY FOR EXCISION OF ACOUSTIC NEUROMA  1999   ??? ELBOW FRACTURE SURGERY  2003   ??? OCULOPLASTIC SURGERY Bilateral 2014   ??? PILONIDAL CYST / SINUS EXCISION     ??? PR BRONCHOSCOPY,DIAGNOSTIC W LAVAGE Bilateral 08/08/2018    Procedure: BRONCHOSCOPY, RIGID OR FLEXIBLE, INCLUDE FLUOROSCOPIC GUIDANCE WHEN PERFORMED; W/BRONCHIAL ALVEOLAR LAVAGE WITH MODERATE SEDATION;  Surgeon: Baxter Kail, MD;  Location: [facility];  Service: Pulmonary   ??? PR BRONCHOSCOPY,TRANSBRONCH BIOPSY N/A 08/08/2018    Procedure: BRONCHOSCOPY, RIGID/FLEXIBLE, INCLUDE FLUORO GUIDANCE WHEN PERFORMED; W/TRANSBRONCHIAL LUNG BX, SINGLE LOBE WITH MODERATE SEDATION;  Surgeon: Baxter Kail, MD;  Location: [facility];  Service: Pulmonary   ??? PR COLONOSCOPY W/BIOPSY SINGLE/MULTIPLE N/A 09/11/2017    Procedure: COLONOSCOPY, FLEXIBLE, PROXIMAL TO SPLENIC FLEXURE; WITH BIOPSY, SINGLE OR MULTIPLE;  Surgeon: Carmon Ginsberg, MD;  Location: [facility];  Service: Gastroenterology   ??? PR COLSC FLX W/RMVL OF TUMOR POLYP LESION SNARE TQ N/A 09/11/2017    Procedure: COLONOSCOPY FLEX; W/REMOV TUMOR/LES BY SNARE;  Surgeon: Carmon Ginsberg, MD;  Location: [facility];  Service: Gastroenterology   ??? PR ESOPHAGEAL MOTILITY STUDY, MANOMETRY  N/A 06/02/2017    Procedure: ESOPHAGEAL MOTILITY STUDY W/INT & REP;  Surgeon: Nurse-Based Giproc;  Location: [facility];  Service: Gastroenterology   ??? PR GERD TST W/ NASAL IMPEDENCE ELECTROD N/A 06/02/2017    Procedure: ESOPH FUNCT TST NASL ELEC PLCMT;  Surgeon: Nurse-Based Giproc;  Location: [facility];  Service: Gastroenterology   ??? PR PREPARE FECAL MICROBIOTA FOR INSTILLATION  09/11/2017    Procedure: PREP FECAL MICROBIOTA FOR INSTILLATION, INCLUDING ASSESSMENT OF DONOR SPECIMEN;  Surgeon: Carmon Ginsberg, MD;  Location: [facility]; Service: Gastroenterology   ??? PR SIGMOIDOSCOPY,BIOPSY N/A 06/28/2017    Procedure: SIGMOIDOSCOPY, FLEXIBLE; WITH BIOPSY, SINGLE OR MULTIPLE;  Surgeon: Maris Berger, MD;  Location: [facility];  Service: Gastroenterology   ??? PR UPPER GI ENDOSCOPY,BIOPSY N/A 04/18/2017    Procedure: UGI ENDOSCOPY; WITH BIOPSY, SINGLE OR MULTIPLE;  Surgeon: Bronson Curb, MD;  Location: [facility];  Service: Gastroenterology   ??? PROSTATECTOMY  2001   ??? ROTATOR CUFF REPAIR Left 2004       Current Facility-Administered Medications   Medication Dose Route Frequency Provider Last Rate Last Dose   ??? acetaminophen (TYLENOL) tablet 650 mg  650 mg Oral Q4H PRN Bertram Gala, MD   650 mg at 08/06/18 0910   ??? albuterol (PROVENTIL HFA;VENTOLIN HFA) 90 mcg/actuation inhaler 2 puff  2 puff Inhalation Q6H PRN Bertram Gala, MD   2 puff at 08/06/18 2045   ??? amLODIPine (NORVASC) tablet 10 mg  10 mg Oral Daily Amy Ladon Applebaum, [place]   10 mg at 08/14/18 0837   ??? atovaquone (MEPRON) oral suspension  1,500 mg Oral Daily Amy Ladon Applebaum, PA   1,500 mg at 08/14/18 0825   ??? azithromycin (ZITHROMAX) tablet 250 mg  250 mg Oral Once per day on Mon Wed Fri Hyman Hopes, ACNP   250 mg at 08/13/18 1109   ??? carboxymethylcellulose sodium (THERATEARS) 0.25 % ophthalmic solution 2 drop  2 drop Both Eyes 4x Daily Bertram Gala, MD   2 drop at 08/14/18 2029   ??? carvedilol (COREG) tablet 25 mg  25 mg Oral BID Hyman Hopes, ACNP   25 mg at 08/14/18 2029   ??? [START ON 08/15/2018] cefdinir (OMNICEF) capsule 300 mg  300 mg Oral Q12H SCH Amy Ladon Applebaum, [place]       ??? cholecalciferol (vitamin D3) tablet 1,000 Units  1,000 Units Oral BID Bertram Gala, MD   1,000 Units at 08/14/18 2029   ??? dexamethasone (DECADRON) without alcohol concentrated oral solution  10 mg Oral Q6H SCH Amy Ross Vota, [place]   10 mg at 08/14/18 1712   ??? dextrose (D10W) 10% bolus 125 mL  12.5 g Intravenous Q30 Min PRN Bertram Gala, MD       ??? dibucaine (NUPERCAINAL) 1 % ointment   Topical Q2H PRN Amy Ladon Applebaum, PA       ??? famotidine (PEPCID) tablet 20 mg  20 mg Oral Daily Rayetta Pigg, MD   20 mg at 08/14/18 0825   ??? fluticasone propionate (FLOVENT HFA) 220 mcg/actuation inhaler 2 puff  2 puff Inhalation BID (RT) Amy Ladon Applebaum, PA   2 puff at 08/14/18 0902   ??? heparin (porcine) injection 5,000 Units  5,000 Units Subcutaneous [facility] Amy Ladon Applebaum, [place]   5,000 Units at 08/14/18 1400   ??? hydroxyzine (ATARAX) capsule/tablet 25 mg  25 mg Oral Q6H PRN  Bertram Gala, MD   25 mg at 08/14/18 2029   ??? influenza vaccine quad (FLUARIX, FLULAVAL, FLUZONE) (6 MOS & UP) 2019-20  0.5 mL Intramuscular During hospitalization Rayetta Pigg, MD       ??? insulin lispro (HumaLOG) injection 0-12 Units  0-12 Units Subcutaneous ACHS Bertram Gala, MD   2 Units at 08/14/18 1642   ??? loratadine (CLARITIN) tablet 10 mg  10 mg Oral Daily Bertram Gala, MD   10 mg at 08/14/18 0825   ??? melatonin tablet 3 mg  3 mg Oral Nightly PRN Bertram Gala, MD   3 mg at 08/14/18 2029   ??? methylPREDNISolone sodium succinate (PF) (Solu-MEDROL) injection 125 mg  125 mg Intravenous Daily Honey Lynnae January, ACNP   125 mg at 08/14/18 0829   ??? montelukast (SINGULAIR) tablet 10 mg  10 mg Oral Nightly Bertram Gala, MD   10 mg at 08/14/18 2029   ??? MORPhine 4 mg/mL injection 4 mg  4 mg Intravenous Q4H PRN Abigail Miyamoto, MD   2 mg at 08/13/18 1007   ??? ondansetron (ZOFRAN) injection 4 mg  4 mg Intravenous Q6H PRN Amy Ross Vota, PA       ??? phenol (CHLORASEPTIC) 1.4 % spray 2 spray  2 spray Mucous Membrane Q2H PRN Honey Monet Gildardo Cranker, ACNP       ??? polyethylene glycol (MIRALAX) packet 17 g  17 g Oral Daily PRN Bertram Gala, MD       ??? posaconazole (NOXAFIL) delayed released tablet 300 mg  300 mg Oral Daily Rayetta Pigg, MD   300 mg at 08/14/18 0825   ??? ruxolitinib (JAKAFI) tablet 10 mg  10 mg Oral BID Hyman Hopes, ACNP   10 mg at 08/14/18 2030   ??? senna (SENOKOT) tablet 2 tablet  2 tablet Oral Nightly PRN Bertram Gala, MD       ??? sodium chloride (NS) 0.9 % flush 10 mL  10 mL Intravenous Q8H Rayetta Pigg, MD   10 mL at 08/14/18 2033   ??? valACYclovir (VALTREX) tablet 500 mg  500 mg Oral Daily Bertram Gala, MD   500 mg at 08/14/18 0825       Allergies as of 08/05/2018 - Reviewed 08/05/2018   Allergen Reaction Noted   ??? Midazolam Other (See Comments) 05/04/2016   ??? Fluconazole Rash 10/11/2016       Family History   Problem Relation Age of Onset   ??? Thalassemia Brother         2 brothers, 1 sister, mother   ??? Prostate cancer Brother    ??? Melanoma Brother    ??? Prostate cancer Brother    ??? Leukemia Brother         CLL   ??? Schizophrenia Brother    ??? Kidney disease Brother    ??? Lung cancer Father         heavy smoker   ??? Hypertension Mother    ??? Lymphoma Sister         died after SCT   ??? Breast cancer Sister    ??? Prostate cancer Brother         2 brothers   ??? Hepatitis Brother         HCV   ??? Parkinsonism Brother    ??? Breast cancer Sister    ??? Glaucoma Neg Hx    ??? Diabetes Neg Hx  Social History     Socioeconomic History   ??? Marital status: Married     Spouse name: Not on file   ??? Number of children: Not on file   ??? Years of education: Not on file   ??? Highest education level: Not on file   Occupational History   ??? Not on file   Social Needs   ??? Financial resource strain: Not on file   ??? Food insecurity:     Worry: Not on file     Inability: Not on file   ??? Transportation needs:     Medical: Not on file     Non-medical: Not on file   Tobacco Use   ??? Smoking status: Former Smoker     Packs/day: 1.00     Years: 40.00     Pack years: 40.00     Types: Cigarettes     Last attempt to quit: 05/06/1996     Years since quitting: 22.2   ??? Smokeless tobacco: Never Used   Substance and Sexual Activity   ??? Alcohol use: Yes     Alcohol/week: 2.0 standard drinks     Types: 2 Standard drinks or equivalent per week     Frequency: 2-3 times a week   ??? Drug use: No   ??? Sexual activity: Not on file   Lifestyle   ??? Physical activity:     Days per week: Not on file     Minutes per session: Not on file   ??? Stress: Not on file   Relationships   ??? Social connections:     Talks on phone: Not on file     Gets together: Not on file     Attends religious service: Not on file     Active member of club or organization: Not on file     Attends meetings of clubs or organizations: Not on file     Relationship status: Not on file   Other Topics Concern   ??? Not on file   Social History Narrative    Retired attorney       Review of Systems  A 12 point review of systems was negative except for pertinent items noted in the HPI.    Objective:     Physical Examination:   BP 138/69  - Pulse 67  - Temp 36.5 ??C (Axillary)  - Resp 28  - Ht 170.2 cm (5' 7)  - Wt 62.1 kg (136 lb 14.5 oz)  - SpO2 98%  - BMI 21.44 kg/m??     General appearance - alert, acutely ill appearing  Eyes - Sclera anicteric, conjunctiva pink  Mouth - mucous membranes moist, pharynx normal without lesions  Neck - Trachea supple and midline, (-) JVD   Lymphatics - not examined  Heart - normal rate, regular rhythm, normal S1, S2, no murmurs, rubs, clicks or gallops  Chest - tachypneic,  and in mild respiratory distress, on nasal CPAP at 10cm H2O. Barely able to speak in full sentences. Bilat. Crackles; symmetric air entry. L chest tube with large airleak on exhalation.  Abdomen - soft, nontender, nondistended, no masses or organomegaly  not examined  Extremities - No pedal edema, no clubbing or cyanosis  Neurological - alert, oriented, normal speech, no focal findings or movement disorder noted    Labs and Imaging:    Personally reviewed and interpreted with Dr. Erick Colace.

## 2018-08-16 DIAGNOSIS — R0602 Shortness of breath: Principal | ICD-10-CM

## 2018-08-16 MED ORDER — IBRUTINIB 420 MG TABLET
ORAL_TABLET | Freq: Every day | ORAL | 0 refills | 0.00000 days | Status: CP
Start: 2018-08-16 — End: 2018-08-16

## 2018-08-16 MED ORDER — IBRUTINIB 420 MG TABLET: 420 mg | tablet | Freq: Every day | 0 refills | 0 days | Status: AC

## 2018-08-16 MED ORDER — IBRUTINIB 420 MG TABLET: 420 mg | tablet | Freq: Every day | 0 refills | 0 days

## 2018-08-16 MED ORDER — IBRUTINIB 140 MG CAPSULE
ORAL_CAPSULE | Freq: Every day | ORAL | 0 refills | 0.00000 days
Start: 2018-08-16 — End: 2018-08-16

## 2018-08-16 NOTE — Unmapped (Signed)
INTERVENTIONAL PULMONOLOGY FOLLOW-UP CONSULT NOTE    Assessment:     Assessment:   Steven Fernandez is a 77 y.o. 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 Pigtail chest tube placed 08/13/18 with decompression of tension pneumothorax.    Patient with small increase in size of pneumothorax today so increased chest tube suction to -40 cm H20.    Plan:     1. Continue chest tube to suction at -40 cm H2O.    2. Daily chest X-ray while chest tube is in place.    3. Please flush tube toward and away from patient with 20 cc sterile saline every shift    This patient was seen and evaluated with Dr. Erick Fernandez.    Please call the Interventional Pulmonary fellow at [phone] with any questions.    Steven Fernandez R. Steven Mirza, DO  Clinical Instructor  Interventional Pulmonary Fellow  Division of Pulmonary Diseases and Critical Care Medicine  Pager [phone]    Subjective:     S: Patient reports his breathing is better this afternoon. He had some shortness of breath in the morning but is feeling better.     Objective:     Physical Examination:     BP 137/85  - Pulse 65  - Temp 36.6 ??C (97.8 ??F) (Oral)  - Resp 21  - Ht 170.2 cm (5' 7)  - Wt 62.1 kg (136 lb 14.5 oz)  - SpO2 96%  - BMI 21.44 kg/m??    General appearance - oriented to person, place, and time  Heart - normal rate, regular rhythm, normal S1, S2, no murmurs, rubs, clicks or gallops  Chest - bibasilar crackles  Abdomen - soft, nontender, nondistended, no masses or organomegaly  Extremities - No pedal edema, no clubbing or cyanosis    Labs and Imaging:    Chest X-ray: increased size of pneumothorax

## 2018-08-16 NOTE — Unmapped (Signed)
INTERVENTIONAL PULMONOLOGY FOLLOW-UP CONSULT NOTE    Assessment:     Assessment:   Steven Fernandez is a 77 y.o. 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 Pigtail chest tube placed 08/13/18 with decompression of tension pneumothorax.    Patient with small increase in size of pneumothorax on 9/18 so increased chest tube suction to -40 cm H20.  CXR now w worsening L medial pneumothorax.    Plan:     1. Continue chest tube to suction at -40 cm H2O.    2. Daily chest X-ray while chest tube is in place.    3. Please flush tube toward and away from patient with 20 cc sterile saline every shift    4. Limit positive pressure ventilation- please try ventimask or nonrebreather    This patient was seen and evaluated with Dr. Erick Colace.    Please call the Interventional Pulmonary fellow at [phone] with any questions.    Julieta Gutting, MD  Pulmonary & Critical Care Fellow  August 16, 2018  Pager [phone]      Subjective:     S: Patient reports his breathing is stable; a little frustrated that he's been unable to wean off O2- now on HFNC 55L/80%. Slept well wo CPAP yesterday afternoon. Chest tube positional per primary team report. They report there was no air leak last night till about midnight, then a big air leak and now there is constant air leak.    Objective:     Physical Examination:     BP 175/77  - Pulse 75  - Temp 36.7 ??C (Oral)  - Resp 22  - Ht 170.2 cm (5' 7)  - Wt 62.1 kg (136 lb 14.5 oz)  - SpO2 92%  - BMI 21.44 kg/m??    General appearance - oriented to person, place, and time  Heart - normal rate, regular rhythm, normal S1, S2, no murmurs, rubs, clicks or gallops  Chest - bibasilar crackles. Chest tube output 143ml/24hrs  Abdomen - soft, nontender, nondistended, no masses or organomegaly  Extremities - No pedal edema, no clubbing or cyanosis    Labs and Imaging:    Chest X-ray:worsening L medial pneumothorax

## 2018-08-17 DIAGNOSIS — R0602 Shortness of breath: Principal | ICD-10-CM

## 2018-08-17 LAB — MAGNESIUM: Magnesium:MCnc:Pt:Ser/Plas:Qn:: 2.4 — ABNORMAL HIGH

## 2018-08-17 LAB — BLOOD GAS CRITICAL CARE PANEL, VENOUS
BASE EXCESS VENOUS: 3.1 — ABNORMAL HIGH (ref -2.0–2.0)
BASE EXCESS VENOUS: 2.3 — ABNORMAL HIGH (ref -2.0–2.0)
CALCIUM IONIZED VENOUS (MG/DL): 4.45 mg/dL (ref 4.40–5.40)
CALCIUM IONIZED VENOUS (MG/DL): 4.36 mg/dL — ABNORMAL LOW (ref 4.40–5.40)
GLUCOSE WHOLE BLOOD: 136 mg/dL
GLUCOSE WHOLE BLOOD: 188 mg/dL
HCO3 VENOUS: 28 mmol/L — ABNORMAL HIGH (ref 22–27)
HCO3 VENOUS: 27 mmol/L (ref 22–27)
HEMOGLOBIN BLOOD GAS: 10.3 g/dL — ABNORMAL LOW (ref 13.50–17.50)
LACTATE BLOOD VENOUS: 1.7 mmol/L (ref 0.5–1.8)
LACTATE BLOOD VENOUS: 2.3 mmol/L — ABNORMAL HIGH (ref 0.5–1.8)
O2 SATURATION VENOUS: 45.2 % (ref 40.0–85.0)
PCO2 VENOUS: 44 mmHg (ref 40–60)
PH VENOUS: 7.4 (ref 7.32–7.43)
PH VENOUS: 7.4 (ref 7.32–7.43)
PO2 VENOUS: 35 mmHg (ref 30–55)
PO2 VENOUS: 30 mmHg (ref 30–55)
POTASSIUM WHOLE BLOOD: 4.7 mmol/L — ABNORMAL HIGH (ref 3.4–4.6)
SODIUM WHOLE BLOOD: 139 mmol/L (ref 135–145)
SODIUM WHOLE BLOOD: 136 mmol/L (ref 135–145)

## 2018-08-17 LAB — PHOSPHORUS: Phosphate:MCnc:Pt:Ser/Plas:Qn:: 3.8

## 2018-08-17 LAB — COMPREHENSIVE METABOLIC PANEL
ALBUMIN: 2.8 g/dL — ABNORMAL LOW (ref 3.5–5.0)
ALT (SGPT): 176 U/L — ABNORMAL HIGH (ref 19–72)
ANION GAP: 3 mmol/L — ABNORMAL LOW (ref 9–15)
AST (SGOT): 84 U/L — ABNORMAL HIGH (ref 19–55)
BILIRUBIN TOTAL: 1.2 mg/dL (ref 0.0–1.2)
BLOOD UREA NITROGEN: 66 mg/dL — ABNORMAL HIGH (ref 7–21)
BUN / CREAT RATIO: 56
CALCIUM: 7.8 mg/dL — ABNORMAL LOW (ref 8.5–10.2)
CHLORIDE: 105 mmol/L (ref 98–107)
CO2: 26 mmol/L (ref 22.0–30.0)
CREATININE: 1.18 mg/dL (ref 0.70–1.30)
EGFR CKD-EPI AA MALE: 67 mL/min/{1.73_m2} (ref >=60)
EGFR CKD-EPI NON-AA MALE: 58 mL/min/{1.73_m2} — ABNORMAL LOW (ref >=60)
GLUCOSE RANDOM: 125 mg/dL (ref 65–179)
POTASSIUM: 5.2 mmol/L — ABNORMAL HIGH (ref 3.5–5.0)
PROTEIN TOTAL: 5.7 g/dL — ABNORMAL LOW (ref 6.5–8.3)
SODIUM: 134 mmol/L — ABNORMAL LOW (ref 135–145)

## 2018-08-17 LAB — ANION GAP: Anion gap 3:SCnc:Pt:Ser/Plas:Qn:: 3 — ABNORMAL LOW

## 2018-08-17 LAB — CBC
HEMATOCRIT: 33.9 % — ABNORMAL LOW (ref 41.0–53.0)
HEMOGLOBIN: 10.1 g/dL — ABNORMAL LOW (ref 13.5–17.5)
MEAN CORPUSCULAR HEMOGLOBIN CONC: 29.9 g/dL — ABNORMAL LOW (ref 31.0–37.0)
MEAN CORPUSCULAR HEMOGLOBIN: 32.5 pg (ref 26.0–34.0)
MEAN CORPUSCULAR VOLUME: 108.6 fL — ABNORMAL HIGH (ref 80.0–100.0)
PLATELET COUNT: 336 10*9/L (ref 150–440)
RED BLOOD CELL COUNT: 3.12 10*12/L — ABNORMAL LOW (ref 4.50–5.90)
RED CELL DISTRIBUTION WIDTH: 16.1 % — ABNORMAL HIGH (ref 12.0–15.0)
WBC ADJUSTED: 24.2 10*9/L — ABNORMAL HIGH (ref 4.5–11.0)

## 2018-08-17 LAB — PCO2 VENOUS: Lab: 45

## 2018-08-17 LAB — METHEMOGLOBIN: Methemoglobin/Hemoglobin.total:MFr:Pt:Bld:Qn:: 1.1

## 2018-08-17 LAB — BASE EXCESS VENOUS: Base excess:SCnc:Pt:BldV:Qn:Calculated: 2.3 — ABNORMAL HIGH

## 2018-08-17 LAB — MEAN CORPUSCULAR HEMOGLOBIN CONC: Lab: 29.9 — ABNORMAL LOW

## 2018-08-17 MED ORDER — IBRUTINIB 140 MG CAPSULE: 140 mg | capsule | Freq: Every day | 0 refills | 0 days | Status: AC

## 2018-08-17 MED ORDER — IBRUTINIB 140 MG TABLET
ORAL_TABLET | Freq: Every day | ORAL | 0 refills | 0 days | Status: CP
Start: 2018-08-17 — End: 2018-08-17
  Filled 2018-08-17: qty 28, 28d supply, fill #0

## 2018-08-17 MED ORDER — IBRUTINIB 140 MG CAPSULE
ORAL_CAPSULE | Freq: Every day | ORAL | 0 refills | 0.00000 days | Status: CP
Start: 2018-08-17 — End: 2018-08-17

## 2018-08-17 MED FILL — IMBRUVICA 140 MG CAPSULE: 28 days supply | Qty: 28 | Fill #0 | Status: AC

## 2018-08-17 NOTE — Unmapped (Addendum)
INTERVENTIONAL PULMONOLOGY FOLLOW-UP CONSULT NOTE    Assessment:     Assessment:   Steven Fernandez is a 77 y.o. 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 Pigtail chest tube placed 08/13/18 with decompression of tension pneumothorax.    Patient with small increase in size of pneumothorax on 9/18 so increased chest tube suction to -40 cm H20.CXR now w stable L medial pneumothorax.    Plan:     1. Continue chest tube to suction at -40 cm H2O.    2. Daily chest X-ray while chest tube is in place.    3. Please flush tube toward and away from patient with 20 cc sterile saline every shift    4. Considering patient's underlying lung disease and decreased lung compliance, use of positive pressure for hypoxia is unlikely to cause worsening of pneumothorax, especially with chest tube in place.    5. May travel outside the unit as long as chest tube is maintained on suction at -40cm H2O.    This patient was seen and evaluated with Dr. Erick Colace.    Please call the Interventional Pulmonary fellow at [phone] with any questions.    Julieta Gutting, MD  Pulmonary & Critical Care Fellow  August 17, 2018  Pager #:[phone]    Addendum 08/17/18 20:36:  Ernest Haber. Maple Mirza, DO  Clinical Instructor  Interventional Pulmonary Fellow  Division of Pulmonary Diseases and Critical Care Medicine  Pager [phone]        Subjective:     S: Patient reports his breathing is stable;off HFNC for almost 24hrs. Worsening oxygentation- SO2 mid-80s throughout the night on nrbm 100% and15L large bore [place]. Chest tube no longer positional. Plan for extracorporeal photopheresis today.    Objective:     Physical Examination:     BP 170/80  - Pulse 71  - Temp 36.5 ??C (Oral)  - Resp 18  - Ht 170.2 cm (5' 7)  - Wt 62.1 kg (136 lb 14.5 oz)  - SpO2 (!) 86%  - BMI 21.44 kg/m??    General appearance - oriented to person, place, and time  Heart - normal rate, regular rhythm, normal S1, S2, no murmurs, rubs, clicks or gallops  Chest - bibasilar crackles. Chest tube output 63ml/24hrs; big airleak on exhalation and with cough  Abdomen - soft, nontender, nondistended, no masses or organomegaly  Extremities - No pedal edema, no clubbing or cyanosis    Labs and Imaging:  Personally reviewed and interpreted w Dr. Erick Colace  Chest X-ray: Stable small left pneumothorax.

## 2018-08-17 NOTE — Unmapped (Signed)
B[facility] MARROW TRANSPLANT AND CELLULAR THERAPY CONSULT NOTE    Patient Name: Steven Fernandez  MRN: [id number]  Admit Date: 08/05/2018    BMT Attending MD: Dr. Lance Bosch    Disease: AML  Type of Transplant: MUD RIC Bu/Flu  Transplant Day: +514     HPI:  Steven Fernandez is a 77 y.o. male with a diagnosis of AML who is s/p MUD allogenic SCT (transplant 02/2017) who was admitted for progressive dyspnea over the preceding month but with acute worsening over the week prior to discharge. CT obtained about 1 week prior to admission was suggestive of possible interstitial pneumonitis. During this time he has been evaluated by Dr. 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     While outpatient evaluation plans were being developed, Steven Fernandez respiratory status deteriorated, and he was prompted to call when he noticed that his home pulse oxygen began to decrease with readings in the high 80s. He also noted that he experienced a fever which was recorded as 101.6. He was encouraged to present to his closest ED. On initial evaluation he described the above events at noted that his appetite has been reduced. He also complaied of a dry cough. He otherwise has no other symptoms report denying any chest pain, pleuritis, nausea, vomiting, diarrhea, dysuria or hematuria. He has not had any recent sick contacts.       Interval history:   Yesterday,   - Patient converted to [facility] + NRB from HFNC in light of enlarging pneumothorax to minimize positive pressure.  - Pharmacy team obtaining ibrutinib.  - Multiple conversations with apheresis team and MICU re: logistics of ECP  - Updated son, who will be here in person today.  - Repeat EBV PCR with falling copy number to 109.    Overnight, patient with sats in low 90s to upper 80s on above support.    Today, Steven Fernandez is discouraged by his low oxygen levels, though feels his breathing is subjectively unchanged from yesterday.  He denies other new complaints. Review of Systems:  A full system review was performed and was negative except as noted in the above interval history.    Temp:  [36 ??C (96.8 ??F)-37 ??C (98.6 ??F)] 36.5 ??C (97.7 ??F)  Heart Rate:  [63-86] 71  SpO2 Pulse:  [47-79] 66  Resp:  [18-35] 18  BP: (125-175)/(54-116) 170/80  MAP (mmHg):  [76-121] 108  FiO2 (%):  [80 %-100 %] 100 %  SpO2:  [66 %-100 %] 86 %    I/O last 3 completed shifts:  In: 1110 [P.O.:1030; I.V.:80]  Out: 2485 [Urine:2370; Chest Tube:115]  No intake/output data recorded.    Last 5 Recorded Weights    08/05/18 0550 08/05/18 1734   Weight: 65.8 kg (145 lb) 62.1 kg (136 lb 14.5 oz)     Weight change:     Test Results:   Reviewed in EPIC. Abnormal values discussed below.     Scheduled Meds:  ??? acetaminophen  650 mg Oral Q6H SCH   ??? atovaquone  1,500 mg Oral Daily   ??? azithromycin  250 mg Oral Once per day on Mon Wed Fri   ??? carboxymethylcellulose sodium  2 drop Both Eyes 4x Daily   ??? cefdinir  300 mg Oral Q12H SCH   ??? cholecalciferol (vitamin D3)  1,000 Units Oral BID   ??? dexamethasone without alcohol  10 mg Oral Q6H SCH   ??? famotidine  20 mg  Oral Daily   ??? fluticasone propionate  2 puff Inhalation BID (RT)   ??? heparin (porcine) for subcutaneous use  5,000 Units Subcutaneous [facility]   ??? flu vacc qs2019-20 6mos up(PF)  0.5 mL Intramuscular During hospitalization   ??? insulin lispro  0-12 Units Subcutaneous ACHS   ??? loratadine  10 mg Oral Daily   ??? methylPREDNISolone sodium succinate  125 mg Intravenous Daily   ??? montelukast  10 mg Oral Nightly   ??? posaconazole  300 mg Oral Daily   ??? ruxolitinib  10 mg Oral BID   ??? sodium chloride  10 mL Intravenous Q8H   ??? valACYclovir  500 mg Oral Daily     Continuous Infusions:  PRN Meds:.albuterol, dextrose, dibucaine, hydrOXYzine, melatonin, MORPhine injection, ondansetron, phenol, polyethylene glycol, senna    Physical Exam:   General: Sitting in a chair with LBNC / NRB in place  Neck: Interval placement of right sided pheresis catheter.  Cardiovascular: RRR without murmur appreciated, warm extremities.  Lungs: Crackles in bilateral lower lung fields, left > right.  Increased work of breathing, mild tachypnea.  He speaks in 2-4 word sentences.  Left sided anterior chest tube in place with c/d/i dressing and tidaling fluid in tube  Skin: Warm, dry, intact. No rash noted.   Psychiatry: Alert and oriented to person, place, and time.   Abdomen: + bowel sounds, No distension. No tenderness elicited.  Extremeties: Trace lower extremity edema noted.  Neurologic: CNII-XII grossly intact. Moves all extremities with at least antigravity strength    08/08/18 Transbronchial biopsy:  Final Diagnosis   A: Lung, right upper lobe, allograft, transbronchial biopsy  Two fragments of alveolated lung and two fragments of  Large airway wall. The lung shows mostly intra- alveolar fibroplasia (see note) and is negative for granuloma, tumor or any specific evidence of GVH.   The GMS stain is negative for pneumocystis or fungi however there is a detached fragment of tissue with a a few filamentous bacteria. I am not certain of the significance of these organisms but will do additional stains.  The airway wall shows increased goblet cells suggestive of chronic bronchitis   ??  Note: In the proper clinical and radiographic setting the histologic  features of intra-alveolar fibroplasia would be consistent with a diagnoisis of Bronchiolitis Obliterans Organizing Pneumonia (Organizing Bronchopneumonia or Cryptogenic Organizing Pneumonia) however it is generally unwise to diagnose BOOP on the basis of a transbronchial or needle core biopsy because of the difficulty in separating Primary BOOP (where BOOP is the only histologic process) from Secondary BOOP where BOOP is a component of some other specific histologic diagnosis)     08/07/18 CTA Chest:  No pulmonary embolism.    Findings are concerning for acute on chronic graft versus host disease given the progressive cystic changes since recent outside CT from 5 days ago, with the patchy peripheral consolidations representing organizing pneumonia versus less likely multifocal pneumonia. Bronchoscopy and lavage would be beneficial if patient can tolerate the procedure.      Assessment/Plan:   Steven Fernandez is a 77 y.o. male with a diagnosis of AML who is s/p MUD allogenic SCT (transplant 02/2017)  With RIC Bu/Flu condiitoning. 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 He is now admitted for dyspnea and hypoxia, found to have methemoglobinemia. The progressive cystic changes on chest CT noted on 9/10 increase our concern for pulmonary GVHD as the driver of his current hypoxic respiratory failure.  Other than this imaging, his evaluation thus far is notable for negative respiratory viral panel, negative CMV PCR obtained as outpatient on Friday, negative serum HSV / HHV6.  EBV PCR resulted in low-level positive result at 858 copies. Previously sent fungitell and aspergillus antigen assays negative.  Bronchoscopic infectious studies negative, notably including PJP DFA.  Pathology results from transbronchial biopsy are unfortunately non-specific, though the preponderance of evidence, including non-specific but compatible pathology, imaging results, and extensive negative infectious evaluation with bronchoscopy, support steroid-refractory acute GVHD superimposed on steroid-refractory chronic GVHD as driver of hypoxic respiratory failure.    We discussed our concerns that we may not be able to reverse his current pulmonary pathology.  We noted that he is at high risk of abrupt clinical decline, and we encouraged him to make decisions in advance regarding whether he would be accepting of intubation.  In this context, he asked [place] to reach to discuss this further with his son, who will be arriving in person later today.    Recommendations:   - Continue azithromycin per FAM treatment.  Otherwise agree with antibiotic recommendations per ID.  - Prophylactic Valtrex, atovaquone and posaconazole.  - Continue FAM for presumed pulmonary GVHD. Inhaled Fluticasone 440 mcg BID, Azithromycin 250 mg on Mondays / Wednesdays / Fridays after course of therapy complete for infectious coverage, montelukast 10 mg qhs.  - Continue solumedrol 2 mg / kg daily.   - Continue Jakafi at 10 mg BID for acute component of GVHD.  - Start ibrutinib 420 mg daily, which is expected to arrive in house today.  BMT will order this medication.   - We recommend initiation of ECP if felt to be safe; however, we do not expect a rapid benefit from this intervention, and we would not strongly push for this procedure to be performed if ICU team has substantial safety concerns about transporting patient to apheresis.   - Please check EBV PCRs twice weekly on Mondays and Thursdays  - For oral complaints, continue dexamethasone mouth wash for possible oral GVHD given pale mucosa and dry mouth.   - Filamentous gram positive bacteria on bronchial biopsy are of unclear significance. Would not change antibiotic plan at this time.      Patient was seen and examined with inpatient BMT attending, Dr. Oswaldo Done.  Recommendations were discussed with primary team in person.    Laurian Brim, MD  Hematology and Oncology Fellow

## 2018-08-17 NOTE — Unmapped (Signed)
Pt required NRB over [facility] for most of the night r/t patient sleeping with mouth wide open. Pt difficult to maintain SAT goal of 88 - team notified of concerns and stated to continue to monitor closely consideration pt is asymptomatic. Pigtail continues to have moderate airleak. One time complaint of back pain - relieved with PRNs. Plan for plasma exchange this AM at 0800 - ICU transport notified. No other updates at this time. Will continue to monitor.

## 2018-08-17 NOTE — Unmapped (Signed)
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 *      Interval history: Steven Fernandez is a 77 y.o. 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 Now with increasing O2 requirements, on HFNC    24hr events:   ptx slightly worse. Switched from HFNC to large bore 100% 15L.  Tolerated during evening  Trying to get iburutinib and consulted transfusion medicine for extracorpeal photopheresis.   Line placed.    Overnight desating into 70's.  On LBNC 100%, 15L and NRB this am.      Dispo: ICU    Neurological   #throat pain- improved 9/18  - could be GVHD.    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 Infection given leukocytosis and fever. Infectious work up has been negative thus far, however patient with worsening respiratory status. Decided to start IV steroids for treatment of BOOP. Possible component of methemoglobinemia on O2 sats, should obtain ABGs for most accurate information.   - cont wean HFNC as tol  - Continue flluticasone, azithro, montelukast (FAM)  - Continue PRN duonebs  - 9/11: intubated for bronch for d/t progressive cystic changes on CT chest 9/10 concerning for aucte on chronic GVHD (no evid of PE).  Extubated early am on 9/12.  Initially on HFNC 50% 45L, now increased to 80-100% FiO2  - Abx as below  - ICID following   - Heme-onc following   - inc solumedrol 2mg /kg IV (125 mg) x 2 weeks, azitro Mon/wed/friday after CAP tx  completed   - fluticasone INH 2very 12 hrs   - montelukast q hs   - cont atovaquone for PJP ppx   - Continue Jakafi 10 mg bid    - 9/19:  Awaiting approval for ibrutinib and consulted transfusion medicine for extracorporal photopheresis. Onc talking with pt's son today.  Re-discussing  GOC.   9/20:  Pheresis unable to take pt until this afternoon and ONLY if he can travel safely from oxygenation standpoint logistically.      COPD: 40 pack year smoking history.   - Continue home anoro ellipta  - Duonebs PRN for wheezing  ??  OSA: Home CPAP QHS, uses 2L bleed in O2 at baseline just as of recent (last week) pulmonology appointment  - Home CPAP on hold     L PTX: progressive tachypnea and wob, abg unchanged, cxr revealed large spont ptx  - IP placed emergent pigtail 9/16, tolerated well w versed and fent  - f/u CXR w successful evacuation of ptx, symptoms immed improved  9/18: - conts with airleak.  On -20 CXR this am with small apical PTX.  Will repeat CXR at 2pm  9/19: slightly worsening yesterday and placed on -40 cm.  Again slightly worse today.   Will  try off HFNC and only NRB to see if less flow stabilizes PTX.  Appreciate IP's assistance.    9/20: slightly better of HFNC.  But conts on -40 cm.      Cardiovascular   HTN: likely 2/2 steroids  - hemodynamics improved after chest tube placed  - cont Coreg 25mg  bid  - added norvasc  9/19: lower BP yesterday and meds d/c'd. Improved BP again today.  If needed, will add back coreg.    9/20: BP stable    Renal   CKD: Baseline 1.5-1.7, elevated to 1.9 on presentation, but now within baseline  - adeq uop, voiding   - goal euvolemia   9/19: cr stable.  Good uop.      Infectious Disease/Autoimmune   Fever in immunocompromised host, elevated EBV PCR: Inflammatory from GVHD vs. Infectious fever? Infectious work up negative thus far, with several pending studies. Started high dose IV steroids.  - Lower respiratory culture  - Viral PCRs: EBV, HSV, HHV6  - F/u BCx, UCx >> ngtd x 2, resp sample inadeq  - Antimicrobials:              - Linezolid (9/8 - 9/11)              - Cefepime (9/8 -9/11 )              - Azithromycin (9/6 - 9/16)    - ceftriaxone (9/11-->9/17)   - cefdinir (9/18-->)  - PPx: valtrex for ppx, - Voriconazole (9/9 -9/10) changed to posaconazole 9/10 for ppx, azithro m/w/f  - BAL cx: neg.  Fungal cx neg,  f/u additional studies    FEN/GI   Hx of SIBO: pt reports not taking rifaximin  ??  Hx of recurrent C Diff s/p Fecal Transplant: Stooling currently at baseline, will follow closely while giving high dose antibiotics.  ??  Hx of [place] disease: f/u daily LFTs    Malnutrition Assessment: Not done yet.   - reg diet     Heme/Coag   MDS-AML s/p BMT: s/p MUD allogenic SCT (transplant 02/2017)??with RIC Bu/Flu conditioning with post-transplant course complicated with severe C diff and cutaneous GVHD. There is now concern for pulmonary GVHD, although wondering if it is expected to occur with the more common forms of cutaneous/GI GVHD since other symptoms are not present.  - Primary transplant oncologist: Dr. Lance Bosch  - F/u with BMT team daily >> f/u weekly  Every mon/thursdays  - cont IV steroids for pulmonary GVHD: see pulm for GVHD plan  - CTA neg for PE  - SCDs  and heparin for DVT ppx      Methemoglobinemia 2/2 dapsone use: (Resolved) 11% at presentation, downtrending since discontinuation of dapsone. It is expected to lower O2 sat falsely due to altered binding of oxygen, will obtain ABGs for more accurate O2 levels.   - D/c'd dapsone  - Trend methemoglobin >> downtrend, poison control following,   - Will hold on methylene blue d/t c/f inducing methemoglobinemia given high O2 requirements. Will trial high dose Vit C as alternative. --improved and discontinued.    Dapsone changed to atovaquone for PJP ppx  ??  Endocrine   H/o steroid induced hyperglycemia: Now on IV steroids, will monitor for hyperglycemia  - POCT glucose  - SSI    Prophylaxis/LDA/Restraints/Consults   Can CVC be removed? No, cont PICC for difficult access and blood draws, dialysis catheter for apheresis  Can A-line be removed? N/A, no A-line present  Can Foley  be removed? N/A, no Foley present  Mobility plan: Step 5 - Bed to chair assessment    Feeding: Oral diet   Analgesia: No pain issues  Sedation SAT/SBT: N/A  Thrombembolic ppx: [place] hep  Head of bed >30 degrees: Yes  Ulcer ppx: Yes, home use continued  Glucose within target range: Yes, in range    RASS at goal? N/A, not on sedation  [place] Agitation Assessment Scale (RASS) : 0 (08/17/2018  4:00 AM)     Can antipsychotics be stopped? N/A, not on antipsychotics  CAM-ICU Result: Negative (08/16/2018  8:00 PM)      Patient Lines/Drains/Airways Status    Active Active Lines, Drains, & Airways     Name:   Placement date:   Placement time:   Site:   Days:    Apheresis Catheter 08/16/18   08/16/18    1600    Internal jugular   less than 1    Chest Drainage System 1 Left Pleural 14 Fr.   08/13/18    1030    Pleural   3    PICC Single Lumen 08/14/18 Right Basilic   08/14/18    1521    Basilic   2    Peripheral IV 16/10/96 Left Arm   08/06/18    1200    Arm   10              Patient Lines/Drains/Airways Status    Active Wounds     Name:   Placement date:   Placement time:   Site:   Days:    Wound 08/05/18 Skin Tear Arm Left small skin tear near elbow   08/05/18    1800    Arm   11                Goals of Care     Code Status: Full Code    Designated Healthcare Decision Maker:  Mr. Billy currently has decisional capacity for healthcare decision-making and is able to designate a surrogate healthcare decision maker. Mr. Oriley designated healthcare decision maker(s) is/are Makyi Ledo (the patient's spouse) as denoted by stated patient preference.      Subjective     Feels ok trying to stay positive.  Throat pain conts to improve    Objective Vitals - past 24 hours  Temp:  [36 ??C (96.8 ??F)-37 ??C (98.6 ??F)] 36.5 ??C (97.7 ??F)  Heart Rate:  [63-86] 71  SpO2 Pulse:  [47-79] 66  Resp:  [18-35] 18  BP: (125-175)/(54-116) 170/80  FiO2 (%):  [80 %-100 %] 100 %  SpO2:  [66 %-100 %] 86 % Intake/Output  I/O last 3 completed shifts:  In: 1110 [P.O.:1030; I.V.:80]  Out: 2485 [Urine:2370; Chest Tube:115]     Physical Exam:    General: Well appearing male,   HEENT: Atraumatic, normocephalic, EOMI, MMM  CV: RRR, no murmurs  Pulm: coarse breath sounds bilaterally AP,  L pigtail- C/d/I at site.  With air leak  GI: soft, nontender, nondistended, +BS  MSK: moves all four extremities spontaneously, no edema  Skin: no rash or jaundice, no cutaneous manifestations of GVHD expect small area on L arm  Neuro: AAOx3, very pleasant, non focal motor strength      Continuous Infusions:       Scheduled Medications:   ??? acetaminophen  650 mg Oral Q6H SCH   ??? atovaquone  1,500 mg Oral Daily   ??? azithromycin  250 mg Oral Once per day on Mon  Wed Fri   ??? carboxymethylcellulose sodium  2 drop Both Eyes 4x Daily   ??? cefdinir  300 mg Oral Q12H SCH   ??? cholecalciferol (vitamin D3)  1,000 Units Oral BID   ??? dexamethasone without alcohol  10 mg Oral Q6H SCH   ??? famotidine  20 mg Oral Daily   ??? fluticasone propionate  2 puff Inhalation BID (RT)   ??? heparin (porcine) for subcutaneous use  5,000 Units Subcutaneous [facility]   ??? flu vacc qs2019-20 6mos up(PF)  0.5 mL Intramuscular During hospitalization   ??? insulin lispro  0-12 Units Subcutaneous ACHS   ??? loratadine  10 mg Oral Daily   ??? methylPREDNISolone sodium succinate  125 mg Intravenous Daily   ??? montelukast  10 mg Oral Nightly   ??? posaconazole  300 mg Oral Daily   ??? ruxolitinib  10 mg Oral BID   ??? sodium chloride  10 mL Intravenous Q8H   ??? valACYclovir  500 mg Oral Daily       PRN medications:  albuterol, dextrose, dibucaine, hydrOXYzine, melatonin, MORPhine injection, ondansetron, phenol, polyethylene glycol, senna    Data/Imaging Review: Reviewed in Epic and personally interpreted on 08/17/2018. See EMR for detailed results.      Critical Care Attestation     This patient is critically ill or injured with the impairment of vital organ systems such that there is a high probability of imminent or life threatening deterioration in the patient's condition. This patient must remain in the ICU for ongoing evaluation of the comprehensive management plan outlined in this note. I directly provided critical care services as documented in this note and the critical care time spent (30 min) is exclusive of separately billable procedures.    Tyeasha Ebbs Paulino Door, PA

## 2018-08-17 NOTE — Unmapped (Signed)
Patient is admitted in [facility].   Specialty Pharmacist will follow up with patient next week to onboard him to receive Imbruvica from the [facility]).  Christiane Ha Ptachcinski, CPP is picking the medication up at the [facility] to bring to the patient's room and counsel him on the medication.    Corliss Skains. [place], [place].[facility]  [phone] option [phone]

## 2018-08-18 DIAGNOSIS — R0602 Shortness of breath: Principal | ICD-10-CM

## 2018-08-18 LAB — COMPREHENSIVE METABOLIC PANEL
ALBUMIN: 2.7 g/dL — ABNORMAL LOW (ref 3.5–5.0)
ALKALINE PHOSPHATASE: 169 U/L — ABNORMAL HIGH (ref 38–126)
ALT (SGPT): 251 U/L — ABNORMAL HIGH (ref 19–72)
ANION GAP: 2 mmol/L — ABNORMAL LOW (ref 9–15)
AST (SGOT): 102 U/L — ABNORMAL HIGH (ref 19–55)
BILIRUBIN TOTAL: 1 mg/dL (ref 0.0–1.2)
BLOOD UREA NITROGEN: 74 mg/dL — ABNORMAL HIGH (ref 7–21)
BUN / CREAT RATIO: 59
CALCIUM: 7.6 mg/dL — ABNORMAL LOW (ref 8.5–10.2)
CHLORIDE: 103 mmol/L (ref 98–107)
CO2: 30 mmol/L (ref 22.0–30.0)
CREATININE: 1.26 mg/dL (ref 0.70–1.30)
EGFR CKD-EPI AA MALE: 62 mL/min/{1.73_m2} (ref >=60)
EGFR CKD-EPI NON-AA MALE: 54 mL/min/{1.73_m2} — ABNORMAL LOW (ref >=60)
GLUCOSE RANDOM: 133 mg/dL (ref 65–179)
PROTEIN TOTAL: 5.2 g/dL — ABNORMAL LOW (ref 6.5–8.3)

## 2018-08-18 LAB — BLOOD GAS CRITICAL CARE PANEL, VENOUS
BASE EXCESS VENOUS: 3.7 — ABNORMAL HIGH (ref -2.0–2.0)
BASE EXCESS VENOUS: 3.2 — ABNORMAL HIGH (ref -2.0–2.0)
CALCIUM IONIZED VENOUS (MG/DL): 4.69 mg/dL (ref 4.40–5.40)
GLUCOSE WHOLE BLOOD: 140 mg/dL
GLUCOSE WHOLE BLOOD: 130 mg/dL
HCO3 VENOUS: 28 mmol/L — ABNORMAL HIGH (ref 22–27)
HEMOGLOBIN BLOOD GAS: 9.4 g/dL — ABNORMAL LOW (ref 13.50–17.50)
HEMOGLOBIN BLOOD GAS: 9 g/dL — ABNORMAL LOW (ref 13.50–17.50)
LACTATE BLOOD VENOUS: 1.6 mmol/L (ref 0.5–1.8)
LACTATE BLOOD VENOUS: 1.9 mmol/L — ABNORMAL HIGH (ref 0.5–1.8)
O2 SATURATION VENOUS: 43.4 % (ref 40.0–85.0)
PCO2 VENOUS: 41 mmHg (ref 40–60)
PCO2 VENOUS: 38 mmHg — ABNORMAL LOW (ref 40–60)
PH VENOUS: 7.44 — ABNORMAL HIGH (ref 7.32–7.43)
PH VENOUS: 7.46 — ABNORMAL HIGH (ref 7.32–7.43)
PO2 VENOUS: 29 mmHg — ABNORMAL LOW (ref 30–55)
POTASSIUM WHOLE BLOOD: 4.9 mmol/L — ABNORMAL HIGH (ref 3.4–4.6)
POTASSIUM WHOLE BLOOD: 4.9 mmol/L — ABNORMAL HIGH (ref 3.4–4.6)
SODIUM WHOLE BLOOD: 139 mmol/L (ref 135–145)
SODIUM WHOLE BLOOD: 140 mmol/L (ref 135–145)

## 2018-08-18 LAB — CBC
HEMATOCRIT: 30.6 % — ABNORMAL LOW (ref 41.0–53.0)
HEMOGLOBIN: 9.5 g/dL — ABNORMAL LOW (ref 13.5–17.5)
MEAN CORPUSCULAR HEMOGLOBIN CONC: 31.1 g/dL (ref 31.0–37.0)
MEAN CORPUSCULAR HEMOGLOBIN: 33.3 pg (ref 26.0–34.0)
MEAN CORPUSCULAR VOLUME: 107 fL — ABNORMAL HIGH (ref 80.0–100.0)
MEAN PLATELET VOLUME: 9.2 fL (ref 7.0–10.0)
PLATELET COUNT: 310 10*9/L (ref 150–440)
RED BLOOD CELL COUNT: 2.86 10*12/L — ABNORMAL LOW (ref 4.50–5.90)
WBC ADJUSTED: 19.9 10*9/L — ABNORMAL HIGH (ref 4.5–11.0)

## 2018-08-18 LAB — AST (SGOT): Aspartate aminotransferase:CCnc:Pt:Ser/Plas:Qn:: 102 — ABNORMAL HIGH

## 2018-08-18 LAB — POTASSIUM WHOLE BLOOD: Potassium:SCnc:Pt:Bld:Qn:: 4.9 — ABNORMAL HIGH

## 2018-08-18 LAB — MAGNESIUM: Magnesium:MCnc:Pt:Ser/Plas:Qn:: 2.4 — ABNORMAL HIGH

## 2018-08-18 LAB — SODIUM WHOLE BLOOD: Sodium:SCnc:Pt:Bld:Qn:: 139

## 2018-08-18 LAB — PHOSPHORUS: Phosphate:MCnc:Pt:Ser/Plas:Qn:: 3.2

## 2018-08-18 LAB — MEAN CORPUSCULAR HEMOGLOBIN: Lab: 33.3

## 2018-08-18 NOTE — Unmapped (Signed)
MICU Daily Progress Note     Date of Service: 08/18/2018    Problem List:   Principal Problem:    Graft-versus-host disease, acute (CMS-HCC)  Active Problems:    Recurrent Clostridium difficile diarrhea    History of allogeneic stem cell transplant (CMS-HCC)    Acute respiratory failure with hypoxia (CMS-HCC)    Fever    Methemoglobinemia    Chronic kidney disease    Pneumothorax on left    Chronic graft-versus-host disease (CMS-HCC)  Resolved Problems:    * No resolved hospital problems. *      Interval history: Steven Fernandez is a 77 y.o. 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 Now with increasing O2 requirements, on HFNC    24hr events:   Did not get  photopheresis due to 0xygen requirement over previous night and early day.   Started Iburutinib 9/20  No events overnight.  Tol Large bore [place] 100% 15L  CXR stable    Dispo: ICU    Neurological   #throat pain- improved 9/18  - could be GVHD.    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 Infection given leukocytosis and fever. Infectious work up has been negative thus far, however patient with worsening respiratory status. Decided to start IV steroids for treatment of BOOP. Possible component of methemoglobinemia on O2 sats, should obtain ABGs for most accurate information.   - cont wean HFNC as tol  - Continue flluticasone, azithro, montelukast (FAM)  - Continue PRN duonebs  - 9/11: intubated for bronch for d/t progressive cystic changes on CT chest 9/10 concerning for aucte on chronic GVHD (no evid of PE).  Extubated early am on 9/12.  Initially on HFNC 50% 45L, now increased to 80-100% FiO2  - Abx as below  - ICID following   - Heme-onc following   - inc solumedrol 2mg /kg IV (125 mg) x 2 weeks, azitro Mon/wed/friday after CAP tx  completed   - fluticasone INH 2very 12 hrs   - montelukast q hs   - cont atovaquone for PJP ppx   - Continue Jakafi 10 mg bid    - 9/19:  Awaiting approval for ibrutinib and consulted transfusion medicine for extracorporal photopheresis. Onc talking with pt's son today.  Re-discussing  GOC.:  Ok with intubation if needed.     9/20:  Pheresis unable to take pt until this afternoon and ONLY if he can travel safely from oxygenation standpoint logistically-- we decided to hold off today.  Reassess on Monday Irburtinib started.      COPD: 40 pack year smoking history.   - Continue home anoro ellipta  - Duonebs PRN for wheezing  ??  OSA: Home CPAP QHS, uses 2L bleed in O2 at baseline just as of recent (last week) pulmonology appointment  - Home CPAP on hold     L PTX: progressive tachypnea and wob, abg unchanged, cxr revealed large spont ptx  - IP placed emergent pigtail 9/16, tolerated well w versed and fent  - f/u CXR w successful evacuation of ptx, symptoms immed improved  9/18: - conts with airleak.  On -20 CXR this am with small apical PTX.  Will repeat CXR at 2pm  9/19: slightly worsening yesterday and  placed on -40 cm.  Again slightly worse today.   Will try off HFNC and only NRB to see if less flow stabilizes PTX.  Appreciate IP's assistance.    9/20: slightly better off HFNC.  But conts on -40 cm.    9/21: stable, cont to -40      Cardiovascular   HTN: likely 2/2 steroids  - hemodynamics improved after chest tube placed  - cont Coreg 25mg  bid  - added norvasc  9/19: lower BP yesterday and meds d/c'd. Improved BP again today.  If needed, will add back coreg.    9/20-9/21: BP stable    Renal   CKD: Baseline 1.5-1.7, elevated to 1.9 on presentation, but now within baseline  - adeq uop, voiding   - goal euvolemia   9/19-9/21: cr stable.  Good uop.      Infectious Disease/Autoimmune   Fever in immunocompromised host, elevated EBV PCR: Inflammatory from GVHD vs. Infectious fever? Infectious work up negative thus far, with several pending studies. Started high dose IV steroids.  - Lower respiratory culture  - Viral PCRs: EBV, HSV, HHV6  - F/u BCx, UCx >> ngtd x 2, resp sample inadeq  - Antimicrobials:              - Linezolid (9/8 - 9/11)              - Cefepime (9/8 -9/11 )              - Azithromycin (9/6 - 9/16)    - ceftriaxone (9/11-->9/17)   - cefdinir (9/18-->)  - PPx: valtrex for ppx, - Voriconazole (9/9 -9/10) changed to posaconazole 9/10 for ppx, azithro m/w/f  - BAL cx: neg.  Fungal cx neg,  f/u additional studies    FEN/GI   Hx of SIBO: pt reports not taking rifaximin  ??  Hx of recurrent C Diff s/p Fecal Transplant: Stooling currently at baseline, will follow closely while giving high dose antibiotics.  ??  Hx of [place] disease: f/u daily LFTs    Malnutrition Assessment: Not done yet.   - reg diet     Heme/Coag   MDS-AML s/p BMT: s/p MUD allogenic SCT (transplant 02/2017)??with RIC Bu/Flu conditioning with post-transplant course complicated with severe C diff and cutaneous GVHD. There is now concern for pulmonary GVHD, although wondering if it is expected to occur with the more common forms of cutaneous/GI GVHD since other symptoms are not present.  - Primary transplant oncologist: Dr. Lance Bosch  - F/u with BMT team daily >> f/u weekly  Every mon/thursdays  - cont IV steroids for pulmonary GVHD: see pulm for GVHD plan  - CTA neg for PE  - SCDs  and heparin for DVT ppx      Methemoglobinemia 2/2 dapsone use: (Resolved) 11% at presentation, downtrending since discontinuation of dapsone. It is expected to lower O2 sat falsely due to altered binding of oxygen, will obtain ABGs for more accurate O2 levels.   - D/c'd dapsone  - Trend methemoglobin >> downtrend, poison control following,   - Will hold on methylene blue d/t c/f inducing methemoglobinemia given high O2 requirements. Will trial high dose Vit C as alternative. --improved and discontinued.    Dapsone changed to atovaquone for PJP ppx  ??  Endocrine   H/o steroid induced hyperglycemia: Now on IV steroids, will monitor for hyperglycemia  - POCT glucose  - SSI    Prophylaxis/LDA/Restraints/Consults   Can CVC be removed? No, cont PICC for difficult  access and blood draws, dialysis catheter for apheresis  Can A-line be removed? N/A, no A-line present  Can Foley be removed? N/A, no Foley present  Mobility plan: Step 5 - Bed to chair assessment    Feeding: Oral diet   Analgesia: No pain issues  Sedation SAT/SBT: N/A  Thrombembolic ppx:  hep  Head of bed >30 degrees: Yes  Ulcer ppx: Yes, home use continued  Glucose within target range: Yes, in range    RASS at goal? N/A, not on sedation  [place] Agitation Assessment Scale (RASS) : 0 (08/18/2018  6:00 AM)     Can antipsychotics be stopped? N/A, not on antipsychotics  CAM-ICU Result: Negative (08/17/2018  8:00 PM)      Patient Lines/Drains/Airways Status    Active Active Lines, Drains, & Airways     Name:   Placement date:   Placement time:   Site:   Days:    Apheresis Catheter 08/16/18   08/16/18    1600    Internal jugular   1    Chest Drainage System 1 Left Pleural 14 Fr.   08/13/18    1030    Pleural   4    PICC Single Lumen 08/14/18 Right Basilic   08/14/18    1521    Basilic   3    Peripheral IV 61/60/73 Left Arm   08/06/18    1200    Arm   11              Patient Lines/Drains/Airways Status    Active Wounds     Name:   Placement date:   Placement time:   Site:   Days:    Wound 08/05/18 Skin Tear Arm Left small skin tear near elbow   08/05/18    1800    Arm   12                Goals of Care     Code Status: Full Code    Designated Healthcare Decision Maker:  Mr. Falls currently has decisional capacity for healthcare decision-making and is able to designate a surrogate healthcare decision maker. Mr. Taborda designated healthcare decision maker(s) is/are Dontel Harshberger (the patient's spouse) as denoted by stated patient preference.      Subjective Slept well.  Feels better and not requiring 100% NRB in addition to his large bore [place] as much.      Objective     Vitals - past 24 hours  Temp:  [36.6 ??C (97.9 ??F)-36.7 ??C (98.1 ??F)] 36.6 ??C (97.9 ??F)  Heart Rate:  [60-83] 75  SpO2 Pulse:  [60-83] 83  Resp:  [14-31] 22  BP: (113-182)/(54-97) 162/76  FiO2 (%):  [100 %] 100 %  SpO2:  [77 %-98 %] 92 % Intake/Output  I/O last 3 completed shifts:  In: 1220 [P.O.:1200; I.V.:20]  Out: 2495 [Urine:2395; Chest Tube:100]     Physical Exam:    General: Well appearing male,   HEENT: Atraumatic, normocephalic, EOMI, MMM  CV: RRR, no murmurs  Pulm: coarse breath sounds bilaterally AP,  L pigtail- C/d/I at site.  With air leak  GI: soft, nontender, nondistended, +BS  MSK: moves all four extremities spontaneously, no edema  Skin: no rash or jaundice, no cutaneous manifestations of GVHD expect small area on L arm  Neuro: AAOx3, very pleasant, non focal motor strength      Continuous Infusions:   ??? IP okay to treat     ??? sodium chloride  Scheduled Medications:   ??? atovaquone  1,500 mg Oral Daily   ??? azithromycin  250 mg Oral Once per day on Mon Wed Fri   ??? carboxymethylcellulose sodium  2 drop Both Eyes 4x Daily   ??? cefdinir  300 mg Oral Q12H SCH   ??? cholecalciferol (vitamin D3)  1,000 Units Oral BID   ??? dexamethasone without alcohol  10 mg Oral Q6H SCH   ??? famotidine  20 mg Oral Daily   ??? fluticasone propionate  2 puff Inhalation BID (RT)   ??? heparin (porcine) for subcutaneous use  5,000 Units Subcutaneous [facility]   ??? ibrutinib  140 mg Oral Daily   ??? flu vacc qs2019-20 6mos up(PF)  0.5 mL Intramuscular During hospitalization   ??? insulin lispro  0-12 Units Subcutaneous ACHS   ??? loratadine  10 mg Oral Daily   ??? methylPREDNISolone sodium succinate  125 mg Intravenous Daily   ??? montelukast  10 mg Oral Nightly   ??? posaconazole  300 mg Oral Daily   ??? ruxolitinib  10 mg Oral BID   ??? sodium chloride  10 mL Intravenous Q8H   ??? valACYclovir  500 mg Oral Daily       PRN medications: acetaminophen, albuterol, dextrose, dibucaine, diphenhydrAMINE, EPINEPHrine IM, famotidine (PEPCID) IV, hydrOXYzine, IP okay to treat, melatonin, meperidine, methylPREDNISolone sodium succinate (PF), MORPhine injection, ondansetron, phenol, polyethylene glycol, senna, sodium chloride, sodium chloride 0.9%    Data/Imaging Review: Reviewed in Epic and personally interpreted on 08/18/2018. See EMR for detailed results.      Critical Care Attestation     This patient is critically ill or injured with the impairment of vital organ systems such that there is a high probability of imminent or life threatening deterioration in the patient's condition. This patient must remain in the ICU for ongoing evaluation of the comprehensive management plan outlined in this note. I directly provided critical care services as documented in this note and the critical care time spent (30 min) is exclusive of separately billable procedures.    Aldahir Litaker Paulino Door, PA

## 2018-08-18 NOTE — Unmapped (Signed)
BONE MARROW TRANSPLANT AND CELLULAR THERAPY CONSULT NOTE    Patient Name: Steven Fernandez  MRN: [id number]  Admit Date: 08/05/2018    BMT Attending MD: Dr. Lance Bosch    Disease: AML  Type of Transplant: MUD RIC Bu/Flu  Transplant Day: +515    HPI:  Steven Fernandez is a 77 y.o. male with a diagnosis of AML who is s/p MUD allogenic SCT (transplant 02/2017) who was admitted for progressive dyspnea over the preceding month but with acute worsening over the week prior to discharge. CT obtained about 1 week prior to admission was suggestive of possible interstitial pneumonitis. During this time he has been evaluated by Dr. 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     While outpatient evaluation plans were being developed, Steven Fernandez respiratory status deteriorated, and he was prompted to call when he noticed that his home pulse oxygen began to decrease with readings in the high 80s. He also noted that he experienced a fever which was recorded as 101.6. He was encouraged to present to his closest ED. On initial evaluation he described the above events at noted that his appetite has been reduced. He also complaied of a dry cough. He otherwise has no other symptoms report denying any chest pain, pleuritis, nausea, vomiting, diarrhea, dysuria or hematuria. He has not had any recent sick contacts.       Interval history:   -feeling better today- has been on 15L satting in high 80s-90s/ Only used NRB for one hour last night.  Given worsening PTX, goal was to minimize positive pressure ventilation which he has been tolerating.  -pharmacy team obtained ibrutinib through heroic efforts  -Son at the bedside  -they are both generally hopeful given his significantly improvement over the last 1-2 days.  -asking about ECP and at what oxygen level he could safely receive it.    Today, Steven Fernandez is encouraged by his clinical improvement.    Review of Systems:  A full system review was performed and was negative except as noted in the above interval history.    Temp:  [36.4 ??C (97.6 ??F)-36.7 ??C (98.1 ??F)] 36.4 ??C (97.6 ??F)  Heart Rate:  [60-88] 83  SpO2 Pulse:  [60-84] 79  Resp:  [15-31] 28  BP: (115-182)/(54-129) 147/80  MAP (mmHg):  [75-132] 99  FiO2 (%):  [100 %] 100 %  SpO2:  [77 %-97 %] 94 %    I/O last 3 completed shifts:  In: 1220 [P.O.:1200; I.V.:20]  Out: 2495 [Urine:2395; Chest Tube:100]  I/O this shift:  In: 520 [P.O.:500; I.V.:20]  Out: -     Last 5 Recorded Weights    08/05/18 0550 08/05/18 1734   Weight: 65.8 kg (145 lb) 62.1 kg (136 lb 14.5 oz)     Weight change:     Test Results:   Reviewed in EPIC. Abnormal values discussed below.     Scheduled Meds:  ??? atovaquone  1,500 mg Oral Daily   ??? azithromycin  250 mg Oral Once per day on Mon Wed Fri   ??? carboxymethylcellulose sodium  2 drop Both Eyes 4x Daily   ??? cefdinir  300 mg Oral Q12H SCH   ??? cholecalciferol (vitamin D3)  1,000 Units Oral BID   ??? dexamethasone without alcohol  10 mg Oral Q6H SCH   ??? famotidine  20 mg Oral Daily   ??? fluticasone propionate  2 puff Inhalation BID (RT)   ??? heparin (porcine)  for subcutaneous use  5,000 Units Subcutaneous [facility]   ??? ibrutinib  140 mg Oral Daily   ??? flu vacc qs2019-20 6mos up(PF)  0.5 mL Intramuscular During hospitalization   ??? insulin lispro  0-12 Units Subcutaneous ACHS   ??? loratadine  10 mg Oral Daily   ??? methylPREDNISolone sodium succinate  125 mg Intravenous Daily   ??? montelukast  10 mg Oral Nightly   ??? posaconazole  300 mg Oral Daily   ??? ruxolitinib  10 mg Oral BID   ??? sodium chloride  10 mL Intravenous Q8H   ??? valACYclovir  500 mg Oral Daily     Continuous Infusions:  ??? IP okay to treat     ??? sodium chloride       PRN Meds:.acetaminophen, albuterol, dextrose, dibucaine, diphenhydrAMINE, EPINEPHrine IM, famotidine (PEPCID) IV, hydrOXYzine, IP okay to treat, melatonin, meperidine, methylPREDNISolone sodium succinate (PF), MORPhine injection, ondansetron, phenol, polyethylene glycol, senna, sodium chloride, sodium chloride 0.9%    Physical Exam:   General: Sitting in a chair with LBNC on 15L  Neck: Right sided pheresis catheter- no erythema or drainage  Cardiovascular: RRR without murmur appreciated, warm extremities.  Lungs: Crackles on left, decreased breath sounds on right. Speaking in full sentences without desaturation. Occasionally seems mildly short of breath.  Left sided anterior chest tube in place with c/d/i dressing, fluid no longer moving in tube.  Skin: Warm, dry, intact. No rash noted.   Psychiatry: Alert and oriented to person, place, and time.   Abdomen: + bowel sounds, No distension. No tenderness elicited.  Extremeties: Trace lower extremity edema noted.  Neurologic: CNII-XII grossly intact. Moves all extremities with at least antigravity strength    08/08/18 Transbronchial biopsy:  Final Diagnosis   A: Lung, right upper lobe, allograft, transbronchial biopsy  Two fragments of alveolated lung and two fragments of  Large airway wall. The lung shows mostly intra- alveolar fibroplasia (see note) and is negative for granuloma, tumor or any specific evidence of GVH.   The GMS stain is negative for pneumocystis or fungi however there is a detached fragment of tissue with a a few filamentous bacteria. I am not certain of the significance of these organisms but will do additional stains.  The airway wall shows increased goblet cells suggestive of chronic bronchitis   ??  Note: In the proper clinical and radiographic setting the histologic  features of intra-alveolar fibroplasia would be consistent with a diagnoisis of Bronchiolitis Obliterans Organizing Pneumonia (Organizing Bronchopneumonia or Cryptogenic Organizing Pneumonia) however it is generally unwise to diagnose BOOP on the basis of a transbronchial or needle core biopsy because of the difficulty in separating Primary BOOP (where BOOP is the only histologic process) from Secondary BOOP where BOOP is a component of some other specific histologic diagnosis)     08/07/18 CTA Chest:  No pulmonary embolism.    Findings are concerning for acute on chronic graft versus host disease given the progressive cystic changes since recent outside CT from 5 days ago, with the patchy peripheral consolidations representing organizing pneumonia versus less likely multifocal pneumonia. Bronchoscopy and lavage would be beneficial if patient can tolerate the procedure.      Assessment/Plan:   Steven Fernandez is a 77 y.o. male with a diagnosis of AML who is s/p MUD allogenic SCT (transplant 02/2017)  With RIC Bu/Flu condiitoning. 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 He is now admitted for dyspnea and hypoxia, found to have methemoglobinemia.  The progressive cystic changes on chest CT noted on 9/10 increase our concern for pulmonary GVHD as the driver of his current hypoxic respiratory failure.      Other than this imaging, his evaluation thus far is notable for negative respiratory viral panel, negative CMV PCR obtained as outpatient on Friday, negative serum HSV / HHV6.  EBV PCR resulted in low-level positive result at 858 copies. Previously sent fungitell and aspergillus antigen assays negative.  Bronchoscopic infectious studies negative, notably including PJP DFA.  Pathology results from transbronchial biopsy are unfortunately non-specific, though the preponderance of evidence, including non-specific but compatible pathology, imaging results, and extensive negative infectious evaluation with bronchoscopy, support steroid-refractory acute GVHD superimposed on steroid-refractory chronic GVHD as driver of hypoxic respiratory failure.    Our team previously discussed concerns that we may not be able to reverse his current pulmonary pathology, noting that he is at high risk of abrupt clinical decline, and he was encouraged to make decisions in advance regarding whether he would be accepting of intubation. Today, he has had a notable improvement in clinical symptoms and oxygen requirement.  We will continue to assess daily for trend.    Recommendations:   - Continue azithromycin per FAM treatment.  Otherwise agree with antibiotic recommendations per ID.  - Prophylactic Valtrex, atovaquone and posaconazole.  - Continue FAM for presumed pulmonary GVHD. Inhaled Fluticasone 440 mcg BID, Azithromycin 250 mg on Mondays / Wednesdays / Fridays after course of therapy complete for infectious coverage, montelukast 10 mg qhs.  - Continue solumedrol 2 mg / kg daily.   - Continue Jakafi at 10 mg BID for acute component of GVHD.  - Continue ibrutinib 140 mg daily (9/20-)  - We recommend initiation of ECP if felt to be safe; however, we do not expect a rapid benefit from this intervention, and we would not strongly push for this procedure to be performed if ICU team has substantial safety concerns about transporting patient to apheresis.   - Please check EBV PCRs twice weekly on Mondays and Thursdays  - For oral complaints, continue dexamethasone mouth wash for possible oral GVHD given pale mucosa and dry mouth.    - Filamentous gram positive bacteria on bronchial biopsy are of unclear significance. Would not change antibiotic plan at this time.       Patient was seen and examined with inpatient BMT attending, Dr. Oswaldo Done.  Recommendations were discussed with primary team in person.    Nicolasa Ducking, MD  PGY4, Hematology-Oncology fellow  p [phone]

## 2018-08-18 NOTE — Unmapped (Signed)
Pt a/oX4. NSR, sys <180. Afebrile. Notified Mdi concerns of spo2 in low 80s/high 70s while sleeping d/t OSA- monitored pt closely since asymptomatic and pt states he does not want to wear non-rebreather unless spo2 <75%. As pt continued to desat to 75% more frequently during deep sleep RN discussed risks with pt and at approx 0300 pt agreed to wear non-rebreather during sleep. Spo2 now maintains >92%. Voids in urinal. No BM. Continuing to monitor.       Problem: Adult Inpatient Plan of Care  Goal: Plan of Care Review  Outcome: Ongoing - Unchanged  Goal: Patient-Specific Goal (Individualization)  Outcome: Ongoing - Unchanged  Goal: Absence of Hospital-Acquired Illness or Injury  Outcome: Ongoing - Unchanged  Goal: Optimal Comfort and Wellbeing  Outcome: Ongoing - Unchanged  Goal: Readiness for Transition of Care  Outcome: Ongoing - Unchanged  Goal: Rounds/Family Conference  Outcome: Ongoing - Unchanged     Problem: Gas Exchange Impaired  Goal: Optimal Gas Exchange  Outcome: Ongoing - Unchanged     Problem: Fall Injury Risk  Goal: Absence of Fall and Fall-Related Injury  Outcome: Ongoing - Unchanged     Problem: Venous Thromboembolism  Goal: VTE (Venous Thromboembolism) Symptom Resolution  Outcome: Ongoing - Unchanged     Problem: Breathing Pattern Ineffective  Goal: Effective Breathing Pattern  Outcome: Ongoing - Unchanged     Problem: Obstructive Sleep Apnea Risk or Actual (Comorbidity Management)  Goal: Unobstructed Breathing During Sleep  Outcome: Ongoing - Unchanged     Problem: Skin Injury Risk Increased  Goal: Skin Health and Integrity  Outcome: Ongoing - Unchanged     Problem: Self-Care Deficit  Goal: Improved Ability to Complete Activities of Daily Living  Outcome: Ongoing - Unchanged     Problem: Anxiety  Goal: Anxiety Reduction or Resolution  Outcome: Ongoing - Unchanged     Problem: Noninvasive Ventilation Acute  Goal: Effective Unassisted Ventilation and Oxygenation  Outcome: Ongoing - Unchanged

## 2018-08-18 NOTE — Unmapped (Signed)
Patient remained on 15L large bore [place]. Had a couple episodes of desaturation this shift. He received his inhaler without complications. Will continue to monitor.

## 2018-08-19 DIAGNOSIS — R0602 Shortness of breath: Principal | ICD-10-CM

## 2018-08-19 LAB — COMPREHENSIVE METABOLIC PANEL
ALBUMIN: 2.5 g/dL — ABNORMAL LOW (ref 3.5–5.0)
ALKALINE PHOSPHATASE: 143 U/L — ABNORMAL HIGH (ref 38–126)
ALT (SGPT): 205 U/L — ABNORMAL HIGH (ref 19–72)
ANION GAP: 4 mmol/L — ABNORMAL LOW (ref 9–15)
AST (SGOT): 66 U/L — ABNORMAL HIGH (ref 19–55)
BILIRUBIN TOTAL: 1.1 mg/dL (ref 0.0–1.2)
BLOOD UREA NITROGEN: 78 mg/dL — ABNORMAL HIGH (ref 7–21)
BUN / CREAT RATIO: 58
CALCIUM: 7.7 mg/dL — ABNORMAL LOW (ref 8.5–10.2)
CHLORIDE: 106 mmol/L (ref 98–107)
CO2: 27 mmol/L (ref 22.0–30.0)
CREATININE: 1.34 mg/dL — ABNORMAL HIGH (ref 0.70–1.30)
EGFR CKD-EPI NON-AA MALE: 50 mL/min/{1.73_m2} — ABNORMAL LOW (ref >=60)
GLUCOSE RANDOM: 136 mg/dL (ref 65–179)
SODIUM: 137 mmol/L (ref 135–145)

## 2018-08-19 LAB — CBC
HEMATOCRIT: 28.1 % — ABNORMAL LOW (ref 41.0–53.0)
MEAN CORPUSCULAR HEMOGLOBIN CONC: 30.7 g/dL — ABNORMAL LOW (ref 31.0–37.0)
MEAN CORPUSCULAR HEMOGLOBIN: 32.7 pg (ref 26.0–34.0)
MEAN CORPUSCULAR VOLUME: 106.7 fL — ABNORMAL HIGH (ref 80.0–100.0)
MEAN PLATELET VOLUME: 9.7 fL (ref 7.0–10.0)
PLATELET COUNT: 300 10*9/L (ref 150–440)
RED BLOOD CELL COUNT: 2.63 10*12/L — ABNORMAL LOW (ref 4.50–5.90)
RED CELL DISTRIBUTION WIDTH: 16.9 % — ABNORMAL HIGH (ref 12.0–15.0)
WBC ADJUSTED: 19 10*9/L — ABNORMAL HIGH (ref 4.5–11.0)

## 2018-08-19 LAB — BLOOD GAS CRITICAL CARE PANEL, VENOUS
BASE EXCESS VENOUS: 3.1 — ABNORMAL HIGH (ref -2.0–2.0)
CALCIUM IONIZED VENOUS (MG/DL): 4.49 mg/dL (ref 4.40–5.40)
GLUCOSE WHOLE BLOOD: 141 mg/dL
HCO3 VENOUS: 27 mmol/L (ref 22–27)
HEMOGLOBIN BLOOD GAS: 9 g/dL — ABNORMAL LOW (ref 13.50–17.50)
O2 SATURATION VENOUS: 31.1 % — ABNORMAL LOW (ref 40.0–85.0)
PO2 VENOUS: 23 mmHg — ABNORMAL LOW (ref 30–55)
POTASSIUM WHOLE BLOOD: 5 mmol/L — ABNORMAL HIGH (ref 3.4–4.6)
SODIUM WHOLE BLOOD: 139 mmol/L (ref 135–145)

## 2018-08-19 LAB — ANION GAP: Anion gap 3:SCnc:Pt:Ser/Plas:Qn:: 4 — ABNORMAL LOW

## 2018-08-19 LAB — MAGNESIUM: Magnesium:MCnc:Pt:Ser/Plas:Qn:: 2.5 — ABNORMAL HIGH

## 2018-08-19 LAB — GLUCOSE WHOLE BLOOD: Glucose:MCnc:Pt:Bld:Qn:: 141

## 2018-08-19 LAB — PHOSPHORUS: Phosphate:MCnc:Pt:Ser/Plas:Qn:: 3.2

## 2018-08-19 LAB — MEAN PLATELET VOLUME: Lab: 9.7

## 2018-08-19 NOTE — Unmapped (Signed)
MICU Daily Progress Note     Date of Service: 08/19/2018    Problem List:   Principal Problem:    Graft-versus-host disease, acute (CMS-HCC)  Active Problems:    Recurrent Clostridium difficile diarrhea    History of allogeneic stem cell transplant (CMS-HCC)    Acute respiratory failure with hypoxia (CMS-HCC)    Fever    Methemoglobinemia    Chronic kidney disease    Pneumothorax on left    Chronic graft-versus-host disease (CMS-HCC)  Resolved Problems:    * No resolved hospital problems. *      Interval history: Steven Fernandez is a 77 y.o. 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 Now with increasing O2 requirements, on HFNC    24hr events:   Inc size ptx while  On HFNC, dec size on LBNC, cont -40cm sxn, activity as tol, DOE easily     Dispo: ICU    Neurological   #throat pain- improved 9/18  - could be GVHD.    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 Infection given leukocytosis and fever. Infectious work up has been negative thus far, however patient with worsening respiratory status. Decided to start IV steroids for treatment of BOOP. Possible component of methemoglobinemia on O2 sats, should obtain ABGs for most accurate information.   - cont wean HFNC as tol  - Continue flluticasone, azithro, montelukast (FAM)  - Continue PRN duonebs  - 9/11: intubated for bronch for d/t progressive cystic changes on CT chest 9/10 concerning for aucte on chronic GVHD (no evid of PE).  Extubated early am on 9/12.  Initially on HFNC 50% 45L, now increased to 80-100% FiO2  - Abx as below  - ICID following   - Heme-onc following   - inc solumedrol 2mg /kg IV (125 mg) x 2 weeks, azitro Mon/wed/friday after CAP tx >>completed   - fluticasone INH 2very 12 hrs   - montelukast q hs   - cont atovaquone for PJP ppx   - Continue Jakafi 10 mg bid    - 9/19:  Awaiting approval for ibrutinib and consulted transfusion medicine for extracorporal photopheresis. Onc talking with pt's son today.  Re-discussing  GOC.:  Ok with intubation if needed.     9/20:  Pheresis unable to take pt until this afternoon and ONLY if he can travel safely from oxygenation standpoint logistically-- we decided to hold off today.  Reassess on Monday Irburtinib started.      COPD: 40 pack year smoking history.   - Continue home anoro ellipta  - Duonebs PRN for wheezing  ??  OSA: Home CPAP QHS, uses 2L bleed in O2 at baseline just as of recent (last week) pulmonology appointment  - Home CPAP on hold     L PTX: progressive tachypnea and wob, abg unchanged, cxr revealed large spont ptx  - IP placed emergent pigtail 9/16, tolerated well w versed and fent  - f/u CXR w successful evacuation of ptx, symptoms immed improved  9/18: - conts with airleak.  On -20 CXR this am with small apical PTX.  Will repeat CXR at 2pm  9/19: slightly worsening yesterday and placed on -40 cm.  Again slightly worse today.   Will try off HFNC  and only NRB to see if less flow stabilizes PTX.  Appreciate IP's assistance.    9/20: slightly better off HFNC.  But conts on -40 cm.    9/21: stable, cont to -40  9/22: Inc size ptx while  On HFNC, dec size on [facility]     Cardiovascular   HTN: likely 2/2 steroids  - hemodynamics improved after chest tube placed  - cont Coreg 25mg  bid  - added norvasc  9/19: lower BP yesterday and meds d/c'd. Improved BP again today.  If needed, will add back coreg.    9/20-9/21: BP stable    Renal   CKD: Baseline 1.5-1.7, elevated to 1.9 on presentation, but now within baseline  - adeq uop, voiding   - goal euvolemia   9/19-9/21: cr stable.  Good uop.      Infectious Disease/Autoimmune   Fever in immunocompromised host, elevated EBV PCR: Inflammatory from GVHD vs. Infectious fever? Infectious work up negative thus far, with several pending studies. Started high dose IV steroids.  - Lower respiratory culture  - Viral PCRs: EBV, HSV, HHV6  - F/u BCx, UCx >> ngtd x 2, resp sample inadeq  - Antimicrobials:              - Linezolid (9/8 - 9/11)              - Cefepime (9/8 -9/11 )              - Azithromycin (9/6 - 9/16)    - ceftriaxone (9/11-->9/17)   - cefdinir (9/18-->)  - PPx: valtrex for ppx, - Voriconazole (9/9 -9/10) changed to posaconazole 9/10 for ppx, azithro m/w/f  - BAL cx: neg.  Fungal cx neg,  f/u additional studies    FEN/GI   Hx of SIBO: pt reports not taking rifaximin  ??  Hx of recurrent C Diff s/p Fecal Transplant: Stooling currently at baseline, will follow closely while giving high dose antibiotics.  ??  Hx of [place] disease: f/u daily LFTs    Malnutrition Assessment: Not done yet.   - reg diet, poor appetite  - ordered      Heme/Coag   MDS-AML s/p BMT: s/p MUD allogenic SCT (transplant 02/2017)??with RIC Bu/Flu conditioning with post-transplant course complicated with severe C diff and cutaneous GVHD. There is now concern for pulmonary GVHD, although wondering if it is expected to occur with the more common forms of cutaneous/GI GVHD since other symptoms are not present.  - Primary transplant oncologist: Dr. Lance Bosch  - F/u with BMT team daily >> f/u weekly  Every mon/thursdays  - cont IV steroids for pulmonary GVHD: see pulm for GVHD plan  - CTA neg for PE  - SCDs  and heparin for DVT ppx      Methemoglobinemia 2/2 dapsone use: (Resolved) 11% at presentation, downtrending since discontinuation of dapsone. It is expected to lower O2 sat falsely due to altered binding of oxygen, will obtain ABGs for more accurate O2 levels.   - D/c'd dapsone  - Trend methemoglobin >> downtrend, poison control following,   - Will hold on methylene blue d/t c/f inducing methemoglobinemia given high O2 requirements. Will trial high dose Vit C as alternative. --improved and discontinued.    Dapsone changed to atovaquone for PJP ppx  ??  Endocrine   H/o steroid induced hyperglycemia: Now on IV steroids, will monitor for hyperglycemia  - POCT glucose  - SSI    Prophylaxis/LDA/Restraints/Consults   Can CVC be removed? No, cont  PICC for difficult access and blood draws, dialysis catheter for apheresis  Can A-line be removed? N/A, no A-line present  Can Foley be removed? N/A, no Foley present  Mobility plan: Step 5 - Bed to chair assessment    Feeding: Oral diet   Analgesia: No pain issues  Sedation SAT/SBT: N/A  Thrombembolic ppx: [place] hep  Head of bed >30 degrees: Yes  Ulcer ppx: Yes, home use continued  Glucose within target range: Yes, in range    RASS at goal? N/A, not on sedation  [place] Agitation Assessment Scale (RASS) : 0 (08/19/2018  8:00 AM)     Can antipsychotics be stopped? N/A, not on antipsychotics  CAM-ICU Result: Negative (08/19/2018  8:00 AM)      Patient Lines/Drains/Airways Status    Active Active Lines, Drains, & Airways     Name:   Placement date:   Placement time:   Site:   Days:    Apheresis Catheter 08/16/18   08/16/18    1600    Internal jugular   2    Chest Drainage System 1 Left Pleural 14 Fr.   08/13/18    1030    Pleural   6    PICC Single Lumen 08/14/18 Right Basilic   08/14/18    1521    Basilic   4    Peripheral IV 16/10/96 Left Arm   08/06/18    1200    Arm   12              Patient Lines/Drains/Airways Status    Active Wounds     Name:   Placement date:   Placement time:   Site:   Days:    Wound 08/05/18 Skin Tear Arm Left small skin tear near elbow   08/05/18    1800    Arm   13                Goals of Care     Code Status: Full Code    Designated Healthcare Decision Maker:  Steven Fernandez currently has decisional capacity for healthcare decision-making and is able to designate a surrogate healthcare decision maker. Steven Fernandez designated healthcare decision maker(s) is/are Steven Fernandez (the patient's spouse) as denoted by stated patient preference.      Subjective Slept well.  Feels better and not requiring 100% NRB in addition to his large bore [place] as much.      Objective     Vitals - past 24 hours  Temp:  [36.3 ??C (97.3 ??F)-36.7 ??C (98.1 ??F)] 36.3 ??C (97.3 ??F)  Heart Rate:  [69-89] 89  SpO2 Pulse:  [47-86] 75  Resp:  [18-39] 22  BP: (121-158)/(48-86) 121/81  SpO2:  [82 %-100 %] 90 % Intake/Output  I/O last 3 completed shifts:  In: 1900 [P.O.:1850; I.V.:50]  Out: 2380 [Urine:2375; Chest Tube:5]     Physical Exam:    General: Well appearing male,   HEENT: Atraumatic, normocephalic, EOMI, MMM  CV: RRR, no murmurs  Pulm: coarse breath sounds bilaterally AP,  L pigtail- C/d/I at site.  With air leak  GI: soft, nontender, nondistended, +BS  MSK: moves all four extremities spontaneously, no edema  Skin: no rash or jaundice, no cutaneous manifestations of GVHD expect small area on L arm    Neuro exam:   Mental status: GCS 15, A&Ox4, calm approp  Speech/Language: names/repeats, intact  Cranial nerves: Perrl 3Br, VFF, EOMi, gaze mdl, no diploplia,  V1-3 intact, face symm, uvula  mdl/upgoing, tong mdl, shoulders full/equal   Motor: BUE 5/5, BLE 5/5, no drift, normal bulk and tone   Sensory: intact       Continuous Infusions:   ??? IP okay to treat     ??? sodium chloride         Scheduled Medications:   ??? atovaquone  1,500 mg Oral Daily   ??? azithromycin  250 mg Oral Once per day on Mon Wed Fri   ??? carboxymethylcellulose sodium  2 drop Both Eyes 4x Daily   ??? cefdinir  300 mg Oral Q12H SCH   ??? cholecalciferol (vitamin D3)  1,000 Units Oral BID   ??? dexamethasone without alcohol  10 mg Oral Q6H SCH   ??? famotidine  20 mg Oral Daily   ??? fluticasone propionate  2 puff Inhalation BID (RT)   ??? heparin (porcine) for subcutaneous use  5,000 Units Subcutaneous [facility]   ??? ibrutinib  140 mg Oral Daily   ??? flu vacc qs2019-20 6mos up(PF)  0.5 mL Intramuscular During hospitalization   ??? insulin lispro  0-12 Units Subcutaneous ACHS   ??? loratadine  10 mg Oral Daily   ??? methylPREDNISolone sodium succinate 125 mg Intravenous Daily   ??? montelukast  10 mg Oral Nightly   ??? posaconazole  300 mg Oral Daily   ??? ruxolitinib  10 mg Oral BID   ??? sodium chloride  10 mL Intravenous Q8H   ??? valACYclovir  500 mg Oral Daily       PRN medications:  acetaminophen, albuterol, dextrose, dibucaine, diphenhydrAMINE, EPINEPHrine IM, famotidine (PEPCID) IV, hydrOXYzine, IP okay to treat, melatonin, meperidine, methylPREDNISolone sodium succinate (PF), MORPhine injection, ondansetron, phenol, polyethylene glycol, senna, sodium chloride, sodium chloride 0.9%    Data/Imaging Review: Reviewed in Epic and personally interpreted on 08/19/2018. See EMR for detailed results.      Critical Care Attestation     This patient is critically ill or injured with the impairment of vital organ systems such that there is a high probability of imminent or life threatening deterioration in the patient's condition. This patient must remain in the ICU for ongoing evaluation of the comprehensive management plan outlined in this note. I directly provided critical care services as documented in this note and the critical care time spent (35 min) is exclusive of separately billable procedures.    Blaiden Werth Fonnie Mu, ACNP

## 2018-08-19 NOTE — Unmapped (Signed)
Pt a/oX4. NSR, sys <180. Afebrile. LBNC 10-12L/min. Uses non-rebreather when desats with exertion or during sleep. Voids in urinal. Reg diet. Continuing to monitor.       Problem: Adult Inpatient Plan of Care  Goal: Plan of Care Review  Outcome: Ongoing - Unchanged  Goal: Patient-Specific Goal (Individualization)  Outcome: Ongoing - Unchanged  Goal: Absence of Hospital-Acquired Illness or Injury  Outcome: Ongoing - Unchanged  Goal: Optimal Comfort and Wellbeing  Outcome: Ongoing - Unchanged  Goal: Readiness for Transition of Care  Outcome: Ongoing - Unchanged  Goal: Rounds/Family Conference  Outcome: Ongoing - Unchanged     Problem: Gas Exchange Impaired  Goal: Optimal Gas Exchange  Outcome: Ongoing - Unchanged     Problem: Fall Injury Risk  Goal: Absence of Fall and Fall-Related Injury  Outcome: Ongoing - Unchanged     Problem: Venous Thromboembolism  Goal: VTE (Venous Thromboembolism) Symptom Resolution  Outcome: Ongoing - Unchanged     Problem: Breathing Pattern Ineffective  Goal: Effective Breathing Pattern  Outcome: Ongoing - Unchanged     Problem: Obstructive Sleep Apnea Risk or Actual (Comorbidity Management)  Goal: Unobstructed Breathing During Sleep  Outcome: Ongoing - Unchanged     Problem: Skin Injury Risk Increased  Goal: Skin Health and Integrity  Outcome: Ongoing - Unchanged     Problem: Self-Care Deficit  Goal: Improved Ability to Complete Activities of Daily Living  Outcome: Ongoing - Unchanged     Problem: Anxiety  Goal: Anxiety Reduction or Resolution  Outcome: Ongoing - Unchanged     Problem: Noninvasive Ventilation Acute  Goal: Effective Unassisted Ventilation and Oxygenation  Outcome: Ongoing - Unchanged

## 2018-08-19 NOTE — Unmapped (Signed)
BONE MARROW TRANSPLANT AND CELLULAR THERAPY CONSULT NOTE    Patient Name: Steven Fernandez  MRN: [id number]  Admit Date: 08/05/2018    BMT Attending MD: Dr. Lance Bosch    Disease: AML  Type of Transplant: MUD RIC Bu/Flu  Transplant Day: +515    HPI:  Steven Fernandez is a 77 y.o. male with a diagnosis of AML who is s/p MUD allogenic SCT (transplant 02/2017) who was admitted for progressive dyspnea over the preceding month but with acute worsening over the week prior to discharge. CT obtained about 1 week prior to admission was suggestive of possible interstitial pneumonitis. During this time he has been evaluated by Dr. 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     While outpatient evaluation plans were being developed, Steven Fernandez respiratory status deteriorated, and he was prompted to call when he noticed that his home pulse oxygen began to decrease with readings in the high 80s. He also noted that he experienced a fever which was recorded as 101.6. He was encouraged to present to his closest ED. On initial evaluation he described the above events at noted that his appetite has been reduced. He also complaied of a dry cough. He otherwise has no other symptoms report denying any chest pain, pleuritis, nausea, vomiting, diarrhea, dysuria or hematuria. He has not had any recent sick contacts.       Interval history:   -had a great day yesterday- was able to be weaned to 8L O2 without using the nonrebreather.  Generally felt so much better.  This morning developed worsening SOB and was increased back to 15L, intermittently using NRB.    Steven Fernandez is somewhat disappointed given he had such a great day yesterday.      Review of Systems:  A full system review was performed and was negative except as noted in the above interval history.    Temp:  [36.3 ??C (97.3 ??F)-36.7 ??C (98.1 ??F)] 36.4 ??C (97.5 ??F)  Heart Rate:  [69-89] 89  SpO2 Pulse:  [47-89] 89  Resp:  [17-39] 17  BP: (121-158)/(48-86) 139/74  MAP (mmHg):  [73-100] 89  FiO2 (%):  [60 %-80 %] 60 %  SpO2:  [82 %-100 %] 98 %    I/O last 3 completed shifts:  In: 1900 [P.O.:1850; I.V.:50]  Out: 2380 [Urine:2375; Chest Tube:5]  No intake/output data recorded.    Last 5 Recorded Weights    08/05/18 0550 08/05/18 1734   Weight: 65.8 kg (145 lb) 62.1 kg (136 lb 14.5 oz)     Weight change:     Test Results:   Reviewed in EPIC. Abnormal values discussed below.     Scheduled Meds:  ??? atovaquone  1,500 mg Oral Daily   ??? azithromycin  250 mg Oral Once per day on Mon Wed Fri   ??? carboxymethylcellulose sodium  2 drop Both Eyes 4x Daily   ??? cefdinir  300 mg Oral Q12H SCH   ??? cholecalciferol (vitamin D3)  1,000 Units Oral BID   ??? dexamethasone without alcohol  10 mg Oral Q6H SCH   ??? famotidine  20 mg Oral Daily   ??? fluticasone propionate  2 puff Inhalation BID (RT)   ??? heparin (porcine) for subcutaneous use  5,000 Units Subcutaneous [facility]   ??? ibrutinib  140 mg Oral Daily   ??? flu vacc qs2019-20 6mos up(PF)  0.5 mL Intramuscular During hospitalization   ??? insulin lispro  0-12 Units  Subcutaneous ACHS   ??? loratadine  10 mg Oral Daily   ??? methylPREDNISolone sodium succinate  125 mg Intravenous Daily   ??? montelukast  10 mg Oral Nightly   ??? posaconazole  300 mg Oral Daily   ??? ruxolitinib  10 mg Oral BID   ??? sodium chloride  10 mL Intravenous Q8H   ??? valACYclovir  500 mg Oral Daily     Continuous Infusions:  ??? IP okay to treat     ??? sodium chloride       PRN Meds:.acetaminophen, albuterol, dextrose, dibucaine, diphenhydrAMINE, EPINEPHrine IM, famotidine (PEPCID) IV, hydrOXYzine, IP okay to treat, melatonin, meperidine, methylPREDNISolone sodium succinate (PF), MORPhine injection, ondansetron, phenol, polyethylene glycol, senna, sodium chloride, sodium chloride 0.9%    Physical Exam:   General: Sitting in a chair with LBNC on 15L  Neck: Right sided pheresis catheter- no erythema or drainage  Cardiovascular: RRR without murmur appreciated, warm extremities.  Lungs: Crackles on left, decreased breath sounds on left from yesterday. Out of breath with full; sentences. Having Left sided anterior chest tube in place with c/d/i dressing, fluid no longer moving in tube.  Skin: Warm, dry, intact. No rash noted.   Psychiatry: Alert and oriented to person, place, and time.   Abdomen: + bowel sounds, No distension. No tenderness elicited.  Extremeties: Trace lower extremity edema noted.  Neurologic: CNII-XII grossly intact. Moves all extremities with at least antigravity strength    08/08/18 Transbronchial biopsy:  Final Diagnosis   A: Lung, right upper lobe, allograft, transbronchial biopsy  Two fragments of alveolated lung and two fragments of  Large airway wall. The lung shows mostly intra- alveolar fibroplasia (see note) and is negative for granuloma, tumor or any specific evidence of GVH.   The GMS stain is negative for pneumocystis or fungi however there is a detached fragment of tissue with a a few filamentous bacteria. I am not certain of the significance of these organisms but will do additional stains.  The airway wall shows increased goblet cells suggestive of chronic bronchitis   ??  Note: In the proper clinical and radiographic setting the histologic  features of intra-alveolar fibroplasia would be consistent with a diagnoisis of Bronchiolitis Obliterans Organizing Pneumonia (Organizing Bronchopneumonia or Cryptogenic Organizing Pneumonia) however it is generally unwise to diagnose BOOP on the basis of a transbronchial or needle core biopsy because of the difficulty in separating Primary BOOP (where BOOP is the only histologic process) from Secondary BOOP where BOOP is a component of some other specific histologic diagnosis)       CXR 08/18/18: unchanged L pnrumothorax    08/07/18 CTA Chest:  No pulmonary embolism.    Findings are concerning for acute on chronic graft versus host disease given the progressive cystic changes since recent outside CT from 5 days ago, with the patchy peripheral consolidations representing organizing pneumonia versus less likely multifocal pneumonia. Bronchoscopy and lavage would be beneficial if patient can tolerate the procedure.      Assessment/Plan:   Steven Fernandez is a 77 y.o. male with a diagnosis of AML who is s/p MUD allogenic SCT (transplant 02/2017)  With RIC Bu/Flu condiitoning. 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 He is now admitted for dyspnea and hypoxia, found to have methemoglobinemia. The progressive cystic changes on chest CT noted on 9/10 increase our concern for pulmonary GVHD as the driver of his current hypoxic respiratory failure.      Other than this imaging,  his evaluation thus far is notable for negative respiratory viral panel, negative CMV PCR obtained as outpatient on Friday, negative serum HSV / HHV6.  EBV PCR resulted in low-level positive result at 858 copies. Previously sent fungitell and aspergillus antigen assays negative.  Bronchoscopic infectious studies negative, notably including PJP DFA.  Pathology results from transbronchial biopsy are unfortunately non-specific, though the preponderance of evidence, including non-specific but compatible pathology, imaging results, and extensive negative infectious evaluation with bronchoscopy, support steroid-refractory acute GVHD superimposed on steroid-refractory chronic GVHD as driver of hypoxic respiratory failure.    Our team previously discussed concerns that we may not be able to reverse his current pulmonary pathology, noting that he is at high risk of abrupt clinical decline, and he was encouraged to make decisions in advance regarding whether he would be accepting of intubation. Today, he has had a notable improvement in clinical symptoms and oxygen requirement.  We will continue to assess daily for trend.    Recommendations:   - Continue azithromycin per FAM treatment.  Otherwise agree with antibiotic recommendations per ID. - Prophylactic Valtrex, atovaquone and posaconazole.  - Continue FAM for presumed pulmonary GVHD. Inhaled Fluticasone 440 mcg BID, Azithromycin 250 mg on Mondays / Wednesdays /    Fridays after course of therapy complete for infectious coverage, montelukast 10 mg qhs.  - Continue solumedrol 2 mg / kg daily.   - Continue Jakafi at 10 mg BID for acute component of GVHD.  - Continue ibrutinib 140 mg daily (9/20-)  - given worsening oxygenation today- consider bedside u/s for worsening anterior PTX  - We recommend initiation of ECP if felt to be safe; however, we do not expect a rapid benefit from this intervention, and we would not strongly push for this procedure to be performed if ICU team has substantial safety concerns about transporting patient to apheresis.   - Please check EBV PCRs twice weekly on Mondays and Thursdays  - For oral complaints, continue dexamethasone mouth wash for possible oral GVHD given pale mucosa and dry mouth.    - Filamentous gram positive bacteria on bronchial biopsy are of unclear significance. Would not change antibiotic plan at this time.       Patient was seen and examined with inpatient BMT attending, Dr. Oswaldo Done.  Recommendations were discussed with primary team in person.    Nicolasa Ducking, MD  PGY4, Hematology-Oncology fellow  p [phone]

## 2018-08-19 NOTE — Unmapped (Signed)
Pt now on 10L/min LBNC. Pt comfortable and encouraged by progress. Hopeful to start working on strength by standing. Occasionally needs nonrebreather for exertion. Doing well overall. Encourage IADLs.   Problem: Adult Inpatient Plan of Care  Goal: Plan of Care Review  Outcome: Progressing  Goal: Patient-Specific Goal (Individualization)  Outcome: Progressing  Goal: Absence of Hospital-Acquired Illness or Injury  Outcome: Progressing  Goal: Optimal Comfort and Wellbeing  Outcome: Progressing  Goal: Readiness for Transition of Care  Outcome: Progressing  Goal: Rounds/Family Conference  Outcome: Progressing     Problem: Gas Exchange Impaired  Goal: Optimal Gas Exchange  Outcome: Progressing     Problem: Fall Injury Risk  Goal: Absence of Fall and Fall-Related Injury  Outcome: Progressing     Problem: Venous Thromboembolism  Goal: VTE (Venous Thromboembolism) Symptom Resolution  Outcome: Progressing     Problem: Breathing Pattern Ineffective  Goal: Effective Breathing Pattern  Outcome: Progressing     Problem: Obstructive Sleep Apnea Risk or Actual (Comorbidity Management)  Goal: Unobstructed Breathing During Sleep  Outcome: Progressing     Problem: Skin Injury Risk Increased  Goal: Skin Health and Integrity  Outcome: Progressing     Problem: Self-Care Deficit  Goal: Improved Ability to Complete Activities of Daily Living  Outcome: Progressing     Problem: Anxiety  Goal: Anxiety Reduction or Resolution  Outcome: Progressing     Problem: Noninvasive Ventilation Acute  Goal: Effective Unassisted Ventilation and Oxygenation  Outcome: Progressing

## 2018-08-20 DIAGNOSIS — R0602 Shortness of breath: Principal | ICD-10-CM

## 2018-08-20 LAB — URINALYSIS
BACTERIA: NONE SEEN /HPF
BILIRUBIN UA: NEGATIVE
BLOOD UA: NEGATIVE
GLUCOSE UA: 150 — AB
KETONES UA: NEGATIVE
LEUKOCYTE ESTERASE UA: NEGATIVE
NITRITE UA: NEGATIVE
PH UA: 5 (ref 5.0–9.0)
RBC UA: 2 /HPF (ref <=3)
SPECIFIC GRAVITY UA: 1.019 (ref 1.003–1.030)
SQUAMOUS EPITHELIAL: <1 /HPF (ref 0–5)

## 2018-08-20 LAB — MAGNESIUM
Magnesium:MCnc:Pt:Ser/Plas:Qn:: 2.4 — ABNORMAL HIGH
Magnesium:MCnc:Pt:Ser/Plas:Qn:: 2.6 — ABNORMAL HIGH

## 2018-08-20 LAB — CBC
HEMATOCRIT: 20.5 % — ABNORMAL LOW (ref 41.0–53.0)
HEMOGLOBIN: 6.9 g/dL — ABNORMAL LOW (ref 13.5–17.5)
HEMOGLOBIN: 8.4 g/dL — ABNORMAL LOW (ref 13.5–17.5)
HEMOGLOBIN: 6.5 g/dL — ABNORMAL LOW (ref 13.5–17.5)
MEAN CORPUSCULAR HEMOGLOBIN CONC: 30.3 g/dL — ABNORMAL LOW (ref 31.0–37.0)
MEAN CORPUSCULAR HEMOGLOBIN CONC: 33.8 g/dL (ref 31.0–37.0)
MEAN CORPUSCULAR HEMOGLOBIN CONC: 32 g/dL (ref 31.0–37.0)
MEAN CORPUSCULAR HEMOGLOBIN: 32.7 pg (ref 26.0–34.0)
MEAN CORPUSCULAR HEMOGLOBIN: 32.2 pg (ref 26.0–34.0)
MEAN CORPUSCULAR VOLUME: 108 fL — ABNORMAL HIGH (ref 80.0–100.0)
MEAN CORPUSCULAR VOLUME: 95.3 fL (ref 80.0–100.0)
MEAN CORPUSCULAR VOLUME: 101.1 fL — ABNORMAL HIGH (ref 80.0–100.0)
MEAN PLATELET VOLUME: 10 fL (ref 7.0–10.0)
MEAN PLATELET VOLUME: 11.2 fL — ABNORMAL HIGH (ref 7.0–10.0)
MEAN PLATELET VOLUME: 10.6 fL — ABNORMAL HIGH (ref 7.0–10.0)
PLATELET COUNT: 313 10*9/L (ref 150–440)
PLATELET COUNT: 171 10*9/L (ref 150–440)
PLATELET COUNT: 165 10*9/L (ref 150–440)
RED BLOOD CELL COUNT: 2.63 10*12/L — ABNORMAL LOW (ref 4.50–5.90)
RED BLOOD CELL COUNT: 2.02 10*12/L — ABNORMAL LOW (ref 4.50–5.90)
RED CELL DISTRIBUTION WIDTH: 17.8 % — ABNORMAL HIGH (ref 12.0–15.0)
RED CELL DISTRIBUTION WIDTH: 19.8 % — ABNORMAL HIGH (ref 12.0–15.0)
RED CELL DISTRIBUTION WIDTH: 19 % — ABNORMAL HIGH (ref 12.0–15.0)
WBC ADJUSTED: 19.1 10*9/L — ABNORMAL HIGH (ref 4.5–11.0)
WBC ADJUSTED: 45.6 10*9/L — ABNORMAL HIGH (ref 4.5–11.0)
WBC ADJUSTED: 18.6 10*9/L — ABNORMAL HIGH (ref 4.5–11.0)

## 2018-08-20 LAB — HCO3 ARTERIAL: Bicarbonate:SCnc:Pt:BldA:Qn:: 12 — ABNORMAL LOW

## 2018-08-20 LAB — BLOOD GAS CRITICAL CARE PANEL, ARTERIAL
BASE EXCESS ARTERIAL: -15.4 — ABNORMAL LOW (ref -2.0–2.0)
BASE EXCESS ARTERIAL: -2.2 — ABNORMAL LOW (ref -2.0–2.0)
CALCIUM IONIZED ARTERIAL (MG/DL): 4.33 mg/dL — ABNORMAL LOW (ref 4.40–5.40)
CALCIUM IONIZED ARTERIAL (MG/DL): 3.79 mg/dL — ABNORMAL LOW (ref 4.40–5.40)
GLUCOSE WHOLE BLOOD: 271 mg/dL
GLUCOSE WHOLE BLOOD: 214 mg/dL
HCO3 ARTERIAL: 12 mmol/L — ABNORMAL LOW (ref 22–27)
HCO3 ARTERIAL: 22 mmol/L (ref 22–27)
HEMOGLOBIN BLOOD GAS: 8 g/dL — ABNORMAL LOW (ref 13.50–17.50)
LACTATE BLOOD ARTERIAL: 18 mmol/L (ref <=1.2)
LACTATE BLOOD ARTERIAL: 10 mmol/L (ref <=1.2)
O2 SATURATION ARTERIAL: 89.2 % — ABNORMAL LOW (ref 94.0–100.0)
PCO2 ARTERIAL: 35.3 mmHg (ref 35.0–45.0)
PH ARTERIAL: 7.15 — CL (ref 7.35–7.45)
PH ARTERIAL: 7.41 (ref 7.35–7.45)
PO2 ARTERIAL: 315 mmHg — ABNORMAL HIGH (ref 80.0–110.0)
PO2 ARTERIAL: 58.9 mmHg — ABNORMAL LOW (ref 80.0–110.0)
POTASSIUM WHOLE BLOOD: 5.9 mmol/L (ref 3.4–4.6)
POTASSIUM WHOLE BLOOD: 6.2 mmol/L (ref 3.4–4.6)
SODIUM WHOLE BLOOD: 143 mmol/L (ref 135–145)
SODIUM WHOLE BLOOD: 145 mmol/L (ref 135–145)

## 2018-08-20 LAB — POTASSIUM
Potassium:SCnc:Pt:Ser/Plas:Qn:: 5.3 — ABNORMAL HIGH
Potassium:SCnc:Pt:Ser/Plas:Qn:: 5.7 — ABNORMAL HIGH
Potassium:SCnc:Pt:Ser/Plas:Qn:: 6.1
Potassium:SCnc:Pt:Ser/Plas:Qn:: 6.4

## 2018-08-20 LAB — COMPREHENSIVE METABOLIC PANEL
ALBUMIN: 2.2 g/dL — ABNORMAL LOW (ref 3.5–5.0)
ALKALINE PHOSPHATASE: 50 U/L (ref 38–126)
ALT (SGPT): 149 U/L — ABNORMAL HIGH (ref 19–72)
ALT (SGPT): 1790 U/L — ABNORMAL HIGH (ref 19–72)
ANION GAP: 7 mmol/L — ABNORMAL LOW (ref 9–15)
ANION GAP: 20 mmol/L — ABNORMAL HIGH (ref 9–15)
AST (SGOT): 41 U/L (ref 19–55)
AST (SGOT): 1309 U/L — ABNORMAL HIGH (ref 19–55)
BILIRUBIN TOTAL: 0.9 mg/dL (ref 0.0–1.2)
BILIRUBIN TOTAL: 0.9 mg/dL (ref 0.0–1.2)
BLOOD UREA NITROGEN: 103 mg/dL — ABNORMAL HIGH (ref 7–21)
BLOOD UREA NITROGEN: 99 mg/dL — ABNORMAL HIGH (ref 7–21)
BUN / CREAT RATIO: 59
BUN / CREAT RATIO: 54
CALCIUM: 7.4 mg/dL — ABNORMAL LOW (ref 8.5–10.2)
CALCIUM: 5.5 mg/dL — CL (ref 8.5–10.2)
CHLORIDE: 106 mmol/L (ref 98–107)
CHLORIDE: 101 mmol/L (ref 98–107)
CO2: 29 mmol/L (ref 22.0–30.0)
CREATININE: 1.74 mg/dL — ABNORMAL HIGH (ref 0.70–1.30)
CREATININE: 1.85 mg/dL — ABNORMAL HIGH (ref 0.70–1.30)
EGFR CKD-EPI AA MALE: 42 mL/min/{1.73_m2} — ABNORMAL LOW (ref >=60)
EGFR CKD-EPI AA MALE: 39 mL/min/{1.73_m2} — ABNORMAL LOW (ref >=60)
EGFR CKD-EPI NON-AA MALE: 36 mL/min/{1.73_m2} — ABNORMAL LOW (ref >=60)
EGFR CKD-EPI NON-AA MALE: 34 mL/min/{1.73_m2} — ABNORMAL LOW (ref >=60)
GLUCOSE RANDOM: 231 mg/dL — ABNORMAL HIGH (ref 65–179)
GLUCOSE RANDOM: 263 mg/dL — ABNORMAL HIGH (ref 65–179)
POTASSIUM: 5.7 mmol/L — ABNORMAL HIGH (ref 3.5–5.0)
POTASSIUM: 6.9 mmol/L (ref 3.5–5.0)
PROTEIN TOTAL: 4.3 g/dL — ABNORMAL LOW (ref 6.5–8.3)
PROTEIN TOTAL: 2.8 g/dL — ABNORMAL LOW (ref 6.5–8.3)
SODIUM: 136 mmol/L (ref 135–145)
SODIUM: 150 mmol/L — ABNORMAL HIGH (ref 135–145)

## 2018-08-20 LAB — BASIC METABOLIC PANEL
ANION GAP: 18 mmol/L — ABNORMAL HIGH (ref 9–15)
BLOOD UREA NITROGEN: 114 mg/dL — ABNORMAL HIGH (ref 7–21)
BUN / CREAT RATIO: 47
CALCIUM: 7.1 mg/dL — ABNORMAL LOW (ref 8.5–10.2)
CHLORIDE: 104 mmol/L (ref 98–107)
CO2: 21 mmol/L — ABNORMAL LOW (ref 22.0–30.0)
CREATININE: 2.44 mg/dL — ABNORMAL HIGH (ref 0.70–1.30)
EGFR CKD-EPI AA MALE: 28 mL/min/{1.73_m2} — ABNORMAL LOW (ref >=60)
EGFR CKD-EPI NON-AA MALE: 24 mL/min/{1.73_m2} — ABNORMAL LOW (ref >=60)
POTASSIUM: 6.6 mmol/L (ref 3.5–5.0)
SODIUM: 143 mmol/L (ref 135–145)

## 2018-08-20 LAB — BLOOD GAS CRITICAL CARE PANEL, VENOUS
BASE EXCESS VENOUS: -4 — ABNORMAL LOW (ref -2.0–2.0)
CALCIUM IONIZED VENOUS (MG/DL): 4.42 mg/dL (ref 4.40–5.40)
GLUCOSE WHOLE BLOOD: 243 mg/dL
HCO3 VENOUS: 21 mmol/L — ABNORMAL LOW (ref 22–27)
HEMOGLOBIN BLOOD GAS: 6.9 g/dL — ABNORMAL LOW (ref 13.50–17.50)
O2 SATURATION VENOUS: 29 % — ABNORMAL LOW (ref 40.0–85.0)
PCO2 VENOUS: 41 mmHg (ref 40–60)
PH VENOUS: 7.33 (ref 7.32–7.43)
PO2 VENOUS: 25 mmHg — ABNORMAL LOW (ref 30–55)
POTASSIUM WHOLE BLOOD: 5.5 mmol/L — ABNORMAL HIGH (ref 3.4–4.6)

## 2018-08-20 LAB — MEAN CORPUSCULAR HEMOGLOBIN CONC: Lab: 30.3 — ABNORMAL LOW

## 2018-08-20 LAB — INR
Lab: 2.55
Lab: 2.7

## 2018-08-20 LAB — GLUCOSE WHOLE BLOOD: Glucose:MCnc:Pt:Bld:Qn:: 214

## 2018-08-20 LAB — BILIRUBIN UA: Lab: NEGATIVE

## 2018-08-20 LAB — HEMATOCRIT: Lab: 20.5 — ABNORMAL LOW

## 2018-08-20 LAB — D-DIMER QUANTITATIVE (CH,ML,PD,ET)
Lab: 15447 — ABNORMAL HIGH
Lab: 16581 — ABNORMAL HIGH

## 2018-08-20 LAB — APTT
Coagulation surface induced:Time:Pt:PPP:Qn:Coag: 38.7
HEPARIN CORRELATION: 0.2

## 2018-08-20 LAB — FIO2 VENOUS

## 2018-08-20 LAB — EBV QUANTITATIVE PCR, BLOOD
EBV QUANT LOG: 2.45 {Log_IU}/mL — ABNORMAL HIGH (ref <0.00)
EBV QUANT: 285 [IU]/mL — ABNORMAL HIGH (ref <0)

## 2018-08-20 LAB — PHOSPHORUS
Phosphate:MCnc:Pt:Ser/Plas:Qn:: 10.4 — ABNORMAL HIGH
Phosphate:MCnc:Pt:Ser/Plas:Qn:: 4.9 — ABNORMAL HIGH

## 2018-08-20 LAB — CREATININE: Creatinine:MCnc:Pt:Ser/Plas:Qn:: 2.44 — ABNORMAL HIGH

## 2018-08-20 LAB — FIBRINOGEN LEVEL
Lab: 43 — CL
Lab: 46 — CL

## 2018-08-20 LAB — TROPONIN I: Troponin I.cardiac:MCnc:Pt:Ser/Plas:Qn:: <0.034

## 2018-08-20 LAB — PROTIME-INR
PROTIME: 29.9 s — ABNORMAL HIGH (ref 10.2–13.1)
PROTIME: 31.7 s — ABNORMAL HIGH (ref 10.2–13.1)

## 2018-08-20 LAB — HEMOGLOBIN: Lab: 8.4 — ABNORMAL LOW

## 2018-08-20 LAB — HEPARIN CORRELATION: Lab: 0.2

## 2018-08-20 LAB — PLATELET COUNT: Lab: 175

## 2018-08-20 LAB — EBV VIRAL LOAD RESULT: Lab: 0

## 2018-08-20 LAB — AST (SGOT): Aspartate aminotransferase:CCnc:Pt:Ser/Plas:Qn:: 1309 — ABNORMAL HIGH

## 2018-08-20 NOTE — Unmapped (Signed)
INTERVENTIONAL PULMONOLOGY FOLLOW-UP CONSULT NOTE    Assessment:     Assessment:   Steven Fernandez is a 77 y.o. 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 Pigtail chest tube placed 08/13/18 with decompression of tension pneumothorax.    Patient with worsening resp. Distress today and increase in size of pneumothorax while chest tube suction to -40 cm H20. No air leak noted spont or with cough. Chest tube not kinked. Chest tube flushed at bedside with 20ml sterile saline and we noted subsequent air leak spontaneously (almost as much as present on 9/20, when I last saw him). Suspect chest tube had clogged. We did place patient back on HFNC given worsening hypoxemia and his SO2 increased to 96% on HFNC 40L/100% AND nonrebreather mask.     Plan:     1. Continue chest tube to suction at -40 cm H2O.    2. STAT CXR to assess the pneumothorax. If pneumothorax worsening, or problems draining it, will probably place a surgical chest tube.    3. Please flush tube toward and away from patient with 20 cc sterile saline every shift    4. Daily chest X-ray while chest tube is in place.    5. May travel outside the unit as long as chest tube is maintained on suction at -40cm H2O.    This patient was seen and evaluated with Dr. Curt Bears.    Please call the Interventional Pulmonary fellow at [phone] with any questions.    Julieta Gutting, MD  Pulmonary & Critical Care Fellow  August 20, 2018  Pager [phone]      Subjective:     S: Developed worsening hypoxemia and respiratory distress; on 15L large bore [place] and nonrebreathermask, saturating in the mid-80s. Per RN report, no air leak in chest tube noted since change of shifts.    9/18: - conts with airleak.  On -20 CXR this am with small apical PTX.  Will repeat CXR at 2pm  9/19: slightly worsening yesterday and placed on -40 cm.  Tried off HFNC and only NRB to see if less flow stabilizes PTX.    9/20: slightly better off HFNC; conts on -40 cm.    9/21: stable, cont to -40  9/22: Inc size ptx while on HFNC, dec size on [facility]   9/23: Inc size L ptx along lateral pleural space    Objective:     Physical Examination:     BP 155/87  - Pulse 92  - Temp 36.2 ??C (Axillary)  - Resp 25  - Ht 170.2 cm (5' 7)  - Wt 62.1 kg (136 lb 14.5 oz)  - SpO2 100%  - BMI 21.44 kg/m??    General appearance - acutely ill appearing, in severe resp. Distress, barely able to speak any words; on 15L large bore [place] and 100% nonrebreather mask  Head: [place]/AT; temporal wasting, perioral cyanosis  Eyes: no scleral jaundice, EOMI  Heart - normal rate, regular rhythm, normal S1, S2, no murmurs, rubs, clicks or gallops  Chest - absent BS over L ant. Hemithorax; [place] emphysema ant. Hemithorax; chest tube in L upper ant. Chest wo evidence on kinks and wo air leak spontaneously or with his (weak) cough  Extremities - No pedal edema, no clubbing or cyanosis    Labs and Imaging:  Personally reviewed  and interpreted w Dr.Belanger  Chest X-ray:   Left-sided pneumothorax increased (particularly along the lateral pleural space). No pleural effusion.

## 2018-08-20 NOTE — Unmapped (Signed)
Per Christiane Ha Ptachinksi, onboarding for Maurine Simmering has been documented, copay has been reviewed, and delivery scheduled for 08/17/2018.    Ryver Poblete  Anders Grant   [facility] Specialty Pharmacist

## 2018-08-20 NOTE — Unmapped (Signed)
Chest Tube Insertion Procedure Note    Indications:  Clinically significant Pneumothorax    Pre-operative Diagnosis: Pneumothorax    Post-operative Diagnosis: Pneumothorax    Procedure Details   Informed consent was obtained for the procedure, including sedation.  Risks of lung perforation, hemorrhage, arrhythmia, and adverse drug reaction were discussed.     After sterile skin prep, using standard technique, a 24 French tube was placed in the left lateral 5th rib space.    Once the chest tube was secured the 14G needle decompression catheter and pigtail were removed.     Findings: Marked post-procedure airleak.    Estimated Blood Loss:  Minimal           Specimens:  None                Complications:  None; patient tolerated the procedure well.           Disposition: ICU - intubated and critically ill.           Condition: stable    Attending Attestation: I was present and scrubbed for the entire procedure

## 2018-08-20 NOTE — Unmapped (Signed)
BONE MARROW TRANSPLANT AND CELLULAR THERAPY CONSULT NOTE    Patient Name: Steven Fernandez  MRN: [id number]  Admit Date: 08/05/2018    BMT Attending MD: Dr. Lance Bosch    Disease: AML  Type of Transplant: MUD RIC Bu/Flu  Transplant Day: +521     HPI:  Steven Fernandez is a 77 y.o. male with a diagnosis of AML who is s/p MUD allogenic SCT (transplant 02/2017) who was admitted for progressive dyspnea over the preceding month but with acute worsening over the week prior to discharge. CT obtained about 1 week prior to admission was suggestive of possible interstitial pneumonitis. During this time he has been evaluated by Dr. 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     While outpatient evaluation plans were being developed, Steven Fernandez respiratory status deteriorated, and he was prompted to call when he noticed that his home pulse oxygen began to decrease with readings in the high 80s. He also noted that he experienced a fever which was recorded as 101.6. He was encouraged to present to his closest ED. On initial evaluation he described the above events at noted that his appetite has been reduced. He also complaied of a dry cough. He otherwise has no other symptoms report denying any chest pain, pleuritis, nausea, vomiting, diarrhea, dysuria or hematuria. He has not had any recent sick contacts.       Interval history:   Yesterday, patient with worsening shortness of breath, again requiring intermittent use of NRB.    Today, patient noted to have increased work of breathing.  Evaluation demonstrating a marked metabolic / lactic acidosis.  Intubation was pursued in this setting and patient suffered PEA arrest.  ACLS algorithm initiated with ROSC after 3 rounds of epinephrine with CPR.  Found to have recurrent tension pneumothorax, now decompressed, with placement of larger chest tube.    On our evaluation, patient was intubated and sedated, with MICU team finishing placement     Updates discussed with patient's wife.      Review of Systems:  A full system review was performed and was negative except as noted in the above interval history.    Temp:  [36.3 ??C (97.3 ??F)-36.5 ??C (97.7 ??F)] 36.5 ??C (97.7 ??F)  Heart Rate:  [69-89] 86  SpO2 Pulse:  [66-90] 87  Resp:  [17-33] 19  BP: (121-177)/(67-107) 138/107  MAP (mmHg):  [82-115] 115  FiO2 (%):  [60 %-80 %] 60 %  SpO2:  [82 %-100 %] 100 %    I/O last 3 completed shifts:  In: 30 [I.V.:30]  Out: 1160 [Urine:1150; Chest Tube:10]  No intake/output data recorded.    Last 5 Recorded Weights    08/05/18 0550 08/05/18 1734   Weight: 65.8 kg (145 lb) 62.1 kg (136 lb 14.5 oz)     Weight change:     Test Results:   Reviewed in EPIC. Abnormal values discussed below.     Scheduled Meds:  ??? atovaquone  1,500 mg Oral Daily   ??? azithromycin  250 mg Oral Once per day on Mon Wed Fri   ??? carboxymethylcellulose sodium  2 drop Both Eyes 4x Daily   ??? cefdinir  300 mg Oral Q12H SCH   ??? cholecalciferol (vitamin D3)  1,000 Units Oral BID   ??? dexamethasone without alcohol  10 mg Oral Q6H SCH   ??? insulin regular  10 Units Intravenous Once    Followed by   ??? dextrose  12.5 g Intravenous Once   ??? famotidine  20 mg Oral Daily   ??? fluticasone propionate  2 puff Inhalation BID (RT)   ??? heparin (porcine) for subcutaneous use  5,000 Units Subcutaneous [facility]   ??? ibrutinib  140 mg Oral Daily   ??? flu vacc [id number] 6mos up(PF)  0.5 mL Intramuscular During hospitalization   ??? insulin lispro  0-12 Units Subcutaneous ACHS   ??? loratadine  10 mg Oral Daily   ??? methylPREDNISolone sodium succinate  125 mg Intravenous Daily   ??? montelukast  10 mg Oral Nightly   ??? posaconazole  300 mg Oral Daily   ??? ruxolitinib  10 mg Oral BID   ??? sodium chloride  10 mL Intravenous Q8H   ??? valACYclovir  500 mg Oral Daily     Continuous Infusions:  ??? IP okay to treat     ??? sodium chloride     ??? sodium chloride       PRN Meds:.acetaminophen, albuterol, dextrose, dibucaine, diphenhydrAMINE, EPINEPHrine IM, famotidine (PEPCID) IV, hydrOXYzine, IP okay to treat, melatonin, meperidine, methylPREDNISolone sodium succinate (PF), MORPhine injection, ondansetron, phenol, polyethylene glycol, senna, sodium chloride, sodium chloride 0.9%    Physical Exam:   General: Intubated, sedated  Neck: Right sided pheresis catheter- no erythema or drainage  Cardiovascular: Regular pulse, warm extremities.  Lungs: Mechanically ventilated  Skin: Warm, dry, intact. No rash noted.   Psychiatry: Sedated  Abdomen: No distension.  Extremeties: Trace lower extremity edema at ankles noted.  Neurologic: Sedated, cranial nerves not testing    08/08/18 Transbronchial biopsy:  Final Diagnosis   A: Lung, right upper lobe, allograft, transbronchial biopsy  Two fragments of alveolated lung and two fragments of  Large airway wall. The lung shows mostly intra- alveolar fibroplasia (see note) and is negative for granuloma, tumor or any specific evidence of GVH.   The GMS stain is negative for pneumocystis or fungi however there is a detached fragment of tissue with a a few filamentous bacteria. I am not certain of the significance of these organisms but will do additional stains.  The airway wall shows increased goblet cells suggestive of chronic bronchitis   ??  Note: In the proper clinical and radiographic setting the histologic  features of intra-alveolar fibroplasia would be consistent with a diagnoisis of Bronchiolitis [place]iterans Organizing Pneumonia (Organizing Bronchopneumonia or Cryptogenic Organizing Pneumonia) however it is generally unwise to diagnose BOOP on the basis of a transbronchial or needle core biopsy because of the difficulty in separating Primary BOOP (where BOOP is the only histologic process) from Secondary BOOP where BOOP is a component of some other specific histologic diagnosis)       CXR 08/18/18: unchanged L pnrumothorax    08/07/18 CTA Chest:  No pulmonary embolism.    Findings are concerning for acute on chronic graft versus host disease given the progressive cystic changes since recent outside CT from 5 days ago, with the patchy peripheral consolidations representing organizing pneumonia versus less likely multifocal pneumonia. Bronchoscopy and lavage would be beneficial if patient can tolerate the procedure.      Assessment/Plan:   Mr. Kerlin is a 77 y.o. male with a diagnosis of AML who is s/p MUD allogenic SCT (transplant 02/2017)  With RIC Bu/Flu condiitoning. 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 He is now admitted for dyspnea and hypoxia, found to have methemoglobinemia. The progressive cystic changes on chest CT noted on 9/10 increase our concern for pulmonary  GVHD as the driver of his current hypoxic respiratory failure.      Other than this imaging, his evaluation thus far is notable for negative respiratory viral panel, negative CMV PCR obtained as outpatient on Friday, negative serum HSV / HHV6.  EBV PCR resulted in low-level positive result at 858 copies. Previously sent fungitell and aspergillus antigen assays negative.  Bronchoscopic infectious studies negative, notably including PJP DFA.  Pathology results from transbronchial biopsy are unfortunately non-specific, though the preponderance of evidence, including non-specific but compatible pathology, imaging results, and extensive negative infectious evaluation with bronchoscopy, support steroid-refractory acute GVHD superimposed on steroid-refractory chronic GVHD as driver of hypoxic respiratory failure.    Our team previously discussed concerns that we may not be able to reverse his current pulmonary pathology, noting that he is at high risk of abrupt clinical decline, and he was encouraged to make decisions in advance regarding whether he would be accepting of intubation. Per these conversations and similar conversations with MICU team, patient / family expressed preference for maximal supportive care including CPR and intubation if necessary with ongoing treatment of GVHD.    Recommendations:   - Continue azithromycin per FAM treatment.   - Prophylactic Valtrex, atovaquone and posaconazole.  - Continue FAM for presumed pulmonary GVHD. Inhaled Fluticasone 440 mcg BID, Azithromycin 250 mg on Mondays / Wednesdays / Fridays, montelukast 10 mg qhs.  - Continue solumedrol 2 mg / kg (current dose 9/17-) daily  - Continue Jakafi at 10 mg BID (current dose 9/15-) for acute component of GVHD.  - Continue ibrutinib 140 mg daily (9/20-).  Dose reduced for interaction with posaconazole.  Our pharmacy team will work with MICU pharmacy to coordinate administration of opened capsules.  - In setting of marked lactic acidosis in immunosuppressed patient, not improved s/p intubation, we would recommend re-culturing and resumption of broad spectrum antimicrobial coverage, particularly covering GNRs.  - Please check EBV PCRs twice weekly on Mondays and Thursdays    Patient was seen and examined with inpatient BMT attending, Dr. Norlene Campbell.  Recommendations were discussed with primary team.     Laurian Brim, MD  Hematology and Oncology Fellow

## 2018-08-20 NOTE — Unmapped (Signed)
MICU Daily Progress Note     Date of Service: 08/20/2018    Problem List:   Principal Problem:    Graft-versus-host disease, acute (CMS-HCC)  Active Problems:    Recurrent Clostridium difficile diarrhea    History of allogeneic stem cell transplant (CMS-HCC)    Acute respiratory failure with hypoxia (CMS-HCC)    Fever    Methemoglobinemia    Chronic kidney disease    Pneumothorax on left    Chronic graft-versus-host disease (CMS-HCC)  Resolved Problems:    * No resolved hospital problems. *      Interval history: SAAMIR ARMSTRONG is a 77 y.o. 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 Now with increasing O2 requirements, on HFNC    24hr events:   Pigtail clogged overnight, w/ increased size of pneumo. Increased wob and anxiety this morning. IP to place larger CT.     Dispo: ICU    Neurological   #throat pain- improved 9/18  - could be GVHD.    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 Infection given leukocytosis and fever. Infectious work up has been negative thus far, however patient with worsening respiratory status. Decided to start IV steroids for treatment of BOOP. Possible component of methemoglobinemia on O2 sats, should obtain ABGs for most accurate information.   - cont wean HFNC as tol  - Continue flluticasone, azithro, montelukast (FAM)  - Continue PRN duonebs  - 9/11: intubated for bronch for d/t progressive cystic changes on CT chest 9/10 concerning for aucte on chronic GVHD (no evid of PE).  Extubated early am on 9/12.  Initially on HFNC 50% 45L, now increased to 80-100% FiO2  - Abx as below  - ICID following   - Heme-onc following   - inc solumedrol 2mg /kg IV (125 mg) x 2 weeks, azitro Mon/wed/friday after CAP tx >>completed   - fluticasone INH 2very 12 hrs   - montelukast q hs   - cont atovaquone for PJP ppx   - Continue Jakafi 10 mg bid    - Started ibrutinib 9/20   - consulted transfusion medicine for extracorporal photopheresis.n if needed. Awaiting oxygen stability.       COPD: 40 pack year smoking history.   - Continue home anoro ellipta  - Duonebs PRN for wheezing  ??  OSA: Home CPAP QHS, uses 2L bleed in O2 at baseline just as of recent (last week) pulmonology appointment  - Home CPAP on hold     L PTX: progressive tachypnea and wob, abg unchanged, cxr revealed large spont ptx  - IP placed emergent pigtail 9/16, tolerated well w versed and fent  - f/u CXR w successful evacuation of ptx, symptoms immed improved  9/18: - conts with airleak.  On -20 CXR this am with small apical PTX.  Will repeat CXR at 2pm  9/19: slightly worsening yesterday and placed on -40 cm.  Again slightly worse today.   Will try off HFNC and only NRB to see if less flow stabilizes PTX.  Appreciate IP's assistance.    9/20: slightly better off HFNC.  But conts on -40 cm.    9/21: stable, cont to -40  9/22: Inc size ptx while  On HFNC, dec size on  [facility]   9/23: Increased again this AM w/ clogged pigtail. Will change out for larger CT    Cardiovascular   HTN: likely 2/2 steroids  - hemodynamics improved after chest tube placed  - cont Coreg 25mg  bid  - added norvasc  9/19: lower BP yesterday and meds d/c'd. Improved BP again today.  If needed, will add back coreg.    9/20-9/21: BP stable    Renal   CKD: Baseline 1.5-1.7, elevated to 1.9 on presentation, but now within baseline  - adeq uop, voiding   - goal euvolemia   9/19-9/21: cr stable.  Good uop.    9/23: Increased again this morning, but still near baseline    Infectious Disease/Autoimmune   Fever in immunocompromised host, elevated EBV PCR: Inflammatory from GVHD vs. Infectious fever? Infectious work up negative thus far, with several pending studies. Started high dose IV steroids.  - Lower respiratory culture  - Viral PCRs: EBV, HSV, HHV6  - F/u BCx, UCx >> ngtd x 2, resp sample inadeq  - Antimicrobials:              - Linezolid (9/8 - 9/11)              - Cefepime (9/8 -9/11 )              - Azithromycin (9/6 - 9/16)    - ceftriaxone (9/11-->9/17)   - cefdinir (9/18-->)  - PPx: valtrex for ppx, - Voriconazole (9/9 -9/10) changed to posaconazole 9/10 for ppx, azithro m/w/f  - BAL cx: neg.  Fungal cx neg,  f/u additional studies    FEN/GI   Hx of SIBO: pt reports not taking rifaximin  ??  Hx of recurrent C Diff s/p Fecal Transplant: Stooling currently at baseline, will follow closely while giving high dose antibiotics.  ??  Hx of [place] disease: f/u daily LFTs    Malnutrition Assessment: Not done yet.   - reg diet, poor appetite  - ordered      Heme/Coag   MDS-AML s/p BMT: s/p MUD allogenic SCT (transplant 02/2017)??with RIC Bu/Flu conditioning with post-transplant course complicated with severe C diff and cutaneous GVHD. There is now concern for pulmonary GVHD, although wondering if it is expected to occur with the more common forms of cutaneous/GI GVHD since other symptoms are not present.  - Primary transplant oncologist: Dr. Lance Bosch  - F/u with BMT team daily >> f/u weekly  Every mon/thursdays  - cont IV steroids for pulmonary GVHD: see pulm for GVHD plan  - CTA neg for PE  - SCDs  and heparin for DVT ppx      Methemoglobinemia 2/2 dapsone use: (Resolved) 11% at presentation, downtrending since discontinuation of dapsone. It is expected to lower O2 sat falsely due to altered binding of oxygen, will obtain ABGs for more accurate O2 levels.   - D/c'd dapsone  - Trend methemoglobin >> downtrend, poison control following,   - Will hold on methylene blue d/t c/f inducing methemoglobinemia given high O2 requirements. Will trial high dose Vit C as alternative. --improved and discontinued.    Dapsone changed to atovaquone for PJP ppx  ??  Endocrine   H/o steroid induced hyperglycemia: Now on IV steroids, will monitor for hyperglycemia  - POCT glucose  - SSI    Prophylaxis/LDA/Restraints/Consults   Can CVC be removed? No, cont PICC for difficult access and blood draws, dialysis catheter for apheresis  Can A-line be removed? N/A, no A-line present  Can Foley  be removed? N/A, no Foley present  Mobility plan: Step 5 - Bed to chair assessment    Feeding: Oral diet   Analgesia: No pain issues  Sedation SAT/SBT: N/A  Thrombembolic ppx: [place] hep  Head of bed >30 degrees: Yes  Ulcer ppx: Yes, home use continued  Glucose within target range: Yes, in range    RASS at goal? N/A, not on sedation  [place] Agitation Assessment Scale (RASS) : -1 (08/20/2018  4:00 AM)     Can antipsychotics be stopped? N/A, not on antipsychotics  CAM-ICU Result: Negative (08/19/2018  8:00 PM)      Patient Lines/Drains/Airways Status    Active Active Lines, Drains, & Airways     Name:   Placement date:   Placement time:   Site:   Days:    Apheresis Catheter 08/16/18   08/16/18    1600    Internal jugular   3    Chest Drainage System 1 Left Pleural 14 Fr.   08/13/18    1030    Pleural   7    PICC Single Lumen 08/14/18 Right Basilic   08/14/18    1521    Basilic   5    Peripheral IV 16/10/96 Left Arm   08/06/18    1200    Arm   13              Patient Lines/Drains/Airways Status    Active Wounds     Name:   Placement date:   Placement time:   Site:   Days:    Wound 08/05/18 Skin Tear Arm Left small skin tear near elbow   08/05/18    1800    Arm   14                Goals of Care     Code Status: Full Code    Designated Healthcare Decision Maker:  Mr. Drone currently has decisional capacity for healthcare decision-making and is able to designate a surrogate healthcare decision maker. Mr. Barnette designated healthcare decision maker(s) is/are Canaan Holzer (the patient's spouse) as denoted by stated patient preference.      Subjective     Slept well.  Feels better and not requiring 100% NRB in addition to his large bore [place] as much.      Objective     Vitals - past 24 hours  Temp:  [36.1 ??C (97 ??F)-36.5 ??C (97.7 ??F)] 36.1 ??C (97 ??F)  Heart Rate:  [69-94] 94  SpO2 Pulse:  [66-95] 95  Resp:  [17-38] 38  BP: (121-177)/(65-107) 149/82  FiO2 (%):  [60 %-100 %] 100 %  SpO2:  [82 %-100 %] 98 % Intake/Output  I/O last 3 completed shifts:  In: 30 [I.V.:30]  Out: 1160 [Urine:1150; Chest Tube:10]     Physical Exam:    General: Well appearing male,   HEENT: Atraumatic, normocephalic, EOMI, MMM  CV: RRR, no murmurs  Pulm: coarse breath sounds bilaterally AP,  L pigtail- C/d/I at site.  With air leak  GI: soft, nontender, nondistended, +BS  MSK: moves all four extremities spontaneously, no edema  Skin: no rash or jaundice, no cutaneous manifestations of GVHD expect small area on L arm    Neuro exam:   Mental status: GCS 15, A&Ox4, calm approp  Speech/Language: names/repeats, intact  Cranial nerves: Perrl 3Br, VFF, EOMi, gaze mdl, no diploplia,  V1-3 intact, face symm, uvula mdl/upgoing, tong mdl, shoulders full/equal   Motor: BUE 5/5, BLE  5/5, no drift, normal bulk and tone   Sensory: intact       Continuous Infusions:   ??? IP okay to treat     ??? sodium chloride     ??? sodium chloride         Scheduled Medications:   ??? atovaquone  1,500 mg Oral Daily   ??? azithromycin  250 mg Oral Once per day on Mon Wed Fri   ??? carboxymethylcellulose sodium  2 drop Both Eyes 4x Daily   ??? cefdinir  300 mg Oral Q12H SCH   ??? cholecalciferol (vitamin D3)  1,000 Units Oral BID   ??? dexamethasone without alcohol  10 mg Oral Q6H SCH   ??? famotidine  20 mg Oral Daily   ??? fluticasone propionate  2 puff Inhalation BID (RT)   ??? heparin (porcine) for subcutaneous use  5,000 Units Subcutaneous [facility]   ??? ibrutinib  140 mg Oral Daily   ??? flu vacc [id number] 6mos up(PF)  0.5 mL Intramuscular During hospitalization   ??? insulin lispro  0-12 Units Subcutaneous ACHS   ??? insulin NPH  5 Units Subcutaneous Q12H [facility]   ??? loratadine  10 mg Oral Daily   ??? methylPREDNISolone sodium succinate  125 mg Intravenous Daily   ??? montelukast  10 mg Oral Nightly   ??? posaconazole  300 mg Oral Daily   ??? ruxolitinib  10 mg Oral BID   ??? sodium chloride  10 mL Intravenous Q8H   ??? valACYclovir  500 mg Oral Daily       PRN medications:  acetaminophen, albuterol, dextrose, dibucaine, diphenhydrAMINE, EPINEPHrine IM, famotidine (PEPCID) IV, hydrOXYzine, IP okay to treat, melatonin, meperidine, methylPREDNISolone sodium succinate (PF), MORPhine injection, ondansetron, phenol, polyethylene glycol, senna, sodium chloride, sodium chloride 0.9%    Data/Imaging Review: Reviewed in Epic and personally interpreted on 08/20/2018. See EMR for detailed results.      Critical Care Attestation     This patient is critically ill or injured with the impairment of vital organ systems such that there is a high probability of imminent or life threatening deterioration in the patient's condition. This patient must remain in the ICU for ongoing evaluation of the comprehensive management plan outlined in this note. I directly provided critical care services as documented in this note and the critical care time spent ( ) is exclusive of separately billable procedures.    Daryel November, ACNP

## 2018-08-20 NOTE — Unmapped (Signed)
Intubation    Verbal consent obtained?: Yes    Risks and benefits: Risks, benefits and alternatives were discussed    Consent given by:  Wife  Patient states understanding of procedure being performed: Yes    Patient identity confirmed:  Arm band  Pre-sedation Assessment:     NPO status caution: Urgency dictates proceeding without considering.      ASA classification: 4      Neck mobility: Normal      Mouth opening:  3 Fingers    Mallampati score:  2    History of difficult intubation: No    Indications:  Respiratory Failure  Intubation method:  Video-assisted  Preoxygenation:  BiPAP at 100%  Sedation:  40 mcg propofol.  Paralytic:  Rocuronium.   Laryngoscope size:  4 MAC  Tube size (mm):  7.5  Tube type:  Cuffed  Number of attempts:  2 (1 by attending)  Cricoid pressure: No    Cords visualized: Yes    Post-procedure assessment:  Chest rise and ETCO2 monitor  Breath sounds:  Equal  Cuff inflated: Yes    ETT to lip (cm):  25  Tube secured with:  ETT holder  Chest x-ray interpreted by:  Personally Interpreted.  Chest x-ray findings:  Adequate Positioning.   Patient tolerance:  Developed cardiac arrest post intubation. See progress note by Caesar Chestnut for details. Likely 2/2 tension pneumothorax.     I was present for the entirety of the procedure(s). Uri Covey Curt Bears, MD

## 2018-08-20 NOTE — Unmapped (Signed)
Pt a/oX4. LBNC 14L/min and uses non-rebreather to recover spo2 >88% or per pt request to mid 80s when asymptomatic. NSR, sys <180. Afebrile. Voids in urinal. 1 BM this shift. L chest tube monitored. Reg diet. Continuing to monitor.       Problem: Adult Inpatient Plan of Care  Goal: Plan of Care Review  Outcome: Ongoing - Unchanged  Goal: Patient-Specific Goal (Individualization)  Outcome: Ongoing - Unchanged  Goal: Absence of Hospital-Acquired Illness or Injury  Outcome: Ongoing - Unchanged  Goal: Optimal Comfort and Wellbeing  Outcome: Ongoing - Unchanged  Goal: Readiness for Transition of Care  Outcome: Ongoing - Unchanged  Goal: Rounds/Family Conference  Outcome: Ongoing - Unchanged     Problem: Gas Exchange Impaired  Goal: Optimal Gas Exchange  Outcome: Ongoing - Unchanged     Problem: Fall Injury Risk  Goal: Absence of Fall and Fall-Related Injury  Outcome: Ongoing - Unchanged     Problem: Venous Thromboembolism  Goal: VTE (Venous Thromboembolism) Symptom Resolution  Outcome: Ongoing - Unchanged     Problem: Breathing Pattern Ineffective  Goal: Effective Breathing Pattern  Outcome: Ongoing - Unchanged     Problem: Obstructive Sleep Apnea Risk or Actual (Comorbidity Management)  Goal: Unobstructed Breathing During Sleep  Outcome: Ongoing - Unchanged     Problem: Skin Injury Risk Increased  Goal: Skin Health and Integrity  Outcome: Ongoing - Unchanged     Problem: Self-Care Deficit  Goal: Improved Ability to Complete Activities of Daily Living  Outcome: Ongoing - Unchanged     Problem: Anxiety  Goal: Anxiety Reduction or Resolution  Outcome: Ongoing - Unchanged     Problem: Noninvasive Ventilation Acute  Goal: Effective Unassisted Ventilation and Oxygenation  Outcome: Ongoing - Unchanged

## 2018-08-20 NOTE — Unmapped (Signed)
MICU Daily Progress Note     Date of Service: 08/20/2018    Problem List:   Principal Problem:    Graft-versus-host disease, acute (CMS-HCC)  Active Problems:    Recurrent Clostridium difficile diarrhea    History of allogeneic stem cell transplant (CMS-HCC)    Acute respiratory failure with hypoxia (CMS-HCC)    Fever    Methemoglobinemia    Chronic kidney disease    Pneumothorax on left    Chronic graft-versus-host disease (CMS-HCC)  Resolved Problems:    * No resolved hospital problems. *      Interval history: Steven Fernandez is a 77 y.o. 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 Now with increasing O2 requirements, on HFNC    24hr events:   Inc size ptx while  On HFNC, dec size on LBNC, cont -40cm sxn, activity as tol, DOE easily     Dispo: ICU    Neurological   #throat pain- improved 9/18  - could be GVHD.    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 Infection given leukocytosis and fever. Infectious work up has been negative thus far, however patient with worsening respiratory status. Decided to start IV steroids for treatment of BOOP. Possible component of methemoglobinemia on O2 sats, should obtain ABGs for most accurate information.   - cont wean HFNC as tol  - Continue flluticasone, azithro, montelukast (FAM)  - Continue PRN duonebs  - 9/11: intubated for bronch for d/t progressive cystic changes on CT chest 9/10 concerning for aucte on chronic GVHD (no evid of PE).  Extubated early am on 9/12.  Initially on HFNC 50% 45L, now increased to 80-100% FiO2  - Abx as below  - ICID following   - Heme-onc following   - inc solumedrol 2mg /kg IV (125 mg) x 2 weeks, azitro Mon/wed/friday after CAP tx >>completed   - fluticasone INH 2very 12 hrs   - montelukast q hs   - cont atovaquone for PJP ppx   - Continue Jakafi 10 mg bid    - 9/19:  Awaiting approval for ibrutinib and consulted transfusion medicine for extracorporal photopheresis. Onc talking with pt's son today.  Re-discussing  GOC.:  Ok with intubation if needed.     9/20:  Pheresis unable to take pt until this afternoon and ONLY if he can travel safely from oxygenation standpoint logistically-- we decided to hold off today.  Reassess on Monday Irburtinib started.      COPD: 40 pack year smoking history.   - Continue home anoro ellipta  - Duonebs PRN for wheezing  ??  OSA: Home CPAP QHS, uses 2L bleed in O2 at baseline just as of recent (last week) pulmonology appointment  - Home CPAP on hold     L PTX: progressive tachypnea and wob, abg unchanged, cxr revealed large spont ptx  - IP placed emergent pigtail 9/16, tolerated well w versed and fent  - f/u CXR w successful evacuation of ptx, symptoms immed improved  9/18: - conts with airleak.  On -20 CXR this am with small apical PTX.  Will repeat CXR at 2pm  9/19: slightly worsening yesterday and placed on -40 cm.  Again slightly worse today.   Will try off HFNC  and only NRB to see if less flow stabilizes PTX.  Appreciate IP's assistance.    9/20: slightly better off HFNC.  But conts on -40 cm.    9/21: stable, cont to -40  9/22: Inc size ptx while  On HFNC, dec size on [facility]     Cardiovascular   HTN: likely 2/2 steroids  - hemodynamics improved after chest tube placed  - cont Coreg 25mg  bid  - added norvasc  9/19: lower BP yesterday and meds d/c'd. Improved BP again today.  If needed, will add back coreg.    9/20-9/21: BP stable    Renal   CKD: Baseline 1.5-1.7, elevated to 1.9 on presentation, but now within baseline  - adeq uop, voiding   - goal euvolemia   9/19-9/21: cr stable.  Good uop.      Infectious Disease/Autoimmune   Fever in immunocompromised host, elevated EBV PCR: Inflammatory from GVHD vs. Infectious fever? Infectious work up negative thus far, with several pending studies. Started high dose IV steroids.  - Lower respiratory culture  - Viral PCRs: EBV, HSV, HHV6  - F/u BCx, UCx >> ngtd x 2, resp sample inadeq  - Antimicrobials:              - Linezolid (9/8 - 9/11)              - Cefepime (9/8 -9/11 )              - Azithromycin (9/6 - 9/16)    - ceftriaxone (9/11-->9/17)   - cefdinir (9/18-->)  - PPx: valtrex for ppx, - Voriconazole (9/9 -9/10) changed to posaconazole 9/10 for ppx, azithro m/w/f  - BAL cx: neg.  Fungal cx neg,  f/u additional studies    FEN/GI   Hx of SIBO: pt reports not taking rifaximin  ??  Hx of recurrent C Diff s/p Fecal Transplant: Stooling currently at baseline, will follow closely while giving high dose antibiotics.  ??  Hx of [place] disease: f/u daily LFTs    Malnutrition Assessment: Not done yet.   - reg diet, poor appetite  - ordered      Heme/Coag   MDS-AML s/p BMT: s/p MUD allogenic SCT (transplant 02/2017)??with RIC Bu/Flu conditioning with post-transplant course complicated with severe C diff and cutaneous GVHD. There is now concern for pulmonary GVHD, although wondering if it is expected to occur with the more common forms of cutaneous/GI GVHD since other symptoms are not present.  - Primary transplant oncologist: Dr. Lance Bosch  - F/u with BMT team daily >> f/u weekly  Every mon/thursdays  - cont IV steroids for pulmonary GVHD: see pulm for GVHD plan  - CTA neg for PE  - SCDs  and heparin for DVT ppx      Methemoglobinemia 2/2 dapsone use: (Resolved) 11% at presentation, downtrending since discontinuation of dapsone. It is expected to lower O2 sat falsely due to altered binding of oxygen, will obtain ABGs for more accurate O2 levels.   - D/c'd dapsone  - Trend methemoglobin >> downtrend, poison control following,   - Will hold on methylene blue d/t c/f inducing methemoglobinemia given high O2 requirements. Will trial high dose Vit C as alternative. --improved and discontinued.    Dapsone changed to atovaquone for PJP ppx  ??  Endocrine   H/o steroid induced hyperglycemia: Now on IV steroids, will monitor for hyperglycemia  - POCT glucose  - SSI    Prophylaxis/LDA/Restraints/Consults   Can CVC be removed? No, cont  PICC for difficult access and blood draws, dialysis catheter for apheresis  Can A-line be removed? N/A, no A-line present  Can Foley be removed? N/A, no Foley present  Mobility plan: Step 5 - Bed to chair assessment    Feeding: Oral diet   Analgesia: No pain issues  Sedation SAT/SBT: N/A  Thrombembolic ppx: [place] hep  Head of bed >30 degrees: Yes  Ulcer ppx: Yes, home use continued  Glucose within target range: Yes, in range    RASS at goal? N/A, not on sedation  [place] Agitation Assessment Scale (RASS) : -1 (08/20/2018  4:00 AM)     Can antipsychotics be stopped? N/A, not on antipsychotics  CAM-ICU Result: Negative (08/19/2018  8:00 PM)      Patient Lines/Drains/Airways Status    Active Active Lines, Drains, & Airways     Name:   Placement date:   Placement time:   Site:   Days:    Apheresis Catheter 08/16/18   08/16/18    1600    Internal jugular   3    Chest Drainage System 1 Left Pleural 14 Fr.   08/13/18    1030    Pleural   7    PICC Single Lumen 08/14/18 Right Basilic   08/14/18    1521    Basilic   5    Peripheral IV 65/78/46 Left Arm   08/06/18    1200    Arm   13              Patient Lines/Drains/Airways Status    Active Wounds     Name:   Placement date:   Placement time:   Site:   Days:    Wound 08/05/18 Skin Tear Arm Left small skin tear near elbow   08/05/18    1800    Arm   14                Goals of Care     Code Status: Full Code    Designated Healthcare Decision Maker:  Mr. Stutz currently has decisional capacity for healthcare decision-making and is able to designate a surrogate healthcare decision maker. Mr. Ragain designated healthcare decision maker(s) is/are Kamani Lewter (the patient's spouse) as denoted by stated patient preference.      Subjective Slept well.  Feels better and not requiring 100% NRB in addition to his large bore Jack as much.      Objective     Vitals - past 24 hours  Temp:  [36.1 ??C (97 ??F)-36.5 ??C (97.7 ??F)] 36.1 ??C (97 ??F)  Heart Rate:  [69-94] 94  SpO2 Pulse:  [66-95] 95  Resp:  [17-38] 38  BP: (121-177)/(65-107) 149/82  FiO2 (%):  [60 %-100 %] 100 %  SpO2:  [82 %-100 %] 98 % Intake/Output  I/O last 3 completed shifts:  In: 30 [I.V.:30]  Out: 1160 [Urine:1150; Chest Tube:10]     Physical Exam:    General: Well appearing male,   HEENT: Atraumatic, normocephalic, EOMI, MMM  CV: RRR, no murmurs  Pulm: coarse breath sounds bilaterally AP,  L pigtail- C/d/I at site.  With air leak  GI: soft, nontender, nondistended, +BS  MSK: moves all four extremities spontaneously, no edema  Skin: no rash or jaundice, no cutaneous manifestations of GVHD expect small area on L arm    Neuro exam:   Mental status: GCS 15, A&Ox4, calm approp  Speech/Language: names/repeats, intact  Cranial nerves: Perrl 3Br, VFF, EOMi, gaze mdl,  no diploplia,  V1-3 intact, face symm, uvula mdl/upgoing, tong mdl, shoulders full/equal   Motor: BUE 5/5, BLE 5/5, no drift, normal bulk and tone   Sensory: intact       Continuous Infusions:   ??? IP okay to treat     ??? sodium chloride     ??? sodium chloride         Scheduled Medications:   ??? atovaquone  1,500 mg Oral Daily   ??? azithromycin  250 mg Oral Once per day on Mon Wed Fri   ??? carboxymethylcellulose sodium  2 drop Both Eyes 4x Daily   ??? cefdinir  300 mg Oral Q12H SCH   ??? cholecalciferol (vitamin D3)  1,000 Units Oral BID   ??? dexamethasone without alcohol  10 mg Oral Q6H SCH   ??? famotidine  20 mg Oral Daily   ??? fluticasone propionate  2 puff Inhalation BID (RT)   ??? heparin (porcine) for subcutaneous use  5,000 Units Subcutaneous [facility]   ??? ibrutinib  140 mg Oral Daily   ??? flu vacc [id number] 6mos up(PF)  0.5 mL Intramuscular During hospitalization   ??? insulin lispro  0-12 Units Subcutaneous ACHS   ??? insulin NPH  5 Units Subcutaneous Q12H [facility]   ??? loratadine  10 mg Oral Daily   ??? methylPREDNISolone sodium succinate  125 mg Intravenous Daily   ??? montelukast  10 mg Oral Nightly   ??? posaconazole  300 mg Oral Daily   ??? ruxolitinib  10 mg Oral BID   ??? sodium chloride  10 mL Intravenous Q8H   ??? valACYclovir  500 mg Oral Daily       PRN medications:  acetaminophen, albuterol, dextrose, dibucaine, diphenhydrAMINE, EPINEPHrine IM, famotidine (PEPCID) IV, hydrOXYzine, IP okay to treat, melatonin, meperidine, methylPREDNISolone sodium succinate (PF), MORPhine injection, ondansetron, phenol, polyethylene glycol, senna, sodium chloride, sodium chloride 0.9%    Data/Imaging Review: Reviewed in Epic and personally interpreted on 08/20/2018. See EMR for detailed results.      Critical Care Attestation     This patient is critically ill or injured with the impairment of vital organ systems such that there is a high probability of imminent or life threatening deterioration in the patient's condition. This patient must remain in the ICU for ongoing evaluation of the comprehensive management plan outlined in this note. I directly provided critical care services as documented in this note and the critical care time spent (35 min) is exclusive of separately billable procedures.    Daryel November, ACNP

## 2018-08-20 NOTE — Unmapped (Addendum)
PULMONARY & CRITICAL CARE MEDICINE    MICU APP Critical Care Note:       Mr. Alkire is a 77 y.o. male who is critically ill with acute on chronic hypoxic respiratory failure likely 2/2 GVHD, c/b spontaneous pneumothorax now s/p pigtail placement.    On assessment this morning the patient looked much weaker and tired as compared to when last seen (1 week ago). His WOB was increased and his ability to talk in full sentences very limited.     ASSESSMENT & PLAN  - Repeat CXR showed enlarged pneumothorax. IP paged and assessed pt. Found CT to be clogged. Flushed, seemed improved, and they left.   - Pt's wob worsened, and he became fatigued, requesting CPAP.   - Placed on CPAP  - IP paged to reassess, and decision made to put in larger CT  - IP requested to intubate d/t poor oxygen sat wave, although ABG showed adequate oxygenation. It did, however, reveal a dramatically worsened pH and lactate from this morning.   - We proceeded w/ intubation, during which time he lost a pulse. He received a total of 4 rounds of CPR and code drugs (see code doc for full list).   - Chest wall asymmetry worsened during CPR and a needle decompression was performed. ROSC was achieved by next pulse check.  - Arterial line and larger CT placed  - Pt weaned off norepi gtt initially, now restarted w/ vaso  - Sedation orders placed  - Updated wife (at bedside) and son (by phone).   - Will f/u lab results.     - CXR ordered   - Infectious workup ordered, although ? Whether this was not a result of tension pneumo vs infectious process    I independently spent 120 minutes, excluding procedures, in critical care time examining the patient, evaluating the hemodynamic, laboratory, and radiographic data, developing a comprehensive management plan, and serially assessing the patient's response to our critical care interventions for the ongoing treatment of this patient's hypoxia induced cardiac arrest.     Caesar Chestnut, NP

## 2018-08-20 NOTE — Unmapped (Signed)
ARTERIAL LINE (A-Line) PLACEMENT    Date: 08/20/2018  Time: 12:18 PM  Indication: Hemodynamic monitoring  Attending: Ala Dach, H    A time-out was completed verifying correct patient, procedure, site, positioning, and special equipment if applicable. Freida Busman???s test was performed to ensure adequate perfusion. The patient???s right wrist was prepped and draped in sterile fashion. 1% Lidocaine was used to anesthetize the area. A 20 G Arrow line was introduced into the radial artery. The catheter was threaded over the guide wire and the needle was removed with appropriate pulsatile blood return. The catheter was then sutured in place to the skin and a sterile dressing applied. Perfusion to the extremity distal to the point of catheter insertion was checked and found to be adequate.  A pressure transducer was connected sterilely to the arterial line and an arterial line waveform was noted on the monitor.     Estimated Blood Loss: 1 ml  The patient tolerated the procedure well and there were no complications.

## 2018-08-21 ENCOUNTER — Encounter (HOSPITAL_BASED_OUTPATIENT_CLINIC_OR_DEPARTMENT_OTHER): Payer: Medicare Other

## 2018-08-21 DIAGNOSIS — R0602 Shortness of breath: Principal | ICD-10-CM

## 2018-08-21 LAB — D-DIMER QUANTITATIVE (CH,ML,PD,ET)
Lab: 11466 — ABNORMAL HIGH
Lab: 12514 — ABNORMAL HIGH
Lab: 12992 — ABNORMAL HIGH

## 2018-08-21 LAB — BASIC METABOLIC PANEL
BLOOD UREA NITROGEN: 127 mg/dL — ABNORMAL HIGH (ref 7–21)
CALCIUM: 7.5 mg/dL — ABNORMAL LOW (ref 8.5–10.2)
CHLORIDE: 103 mmol/L (ref 98–107)
CO2: 26 mmol/L (ref 22.0–30.0)
CREATININE: 2.66 mg/dL — ABNORMAL HIGH (ref 0.70–1.30)
EGFR CKD-EPI AA MALE: 25 mL/min/{1.73_m2} — ABNORMAL LOW (ref >=60)
EGFR CKD-EPI NON-AA MALE: 22 mL/min/{1.73_m2} — ABNORMAL LOW (ref >=60)
GLUCOSE RANDOM: 126 mg/dL (ref 65–179)
POTASSIUM: 6.3 mmol/L (ref 3.5–5.0)
SODIUM: 142 mmol/L (ref 135–145)

## 2018-08-21 LAB — CBC
HEMATOCRIT: 21.3 % — ABNORMAL LOW (ref 41.0–53.0)
HEMOGLOBIN: 7.5 g/dL — ABNORMAL LOW (ref 13.5–17.5)
HEMOGLOBIN: 7.1 g/dL — ABNORMAL LOW (ref 13.5–17.5)
MEAN CORPUSCULAR HEMOGLOBIN CONC: 32.7 g/dL (ref 31.0–37.0)
MEAN CORPUSCULAR HEMOGLOBIN CONC: 33.5 g/dL (ref 31.0–37.0)
MEAN CORPUSCULAR HEMOGLOBIN: 31 pg (ref 26.0–34.0)
MEAN CORPUSCULAR HEMOGLOBIN: 32 pg (ref 26.0–34.0)
MEAN CORPUSCULAR VOLUME: 94.7 fL (ref 80.0–100.0)
MEAN CORPUSCULAR VOLUME: 95.4 fL (ref 80.0–100.0)
MEAN PLATELET VOLUME: 11.4 fL — ABNORMAL HIGH (ref 7.0–10.0)
MEAN PLATELET VOLUME: 12.1 fL — ABNORMAL HIGH (ref 7.0–10.0)
PLATELET COUNT: 161 10*9/L (ref 150–440)
PLATELET COUNT: 132 10*9/L — ABNORMAL LOW (ref 150–440)
RED BLOOD CELL COUNT: 2.42 10*12/L — ABNORMAL LOW (ref 4.50–5.90)
RED BLOOD CELL COUNT: 2.23 10*12/L — ABNORMAL LOW (ref 4.50–5.90)
RED CELL DISTRIBUTION WIDTH: 20 % — ABNORMAL HIGH (ref 12.0–15.0)
RED CELL DISTRIBUTION WIDTH: 20.8 % — ABNORMAL HIGH (ref 12.0–15.0)
WBC ADJUSTED: 54.6 10*9/L (ref 4.5–11.0)
WBC ADJUSTED: 53 10*9/L (ref 4.5–11.0)

## 2018-08-21 LAB — BLOOD GAS CRITICAL CARE PANEL, ARTERIAL
BASE EXCESS ARTERIAL: 1.4 (ref -2.0–2.0)
BASE EXCESS ARTERIAL: -5.2 — ABNORMAL LOW (ref -2.0–2.0)
BASE EXCESS ARTERIAL: -11.1 — ABNORMAL LOW (ref -2.0–2.0)
CALCIUM IONIZED ARTERIAL (MG/DL): 3.78 mg/dL — ABNORMAL LOW (ref 4.40–5.40)
CALCIUM IONIZED ARTERIAL (MG/DL): 3.5 mg/dL — ABNORMAL LOW (ref 4.40–5.40)
CALCIUM IONIZED ARTERIAL (MG/DL): 3.32 mg/dL — ABNORMAL LOW (ref 4.40–5.40)
FIO2 ARTERIAL: 50
FIO2 ARTERIAL: 50
GLUCOSE WHOLE BLOOD: 131 mg/dL
GLUCOSE WHOLE BLOOD: 47 mg/dL
GLUCOSE WHOLE BLOOD: 69 mg/dL
HCO3 ARTERIAL: 25 mmol/L (ref 22–27)
HCO3 ARTERIAL: 20 mmol/L — ABNORMAL LOW (ref 22–27)
HEMOGLOBIN BLOOD GAS: 7.4 g/dL — ABNORMAL LOW (ref 13.50–17.50)
HEMOGLOBIN BLOOD GAS: 9.4 g/dL — ABNORMAL LOW (ref 13.50–17.50)
HEMOGLOBIN BLOOD GAS: 8.6 g/dL — ABNORMAL LOW (ref 13.50–17.50)
LACTATE BLOOD ARTERIAL: 6.4 mmol/L (ref <=1.2)
LACTATE BLOOD ARTERIAL: 11.7 mmol/L (ref <=1.2)
O2 SATURATION ARTERIAL: 94.1 % (ref 94.0–100.0)
PCO2 ARTERIAL: 34.6 mmHg — ABNORMAL LOW (ref 35.0–45.0)
PCO2 ARTERIAL: 43.8 mmHg (ref 35.0–45.0)
PCO2 ARTERIAL: 51 mmHg — ABNORMAL HIGH (ref 35.0–45.0)
PH ARTERIAL: 7.47 — ABNORMAL HIGH (ref 7.35–7.45)
PH ARTERIAL: 7.29 — ABNORMAL LOW (ref 7.35–7.45)
PO2 ARTERIAL: 90.1 mmHg (ref 80.0–110.0)
PO2 ARTERIAL: 100 mmHg (ref 80.0–110.0)
POTASSIUM WHOLE BLOOD: 6.2 mmol/L (ref 3.4–4.6)
POTASSIUM WHOLE BLOOD: 7 mmol/L (ref 3.4–4.6)
POTASSIUM WHOLE BLOOD: 7.5 mmol/L (ref 3.4–4.6)
SODIUM WHOLE BLOOD: 146 mmol/L — ABNORMAL HIGH (ref 135–145)
SODIUM WHOLE BLOOD: 144 mmol/L (ref 135–145)

## 2018-08-21 LAB — COMPREHENSIVE METABOLIC PANEL
ALBUMIN: 2.2 g/dL — ABNORMAL LOW (ref 3.5–5.0)
ALKALINE PHOSPHATASE: 241 U/L — ABNORMAL HIGH (ref 38–126)
ALT (SGPT): 11561 U/L — ABNORMAL HIGH (ref 19–72)
AST (SGOT): 10462 U/L — ABNORMAL HIGH (ref 19–55)
BLOOD UREA NITROGEN: 126 mg/dL — ABNORMAL HIGH (ref 7–21)
BUN / CREAT RATIO: 44
CALCIUM: 6.6 mg/dL — CL (ref 8.5–10.2)
CHLORIDE: 107 mmol/L (ref 98–107)
CO2: 23 mmol/L (ref 22.0–30.0)
CREATININE: 2.86 mg/dL — ABNORMAL HIGH (ref 0.70–1.30)
EGFR CKD-EPI AA MALE: 23 mL/min/{1.73_m2} — ABNORMAL LOW (ref >=60)
EGFR CKD-EPI NON-AA MALE: 20 mL/min/{1.73_m2} — ABNORMAL LOW (ref >=60)
GLUCOSE RANDOM: 53 mg/dL — ABNORMAL LOW (ref 65–179)
POTASSIUM: 7.1 mmol/L (ref 3.5–5.0)
PROTEIN TOTAL: 4.1 g/dL — ABNORMAL LOW (ref 6.5–8.3)

## 2018-08-21 LAB — PCO2 VENOUS: Lab: 43

## 2018-08-21 LAB — FIBRINOGEN LEVEL
Lab: 131 — ABNORMAL LOW
Lab: 144 — ABNORMAL LOW
Lab: 151 — ABNORMAL LOW

## 2018-08-21 LAB — BLOOD GAS, ARTERIAL
HCO3 ARTERIAL: 23 mmol/L (ref 22–27)
O2 SATURATION ARTERIAL: 98.4 % (ref 94.0–100.0)
PH ARTERIAL: 7.36 (ref 7.35–7.45)
PO2 ARTERIAL: 216 mmHg — ABNORMAL HIGH (ref 80.0–110.0)

## 2018-08-21 LAB — PLATELET COUNT
Lab: 141 — ABNORMAL LOW
Lab: 151

## 2018-08-21 LAB — BLOOD GAS, VENOUS
HCO3 VENOUS: 27 mmol/L (ref 22–27)
O2 SATURATION VENOUS: 58.9 % (ref 40.0–85.0)
PH VENOUS: 7.4 (ref 7.32–7.43)
PO2 VENOUS: 36 mmHg (ref 30–55)

## 2018-08-21 LAB — APTT
Coagulation surface induced:Time:Pt:PPP:Qn:Coag: 33.4
Coagulation surface induced:Time:Pt:PPP:Qn:Coag: 36.6

## 2018-08-21 LAB — PROTIME-INR
INR: 2.38
INR: 2.48
INR: 2.41

## 2018-08-21 LAB — TROPONIN I: Troponin I.cardiac:MCnc:Pt:Ser/Plas:Qn:: 0.276

## 2018-08-21 LAB — LACTATE BLOOD ARTERIAL: Lactate:SCnc:Pt:BldA:Qn:: 6.4

## 2018-08-21 LAB — HCO3 ARTERIAL: Bicarbonate:SCnc:Pt:BldA:Qn:: 23

## 2018-08-21 LAB — INR: Lab: 2.41

## 2018-08-21 LAB — ALKALINE PHOSPHATASE: Alkaline phosphatase:CCnc:Pt:Ser/Plas:Qn:: 241 — ABNORMAL HIGH

## 2018-08-21 LAB — PH ARTERIAL: pH:LsCnc:Pt:BldA:Qn:: 7.29 — ABNORMAL LOW

## 2018-08-21 LAB — CO2: Carbon dioxide:SCnc:Pt:Ser/Plas:Qn:: 26

## 2018-08-21 LAB — PROTIME
Lab: 27.9 — ABNORMAL HIGH
Lab: 29 — ABNORMAL HIGH

## 2018-08-21 LAB — HEPARIN CORRELATION: Lab: 0.2

## 2018-08-21 LAB — HEMOGLOBIN: Lab: 9.3 — ABNORMAL LOW

## 2018-08-21 LAB — WBC ADJUSTED: Lab: 53

## 2018-08-21 LAB — O2 SATURATION ARTERIAL: Oxygen saturation:MFr:Pt:BldA:Qn:: 93.1 — ABNORMAL LOW

## 2018-08-21 LAB — HEMATOCRIT: Lab: 22.9 — ABNORMAL LOW

## 2018-08-21 NOTE — Unmapped (Signed)
Patient expired on vent, vent discontinued, ett removed per MD order

## 2018-08-21 NOTE — Unmapped (Signed)
BONE MARROW TRANSPLANT AND CELLULAR THERAPY CONSULT NOTE    Patient Name: Steven Fernandez  MRN: [id number]  Admit Date: 08/05/2018    BMT Attending MD: Dr. Lance Bosch    Disease: AML  Type of Transplant: MUD RIC Bu/Flu  Transplant Day: +522     HPI:  Steven Fernandez is a 77 y.o. male with a diagnosis of AML who is s/p MUD allogenic SCT (transplant 02/2017) who was admitted for progressive dyspnea over the preceding month but with acute worsening over the week prior to discharge. CT obtained about 1 week prior to admission was suggestive of possible interstitial pneumonitis. During this time he has been evaluated by Dr. 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     While outpatient evaluation plans were being developed, [name] respiratory status deteriorated, and he was prompted to call when he noticed that his home pulse oxygen began to decrease with readings in the high 80s. He also noted that he experienced a fever which was recorded as 101.6. He was encouraged to present to his closest ED. On initial evaluation he described the above events at noted that his appetite has been reduced. He also complaied of a dry cough. He otherwise has no other symptoms report denying any chest pain, pleuritis, nausea, vomiting, diarrhea, dysuria or hematuria. He has not had any recent sick contacts.       Interval history:   Yesterday, patient noted to have increased work of breathing.  Evaluation demonstrating a marked metabolic / lactic acidosis.  Intubation was pursued in this setting and patient suffered PEA arrest.  ACLS algorithm initiated with ROSC after 3 rounds of epinephrine with CPR.  Found to have recurrent tension pneumothorax, now decompressed, with placement of larger chest tube.  After resuscitation, patient with profound lactic acidosis, managed with ventilatory support, bicarbonate drip, with gradual improvement in acidemia overnight.  Despite this, patient with new pressor requirement yesterday afternoon, with fluctuating but generally increased requirement overnight.  He is currently on norepi, epinephrine, and vasopressin with escalating doses in the morning.    On our evaluation, patient is intubated and sedated.    Review of Systems:  A full system review was performed and was negative except as noted in the above interval history.    Temp:  [36.1 ??C (97 ??F)-36.2 ??C (97.2 ??F)] 36.1 ??C (97 ??F)  Core Temp:  [33.5 ??C (92.3 ??F)-37.5 ??C (99.5 ??F)] 37.2 ??C (99 ??F)  Heart Rate:  [88-132] 97  SpO2 Pulse:  [89-102] 92  Resp:  [15-41] 19  BP: (90-205)/(25-145) 138/75  MAP (mmHg):  [65-161] 97  A BP-2: (64-162)/(42-82) 107/61  MAP:  [51 mmHg-117 mmHg] 77 mmHg  FiO2 (%):  [40 %-100 %] 50 %  SpO2:  [92 %-100 %] 94 %    I/O last 3 completed shifts:  In: 5103.7 [I.V.:2314.2; Blood:522; NG/GT:120; IV Piggyback:2147.5]  Out: 725 [Urine:445; Chest Tube:280]  No intake/output data recorded.    Last 5 Recorded Weights    08/05/18 0550 08/05/18 1734   Weight: 65.8 kg (145 lb) 62.1 kg (136 lb 14.5 oz)     Weight change:     Test Results:   Reviewed in EPIC. Abnormal values discussed below.     Scheduled Meds:  ??? atovaquone  1,500 mg Oral Daily   ??? azithromycin  250 mg Oral Once per day on Mon Wed Fri   ??? carboxymethylcellulose sodium  2 drop Both Eyes 4x Daily   ???  Cefepime  1 g Intravenous Q12H [facility]   ??? cholecalciferol (vitamin D3)  1,000 Units Oral BID   ??? dexamethasone without alcohol  10 mg Oral Q6H [facility]   ??? famotidine  20 mg Oral Daily   ??? fluticasone propionate  2 puff Inhalation BID (RT)   ??? ibrutinib  140 mg Oral Daily   ??? flu vacc [id number] 6mos up(PF)  0.5 mL Intramuscular During hospitalization   ??? insulin lispro  0-12 Units Subcutaneous ACHS   ??? insulin NPH  5 Units Subcutaneous Q12H [facility]   ??? loratadine  10 mg Oral Daily   ??? methylPREDNISolone sodium succinate  125 mg Intravenous Daily   ??? montelukast  10 mg Oral Nightly   ??? posaconazole  300 mg Oral Daily   ??? ruxolitinib  10 mg Oral BID   ??? sodium chloride  10 mL Intravenous Q8H   ??? valACYclovir  500 mg Oral Daily     Continuous Infusions:  ??? EPINEPHrine 8 mcg/min (09/20/18 0705)   ??? fentaNYL citrate (PF) 150 mcg/hr (20-Sep-2018 0500)   ??? IP okay to treat     ??? norepinephrine bitartrate-NS 30 mcg/min (2018/09/20 0540)   ??? propofol 20 mcg/kg/min (09/20/2018 0500)   ??? sodium chloride     ??? sodium chloride 10 mL/hr at Sep 20, 2018 0500   ??? vasopressin 0.04 Units/min (09-20-2018 0500)     PRN Meds:.acetaminophen, albuterol, dextrose, dibucaine, diphenhydrAMINE, EPINEPHrine IM, famotidine (PEPCID) IV, fentaNYL (PF), hydrOXYzine, IP okay to treat, lactulose, melatonin, meperidine, methylPREDNISolone sodium succinate (PF), ondansetron, phenol, polyethylene glycol, senna, sodium chloride, sodium chloride 0.9%    Physical Exam:   General: Intubated, sedated  Neck: Right sided pheresis catheter- no erythema or drainage  Cardiovascular: RRR, cool extremities  Lungs: Mechanical breath sounds on anterior exam  Skin: Warm, dry, intact. No rash noted.   Psychiatry: Sedated  Abdomen: No distension, soft  Extremeties: Trace lower extremity edema at ankles noted.  Neurologic: Sedated, cranial nerves not testing    08/08/18 Transbronchial biopsy:  Final Diagnosis   A: Lung, right upper lobe, allograft, transbronchial biopsy  Two fragments of alveolated lung and two fragments of  Large airway wall. The lung shows mostly intra- alveolar fibroplasia (see note) and is negative for granuloma, tumor or any specific evidence of GVH.   The GMS stain is negative for pneumocystis or fungi however there is a detached fragment of tissue with a a few filamentous bacteria. I am not certain of the significance of these organisms but will do additional stains.  The airway wall shows increased goblet cells suggestive of chronic bronchitis   ??  Note: In the proper clinical and radiographic setting the histologic  features of intra-alveolar fibroplasia would be consistent with a diagnoisis of Bronchiolitis Obliterans Organizing Pneumonia (Organizing Bronchopneumonia or Cryptogenic Organizing Pneumonia) however it is generally unwise to diagnose BOOP on the basis of a transbronchial or needle core biopsy because of the difficulty in separating Primary BOOP (where BOOP is the only histologic process) from Secondary BOOP where BOOP is a component of some other specific histologic diagnosis)       CXR 08/18/18: unchanged L pnrumothorax    08/07/18 CTA Chest:  No pulmonary embolism.    Findings are concerning for acute on chronic graft versus host disease given the progressive cystic changes since recent outside CT from 5 days ago, with the patchy peripheral consolidations representing organizing pneumonia versus less likely multifocal pneumonia. Bronchoscopy and lavage would be beneficial if patient can tolerate the  procedure.      Assessment/Plan:   Mr. Stemm is a 77 y.o. male with a diagnosis of AML who is s/p MUD allogenic SCT (transplant 02/2017)  With RIC Bu/Flu condiitoning. 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 He is now admitted for dyspnea and hypoxia, found to have methemoglobinemia. The progressive cystic changes on chest CT noted on 9/10 increase our concern for pulmonary GVHD as the driver of his current hypoxic respiratory failure.      Other than this imaging, his evaluation thus far is notable for negative respiratory viral panel, negative CMV PCR obtained as outpatient on Friday, negative serum HSV / HHV6.  EBV PCR resulted in low-level positive result at 858 copies. Previously sent fungitell and aspergillus antigen assays negative.  Bronchoscopic infectious studies negative, notably including PJP DFA.  Pathology results from transbronchial biopsy are unfortunately non-specific, though the preponderance of evidence, including non-specific but compatible pathology, imaging results, and extensive negative infectious evaluation with bronchoscopy, support steroid-refractory acute GVHD superimposed on steroid-refractory chronic GVHD as driver of hypoxic respiratory failure.    In light of his profound and worsening shock, we discussed grim prognosis with Mr. Scarfo family (wife & sister in person and son over the phone).  The patient's son asked several questions about other interventions for immunologic processes such as CRS, though we discussed with him that the primary treatment for those processes is steroids that Mr. Gaymon is already receiving and that CRS would not be expected in this clinical scenario.  We instead favor ongoing worsening of pulmonary fibrosis related to GVHD with a likely viral infectious trigger that we cannot detect despite extensive negative evaluation and which would have no specific treatment modality beyond the agressive support Mr. Tennell is already receiving.    We reached out to Dr. Lance Bosch, Mr. [place] primary transplant attending, to update him regarding his clinical deterioration.    Patient's pressors were subsequently discontinued per ongoing discussions between ICU team and family and Mr. Jent passed away on 11-Sep-2018.    It was a pleasure to assist in the care of Mr. Crutchfield and his family.      Patient was seen and examined with inpatient BMT attending, Dr. Norlene Campbell.    Laurian Brim, MD  Hematology and Oncology Fellow

## 2018-08-21 NOTE — Unmapped (Signed)
Patients wob increased significantly today resulting in intubation. Patient placed on vent with ordered settings. Starting to improve as the shift goes on.

## 2018-08-21 NOTE — Unmapped (Signed)
Multiple discussions w/ son and wife today, with decision made to withdraw care in the setting of increasing pressor requirement, continued and worsening hyperkalemia, and worsening lactate. Pt taken off pressors but remained intubated until his passed.

## 2018-08-21 NOTE — Unmapped (Signed)
Physician Discharge Summary    Identifying Information:   Steven Fernandez  01-23-1941  [id number]    Admit date: 08/05/2018    Discharge date: 09-Sep-2018     Discharge Service: Medical ICU (MDI)    Discharge Attending Physician: Jettie Booze, MD    Discharge to: Deceased -  Steven Fernandez had a pronouncement of death September 09, 2018 and the time of death is 1334 . The pronouncement of death was made by Caesar Chestnut, NP.   The events prior to death were increased pressor requirements, worsening lactic acidosis, and worsening renal failure resulting in hyperkalemia.  The parties present at time of death was/were family. The patient's HCPOA was notified of the death. This case was not referred to the medical examiner. An autopsy was declined.    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 Infection given leukocytosis and fever. Infectious work up has been negative thus far, however patient with worsening respiratory status. Decided to start IV steroids for treatment of BOOP. Possible component of methemoglobinemia on O2 sats, should obtain ABGs for most accurate information.   - cont wean HFNC as tol  - Continue flluticasone, azithro, montelukast (FAM)  - Continue PRN duonebs  - 9/11: intubated for bronch for d/t progressive cystic changes on CT chest 9/10 concerning for aucte on chronic GVHD (no evid of PE).  Extubated early am on 9/12.  Initially on HFNC 50% 45L, now increased to 80-100% FiO2  - Abx as below  - ICID following   - Heme-onc following              - inc solumedrol 2mg /kg IV (125 mg) x 2 weeks, azitro Mon/wed/friday after CAP tx >>completed              - fluticasone INH 2very 12 hrs              - montelukast q hs              - cont atovaquone for PJP ppx              - Continue Jakafi 10 mg bid               - Started ibrutinib 9/20    L PTX: progressive tachypnea and wob, abg unchanged, cxr revealed large spont ptx  - IP placed emergent pigtail 9/16, tolerated well w versed and fent  - f/u CXR w successful evacuation of ptx, symptoms immed improved  9/18: - conts with airleak.  On -20 CXR this am with small apical PTX.  Will repeat CXR at 2pm  9/19: slightly worsening yesterday and placed on -40 cm.  Again slightly worse today.   Will try off HFNC and only NRB to see if less flow stabilizes PTX.  Appreciate IP's assistance.    9/20: slightly better off HFNC.  But conts on -40 cm.    9/21: stable, cont  to -40  9/22: Inc size ptx while  On HFNC, dec size on [facility]   9/23: Increased again this AM w/ clogged pigtail. Will change out for larger CT.     Pt had cardiac arrest during intubation on 9/24 in the setting of worsening lactic acidosis and pH of 7.0, requiring 4 rounds of CPR and code meds. Tension pneumothorax relieved by needle decompression, and ROSC achieved shortly after.  His pressor requirement continued to climb following the code, his AKI worsened, his LFTs continued to climb. His lactate initially improved somewhat, but again worsened. His pressor requirement on the morning of his death continued to climb. After his sisters had arrived and spent some time with him, decision was made to withdraw pressors. He died shortly after. Family updated.         ??Procedures:  internal jugular line  No admission procedures for hospital encounter.  _____________________________________________________________________________  Discharge Day Services:  BP 138/75  - Pulse 86  - Temp 36.1 ??C (97 ??F) (Axillary)  - Resp 25  - Ht 170.2 cm (5' 7.01)  - Wt 62.1 kg (136 lb 14.5 oz)  - SpO2 94%  - BMI 21.44 kg/m??   Pt seen on the day of discharge and determined appropriate for discharge.    Condition at Discharge: deceased    Length of Discharge: I spent less than 30 mins in the discharge of this patient.  _____________________________________________________________________________  Discharge Medications:     Your Medication List      START taking these medications    IMBRUVICA 140 mg Cap capsule  Generic drug:  ibrutinib  Take 1 capsule (140 mg total) by mouth daily.        ASK your doctor about these medications    albuterol 90 mcg/actuation inhaler  Commonly known as:  PROVENTIL HFA;VENTOLIN HFA  Inhale 2 puffs every six (6) hours as needed for wheezing.     azithromycin 500 MG tablet  Commonly known as:  ZITHROMAX  Take 2 tablets on day 1, then one tablet per day x 4 additional days     calcium carbonate 200 mg calcium (500 mg) chewable tablet  Commonly known as:  TUMS  Chew 2 tablets daily.     carboxymethylcellulose sodium 0.25 % Drop  Commonly known as:  THERATEARS  Administer 2 drops to both eyes Four (4) times a day.     cholecalciferol (vitamin D3) 1,000 unit (25 mcg) tablet  Take 1 tablet (1,000 Units total) by mouth Two (2) times a day.     dapsone 100 MG tablet  TAKE 1 TABLET BY MOUTH ONCE DAILY     dexAMETHasone 0.5 mg/5 mL solution  Commonly known as:  DECADRON  Rinse mouth with 5 mL of salt water.  Swish 5 mL of diluted dexamethasone mouth rinse 3 x a day for 3 - 5 minutes and SPIT.     famotidine 20 MG tablet  Commonly known as:  PEPCID  Take 2 tablets (40 mg total) by mouth daily at 10am.     loratadine 10 mg tablet  Commonly known as:  CLARITIN  Take 1 tablet (10 mg total) by mouth daily.     predniSONE 1 MG tablet  Commonly known as:  DELTASONE  Take 4 tablets (4 mg total) by mouth daily. Take 4 mg/d for one month and then decrease by one mg a day every month     rifAXIMin 550 mg Tab  Commonly known as:  XIFAXAN  Take  1 tablet (550 mg total) by mouth Three (3) times a day. for 14 days  Ask about: Should I take this medication?     triamcinolone 0.1 % cream  Commonly known as:  KENALOG  Apply 1 application topically daily at 0600.     valACYclovir 500 MG tablet  Commonly known as:  VALTREX  Take 1 tablet (500 mg total) by mouth daily.          _____________________________________________________________________________  Pending Test Results (if blank, then none):   Order Current Status    Type and Screen Collected (08/20/18 2014)    Blood Culture, Adult In process    Blood Culture, Adult In process    AFB culture Preliminary result    Fungal Culture Preliminary result          Most Recent Labs:  Microbiology Results (last day)     ** No results found for the last 24 hours. **          Lab Results   Component Value Date    WBC 53.0 (HH) 09-20-2018    HGB 9.3 (L) 09-20-2018    HCT 27.7 (L) 09/20/18    PLT 151 2018/09/20       Lab Results   Component Value Date    NA 144 2018/09/20    K 7.5 (HH) 2018/09/20    CL 107 20-Sep-2018    CO2 23.0 09-20-2018    BUN 126 (H) September 20, 2018    CREATININE 2.86 (H) 2018/09/20    CALCIUM 6.6 (LL) 09-20-2018    MG 2.6 (H) 08/20/2018    PHOS 10.4 (H) 08/20/2018       Lab Results   Component Value Date    ALKPHOS 241 (H) 20-Sep-2018    BILITOT 3.0 (H) 09-20-2018    BILIDIR 0.30 08/10/2018    BILIDIR 0.30 08/10/2018    PROT 4.1 (L) 09/20/18    ALBUMIN 2.2 (L) 2018-09-20    ALT 11,561 (H) 2018-09-20    AST 10,462 (H) 20-Sep-2018    GGT 33 06/24/2017       Lab Results   Component Value Date    PT 28.2 (H) 09/20/18    INR 2.41 09-20-18    APTT 39.5 09-20-18                   Appointments which have been scheduled for you    Oct 15, 2018 10:30 AM EST  (Arrive by 10:00 AM)  RETURN  GENERAL with Carmon Ginsberg, MD  [facility] [place] [facility]) [address]  [place] [address]  [phone]             Caesar Chestnut, NP

## 2018-08-21 NOTE — Unmapped (Signed)
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 Diff s/p fecal transplant, restrictive lung disease who developed left sided tension pneumothorax after being placed on non-invasive positive pressure ventilation. He is s/p pigtail chest tube placement 08/13/18 with improvement in hemodynamics but persistent pneumothorax which worsened anytime positive pressure ventilation was used (including high flow nasal cannula). He had worsening respiratory status 2018/08/26 and altered mentation so was intubated and had a 30F large-bore chest tube placed the same day.     Pneumothorax improved after suction was increased to -40 cm H2O early this morning. He remains on pressor support. We will continue current chest tube at -40 cm H2O suction and monitor pneumothorax with daily chest X-rays.    Plan:     1. Continue 30F chest tube to -40 cm H2O suction and obtain daily chest X-ray while chest tube is in place.    2. No indication for placement of a larger chest tube at this time.    Please call the Interventional Pulmonary fellow at [phone] with any questions.    Deagan Sevin R. Maple Mirza, DO  Clinical Instructor  Interventional Pulmonary Fellow  Division of Pulmonary Diseases and Critical Care Medicine  Pager [phone]    Subjective:     S: Patient had expansion of left pneumothorax overnight which improved with increasing suction. He remains on significant pressor support.    Objective:     Physical Examination:     BP 138/75  - Pulse 97  - Temp 36.1 ??C (97 ??F) (Axillary)  - Resp 19  - Ht 170.2 cm (5' 7.01)  - Wt 62.1 kg (136 lb 14.5 oz)  - SpO2 94%  - BMI 21.44 kg/m??    General appearance - sedated on ventilator  Heart - no murmurs noted  Chest - coarse breath sounds diffusely  Abdomen - soft, nontender, nondistended, no masses or organomegaly  Extremities - No pedal edema, no clubbing or cyanosis    Labs and Imaging:    CXR 08-26-18: 06:51: Improvement in left pneumothorax.    CXR 26-Aug-2018 05:00: persistent left medial pneumothorax.

## 2018-08-21 NOTE — Unmapped (Signed)
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 *      Interval history: Steven Fernandez is a 77 y.o. 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 Now with increasing O2 requirements, on HFNC    24hr events:   Eventful past 24hrs, following code w/ increased pressor requirement and increasing over the course of the night.     Dispo: ICU    Neurological   #throat pain- improved 9/18  - could be GVHD.    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 Infection given leukocytosis and fever. Infectious work up has been negative thus far, however patient with worsening respiratory status. Decided to start IV steroids for treatment of BOOP. Possible component of methemoglobinemia on O2 sats, should obtain ABGs for most accurate information.   - cont wean HFNC as tol  - Continue flluticasone, azithro, montelukast (FAM)  - Continue PRN duonebs  - 9/11: intubated for bronch for d/t progressive cystic changes on CT chest 9/10 concerning for aucte on chronic GVHD (no evid of PE).  Extubated early am on 9/12.  Initially on HFNC 50% 45L, now increased to 80-100% FiO2  - Abx as below  - ICID following   - Heme-onc following   - inc solumedrol 2mg /kg IV (125 mg) x 2 weeks, azitro Mon/wed/friday after CAP tx >>completed   - fluticasone INH 2very 12 hrs   - montelukast q hs   - cont atovaquone for PJP ppx   - Continue Jakafi 10 mg bid    - Started ibrutinib 9/20   - consulted transfusion medicine for extracorporal photopheresis.n if needed. Awaiting oxygen stability.       COPD: 40 pack year smoking history.   - Continue home anoro ellipta  - Duonebs PRN for wheezing  ??  OSA: Home CPAP QHS, uses 2L bleed in O2 at baseline just as of recent (last week) pulmonology appointment  - Home CPAP on hold     L PTX: progressive tachypnea and wob, abg unchanged, cxr revealed large spont ptx  - IP placed emergent pigtail 9/16, tolerated well w versed and fent  - f/u CXR w successful evacuation of ptx, symptoms immed improved  9/18: - conts with airleak.  On -20 CXR this am with small apical PTX.  Will repeat CXR at 2pm  9/19: slightly worsening yesterday and placed on -40 cm.  Again slightly worse today.   Will try off HFNC and only NRB to see if less flow stabilizes PTX.  Appreciate IP's assistance.    9/20: slightly better off HFNC.  But conts on -40 cm.    9/21: stable, cont to -40  9/22: Inc size ptx while  On HFNC, dec size on [facility]  9/23: Increased again this AM w/ clogged pigtail. Will change out for larger CT  9/24: re-expansion overnight of pneumo, now improved on cxr    Cardiovascular   HTN: likely 2/2 steroids  - hemodynamics improved after chest tube placed  - cont Coreg 25mg  bid  - added norvasc  9/19: lower BP yesterday and meds d/c'd. Improved BP again today.  If needed, will add back coreg.    9/20-9/21: BP stable    Hypotension:  - following code 9/23  - pressor requirement increasing, now on norepi, vaso, epi      Renal   CKD: Baseline 1.5-1.7, elevated to 1.9 on presentation, but now within baseline  - adeq uop, voiding   - goal euvolemia   9/19-9/21: cr stable.  Good uop.    9/23: Increased again this morning, but still near baseline  9/24: AKI worsening in setting of code, pressors    Hyperkalemia:  - worsening K, continuing to shift  - Will not proceed w/ CRRT in the setting of his worsening pressor requirement    Infectious Disease/Autoimmune   Fever in immunocompromised host, elevated EBV PCR: Inflammatory from GVHD vs. Infectious fever? Infectious work up negative thus far, with several pending studies. Started high dose IV steroids.  - Lower respiratory culture  - Viral PCRs: EBV, HSV, HHV6  - F/u BCx, UCx >> ngtd x 2, resp sample inadeq  - Antimicrobials:              - Linezolid (9/8 - 9/11)              - Cefepime (9/8 -9/11 ) (9/23- )              - Azithromycin (9/6 - 9/16)    - ceftriaxone (9/11-->9/17)   - cefdinir (9/18-->)  - PPx: valtrex for ppx, - Voriconazole (9/9 -9/10) changed to posaconazole 9/10 for ppx, azithro m/w/f  - BAL cx: neg.  Fungal cx neg,  f/u additional studies    FEN/GI   Hx of SIBO: pt reports not taking rifaximin  ??  Hx of recurrent C Diff s/p Fecal Transplant: Stooling currently at baseline, will follow closely while giving high dose antibiotics.  ??  Hx of [place] disease: f/u daily LFTs    Malnutrition Assessment: Not done yet.   - reg diet, poor appetite  - ordered      Heme/Coag   MDS-AML s/p BMT: s/p MUD allogenic SCT (transplant 02/2017)??with RIC Bu/Flu conditioning with post-transplant course complicated with severe C diff and cutaneous GVHD. There is now concern for pulmonary GVHD, although wondering if it is expected to occur with the more common forms of cutaneous/GI GVHD since other symptoms are not present.  - Primary transplant oncologist: Dr. Lance Bosch  - F/u with BMT team daily >> f/u weekly  Every mon/thursdays  - cont IV steroids for pulmonary GVHD: see pulm for GVHD plan  - CTA neg for PE  - SCDs  and heparin for DVT ppx      Methemoglobinemia 2/2 dapsone use: (Resolved) 11% at presentation, downtrending since discontinuation of dapsone. It is expected to lower O2 sat falsely due to altered binding of oxygen, will obtain ABGs for more accurate O2 levels.   - D/c'd dapsone  - Trend methemoglobin >> downtrend, poison control following,   - Will hold on methylene blue d/t c/f inducing methemoglobinemia given high O2 requirements. Will trial high dose Vit C as alternative. --improved and discontinued.    Dapsone changed  to atovaquone for PJP ppx  ??  Endocrine   H/o steroid induced hyperglycemia: Now on IV steroids, will monitor for hyperglycemia  - POCT glucose  - SSI    Prophylaxis/LDA/Restraints/Consults   Can CVC be removed? No, cont PICC for difficult access and blood draws, dialysis catheter for apheresis  Can A-line be removed? N/A, no A-line present  Can Foley be removed? N/A, no Foley present  Mobility plan: Step 5 - Bed to chair assessment    Feeding: Oral diet   Analgesia: No pain issues  Sedation SAT/SBT: N/A  Thrombembolic ppx: [place] hep  Head of bed >30 degrees: Yes  Ulcer ppx: Yes, home use continued  Glucose within target range: Yes, in range    RASS at goal? N/A, not on sedation  [place] Agitation Assessment Scale (RASS) : -1 (08/22/18 10:00 AM)     Can antipsychotics be stopped? N/A, not on antipsychotics  CAM-ICU Result: Positive (08-22-18  8:00 AM)      Patient Lines/Drains/Airways Status    Active Active Lines, Drains, & Airways     Name:   Placement date:   Placement time:   Site:   Days:    ETT  7.5   08/20/18    1120     less than 1    Apheresis Catheter 08/16/18   08/16/18    1600    Internal jugular   4    Chest Drainage System 2 Left Midaxillary;Fifth intercostal space 24 Fr.   08/20/18    1300    Midaxillary;Fifth intercostal space   less than 1    NG/OG Tube   08/20/18    1400    ???   less than 1    Urethral Catheter   08/20/18    1400    ???   less than 1    PICC Single Lumen 08/14/18 Right Basilic   08/14/18    1521    Basilic   6    Peripheral IV 16/10/96 Left Antecubital   08/22/2018    0000    Antecubital   less than 1    Arterial Line 08/20/18 Right Radial   08/20/18    1320    Radial   less than 1              Patient Lines/Drains/Airways Status    Active Wounds     Name:   Placement date:   Placement time:   Site:   Days:    Wound 08/05/18 Skin Tear Arm Left small skin tear near elbow   08/05/18    1800    Arm   15                Goals of Care     Code Status: Full Code    Designated Healthcare Decision Maker:  Mr. Crumble currently has decisional capacity for healthcare decision-making and is able to designate a surrogate healthcare decision maker. Mr. Tassin designated healthcare decision maker(s) is/are Ronav Furney (the patient's spouse) as denoted by stated patient preference.      Subjective     Synchronous w/ vent, appears comfortable.     Objective     Vitals - past 24 hours  Heart Rate:  [88-132] 92  SpO2 Pulse:  [89-102] 92  Resp:  [15-41] 16  BP: (90-205)/(25-145) 138/75  FiO2 (%):  [40 %-100 %] 50 %  SpO2:  [92 %-100 %] 94 % Intake/Output  I/O last 3 completed shifts:  In: 5103.7 [I.V.:2314.2; Blood:522; NG/GT:120; IV Piggyback:2147.5]  Out: 725 [Urine:445; Chest Tube:280]     Physical Exam:    General: Chronically ill appearing, deconditioned   HEENT: Atraumatic, normocephalic, EOMI, MMM  CV: RRR, no murmurs  Pulm: coarse breath sounds bilaterally AP,  L pigtail- C/d/I at site. Large CT site w/ some blood drainage.  With air leak  GI: soft, nontender, nondistended, +BS  MSK: moves all four extremities spontaneously, no edema  Skin: no rash or jaundice, no cutaneous manifestations of GVHD expect small area on L arm  Neuro: No focal deficits      Continuous Infusions:   ??? EPINEPHrine 16 mcg/min (31-Aug-2018 0810)   ??? fentaNYL citrate (PF) 150 mcg/hr (Aug 31, 2018 0800)   ??? IP okay to treat     ??? norepinephrine bitartrate-NS 30 mcg/min (08-31-18 0948)   ??? propofol 20.129 mcg/kg/min (2018-08-31 0800)   ??? sodium chloride     ??? sodium chloride 10 mL/hr at 08-31-18 0800   ??? vasopressin 0.04 Units/min (2018/08/31 0800)       Scheduled Medications:   ??? albumin human  50 g Intravenous Once   ??? atovaquone  1,500 mg Oral Daily   ??? azithromycin 250 mg Oral Once per day on Mon Wed Fri   ??? carboxymethylcellulose sodium  2 drop Both Eyes 4x Daily   ??? Cefepime  1 g Intravenous Q12H [facility]   ??? cholecalciferol (vitamin D3)  1,000 Units Oral BID   ??? dexamethasone without alcohol  10 mg Oral Q6H SCH   ??? famotidine  20 mg Oral Daily   ??? fluticasone propionate  2 puff Inhalation BID (RT)   ??? ibrutinib  140 mg Oral Daily   ??? flu vacc [id number] 6mos up(PF)  0.5 mL Intramuscular During hospitalization   ??? insulin lispro  0-12 Units Subcutaneous ACHS   ??? loratadine  10 mg Oral Daily   ??? methylPREDNISolone sodium succinate  125 mg Intravenous Daily   ??? montelukast  10 mg Oral Nightly   ??? posaconazole  300 mg Oral Daily   ??? ruxolitinib  10 mg Oral BID   ??? sodium chloride  10 mL Intravenous Q8H   ??? valACYclovir  500 mg Oral Daily       PRN medications:  acetaminophen, albuterol, dextrose, dibucaine, diphenhydrAMINE, EPINEPHrine IM, famotidine (PEPCID) IV, fentaNYL (PF), hydrOXYzine, IP okay to treat, lactulose, melatonin, meperidine, methylPREDNISolone sodium succinate (PF), ondansetron, phenol, polyethylene glycol, senna, sodium chloride, sodium chloride 0.9%    Data/Imaging Review: Reviewed in Epic and personally interpreted on Aug 31, 2018. See EMR for detailed results.      Critical Care Attestation     This patient is critically ill or injured with the impairment of vital organ systems such that there is a high probability of imminent or life threatening deterioration in the patient's condition. This patient must remain in the ICU for ongoing evaluation of the comprehensive management plan outlined in this note. I directly provided critical care services as documented in this note and the critical care time spent (45 min) is exclusive of separately billable procedures.    Daryel November, ACNP

## 2018-08-21 NOTE — Unmapped (Signed)
Pt was agitated, tachypneic and expressed having difficulty breathing at start of shift. Pt continued to display these signs and symptoms despite being on high flow at 40% and a non-re breather. Pt loss color and intubation was attempted. 1st attempt failed and pt loss pulses. Compressions were started. Code started at approximately 1130 am. 4 rounds of compressions performed, 3 rounds of EPI, 4 rounds of Bicarb given. Airway established, pt regained color and pulses were palpable.    Pt now following simple commands, but still drowsy. Pt expressed having pain, 4 mg of Morphine given. Norepi @22  mcg/min, Vaso @0 .04 units/min, and currently on a bicarb drip.       Problem: Adult Inpatient Plan of Care  Goal: Plan of Care Review  Outcome: Progressing  Goal: Patient-Specific Goal (Individualization)  Outcome: Progressing  Goal: Absence of Hospital-Acquired Illness or Injury  Outcome: Progressing  Goal: Optimal Comfort and Wellbeing  Outcome: Progressing  Goal: Readiness for Transition of Care  Outcome: Progressing  Goal: Rounds/Family Conference  Outcome: Progressing     Problem: Gas Exchange Impaired  Goal: Optimal Gas Exchange  Outcome: Progressing     Problem: Fall Injury Risk  Goal: Absence of Fall and Fall-Related Injury  Outcome: Progressing     Problem: Venous Thromboembolism  Goal: VTE (Venous Thromboembolism) Symptom Resolution  Outcome: Progressing     Problem: Breathing Pattern Ineffective  Goal: Effective Breathing Pattern  Outcome: Progressing     Problem: Obstructive Sleep Apnea Risk or Actual (Comorbidity Management)  Goal: Unobstructed Breathing During Sleep  Outcome: Progressing     Problem: Skin Injury Risk Increased  Goal: Skin Health and Integrity  Outcome: Progressing     Problem: Self-Care Deficit  Goal: Improved Ability to Complete Activities of Daily Living  Outcome: Progressing     Problem: Anxiety  Goal: Anxiety Reduction or Resolution  Outcome: Progressing     Problem: Noninvasive Ventilation Acute  Goal: Effective Unassisted Ventilation and Oxygenation  Outcome: Progressing

## 2018-08-23 ENCOUNTER — Telehealth: Payer: Self-pay | Admitting: Pulmonary Disease

## 2018-08-23 NOTE — Telephone Encounter (Signed)
lmtcb x1 pt's wife, Silva Bandy.

## 2018-08-23 NOTE — Telephone Encounter (Signed)
Silva Bandy returned call, CB is [phone].  She asked we leave message if she does not answer.

## 2018-08-23 NOTE — Telephone Encounter (Signed)
I'm sorry to hear this.  

## 2018-08-23 NOTE — Telephone Encounter (Signed)
Forwarding to BQ as FYI

## 2018-08-24 ENCOUNTER — Ambulatory Visit: Payer: Medicare Other | Admitting: Pulmonary Disease

## 2018-08-28 DEATH — deceased

## 2018-08-29 ENCOUNTER — Ambulatory Visit: Payer: Medicare Other | Admitting: Pulmonary Disease

## 2019-02-04 NOTE — Unmapped (Signed)
BMT NOTE: OK TO DISCARD REMAINING  CRYOPRESERVED  STEM CELL PRODUCTS    As the primary BMT provider for this patient, I hereby authorize the discarding of the remaining stem cell products that have been stored at [facility] in the [facility].        Lia Hopping, MD, MS  Professor  Hematology/Oncology [facility]

## 2019-04-02 ENCOUNTER — Encounter

## 2020-03-03 NOTE — Telephone Encounter (Signed)
discard
